# Patient Record
Sex: Female | Born: 1965 | Race: White | Hispanic: No | Marital: Married | Smoking: Never smoker
Health system: Southern US, Community
[De-identification: ages and names within clinical notes are randomized; demographics above are authoritative.]

## PROBLEM LIST (undated history)

## (undated) DIAGNOSIS — R112 Nausea with vomiting, unspecified: Secondary | ICD-10-CM

## (undated) DIAGNOSIS — M5030 Other cervical disc degeneration, unspecified cervical region: Secondary | ICD-10-CM

## (undated) DIAGNOSIS — F32A Depression, unspecified: Secondary | ICD-10-CM

## (undated) DIAGNOSIS — K219 Gastro-esophageal reflux disease without esophagitis: Secondary | ICD-10-CM

## (undated) DIAGNOSIS — K863 Pseudocyst of pancreas: Secondary | ICD-10-CM

## (undated) DIAGNOSIS — K5900 Constipation, unspecified: Secondary | ICD-10-CM

## (undated) DIAGNOSIS — N3010 Interstitial cystitis (chronic) without hematuria: Secondary | ICD-10-CM

## (undated) DIAGNOSIS — N390 Urinary tract infection, site not specified: Secondary | ICD-10-CM

## (undated) DIAGNOSIS — S99191A Other physeal fracture of right metatarsal, initial encounter for closed fracture: Secondary | ICD-10-CM

## (undated) DIAGNOSIS — J40 Bronchitis, not specified as acute or chronic: Secondary | ICD-10-CM

## (undated) DIAGNOSIS — Z87442 Personal history of urinary calculi: Secondary | ICD-10-CM

## (undated) DIAGNOSIS — S8262XA Displaced fracture of lateral malleolus of left fibula, initial encounter for closed fracture: Secondary | ICD-10-CM

## (undated) DIAGNOSIS — N201 Calculus of ureter: Secondary | ICD-10-CM

## (undated) DIAGNOSIS — R109 Unspecified abdominal pain: Secondary | ICD-10-CM

## (undated) DIAGNOSIS — M542 Cervicalgia: Secondary | ICD-10-CM

## (undated) DIAGNOSIS — K589 Irritable bowel syndrome without diarrhea: Secondary | ICD-10-CM

## (undated) DIAGNOSIS — R110 Nausea: Secondary | ICD-10-CM

## (undated) DIAGNOSIS — M25561 Pain in right knee: Secondary | ICD-10-CM

## (undated) DIAGNOSIS — F329 Major depressive disorder, single episode, unspecified: Secondary | ICD-10-CM

## (undated) DIAGNOSIS — D649 Anemia, unspecified: Secondary | ICD-10-CM

## (undated) DIAGNOSIS — E039 Hypothyroidism, unspecified: Secondary | ICD-10-CM

## (undated) DIAGNOSIS — F419 Anxiety disorder, unspecified: Secondary | ICD-10-CM

## (undated) DIAGNOSIS — K861 Other chronic pancreatitis: Secondary | ICD-10-CM

## (undated) DIAGNOSIS — R0600 Dyspnea, unspecified: Secondary | ICD-10-CM

## (undated) DIAGNOSIS — Z8719 Personal history of other diseases of the digestive system: Secondary | ICD-10-CM

## (undated) DIAGNOSIS — J189 Pneumonia, unspecified organism: Secondary | ICD-10-CM

## (undated) DIAGNOSIS — M25461 Effusion, right knee: Secondary | ICD-10-CM

## (undated) DIAGNOSIS — Z9889 Other specified postprocedural states: Secondary | ICD-10-CM

## (undated) HISTORY — PX: JOINT REPLACEMENT: SHX530

## (undated) HISTORY — DX: Depression, unspecified: F32.A

## (undated) HISTORY — DX: Major depressive disorder, single episode, unspecified: F32.9

## (undated) HISTORY — PX: OTHER SURGICAL HISTORY: SHX169

## (undated) HISTORY — PX: TONSILLECTOMY: SUR1361

## (undated) HISTORY — PX: ABDOMINAL HYSTERECTOMY: SHX81

## (undated) HISTORY — DX: Gastro-esophageal reflux disease without esophagitis: K21.9

## (undated) HISTORY — PX: HERNIA REPAIR: SHX51

---

## 1990-03-13 HISTORY — PX: DIAGNOSTIC LAPAROSCOPY: SUR761

## 1995-03-14 HISTORY — PX: LAPAROSCOPIC ASSISTED VAGINAL HYSTERECTOMY: SHX5398

## 1997-03-13 HISTORY — PX: CERVICAL FUSION: SHX112

## 1997-09-29 ENCOUNTER — Encounter: Admission: RE | Admit: 1997-09-29 | Discharge: 1997-12-28 | Payer: Self-pay | Admitting: Family Medicine

## 1997-10-21 ENCOUNTER — Ambulatory Visit (HOSPITAL_COMMUNITY): Admission: RE | Admit: 1997-10-21 | Discharge: 1997-10-21 | Payer: Self-pay | Admitting: Neurosurgery

## 1997-10-30 ENCOUNTER — Encounter: Payer: Self-pay | Admitting: Neurosurgery

## 1997-10-30 ENCOUNTER — Ambulatory Visit (HOSPITAL_COMMUNITY): Admission: RE | Admit: 1997-10-30 | Discharge: 1997-10-30 | Payer: Self-pay | Admitting: Neurosurgery

## 1997-11-18 ENCOUNTER — Ambulatory Visit (HOSPITAL_COMMUNITY): Admission: RE | Admit: 1997-11-18 | Discharge: 1997-11-18 | Payer: Self-pay | Admitting: Neurosurgery

## 1997-11-18 ENCOUNTER — Encounter: Payer: Self-pay | Admitting: Neurosurgery

## 1998-03-21 ENCOUNTER — Encounter: Payer: Self-pay | Admitting: Emergency Medicine

## 1998-03-21 ENCOUNTER — Emergency Department (HOSPITAL_COMMUNITY): Admission: EM | Admit: 1998-03-21 | Discharge: 1998-03-21 | Payer: Self-pay | Admitting: Emergency Medicine

## 1998-03-24 ENCOUNTER — Encounter: Payer: Self-pay | Admitting: Urology

## 1998-03-24 ENCOUNTER — Ambulatory Visit (HOSPITAL_COMMUNITY): Admission: RE | Admit: 1998-03-24 | Discharge: 1998-03-24 | Payer: Self-pay | Admitting: Urology

## 1998-06-23 ENCOUNTER — Ambulatory Visit (HOSPITAL_COMMUNITY): Admission: RE | Admit: 1998-06-23 | Discharge: 1998-06-23 | Payer: Self-pay | Admitting: Gastroenterology

## 1998-09-09 ENCOUNTER — Ambulatory Visit (HOSPITAL_COMMUNITY): Admission: RE | Admit: 1998-09-09 | Discharge: 1998-09-09 | Payer: Self-pay | Admitting: Obstetrics and Gynecology

## 1999-05-13 ENCOUNTER — Encounter: Payer: Self-pay | Admitting: Family Medicine

## 1999-05-13 ENCOUNTER — Ambulatory Visit (HOSPITAL_COMMUNITY): Admission: RE | Admit: 1999-05-13 | Discharge: 1999-05-13 | Payer: Self-pay | Admitting: Family Medicine

## 1999-06-16 ENCOUNTER — Encounter: Payer: Self-pay | Admitting: Family Medicine

## 1999-06-16 ENCOUNTER — Encounter: Admission: RE | Admit: 1999-06-16 | Discharge: 1999-06-16 | Payer: Self-pay | Admitting: Family Medicine

## 1999-08-19 ENCOUNTER — Encounter: Admission: RE | Admit: 1999-08-19 | Discharge: 1999-08-19 | Payer: Self-pay | Admitting: Family Medicine

## 1999-08-19 ENCOUNTER — Encounter: Payer: Self-pay | Admitting: Family Medicine

## 2000-05-02 ENCOUNTER — Emergency Department (HOSPITAL_COMMUNITY): Admission: EM | Admit: 2000-05-02 | Discharge: 2000-05-02 | Payer: Self-pay | Admitting: Emergency Medicine

## 2000-05-02 ENCOUNTER — Encounter: Payer: Self-pay | Admitting: Emergency Medicine

## 2002-10-18 ENCOUNTER — Emergency Department (HOSPITAL_COMMUNITY): Admission: AC | Admit: 2002-10-18 | Discharge: 2002-10-18 | Payer: Self-pay | Admitting: Emergency Medicine

## 2002-10-18 ENCOUNTER — Encounter: Payer: Self-pay | Admitting: Emergency Medicine

## 2003-07-08 ENCOUNTER — Encounter: Admission: RE | Admit: 2003-07-08 | Discharge: 2003-07-08 | Payer: Self-pay | Admitting: Family Medicine

## 2003-12-01 ENCOUNTER — Encounter: Admission: RE | Admit: 2003-12-01 | Discharge: 2003-12-01 | Payer: Self-pay | Admitting: Family Medicine

## 2009-03-03 ENCOUNTER — Encounter: Admission: RE | Admit: 2009-03-03 | Discharge: 2009-03-03 | Payer: Self-pay | Admitting: Advanced Practice Midwife

## 2009-06-11 HISTORY — PX: CERVICAL SPINE SURGERY: SHX589

## 2009-09-24 ENCOUNTER — Ambulatory Visit (HOSPITAL_BASED_OUTPATIENT_CLINIC_OR_DEPARTMENT_OTHER): Admission: RE | Admit: 2009-09-24 | Discharge: 2009-09-24 | Payer: Self-pay | Admitting: Urology

## 2009-09-24 HISTORY — PX: OTHER SURGICAL HISTORY: SHX169

## 2009-12-21 ENCOUNTER — Encounter (INDEPENDENT_AMBULATORY_CARE_PROVIDER_SITE_OTHER): Payer: Self-pay | Admitting: Obstetrics and Gynecology

## 2009-12-21 ENCOUNTER — Ambulatory Visit (HOSPITAL_COMMUNITY): Admission: RE | Admit: 2009-12-21 | Discharge: 2009-12-21 | Payer: Self-pay | Admitting: Obstetrics and Gynecology

## 2009-12-21 HISTORY — PX: OTHER SURGICAL HISTORY: SHX169

## 2010-02-07 ENCOUNTER — Encounter: Admission: RE | Admit: 2010-02-07 | Discharge: 2010-02-07 | Payer: Self-pay | Admitting: Obstetrics and Gynecology

## 2010-03-13 HISTORY — PX: BACK SURGERY: SHX140

## 2010-05-16 ENCOUNTER — Other Ambulatory Visit: Payer: Self-pay | Admitting: Orthopedic Surgery

## 2010-05-19 ENCOUNTER — Ambulatory Visit
Admission: RE | Admit: 2010-05-19 | Discharge: 2010-05-19 | Disposition: A | Discharge status: Home / Self Care | Payer: Managed Care, Other (non HMO) | Source: Ambulatory Visit | Attending: Orthopedic Surgery | Admitting: Orthopedic Surgery

## 2010-05-26 LAB — BASIC METABOLIC PANEL
BUN: 14 mg/dL (ref 6–23)
CO2: 27 mEq/L (ref 19–32)
Calcium: 9.3 mg/dL (ref 8.4–10.5)
Creatinine, Ser: 0.86 mg/dL (ref 0.4–1.2)
GFR calc non Af Amer: >60 mL/min (ref >60)
Glucose, Bld: 95 mg/dL (ref 70–99)

## 2010-05-26 LAB — CBC
Hemoglobin: 12 g/dL (ref 12.0–15.0)
MCH: 27.3 pg (ref 26.0–34.0)
MCHC: 32.9 g/dL (ref 30.0–36.0)

## 2010-05-26 LAB — SURGICAL PCR SCREEN: Staphylococcus aureus: NEGATIVE

## 2010-05-28 LAB — POCT HEMOGLOBIN-HEMACUE: Hemoglobin: 11.3 g/dL — ABNORMAL LOW (ref 12.0–15.0)

## 2010-07-14 ENCOUNTER — Other Ambulatory Visit: Payer: Self-pay | Admitting: Orthopedic Surgery

## 2010-07-14 DIAGNOSIS — M542 Cervicalgia: Secondary | ICD-10-CM

## 2010-07-18 ENCOUNTER — Ambulatory Visit
Admission: RE | Admit: 2010-07-18 | Discharge: 2010-07-18 | Disposition: A | Discharge status: Home / Self Care | Payer: Managed Care, Other (non HMO) | Source: Ambulatory Visit | Attending: Orthopedic Surgery | Admitting: Orthopedic Surgery

## 2010-07-18 DIAGNOSIS — M542 Cervicalgia: Secondary | ICD-10-CM

## 2010-10-28 ENCOUNTER — Encounter (HOSPITAL_COMMUNITY)
Admission: RE | Admit: 2010-10-28 | Discharge: 2010-10-28 | Disposition: A | Discharge status: Home / Self Care | Payer: 59 | Source: Ambulatory Visit | Attending: Orthopedic Surgery | Admitting: Orthopedic Surgery

## 2010-10-28 ENCOUNTER — Other Ambulatory Visit (HOSPITAL_COMMUNITY): Payer: Self-pay | Admitting: Orthopedic Surgery

## 2010-10-28 DIAGNOSIS — M545 Low back pain: Secondary | ICD-10-CM

## 2010-10-28 LAB — CBC
Hemoglobin: 11.7 g/dL — ABNORMAL LOW (ref 12.0–15.0)
MCH: 26.7 pg (ref 26.0–34.0)
MCV: 83.3 fL (ref 78.0–100.0)
RBC: 4.38 MIL/uL (ref 3.87–5.11)

## 2010-10-28 LAB — BASIC METABOLIC PANEL
BUN: 16 mg/dL (ref 6–23)
CO2: 30 mEq/L (ref 19–32)
GFR calc Af Amer: >60 mL/min (ref >60)
Potassium: 4.3 mEq/L (ref 3.5–5.1)
Sodium: 138 mEq/L (ref 135–145)

## 2010-10-28 LAB — SURGICAL PCR SCREEN: Staphylococcus aureus: NEGATIVE

## 2010-10-29 LAB — TYPE AND SCREEN
ABO/RH(D): B POS
Antibody Screen: NEGATIVE

## 2010-11-09 ENCOUNTER — Inpatient Hospital Stay (HOSPITAL_COMMUNITY): Payer: 59

## 2010-11-09 ENCOUNTER — Inpatient Hospital Stay (HOSPITAL_COMMUNITY)
Admission: RE | Admit: 2010-11-09 | Discharge: 2010-11-12 | DRG: 460 | Disposition: A | Discharge status: Home Health | Payer: 59 | Source: Ambulatory Visit | Attending: Orthopedic Surgery | Admitting: Orthopedic Surgery

## 2010-11-09 DIAGNOSIS — E039 Hypothyroidism, unspecified: Secondary | ICD-10-CM | POA: Diagnosis present

## 2010-11-09 DIAGNOSIS — E669 Obesity, unspecified: Secondary | ICD-10-CM | POA: Diagnosis present

## 2010-11-09 DIAGNOSIS — M4310 Spondylolisthesis, site unspecified: Secondary | ICD-10-CM

## 2010-11-09 DIAGNOSIS — M51379 Other intervertebral disc degeneration, lumbosacral region without mention of lumbar back pain or lower extremity pain: Principal | ICD-10-CM | POA: Diagnosis present

## 2010-11-09 DIAGNOSIS — M5137 Other intervertebral disc degeneration, lumbosacral region: Principal | ICD-10-CM | POA: Diagnosis present

## 2010-11-09 DIAGNOSIS — F341 Dysthymic disorder: Secondary | ICD-10-CM | POA: Diagnosis present

## 2010-11-09 DIAGNOSIS — Z01818 Encounter for other preprocedural examination: Secondary | ICD-10-CM

## 2010-11-09 DIAGNOSIS — Z01812 Encounter for preprocedural laboratory examination: Secondary | ICD-10-CM

## 2010-11-09 DIAGNOSIS — Q762 Congenital spondylolisthesis: Secondary | ICD-10-CM

## 2010-11-09 HISTORY — PX: ANTERIOR LUMBAR FUSION: SHX1170

## 2010-11-10 ENCOUNTER — Inpatient Hospital Stay (HOSPITAL_COMMUNITY): Payer: 59

## 2010-11-10 DIAGNOSIS — M79609 Pain in unspecified limb: Secondary | ICD-10-CM

## 2010-11-11 NOTE — Op Note (Signed)
NAMESONNI, BARSE               ACCOUNT NO.:  [id number]  MEDICAL RECORD NO.:  [id number]  LOCATION:  2550                         FACILITY:  [facility]  PHYSICIAN:  Alvy Beal, MD    DATE OF BIRTH:  Jul 31, 1965  DATE OF PROCEDURE:  11/09/2010 DATE OF DISCHARGE:                              OPERATIVE REPORT   PREOPERATIVE DIAGNOSIS:  Lumbar spondylolisthesis L5-S1 with degenerative disk disease.  POSTOPERATIVE DIAGNOSIS:  Lumbar spondylolisthesis L5-S1 with degenerative disk disease.  OPERATIVE PROCEDURE:  Anterior lumbar interbody fusion L5-S1.  INSTRUMENTATION SYSTEM USED:  RSV inner plate which is 14 large pole of the lordotic spacer packed with Cronos and PBX with appropriate-sized anterior 0 profile plate fixed with standard 20 mm lag screws and locking plate.  COMPLICATIONS:  None.  DISPOSITION:  Stable.  APPROACH SURGEON: 1. Charlena Cross, MD  FIRST ASSISTANT:  Norval Gable, PA  This is a very pleasant 45 year old woman with longstanding significant low back, buttock, and bilateral leg pain, right side worse than the left.  Despite appropriate prolonged conservative management, injection therapy, physiotherapy, analgesics, activity modification, she continued to have severe disabling low back pain, loss of quality of life.  As a result, we elected to proceed with aforementioned surgery.  All appropriate risks, benefits, and alternatives were discussed with the patient.  Consent was obtained.  OPERATIVE NOTE:  The patient was brought to the operating room, placed supine on the operating table.  After successful induction of general anesthesia and endotracheal intubation, TEDs, SCDs, and Foley were inserted.  The patient was placed supine on the [place] table.  All bony prominences were well-padded.  Abdomen was prepped and draped in standard fashion.  Dr. Durene Cal then did standard anterior approach, retroperitoneal approach to the lumbar spine.   Once the Orient blades were placed and the vasculature was retracted and the L5-S1 disk was exposed, I then scrubbed into the case.  A marker needle was placed at the L5-S1 disk. I confirmed the appropriate level.  Once this was done, I then incised the annulus with 10-blade scalpel.  Using a combination of pituitary rongeurs, curettes, and Kerrison rongeurs, I eventually performed a complete and thorough diskectomy.  There was significant posterior bone spur at L5 such that there was a retrolisthesis of L5 with respect to S1.  I was able to release the annulus from the posterior aspect of S1. I then took a 2-mm Kerrison underneath the posterior bone spur of L5 and annulus and released the annulus and removed the bone spur completely decompressing centrally and in the lateral gutters.  At this point, I had an excellent decompression.  I could visualize the remaining fragments of the posterior longitudinal ligament and annulus.  I cleared out laterally and I had to address the retrolisthesis.  I then rasped the endplates and then trialed interbody spacers.  Once I had the appropriate sized trial, I then aspirated 5 mL of blood from the S1 vertebral body and then packed the graft with Cronos mixed with PBX. This was then [facility] to the appropriate depth and then the zero profile plate was attached and secured with three screws and a locking plate according  to [employer] specifications.  At this point, I irrigated the wound copiously with normal saline and placed FloSeal, then used bipolar electrocautery to obtain hemostasis.  FloSeal was used to help maintain.  I sequentially removed the retractors and there was no significant bleeding noted.  I then closed the rectus sheath with #1 PDS running stitch and then a two-layer 2-0 Vicryl suture closure in an interrupted simple and 3-0 Monocryl for the skin.  Steri-Strips and dry dressing applied. Intraoperative AP x-ray of the abdomen  showed there was no surgical materials left in the wound other than the hardware.  Final x-rays were satisfactory.  At the end of the case, all needle and sponge counts were correct.  The patient was ultimately extubated, transferred to the PACU without incident.  It should be noted that prior to the onset of the case, we did do an appropriate time-out and all pertinent port data was confirmed.     Alvy Beal, MD     DDB/MEDQ  D:  11/09/2010  T:  11/09/2010  Job:  [id number]  cc:   Jorge Ny, MD  Electronically Signed by Venita Lick MD on 11/11/2010 03:51:08 PM

## 2010-12-10 NOTE — Op Note (Signed)
  [name], [name]               ACCOUNT NO.:  [id number]  MEDICAL RECORD NO.:  [id number]  LOCATION:  5035                         FACILITY:  [facility]  PHYSICIAN:  Juleen China IV, MDDATE OF BIRTH:  [birth year]  DATE OF PROCEDURE:  11/09/2010 DATE OF DISCHARGE:  11/12/2010                              OPERATIVE REPORT   PREOPERATIVE DIAGNOSIS:  Back pain.  POSTOPERATIVE DIAGNOSIS:  Back pain.  PROCEDURE PERFORMED:  Anterior exposure of the L5-S1 disk space.  SURGEON: 1. Charlena Cross, MD  ASSISTANTS:  Norval Gable, PA, Alvy Beal, MD  ANESTHESIA:  General.  BLOOD LOSS:  See anesthesia record.  COMPLICATIONS:  None.  INDICATIONS:  This is a 45 year old female seen preoperatively by Dr. Shon Baton and deemed to be a candidate for anterior exposure and instrumentation.  I met the patient in the holding area.  We discussed the risks and benefits of the anterior approach including the risk of injury to the artery, nerve and ureter.  She understands these and wishes to proceed.  PROCEDURE:  The patient was identified in the holding area and taken to room 5 and placed supine on the table.  General endotracheal anesthesia was administered.  The patient was prepped and draped in usual fashion. A time-out was called.  Antibiotics were given.  A C-arm was used to identify the appropriate level of the incision.  A transverse left lower quadrant incision was made.  Cautery was used to divide the subcutaneous tissue down to the anterior abdominal wall fascia.  The fascia was then opened with cautery.  Subfascial flaps were then developed.  The medial and lateral border of the rectus were mobilized.  I initially entered the retroperitoneal space from the lateral side of the rectus.  Once I had developed a good plane, I came from the medial side of the rectus. The abdominal contents were then swept medially and superiorly.  The external iliac artery and iliac vein were  identified.  The ureter was also visualized and swept laterally and protected.  The vein was fully mobilized and iliolumbar branches were divided between silk ties.  The median sacral artery and vein were identified and divided with bipolar cautery.  Once adequate mobilization of the vein was achieved, the [facility] retractor was set into place.  150 reverse lip blades were placed on either side of the spine.  Malleable retractors were used superiorly and inferiorly.  At this point, Dr. Shon Baton came in and performed his portion of the procedure.  Once he was completed, hemostasis was achieved and Dr. Shon Baton closed the incision.     Jorge Ny, MD     VWB/MEDQ  D:  11/15/2010  T:  11/16/2010  Job:  [id number]  Electronically Signed by Arelia Longest IV MD on 12/10/2010 09:56:44 AM

## 2010-12-14 NOTE — Discharge Summary (Signed)
[name], [name] NO.:  [id number]  MEDICAL RECORD NO.:  [id number]  LOCATION:  5035                         FACILITY:  [facility]  PHYSICIAN:  Alvy Beal, MD    DATE OF BIRTH:  Nov 05, 1965  DATE OF ADMISSION:  11/09/2010 DATE OF DISCHARGE:  11/12/2010                              DISCHARGE SUMMARY   ADMISSION DIAGNOSES: 1. Lumbar spondylolisthesis, L5 through S1 with degenerative disk     disease. 2. Gastroesophageal reflux disease. 3. Kidney stones. 4. Irritable bowel syndrome. 5. Depression/anxiety. 6. Hypothyroidism. 7. Endometriosis.  DISCHARGE DIAGNOSES: 1. Lumbar spondylolisthesis, L5-S1 with degenerative disk disease     status post anterior lumbar interbody fusion, L5-S1. 2. Gastroesophageal reflux disease. 3. Kidney stones. 4. Irritable bowel syndrome. 5. Depression/anxiety. 6. Hypothyroidism. 7. Endometriosis.  OPERATIVE PROCEDURE:  Anterior lumbar interbody fusion, L5-S1.  APPROACHED SURGEON: 1. Charlena Cross, MD  SURGEON:  Alvy Beal, MD  FIRST ASSISTANT:  Norval Gable, PA  COMPLICATIONS:  None.  CONSULTS:  None.  BRIEF HISTORY:  [name] is a pleasant 45 year old female with longstanding horrific low back, buttock, and bilateral leg pain, right side greater than left.  Despite appropriate conservative management which includes injection therapy, physiotherapy, analgesics, activity modification, and observation, she continued to have severe horrific disabling low back pain and loss of quality of life.  Because of the ongoing nature of her pain, she elected to proceed with surgery.  HOSPITAL COURSE:  On November 09, 2010, the patient was admitted to [facility] and underwent the above procedure without complication. She was transferred to the PACU and subsequently to the Orthopedic Floor in stable condition..  On postop day #1, the patient was doing well.  She complained only of incisional pain.  She was  afebrile and her vital signs were stable.  On exam, her neurovascular status was intact.  She has 5/5 strength in lower extremities and her sensation was intact to light touch.  The incision was clean, dry, and intact.  Her compartments were soft and nontender.  There was no shortness of breath or chest pain.  Her abdomen was soft and nontender.  X-rays were reviewed by Dr. Shon Baton and were satisfactory.  Bilateral lower extremity venous Dopplers were completed and there was no evidence of DVT.  On postop day #2, the patient was doing well, however, she still complained of incisional pain only.  She was tolerating a regular diet. She had not yet had a bowel movement, however, she was passing gas and urinating independently.  Her clinical exam was unchanged from the day before.  She continued to make progress with physical therapy and occupational therapy.  On postop day #3, the patient was doing well.  She was afebrile.  Her vital signs were stable.  She was moving her bowels on her own.  She is tolerating a regular diet.  Her pain was controlled with oral pain medications.  She had been cleared by Physical Therapy and Occupational Therapy and was therefore discharged home.  DISCHARGE PLAN:  Discharged home on November 12, 2010, with [facility].  ACTIVITY:  Walk with assistance with the use of a walker.  DIET:  Regular.  WOUND CARE:  Keep wound clean and dry until followup with Dr. Shon Baton.  DISCHARGE MEDICATIONS:  The patient would continue her same home medications including: 1. Bupropion XL 300 mg 1 tablet daily. 2. Cymbalta 60 mg 1 capsule p.o. daily. 3. Estradiol gel 0.06%, apply to the arm 1 pump topically daily. 4. Flomax 0.4 mg 1 capsule by mouth daily. 5. Gabapentin 100 mg 2 capsules by mouth 3 times a day. 6. Hydrochlorothiazide 25 mg 1 tablet by mouth daily. 7. Hyoscyamine 0.125 mg 1 tablet by mouth p.r.n. 8. Levothyroxine 100 mcg 1 tablet by mouth daily. 9.  Loratadine 10 mg 1 tablet by mouth daily. 10.Potassium citrate 10 mEq 1 tablet by mouth b.i.d. 11.Xanax 1 mg 1 tablet by mouth 4 times a day as needed for anxiety. 12.Zantac 150 mg 1 tablet by mouth 3 times a day.  New medications started at the time of discharge include: 1. Colace 100 mg 1 tablet by mouth twice a day. 2. Lovenox 40 mg subcutaneously for 10 days and then stop. 3. Milk of Magnesia 30 mL p.o. q.6 p.r.n. 4. MiraLax 17 g by mouth as needed for constipation. 5. Percocet 10/325 mg 1 by mouth every 4-6 hours p.r.n. pain. 6. Robaxin 500 mg 1 p.o. t.i.d. 7. Zofran 4 mg 1 p.o. q.6 hours p.r.n. nausea.  The patient was instructed to stop the following medications: 1. Flexeril 10 mg 1 tablet p.o. t.i.d. 2. Oxycodone/acetaminophen 5/325 one tablet by mouth every 6 hours     p.r.n. pain. 3. Indomethacin 25 mg 2 tablets by mouth twice daily. 4. Voltaren 75 mg 1 tablet by mouth daily.  The patient was provided with discharge instructions at the time of discharge.  All of her questions were encouraged, addressed, and answered.  She will follow up with Dr. Shon Baton in 2 weeks' time for suture removal, wound check, and further discussion of the expected recovery course.  She knows to contact [facility] in the interim if anything changes between now and her scheduled postop followup.  The patient's condition at the time of discharge is stable.    ______________________________ Norval Gable, PA   ______________________________ Alvy Beal, MD    DK/MEDQ  D:  11/23/2010  T:  11/23/2010  Job:  [id number]  Electronically Signed by Norval Gable PA on 12/08/2010 02:27:54 PM Electronically Signed by Venita Lick MD on 12/14/2010 08:17:53 AM

## 2010-12-16 ENCOUNTER — Other Ambulatory Visit: Payer: Self-pay | Admitting: Orthopedic Surgery

## 2010-12-16 ENCOUNTER — Other Ambulatory Visit: Payer: Managed Care, Other (non HMO)

## 2010-12-16 DIAGNOSIS — M545 Low back pain: Secondary | ICD-10-CM

## 2010-12-19 ENCOUNTER — Ambulatory Visit
Admission: RE | Admit: 2010-12-19 | Discharge: 2010-12-19 | Disposition: A | Discharge status: Home / Self Care | Payer: Managed Care, Other (non HMO) | Source: Ambulatory Visit | Attending: Orthopedic Surgery | Admitting: Orthopedic Surgery

## 2010-12-19 ENCOUNTER — Other Ambulatory Visit: Payer: Managed Care, Other (non HMO)

## 2010-12-19 DIAGNOSIS — M545 Low back pain: Secondary | ICD-10-CM

## 2010-12-19 MED ORDER — IOHEXOL 300 MG/ML  SOLN
100.0000 mL | Freq: Once | INTRAMUSCULAR | Status: AC | PRN
  Administered 2010-12-19: 100 mL via INTRAVENOUS

## 2011-01-02 ENCOUNTER — Emergency Department (HOSPITAL_COMMUNITY): Payer: Managed Care, Other (non HMO)

## 2011-01-02 ENCOUNTER — Inpatient Hospital Stay (HOSPITAL_COMMUNITY)
Admission: EM | Admit: 2011-01-02 | Discharge: 2011-01-10 | DRG: 417 | Disposition: A | Discharge status: Home / Self Care | Payer: Managed Care, Other (non HMO) | Attending: Internal Medicine | Admitting: Internal Medicine

## 2011-01-02 DIAGNOSIS — M549 Dorsalgia, unspecified: Secondary | ICD-10-CM | POA: Diagnosis present

## 2011-01-02 DIAGNOSIS — D649 Anemia, unspecified: Secondary | ICD-10-CM | POA: Diagnosis present

## 2011-01-02 DIAGNOSIS — E039 Hypothyroidism, unspecified: Secondary | ICD-10-CM | POA: Diagnosis present

## 2011-01-02 DIAGNOSIS — E785 Hyperlipidemia, unspecified: Secondary | ICD-10-CM | POA: Diagnosis present

## 2011-01-02 DIAGNOSIS — K859 Acute pancreatitis without necrosis or infection, unspecified: Secondary | ICD-10-CM | POA: Diagnosis present

## 2011-01-02 DIAGNOSIS — N200 Calculus of kidney: Secondary | ICD-10-CM | POA: Diagnosis present

## 2011-01-02 DIAGNOSIS — I10 Essential (primary) hypertension: Secondary | ICD-10-CM | POA: Diagnosis present

## 2011-01-02 DIAGNOSIS — IMO0002 Reserved for concepts with insufficient information to code with codable children: Secondary | ICD-10-CM | POA: Diagnosis not present

## 2011-01-02 DIAGNOSIS — F19921 Other psychoactive substance use, unspecified with intoxication with delirium: Secondary | ICD-10-CM | POA: Diagnosis not present

## 2011-01-02 DIAGNOSIS — R4182 Altered mental status, unspecified: Secondary | ICD-10-CM | POA: Diagnosis not present

## 2011-01-02 DIAGNOSIS — T398X5A Adverse effect of other nonopioid analgesics and antipyretics, not elsewhere classified, initial encounter: Secondary | ICD-10-CM | POA: Diagnosis not present

## 2011-01-02 DIAGNOSIS — R748 Abnormal levels of other serum enzymes: Secondary | ICD-10-CM | POA: Diagnosis present

## 2011-01-02 DIAGNOSIS — F3289 Other specified depressive episodes: Secondary | ICD-10-CM | POA: Diagnosis present

## 2011-01-02 DIAGNOSIS — G8929 Other chronic pain: Secondary | ICD-10-CM | POA: Diagnosis present

## 2011-01-02 DIAGNOSIS — E669 Obesity, unspecified: Secondary | ICD-10-CM | POA: Diagnosis present

## 2011-01-02 DIAGNOSIS — K8020 Calculus of gallbladder without cholecystitis without obstruction: Principal | ICD-10-CM | POA: Diagnosis present

## 2011-01-02 DIAGNOSIS — F329 Major depressive disorder, single episode, unspecified: Secondary | ICD-10-CM | POA: Diagnosis present

## 2011-01-02 DIAGNOSIS — K838 Other specified diseases of biliary tract: Secondary | ICD-10-CM | POA: Diagnosis present

## 2011-01-02 DIAGNOSIS — F411 Generalized anxiety disorder: Secondary | ICD-10-CM | POA: Diagnosis present

## 2011-01-02 DIAGNOSIS — R109 Unspecified abdominal pain: Secondary | ICD-10-CM | POA: Diagnosis present

## 2011-01-02 LAB — DIFFERENTIAL
Eosinophils Absolute: 0 10*3/uL (ref 0.0–0.7)
Lymphocytes Relative: 5 % — ABNORMAL LOW (ref 12–46)
Lymphs Abs: 0.8 10*3/uL (ref 0.7–4.0)
Monocytes Relative: 2 % — ABNORMAL LOW (ref 3–12)
Neutrophils Relative %: 93 % — ABNORMAL HIGH (ref 43–77)

## 2011-01-02 LAB — BASIC METABOLIC PANEL
Calcium: 9.4 mg/dL (ref 8.4–10.5)
GFR calc Af Amer: >90 mL/min (ref >90)
GFR calc non Af Amer: >90 mL/min (ref >90)
Potassium: 3.3 mEq/L — ABNORMAL LOW (ref 3.5–5.1)
Sodium: 137 mEq/L (ref 135–145)

## 2011-01-02 LAB — URINALYSIS, ROUTINE W REFLEX MICROSCOPIC
Nitrite: NEGATIVE
Protein, ur: NEGATIVE mg/dL
Specific Gravity, Urine: 1.017 (ref 1.005–1.030)
Urobilinogen, UA: 0.2 mg/dL (ref 0.0–1.0)

## 2011-01-02 LAB — CBC
HCT: 41.3 % (ref 36.0–46.0)
Hemoglobin: 13.5 g/dL (ref 12.0–15.0)
MCH: 25.7 pg — ABNORMAL LOW (ref 26.0–34.0)
MCV: 78.7 fL (ref 78.0–100.0)
RBC: 5.25 MIL/uL — ABNORMAL HIGH (ref 3.87–5.11)
WBC: 16.1 10*3/uL — ABNORMAL HIGH (ref 4.0–10.5)

## 2011-01-02 LAB — GLUCOSE, CAPILLARY: Glucose-Capillary: 101 mg/dL — ABNORMAL HIGH (ref 70–99)

## 2011-01-02 LAB — HEPATIC FUNCTION PANEL
AST: 593 U/L — ABNORMAL HIGH (ref 0–37)
Albumin: 4.1 g/dL (ref 3.5–5.2)
Bilirubin, Direct: 0.3 mg/dL (ref 0.0–0.3)

## 2011-01-02 LAB — LIPASE, BLOOD: Lipase: >3000 U/L — ABNORMAL HIGH (ref 11–59)

## 2011-01-02 NOTE — H&P (Unsigned)
NAMECEARRA, PORTNOY NO.:  [id number]  MEDICAL RECORD NO.:  [id number]  LOCATION:  WLED                         FACILITY:  [facility]  PHYSICIAN:  Valetta Close, M.D.   DATE OF BIRTH:  [birth year]  DATE OF ADMISSION:  01/02/2011 DATE OF DISCHARGE:                             HISTORY & PHYSICAL   CHIEF COMPLAINT:  Abdominal pain.  HISTORY OF PRESENT ILLNESS:  This is a 45 year old female with a past medical history of multiple back and neck surgeries and nephrolithiasis who over the last 3 days has been somewhat sleepiness, had a decreased appetite.  Of note, she recently had a back surgery where she just completed an oral prednisone taper on Saturday.  She says she has been feeling kind of unwell since may be in the last 3 days which would be again Saturday.  She thought that her sleepiness was secondary to her increased exercise after the back surgery.  She also had a decreased appetite, but this morning at around 4:00 in the morning, she developed watery diarrhea.  This persisted and was associated with abdominal pain. The pain was worse in the middle to right upper quadrant, but it did radiate around her entire abdomen.  She thought it would get better with the diarrhea, but it did not and it started becoming unbearable at around 11 o'clock when she was on her way for a doctor's appointment. When she got out of her car, the pain in her abdomen was simply too excruciating and she felt very hot and had chills with this pain, and because of this, she presents to the ER were she was found to have a pancreatitis on CT scan with a lipase of greater than 3000.  PAST MEDICAL HISTORY: 1. IBS with depression and anxiety. 2. Back pain with neck surgeries and recent back surgeries.  She has     had multiple surgeries. 3. Hypothyroidism. 4. Nephrolithiasis. 5. Endometriosis for which she has had a hysterectomy and an     oophorectomy.  ALLERGIES:  She has an allergy to  [facility].  She is married.  No alcohol.  No tobacco.  No drugs.  She has a family history of lung cancer, coronary artery disease, anxiety and depression.  She recently had an increase in her Neurontin and was taken off oxycodone and OxyContin and switched to Percocet.  She is a full code.  PHYSICAL EXAMINATION:  VITAL SIGNS:  Temperature 97.3, heart rate 83, blood pressure 132/108, respiratory rate 22, O2 sat 100% on room air. GENERAL:  She is very uncomfortable appearing. HEENT:  Her eyes are anicteric.  She has moist oral mucosa. LUNGS:  Clear to auscultation bilaterally. CARDIAC:  Regular rate and rhythm.  No murmur. ABDOMEN:  Bowel sounds are positive.  She has marked right upper quadrant pain and what I would call a Murphy sign.  She also has moderate pain throughout her abdomen, worse in the middle than on the left. EXTREMITIES:  No edema. NEUROLOGIC:  Alert and oriented x3.  Cranial nerves II through XII intact.  Affect is appropriate.  LABORATORY DATA:  Lipase is 3000.  White count 16, hemoglobin 14, hematocrit 41, platelets 349, MCV  79, ANC 15.  Sodium 137, potassium 3.3, chloride 102, bicarb 20, BUN 12, creatinine 0.7, glucose 132, calcium 9.4, total protein 7.5 and 4.1, AST is 593, ALT is 468, alk phos is 110, bilirubin 0.7.  A CT scan shows acute pancreatitis with a mild gallbladder distention, but no obvious signs of cholecystitis.  There are also no signs of any fluid to aspirate.  ASSESSMENT AND PLAN: 1. Severe pancreatitis with systemic inflammatory response syndrome.     She meets criteria with a respiratory rate greater than 20 and a     white count greater than 12.  I am going to hold off on antibiotics     and I see no signs of pancreatic necrosis, but I will hydrate     aggressively.  I will rule out hypertriglyceridemia as the cause.     I also think gallstones might be possible given that she has a     positive Murphy sign, so we will get an MRCP  tomorrow.  I do not     think she would tolerate a right upper quadrant ultrasound or a     HIDA scan because of the pain in her right upper quadrant.  I am     going to give her aggressive pain control, nausea control as well.     We will check an LDH and a lactic acid and procalcitonin tomorrow     in addition to aggressive hydration and supportive care.  It is     possible that this could be related to her steroids, though I have     never personally seen steroids associated with the pancreatitis, it     is in the literature.  She is also in the time frame for adrenal     crisis given that she has recently been on steroids, but these     symptoms simply do not correlate with that, and I am not sure that     adrenal insufficiency will cause the pancreatitis, it will cause     the lethargy and sleepiness and decreased appetite, but not the     pancreatitis.  I am going to hold off giving steroids for now     because if I give her IV steroids and steroids were, in fact, the     culprit for the pancreatitis, that would make it worse, so I will     hold off on steroids for now and give her aggressive hydration. 2. Hepatitis.  Two questions, whether this is reactive to the     pancreatitis or if the pancreatitis is the cause of     choledocholithiasis with associated hepatitis.  I will recheck     tomorrow CMP and get the MRCP and follow.  I will treat with     supportive care. 3. Abdominal pain.  This is reactive. 4. Leukocytosis.  I suspect this is reactive as well. 5. Anxiety.  I will put her on scheduled and as needed Ativan.  Medications obtained from the ER list include: 1. Neurontin 2 tablets three times a day. 2. Potassium citrate 1 tablet twice a day. 3. Zantac by mouth twice a day. 4. Claritin once a day. 5. Hydrochlorothiazide 25 mg by mouth twice a day. 6. Xanax by mouth three times a day. 7. Wellbutrin by mouth once a day. 8. Methocarbamol by mouth three times a day. 9.  Cymbalta by mouth once a day. 10.Dulcolax as needed for constipation.  Approximate length of  time spent on this admission was approximately 45 minutes.     Valetta Close, M.D.     JC/MEDQ  D:  01/02/2011  T:  01/02/2011  Job:  [id number]  cc:   Dr. Doneen Poisson A. Darrelyn Hillock, M.D. Fax: [phone]  Crist Fat. Rivard, M.D. Fax: [phone]

## 2011-01-03 ENCOUNTER — Inpatient Hospital Stay (HOSPITAL_COMMUNITY): Payer: Managed Care, Other (non HMO)

## 2011-01-03 LAB — CBC
Hemoglobin: 12 g/dL (ref 12.0–15.0)
MCH: 25.3 pg — ABNORMAL LOW (ref 26.0–34.0)
MCHC: 31.4 g/dL (ref 30.0–36.0)
Platelets: 289 10*3/uL (ref 150–400)
RBC: 4.75 MIL/uL (ref 3.87–5.11)

## 2011-01-03 LAB — TSH: TSH: 1.959 u[IU]/mL (ref 0.350–4.500)

## 2011-01-03 LAB — PROCALCITONIN: Procalcitonin: 0.18 ng/mL

## 2011-01-03 LAB — COMPREHENSIVE METABOLIC PANEL
ALT: 277 U/L — ABNORMAL HIGH (ref 0–35)
AST: 166 U/L — ABNORMAL HIGH (ref 0–37)
Albumin: 3.7 g/dL (ref 3.5–5.2)
Alkaline Phosphatase: 96 U/L (ref 39–117)
Calcium: 9.2 mg/dL (ref 8.4–10.5)
GFR calc Af Amer: >90 mL/min (ref >90)
Glucose, Bld: 78 mg/dL (ref 70–99)
Potassium: 3.7 mEq/L (ref 3.5–5.1)
Sodium: 137 mEq/L (ref 135–145)
Total Protein: 6.8 g/dL (ref 6.0–8.3)

## 2011-01-03 LAB — LIPID PANEL
Cholesterol: 215 mg/dL — ABNORMAL HIGH (ref 0–200)
HDL: 53 mg/dL (ref >39)
Total CHOL/HDL Ratio: 4.1 RATIO
Triglycerides: 128 mg/dL (ref <150)

## 2011-01-03 LAB — LIPASE, BLOOD: Lipase: 1577 U/L — ABNORMAL HIGH (ref 11–59)

## 2011-01-03 LAB — GLUCOSE, CAPILLARY: Glucose-Capillary: 96 mg/dL (ref 70–99)

## 2011-01-03 LAB — HEMOGLOBIN A1C
Hgb A1c MFr Bld: 5.4 % (ref <5.7)
Mean Plasma Glucose: 108 mg/dL (ref <117)

## 2011-01-04 DIAGNOSIS — K210 Gastro-esophageal reflux disease with esophagitis: Secondary | ICD-10-CM

## 2011-01-04 DIAGNOSIS — R1013 Epigastric pain: Secondary | ICD-10-CM

## 2011-01-04 DIAGNOSIS — Z8719 Personal history of other diseases of the digestive system: Secondary | ICD-10-CM

## 2011-01-04 HISTORY — DX: Personal history of other diseases of the digestive system: Z87.19

## 2011-01-04 LAB — DIFFERENTIAL
Basophils Absolute: 0 10*3/uL (ref 0.0–0.1)
Basophils Relative: 0 % (ref 0–1)
Lymphocytes Relative: 11 % — ABNORMAL LOW (ref 12–46)
Neutro Abs: 11.7 10*3/uL — ABNORMAL HIGH (ref 1.7–7.7)
Neutrophils Relative %: 81 % — ABNORMAL HIGH (ref 43–77)

## 2011-01-04 LAB — CBC
HCT: 32.7 % — ABNORMAL LOW (ref 36.0–46.0)
Hemoglobin: 10.7 g/dL — ABNORMAL LOW (ref 12.0–15.0)
RBC: 4.13 MIL/uL (ref 3.87–5.11)
WBC: 14.4 10*3/uL — ABNORMAL HIGH (ref 4.0–10.5)

## 2011-01-04 LAB — COMPREHENSIVE METABOLIC PANEL
ALT: 130 U/L — ABNORMAL HIGH (ref 0–35)
Alkaline Phosphatase: 81 U/L (ref 39–117)
BUN: 7 mg/dL (ref 6–23)
CO2: 24 mEq/L (ref 19–32)
Chloride: 99 mEq/L (ref 96–112)
GFR calc Af Amer: >90 mL/min (ref >90)
GFR calc non Af Amer: >90 mL/min (ref >90)
Glucose, Bld: 91 mg/dL (ref 70–99)
Potassium: 3.6 mEq/L (ref 3.5–5.1)
Sodium: 135 mEq/L (ref 135–145)
Total Bilirubin: 0.6 mg/dL (ref 0.3–1.2)
Total Protein: 6.2 g/dL (ref 6.0–8.3)

## 2011-01-04 LAB — GLUCOSE, CAPILLARY
Glucose-Capillary: 83 mg/dL (ref 70–99)
Glucose-Capillary: 82 mg/dL (ref 70–99)

## 2011-01-05 ENCOUNTER — Inpatient Hospital Stay (HOSPITAL_COMMUNITY): Payer: Managed Care, Other (non HMO)

## 2011-01-05 LAB — GLUCOSE, CAPILLARY
Glucose-Capillary: 104 mg/dL — ABNORMAL HIGH (ref 70–99)
Glucose-Capillary: 71 mg/dL (ref 70–99)
Glucose-Capillary: 77 mg/dL (ref 70–99)

## 2011-01-05 LAB — LIPASE, BLOOD: Lipase: 33 U/L (ref 11–59)

## 2011-01-05 LAB — CBC
HCT: 29.6 % — ABNORMAL LOW (ref 36.0–46.0)
MCHC: 32.1 g/dL (ref 30.0–36.0)
Platelets: 293 10*3/uL (ref 150–400)
RDW: 14.6 % (ref 11.5–15.5)

## 2011-01-05 LAB — COMPREHENSIVE METABOLIC PANEL
Albumin: 2.8 g/dL — ABNORMAL LOW (ref 3.5–5.2)
Alkaline Phosphatase: 74 U/L (ref 39–117)
BUN: 8 mg/dL (ref 6–23)
Creatinine, Ser: 0.6 mg/dL (ref 0.50–1.10)
Potassium: 3.7 mEq/L (ref 3.5–5.1)
Total Protein: 5.9 g/dL — ABNORMAL LOW (ref 6.0–8.3)

## 2011-01-05 LAB — HEPATITIS PANEL, ACUTE
HCV Ab: NEGATIVE
Hepatitis B Surface Ag: NEGATIVE

## 2011-01-06 LAB — GLUCOSE, CAPILLARY
Glucose-Capillary: 100 mg/dL — ABNORMAL HIGH (ref 70–99)
Glucose-Capillary: 75 mg/dL (ref 70–99)
Glucose-Capillary: 76 mg/dL (ref 70–99)
Glucose-Capillary: 78 mg/dL (ref 70–99)
Glucose-Capillary: 89 mg/dL (ref 70–99)

## 2011-01-06 LAB — BASIC METABOLIC PANEL
Calcium: 8.5 mg/dL (ref 8.4–10.5)
GFR calc non Af Amer: >90 mL/min (ref >90)
Glucose, Bld: 86 mg/dL (ref 70–99)
Sodium: 140 mEq/L (ref 135–145)

## 2011-01-06 LAB — CBC
MCH: 26.4 pg (ref 26.0–34.0)
MCHC: 32.5 g/dL (ref 30.0–36.0)
Platelets: 275 10*3/uL (ref 150–400)

## 2011-01-06 LAB — LIPASE, BLOOD: Lipase: 33 U/L (ref 11–59)

## 2011-01-07 LAB — GLUCOSE, CAPILLARY
Glucose-Capillary: 78 mg/dL (ref 70–99)
Glucose-Capillary: 81 mg/dL (ref 70–99)
Glucose-Capillary: 82 mg/dL (ref 70–99)
Glucose-Capillary: 89 mg/dL (ref 70–99)

## 2011-01-08 LAB — DIFFERENTIAL
Basophils Absolute: 0 10*3/uL (ref 0.0–0.1)
Basophils Relative: 0 % (ref 0–1)
Eosinophils Absolute: 0.2 10*3/uL (ref 0.0–0.7)
Monocytes Absolute: 0.9 10*3/uL (ref 0.1–1.0)
Monocytes Relative: 9 % (ref 3–12)
Neutro Abs: 7 10*3/uL (ref 1.7–7.7)

## 2011-01-08 LAB — GLUCOSE, CAPILLARY
Glucose-Capillary: 109 mg/dL — ABNORMAL HIGH (ref 70–99)
Glucose-Capillary: 111 mg/dL — ABNORMAL HIGH (ref 70–99)
Glucose-Capillary: 93 mg/dL (ref 70–99)

## 2011-01-08 LAB — CBC
Hemoglobin: 9.1 g/dL — ABNORMAL LOW (ref 12.0–15.0)
MCH: 25.5 pg — ABNORMAL LOW (ref 26.0–34.0)
MCHC: 31.3 g/dL (ref 30.0–36.0)
Platelets: 302 10*3/uL (ref 150–400)
RDW: 14.6 % (ref 11.5–15.5)

## 2011-01-08 NOTE — Consult Note (Signed)
NAMEPERRIS, TRIPATHI NO.:  [id number]  MEDICAL RECORD NO.:  [id number]  LOCATION:  1514                         FACILITY:  [facility]  PHYSICIAN:  Juanetta Gosling, MDDATE OF BIRTH:  January 20, 1966  DATE OF CONSULTATION:  01/04/2011 DATE OF DISCHARGE:                                CONSULTATION   REQUESTING PHYSICIAN:  Dr. Darnelle Catalan.  REASON FOR CONSULT:  Pancreatitis.  PRIMARY CARE:  Cam Hai, C.N.M.  Orthopedic surgeon:  Alvy Beal, MD  BRIEF HISTORY:  The patient is a 45 year old white female who recently underwent surgery, November 09, 2010, for lumbar spondylolisthesis.  At that time, she underwent an anterior exposure with anterior lumbar interbody fusion, L5-S1 on November 09, 2010.  The patient has been home and has had ongoing back pain and was recently treated with a prednisone taper.  The taper ended on Saturday, December 31, 2010, which is when she started having some abdominal pain.  She describes it being mid- epigastric and became progressively worse over the weekend and by Monday she reached at the point where she could not walk.  EMS was called and she was transported to [facility], where she was found to have an amylase greater than 3000.  She was admitted by the hospitalist and has had improvement of her amylase and her LFTs.  But she continues to have pain.  CT on admission January 02, 2011, shows peripancreatic inflammatory changes compatible with acute pancreatitis as a small amount of peripancreatic fluid associated with the body.  There is no drainable fluid collection or abscess.  The gallbladder was mildly distended, but otherwise remarkable.  She does have kidney stones bilaterally, the largest being 8 mm in the right upper pole.  No evidence of hydronephrosis or bowel obstruction.  The appendix was normal.  The colon was decompressed.  An MRCP was done, January 04, 2011.  This shows acute pancreatitis with a small  peripancreatic acute fluid collection but no overt pseudocyst.  There is borderline dilated common duct at 6 mm, but without discrete internal filling defects observed.  There was no dorsal pancreatic ductal dilatation or pancreas divisum.  The amount of upper abdominal ascites is slightly increased. The patient refused IV contrast.  There is a trace to bilateral effusions.  There was mild gallbladder wall thickening and a small amount of pericholecystic fluid.  This will be secondary to hypoalbuminemia, cholecystitis, or secondary involvement from the pancreatitis.  She had abdominal ultrasound on May 19, 2010, for abdominal pain.  At that time, the gallbladder showed no stones, no gallbladder wall thickening, and no pericholecystic fluid.  Murphy sign was negative.  Common bile duct was a maximum of 4 mm.  There was a small subcentimeter lesion identified in the lower pole of the spleen, but this is essentially a normal ultrasound.  PAST MEDICAL HISTORY: 1. Hospitalized November 09, 2010, to November 12, 2010, with lumbar     spondylolisthesis and surgery. 2. GERD. 3. Kidney stones. 4. Irritable bowel syndrome. 5. Depression and anxiety. 6. Hypothyroid. 7. Endometriosis. 8. Chronic back pain, on and off since her surgery.  PAST SURGICAL HISTORY: 1. Anterior lumbar fusion, L5-S1, December 10, 2010. 2. Robotically assisted right salpingo oophorectomy and lysis of     adhesions, October 2011. 3. Right cystoscopy, ureteroscopy, and ureteral stent, July 2011. 4. She has had surgery cervical surgery at [facility], April 2011.  FAMILY HISTORY:  Father died with cancer of the lung with mets.  Mother is living in good health.  Two sisters, both in good health.  SOCIAL HISTORY:  Tobacco:  None.  Alcohol:  None for the last 2-3 years, she was social user prior.  Drugs:  None.  She works at home.  She is married, they have one 7 year old son.  REVIEW OF SYSTEMS:  FEVER:  She has had some  fever on and off, low-grade here.  SKIN:  Some itching, otherwise negative.  CV:  No history of stroke or seizure.  WEIGHT:  She has been gaining some weight since her neck surgery.  CARDIAC:  No history of chest pain or palpitations. PULMONARY:  No orthopnea, PND, or dyspnea on exertion.  GI:  Positive for GERD.  No diarrhea or constipation.  There is some irritable bowel syndrome with some constipation chronically.  No blood in her stool. Her last bowel movement was Saturday.  GU:  She complains of having difficulty starting her stream sometimes, complaints of too much pressure in lower extremities.  She has had some edema in the past, currently none.  She has some pain with walking which she relates to her back.  PSYCHIATRIC:  She has had increased problems with depression since the death of her father and is going to an outpatient therapy for this.  HOME MEDICATIONS: 1. Neurontin 2 tablets b.i.d. 2. Potassium chloride 1 b.i.d. 3. Zantac b.i.d. 4. Claritin 1 daily. 5. Hydrochlorothiazide 25 mg b.i.d. 6. Xanax 1 tablet t.i.d. 7. Wellbutrin daily. 8. Methocarbamol 1 t.i.d. 9. Cymbalta 1 daily. 10.Dulcolax p.r.n.  HOSPITAL MEDICATIONS:  Include: 1. Lovenox. 2. Primaxin. 3. Sliding scale insulin. 4. Protonix. 5. Albuterol. 6. Dilaudid. 7. Lorazepam. 8. Zofran. 9. Phenergan.  ALLERGIES:  AMBIEN causes hallucinations.  PHYSICAL EXAM:  GENERAL:  This is a miserable-appearing white female, but in no acute distress. VITAL SIGNS:  Temperature is 98.3, heart rate is 69-98, last blood pressure is 116/74, respiratory rate is 16, and sats are 96% on room air.  She does have oxygen which she keeps taking on and off. EARS NOSE, THROAT, AND MOUTH:  All within normal limits. NECK:  Trachea is midline.  Thyroid is not palpable.  There is no JVD. There is no bruits. CHEST:  Clear to auscultation and percussion. CARDIAC:  Normal S1 and S2.  No murmurs or rubs.  Pulses are +2 and equal  both upper and lower extremities. ABDOMEN:  Bowel sounds are present.  She is not distended.  She is tender all over.  The most tender place appears to be midepigastric, which is also quite tender right upper and left upper quadrants.  She complains of pain when she is not palpated as being below the umbilicus both sides.  No masses.  No hernia.  She has a well-healed surgical scar on the abdomen. GU/RECTAL:  Deferred.  Lymphadenopathy none palpated. MUSCULOSKELETAL:  Within normal limits. SKIN:  No changes. NEUROLOGIC:  There is no focal deficits.  Cranial nerves are grossly intact.  PSYCHIATRIC: Very anxious and uncomfortable, but normal affect.  LABORATORY DATA:  White count on admission, January 02, 2011, was 16.1, January 03, 2011, 13.5, and today it is 14.4.  Hemoglobin and hematocrit are stable at 13.5 and 41  today.  Platelets are 379.  Lipase on admission, January 02, 2011, was greater, 3010; January 03, 2011, it is 1577; today it was 95.  Total bilirubin has ranged from 0.7 to 0.6 since admission.  Alkaline phosphatase has been normal at 110 on admission, 81 today.  SGOT on admission was 593, SGPT was 468; today, they were down to 42 and 130 respectively.  Sodium is 136, potassium is 3.6, chloride is 99, CO2 is 24, BUN is 7, creatinine 0.71, glucose is 91, and procalcitonin is 0.18.  Hemoglobin A1c is 5.4.  Triglycerides are 128.  DIAGNOSTICS:  As above.  IMPRESSION: 1. Pancreatitis. 2. Acute on chronic back pain, off steroids just prior to onset of     symptoms. 3. Back surgery November 09, 2010. 4. Nephrolithiasis. 5. Hypothyroid. 6. Gastroesophageal reflux disease. 7. Anxiety and depression. 8. Body mass index of 37.  PLAN:  The patient has been evaluated and treated and seen by Dr. Dwain Sarna.  He thinks we should treat her with medical management at this point.  We will follow with you. Will repeat her Abdominal Ultrasound in AM.     Eber Hong,  P.A.   ______________________________ Juanetta Gosling, MD    WDJ/MEDQ  D:  01/04/2011  T:  01/04/2011  Job:  [id number]  cc:   Cam Hai, C.N.M.  Hillery Aldo, M.D.  Electronically Signed by Sherrie George P.A. on 01/07/2011 03:44:17 PM Electronically Signed by Emelia Loron MD on 01/08/2011 08:11:30 PM

## 2011-01-09 ENCOUNTER — Other Ambulatory Visit (INDEPENDENT_AMBULATORY_CARE_PROVIDER_SITE_OTHER): Payer: Self-pay | Admitting: General Surgery

## 2011-01-09 ENCOUNTER — Inpatient Hospital Stay (HOSPITAL_COMMUNITY): Payer: Managed Care, Other (non HMO)

## 2011-01-09 DIAGNOSIS — K8010 Calculus of gallbladder with chronic cholecystitis without obstruction: Secondary | ICD-10-CM

## 2011-01-09 HISTORY — PX: LAPAROSCOPIC CHOLECYSTECTOMY: SUR755

## 2011-01-09 LAB — DIFFERENTIAL
Eosinophils Absolute: 0.2 10*3/uL (ref 0.0–0.7)
Eosinophils Relative: 3 % (ref 0–5)
Lymphs Abs: 2 10*3/uL (ref 0.7–4.0)
Monocytes Absolute: 0.5 10*3/uL (ref 0.1–1.0)
Neutrophils Relative %: 59 % (ref 43–77)

## 2011-01-09 LAB — CBC
HCT: 27 % — ABNORMAL LOW (ref 36.0–46.0)
MCHC: 31.1 g/dL (ref 30.0–36.0)
Platelets: 303 10*3/uL (ref 150–400)
RDW: 14.5 % (ref 11.5–15.5)
WBC: 6.8 10*3/uL (ref 4.0–10.5)

## 2011-01-09 LAB — GLUCOSE, CAPILLARY
Glucose-Capillary: 107 mg/dL — ABNORMAL HIGH (ref 70–99)
Glucose-Capillary: 97 mg/dL (ref 70–99)
Glucose-Capillary: 86 mg/dL (ref 70–99)
Glucose-Capillary: 103 mg/dL — ABNORMAL HIGH (ref 70–99)

## 2011-01-09 LAB — TYPE AND SCREEN: Antibody Screen: NEGATIVE

## 2011-01-09 LAB — COMPREHENSIVE METABOLIC PANEL
AST: 11 U/L (ref 0–37)
Albumin: 2.7 g/dL — ABNORMAL LOW (ref 3.5–5.2)
Alkaline Phosphatase: 103 U/L (ref 39–117)
BUN: <3 mg/dL — ABNORMAL LOW (ref 6–23)
Creatinine, Ser: 0.61 mg/dL (ref 0.50–1.10)
Potassium: 3.5 mEq/L (ref 3.5–5.1)
Total Protein: 5.8 g/dL — ABNORMAL LOW (ref 6.0–8.3)

## 2011-01-09 LAB — ABO/RH: ABO/RH(D): B POS

## 2011-01-09 LAB — LIPASE, BLOOD: Lipase: 19 U/L (ref 11–59)

## 2011-01-10 LAB — GLUCOSE, CAPILLARY: Glucose-Capillary: 122 mg/dL — ABNORMAL HIGH (ref 70–99)

## 2011-01-12 NOTE — Op Note (Signed)
NAMEGIUSEPPINA, [name] NO.:  [id number]  MEDICAL RECORD NO.:  [id number]  LOCATION:  1531                         FACILITY:  [facility]  PHYSICIAN:  Lodema Pilot, MD       DATE OF BIRTH:  15-Apr-1965  DATE OF PROCEDURE:  01/09/2011 DATE OF DISCHARGE:                              OPERATIVE REPORT   PROCEDURE:  Laparoscopic cholecystectomy with intraoperative cholangiogram.  PREOPERATIVE DIAGNOSIS:  Gallstone pancreatitis.  POSTOPERATIVE DIAGNOSIS:  Pancreatitis.  SURGEON:  Lodema Pilot, MD  ASSISTANT:  None.  ANESTHESIA:  General endotracheal anesthesia with 30 cc of 1% lidocaine with epinephrine, 0.25% of Marcaine in a 50:50 mixture.  FLUIDS:  1000 cc of crystalloid.  ESTIMATED BLOOD LOSS:  Minimal.  DRAINS:  None.  SPECIMENS:  Gallbladder and contents sent to pathology for permanent section.  COMPLICATIONS:  None apparent.  FINDINGS:  Normal intraoperative cholangiogram except for evidence of distal common bile duct stricturing, but no evidence of retained stones. Dilated cystic duct and a loop was placed on the cystic duct.  No other significant intra-abdominal pathology is identified other than some retroperitoneal edema consistent with her recent pancreatitis.  INDICATION:  [name] is a 45 year old female who is admitted for abdominal pain and gallstone pancreatitis.  Her pancreatitis had chemically resolved and her pain was much improved and it was decided to proceed with cholecystectomy.  I did discuss with the patient and her husband preoperatively the risks of the procedure, including infection, bleeding, pain, scarring, persistent pain and symptoms, injury to bowel or bile ducts, retained stones, and I did specifically discuss the fact that given her few contradictory imaging studies, which she had, some had demonstrated gallstones and some had not that there was potential for doing this without even signing the presence of gallstones.   She expressed understanding and desired to proceed with cholecystectomy.  OPERATIVE DETAILS:  [name] was seen and evaluated in the surgical ward and risks and benefits of the procedure were discussed in lay terms.  Informed consent was obtained.  Prophylactic antibiotics were given.  She was taken to the operating room, placed on the table in supine position, and the port was in place.  General endotracheal anesthesia was obtained, and her abdomen was prepped and draped in a standard surgical fashion.  Then, a supraumbilical midline incision was made in the skin and dissection carried down to the abdominal wall fascia using blunt dissection.  The fascia was sharply incised and the peritoneum was entered.  Under direct visualization, a balloon port was placed at the umbilicus and pneumoperitoneum was obtained.  Laparoscope was introduced and there was no evidence of bowel injury upon entry. Then, her 11 mm epigastric trocar and two 5 mm right upper quadrant trocars were placed under direct visualization and the gallbladder was retracted cephalad.  She was noted to have some retroperitoneal edema consistent with her pancreatitis, but the gallbladder appeared to be without any significant pathology.  The peritoneum was taken down from the gallbladder and the cystic duct and artery were easily visualized. The cystic duct was skeletonized and a clip was placed on the cystic artery.  The triangle of Calot was further skeletonized  and a critical view of safety was obtained visualizing a single cystic duct entering the gallbladder and the liver parenchyma through the triangle of Calot with a single cystic artery.  After the cystic duct was skeletonized and it was confident that this was indeed the cystic duct, a clip was placed on the gallbladder side and a small cystic ductotomy was made and a cholangiogram catheter was placed through the abdominal wall through a 14-gauge angiocatheter.   Cholangiogram was performed, which demonstrated no evidence of any common bile duct filling defects, although there did appear to be some narrowing of the distal common bile duct.  The right and left hepatic ducts were visualized.  There was free flow of bile into the peritoneal contrast into the duodenum.  The cholangiogram catheter was removed and few clips were placed on the cystic duct, but the cystic duct was fairly wide and although the clips did appear to completely occlude the duct, I placed a #0 PDS endo-loop on the cystic duct as well just under MAC clips for additional security.  The previously clipped cystic artery was divided between hemoclips and the gallbladder was removed from the gallbladder fossa using Bovie electrocautery.  The gallbladder was not entered during the dissection, although there was some spillage of bile from where the grasper had abraded the wall of the gallbladder and there was some leakage from this.  The gallbladder fossa appeared to be hemostatic after removal and the gallbladder was removed from the umbilicus in an EndoCatch bag.  I palpated the gallbladder and there did not appear to have any obvious stones.  The gallbladder was sent to pathology for permanent sectioning. The laparoscope was reintroduced and again the gallbladder fossa was noted be hemostatic.  The right upper quadrant was irrigated with sterile saline solution until the irrigation returned clear suctioning any bowel that had been spilled from the gallbladder.  The clips appeared to be in good position, and again, the gallbladder fossa appeared to be hemostatic.  The remainder of the abdomen was examined and there was no evidence of any other obvious pathology and the right upper quadrant trocar sites were removed under direct visualization. Then, the umbilical fascia was approximated with interrupted 0 Vicryl sutures in open fashion, and after these were secured, I reintroduced the  laparoscope through the epigastric trocar site, and then the abdominal wall closure was noted to be adequate.  There was no evidence of bowel injury.  The epigastric trocar was removed and the incisions were injected with a total of 30 cc of 1% lidocaine with epinephrine and 0.25% of Marcaine in a 50:50 mixture.  The skin edges were then approximated with a 4-0 Monocryl subcuticular suture.  The skin was washed and dried and Dermabond was applied.  All sponge, needle, instrument counts were correct at the end of the case.  The patient tolerated the procedure well without apparent complications.          ______________________________ Lodema Pilot, MD     BL/MEDQ  D:  01/09/2011  T:  01/10/2011  Job:  [id number]  Electronically Signed by Lodema Pilot DO on 01/12/2011 10:57:27 AM

## 2011-01-12 NOTE — Discharge Summary (Signed)
[name], [name] NO.:  [id number]  MEDICAL RECORD NO.:  [id number]  LOCATION:  1531                         FACILITY:  [facility]  PHYSICIAN:  Hillery Aldo, M.D.   DATE OF BIRTH:  26-Sep-1965  DATE OF ADMISSION:  01/02/2011 DATE OF DISCHARGE:  01/10/2011                              DISCHARGE SUMMARY   PRIMARY CARE PHYSICIAN:  Lupita Raider, M.D.  GENERAL SURGEON:  Lodema Pilot, MD.  ORTHOPEDIC SURGEON:  Alvy Beal, MD.  NEURO SURGEON: 1. Durene Cal IV, MD  DISCHARGE DIAGNOSES: 1. Gallstone pancreatitis, status post laparoscopic cholecystectomy on     January 09, 2011. 2. Hypothyroidism. 3. Normocytic anemia. 4. Altered mental status/delirium thought to be secondary to narcotic     pain medicines. 5. History of depression/anxiety. 6. Chronic back pain, status post anterior exposure of L5-S1 disks     space. 7. Hypertension. 8. Bilateral nonobstructing renal calculi. 9. Dyslipidemia.  DISCHARGE MEDICATIONS: 1. Bisacodyl 5 mg 2 tabs p.o. daily p.r.n. constipation. 2. Bupropion XL 300 mg p.o. daily. 3. Cymbalta 30 mg p.o. b.i.d. 4. Gabapentin 600 mg p.o. q.a.m., 300 mg p.o. q. afternoon and q.p.m. 5. Hydrochlorothiazide 25 mg p.o. daily. 6. Levothyroxine 100 mcg p.o. daily. 7. Loratadine OTC 1 tab p.o. daily p.r.n. allergies. 8. Milk of Magnesia 30 cc p.o. q.6 hours p.r.n. constipation. 9. Percocet 10/325 mg 1 tab p.o. q.4 hours p.r.n. (number 30 written     for). 10.Robaxin 500 mg p.o. t.i.d. p.r.n. muscle spasms. 11.Urocit-K ER 10 mEq p.o. b.i.d. 12.Xanax 1 mg p.o. q.i.d. p.r.n. 13.Zantac 150 mg p.o. t.i.d.  CONSULTATIONS:  Juanetta Gosling, MD and Lodema Pilot, MD of General Surgery.  BRIEF ADMISSION HPI:  The patient is a 45 year old female who presented to the hospital with a chief complaint of abdominal pain.  Upon initial evaluation in the emergency department, a lipase was checked and found to be markedly elevated  greater than 3000.  She was subsequently was referred to the hospitalist service for further evaluation and treatment of her acute pancreatitis.  For the full details, please see the dictated report done by Dr. Noel Gerold.  PROCEDURES AND DIAGNOSTIC STUDIES: 1. CT scan of the abdomen and pelvis on January 02, 2011, showed     findings compatible with acute pancreatitis.  No drainable fluid     collection or abscess.  Bilateral nonobstructing renal calculi,     measuring up to 8 mm in the right upper pole.  No hydronephrosis.     No ureteral or bladder calculi. 2. MRCP done on January 04, 2011, showed acute pancreatitis with small     peripancreatic acute fluid collections, but no overt pseudocyst.     Borderline dilated common bile duct at 6 mm, but without a discrete     internal filling defect observed.  No dorsal pancreatic duct     dilatation or pancreas divisum.  The amount of upper abdominal     ascites slightly increased compared to January 02, 2011.  The     patient refused IV contrast.  Trace bilateral pleural effusions     with associated passive atelectasis.  Mild gallbladder wall     thickening  with a small amount of pericholecystic fluid. 3. Abdominal ultrasound on January 05, 2011, showed no evidence of     ascites.  Tiny mobile gallstones and gallbladder sludge without     evidence of acute cholecystitis.  Mildly prominent pancreas which     likely reflects known pancreatitis. 4. Laparoscopic cholecystectomy and intraoperative cholangiogram done     on January 09, 2011, was negative for ductal stone or obstruction.  DISCHARGE LABORATORY VALUES:  White blood cell count was 6.8, hemoglobin 8.4, hematocrit 27, platelets 303.  Sodium was 142, potassium 3.5, chloride 108, bicarb 27, BUN less than 3, creatinine 0.61, glucose 97, total bilirubin 0.4, alkaline phosphatase 103, AST 11, ALT 26, total protein 5.8, albumin 2.7, calcium 8.8.  Lipase was 19.  Acute hepatitis serologies  were negative.  Hemoglobin A1c was 5.4.  TSH was 1.959. Lipids showed a cholesterol of 215, triglycerides 128, HDL 53, LDL 136.  HOSPITAL COURSE BY PROBLEM: 1. Acute gallstone pancreatitis:  The patient was admitted and the     workup for the underlying cause of her pancreatitis was undertaken.     It was ascertained that the patient's acute pancreatitis was most     likely due to gallstones/sludge.  Because of persistent symptoms,     despite improving LFTs and lower lipase levels, a surgical     consultation was requested and initially provided by Dr. Dwain Sarna.     The patient ultimately underwent a laparoscopic cholecystectomy on     January 09, 2011, with rapid resolution of her symptoms.  At this     point, she is stable for discharge home.  If she develops further     episodes of pancreatitis, would discontinue hydrochlorothiazide,     which is occasionally associated with pancreatitis. 2. Hypothyroidism:  The patient was maintained on IV Synthroid when     she was n.p.o.  TSH was checked and found to be within normal     limits. 3. Depression/anxiety: The patient can resume her Cymbalta and Xanax     at discharge.  These were held while she was in the hospital since     for the majority of her hospital stay, she was NPO. 4. Chronic back pain with recent back surgery:  The patient needed     high doses of narcotics to control her abdominal and back pain     while in the hospital.  These were gradually tapered over the     course of her hospital stay.  She will be discharged on Percocet,     which she was taking prior to admission. 5. Delirium:  The patient did develop some delirium that seemed to be     related to narcotic use.  As her opiates were weaned, the patient's     delirium cleared. 6. Normocytic anemia:  The patient's hemoglobin and hematocrit are     stable with a discharge hemoglobin of 9.5.  She can follow up with     her primary care physician if further diagnostic  evaluation is     deemed necessary. 7. Nonobstructing nephrolithiasis:  No active issues during this     hospital stay. 8. Dyslipidemia:  The patient does have elevated lipids and we will     defer to the patient's primary care physician regarding whether or     not to initiate treatment for this.  PHYSICAL EXAMINATION:  At discharge, VITAL SIGNS:  Temperature 98.1, blood pressure 130/84, pulse 66, respirations 18, O2  saturation 98% on room air. GENERAL:  No acute distress.  CARDIOVASCULAR:  Regular rate, rhythm.  No murmurs, rubs, or gallops. ABDOMEN:  Soft.  Mildly tender around the incision site.  Nondistended. EXTREMITIES:  No clubbing, edema, or cyanosis.  DISCHARGE INSTRUCTIONS: 1. Activity:  No restrictions. 2. Diet:  Low-fat/cholesterol. 3. Wound care:  May shower in 24 hours.  FOLLOWUP:  Followup with Dr. Biagio Quint of [facility] in 3 weeks.  Call for an appointment.  Follow up with Dr. Lupita Raider as needed.  Recommendations for followup:  Consideration of an anemia workup and initiation of treatment for dyslipidemia.  Time spent coordinating care for discharge, discharge instructions including face-to-face time was approximately 35 minutes.     Hillery Aldo, M.D.     CR/MEDQ  D:  01/10/2011  T:  01/10/2011  Job:  [id number]  cc:   Lupita Raider, M.D. Fax: [phone]  Lodema Pilot, MD [address], [address] [place], [place] [address]  Alvy Beal, MD Fax: [phone]  V. Charlena Cross, MD [address] [place] [address]  Electronically Signed by Hillery Aldo M.D. on 01/12/2011 57:84:69 PM

## 2011-01-18 NOTE — H&P (Unsigned)
NAMETORIANNE, LAFLAM NO.:  [id number]  MEDICAL RECORD NO.:  [id number]  LOCATION:  1531                         FACILITY:  [facility]  PHYSICIAN:  Valetta Close, M.D.   DATE OF BIRTH:  05-22-1965  DATE OF ADMISSION:  01/02/2011 DATE OF DISCHARGE:  01/10/2011                             HISTORY & PHYSICAL   CHIEF COMPLAINT:  Abdominal pain.  HISTORY OF PRESENT ILLNESS:  This is a 45 year old female with a past medical history of multiple back and neck surgeries and nephrolithiasis who over the last 3 days has been somewhat sleepiness, had a decreased appetite.  Of note, she recently had a back surgery where she just completed an oral prednisone taper on Saturday.  She says she has been feeling kind of unwell since may be in the last 3 days which would be again Saturday.  She thought that her sleepiness was secondary to her increased exercise after the back surgery.  She also had a decreased appetite, but this morning at around 4:00 in the morning, she developed watery diarrhea.  This persisted and was associated with abdominal pain. The pain was worse in the middle to right upper quadrant, but it did radiate around her entire abdomen.  She thought it would get better with the diarrhea, but it did not and it started becoming unbearable at around 11 o'clock when she was on her way for a doctor's appointment. When she got out of her car, the pain in her abdomen was simply too excruciating and she felt very hot and had chills with this pain, and because of this, she presents to the ER were she was found to have a pancreatitis on CT scan with a lipase of greater than 3000.  PAST MEDICAL HISTORY: 1. IBS with depression and anxiety. 2. Back pain with neck surgeries and recent back surgeries.  She has     had multiple surgeries. 3. Hypothyroidism. 4. Nephrolithiasis. 5. Endometriosis for which she has had a hysterectomy and an     oophorectomy.  ALLERGIES:  She has  an allergy to [facility].  She is married.  No alcohol.  No tobacco.  No drugs.  She has a family history of lung cancer, coronary artery disease, anxiety and depression.  She recently had an increase in her Neurontin and was taken off oxycodone and OxyContin and switched to Percocet.  She is a full code.  PHYSICAL EXAMINATION:  VITAL SIGNS:  Temperature 97.3, heart rate 83, blood pressure 132/108, respiratory rate 22, O2 sat 100% on room air. GENERAL:  She is very uncomfortable appearing. HEENT:  Her eyes are anicteric.  She has moist oral mucosa. LUNGS:  Clear to auscultation bilaterally. CARDIAC:  Regular rate and rhythm.  No murmur. ABDOMEN:  Bowel sounds are positive.  She has marked right upper quadrant pain and what I would call a Murphy sign.  She also has moderate pain throughout her abdomen, worse in the middle than on the left. EXTREMITIES:  No edema. NEUROLOGIC:  Alert and oriented x3.  Cranial nerves II through XII intact.  Affect is appropriate.  LABORATORY DATA:  Lipase is 3000.  White count 16, hemoglobin 14, hematocrit 41, platelets  349, MCV 79, ANC 15.  Sodium 137, potassium 3.3, chloride 102, bicarb 20, BUN 12, creatinine 0.7, glucose 132, calcium 9.4, total protein 7.5 and 4.1, AST is 593, ALT is 468, alk phos is 110, bilirubin 0.7.  A CT scan shows acute pancreatitis with a mild gallbladder distention, but no obvious signs of cholecystitis.  There are also no signs of any fluid to aspirate.  ASSESSMENT AND PLAN: 1. Severe pancreatitis with systemic inflammatory response syndrome.     She meets criteria with a respiratory rate greater than 20 and a     white count greater than 12.  I am going to hold off on antibiotics     and I see no signs of pancreatic necrosis, but I will hydrate     aggressively.  I will rule out hypertriglyceridemia as the cause.     I also think gallstones might be possible given that she has a     positive Murphy sign, so we will get  an MRCP tomorrow.  I do not     think she would tolerate a right upper quadrant ultrasound or a     HIDA scan because of the pain in her right upper quadrant.  I am     going to give her aggressive pain control, nausea control as well.     We will check an LDH and a lactic acid and procalcitonin tomorrow     in addition to aggressive hydration and supportive care.  It is     possible that this could be related to her steroids, though I have     never personally seen steroids associated with the pancreatitis, it     is in the literature.  She is also in the time frame for adrenal     crisis given that she has recently been on steroids, but these     symptoms simply do not correlate with that, and I am not sure that     adrenal insufficiency will cause the pancreatitis, it will cause     the lethargy and sleepiness and decreased appetite, but not the     pancreatitis.  I am going to hold off giving steroids for now     because if I give her IV steroids and steroids were, in fact, the     culprit for the pancreatitis, that would make it worse, so I will     hold off on steroids for now and give her aggressive hydration. 2. Hepatitis.  Two questions, whether this is reactive to the     pancreatitis or if the pancreatitis is the cause of     choledocholithiasis with associated hepatitis.  I will recheck     tomorrow CMP and get the MRCP and follow.  I will treat with     supportive care. 3. Abdominal pain.  This is reactive. 4. Leukocytosis.  I suspect this is reactive as well. 5. Anxiety.  I will put her on scheduled and as needed Ativan.  Medications obtained from the ER list include: 1. Neurontin 2 tablets three times a day. 2. Potassium citrate 1 tablet twice a day. 3. Zantac by mouth twice a day. 4. Claritin once a day. 5. Hydrochlorothiazide 25 mg by mouth twice a day. 6. Xanax by mouth three times a day. 7. Wellbutrin by mouth once a day. 8. Methocarbamol by mouth three times a  day. 9. Cymbalta by mouth once a day. 10.Dulcolax as needed for constipation.  Approximate  length of time spent on this admission was approximately 45 minutes.     Valetta Close, M.D.     JC/MEDQ  D:  01/02/2011  T:  01/18/2011  Job:  [id number]

## 2011-01-26 ENCOUNTER — Encounter (INDEPENDENT_AMBULATORY_CARE_PROVIDER_SITE_OTHER): Payer: Self-pay | Admitting: General Surgery

## 2011-01-26 ENCOUNTER — Ambulatory Visit (INDEPENDENT_AMBULATORY_CARE_PROVIDER_SITE_OTHER): Payer: Managed Care, Other (non HMO) | Admitting: General Surgery

## 2011-01-26 VITALS — BP 128/86 | HR 92 | Temp 97.6°F | Resp 16 | Ht 64.5 in | Wt 214.2 lb

## 2011-01-26 DIAGNOSIS — Z4889 Encounter for other specified surgical aftercare: Secondary | ICD-10-CM

## 2011-01-26 DIAGNOSIS — Z5189 Encounter for other specified aftercare: Secondary | ICD-10-CM

## 2011-01-26 NOTE — Progress Notes (Signed)
Subjective:     Patient ID: [name], female   DOB: August 28, 1965, 45 y.o.   MRN: [id number]  HPI This patient follows up 2 weeks status post laparoscopic cholecystectomy for gallstone pancreatitis. Her incisional pain has improved although she still has some upper abdominal bloating and discomfort. She states that she had some bloating and reflux after eating and symptoms similar to before surgery. She is moving her bowels and her bowels infection improved since her surgery. She has had multiple back and neck surgeries in history of kidney stones as well and has been on pain medication since even before surgery.She has not been throwing up but has not been wanting to eat due to bloating and discomfort.  Review of Systems     Objective:   Physical Exam No acute distress and nontoxic-appearing  Her abdomen is soft and she has some mild right lower quadrant tenderness and mild epigastric tenderness on exam her incisions are well-healed without sign of infection and no evidence of hernia. No mass appreciated    Assessment:     Status post cholecystectomy for treatment of gallstone pancreatitis  She has persistent symptoms similar to before surgery. She also has bloating and what sounds like could be peptic ulcer disease or gastritis or gastroparesis. She also has a history of irritable bowel syndrome and kidney stones in her symptoms could also be related to this as well. I have a low suspicion for postop complication. This could also be a persistent pancreatitis or pseudocyst.      Plan:     I requested some laboratory studies today and recommended that she followup with Dr. Adriana Mccallum her gastroenterologist for further evaluation as well. She's going to call me back in 2 weeks if she is still having persistent symptoms. If at that time she still having symptoms I would consider CT scan of the abdomen to further evaluate for possible continued pancreatitis or pseudocyst. Meanwhile, I have  recommended gastroparesis diet and continue her Zantac that she has been taking over-the-counter.

## 2011-01-27 ENCOUNTER — Other Ambulatory Visit: Payer: Self-pay | Admitting: Gastroenterology

## 2011-01-27 DIAGNOSIS — R109 Unspecified abdominal pain: Secondary | ICD-10-CM

## 2011-02-01 ENCOUNTER — Ambulatory Visit
Admission: RE | Admit: 2011-02-01 | Discharge: 2011-02-01 | Disposition: A | Discharge status: Home / Self Care | Payer: Managed Care, Other (non HMO) | Source: Ambulatory Visit | Attending: Gastroenterology | Admitting: Gastroenterology

## 2011-02-01 DIAGNOSIS — R109 Unspecified abdominal pain: Secondary | ICD-10-CM

## 2011-02-01 MED ORDER — IOHEXOL 300 MG/ML  SOLN
125.0000 mL | Freq: Once | INTRAMUSCULAR | Status: AC | PRN
  Administered 2011-02-01: 125 mL via INTRAVENOUS

## 2011-02-10 ENCOUNTER — Encounter (INDEPENDENT_AMBULATORY_CARE_PROVIDER_SITE_OTHER): Payer: Managed Care, Other (non HMO) | Admitting: General Surgery

## 2011-02-22 ENCOUNTER — Other Ambulatory Visit: Payer: Self-pay | Admitting: Gastroenterology

## 2011-02-22 ENCOUNTER — Ambulatory Visit
Admission: RE | Admit: 2011-02-22 | Discharge: 2011-02-22 | Disposition: A | Discharge status: Home / Self Care | Payer: Managed Care, Other (non HMO) | Source: Ambulatory Visit | Attending: Gastroenterology | Admitting: Gastroenterology

## 2011-02-22 MED ORDER — IOHEXOL 300 MG/ML  SOLN
40.0000 mL | Freq: Once | INTRAMUSCULAR | Status: AC | PRN
  Administered 2011-02-22: 40 mL via ORAL

## 2011-02-22 MED ORDER — IOHEXOL 300 MG/ML  SOLN
125.0000 mL | Freq: Once | INTRAMUSCULAR | Status: AC | PRN
  Administered 2011-02-22: 125 mL via INTRAVENOUS

## 2011-02-27 ENCOUNTER — Encounter (HOSPITAL_COMMUNITY): Payer: Self-pay | Admitting: *Deleted

## 2011-03-01 ENCOUNTER — Encounter (HOSPITAL_COMMUNITY): Payer: Self-pay | Admitting: Anesthesiology

## 2011-03-01 ENCOUNTER — Ambulatory Visit (HOSPITAL_COMMUNITY): Payer: Managed Care, Other (non HMO) | Admitting: Anesthesiology

## 2011-03-01 ENCOUNTER — Encounter (HOSPITAL_COMMUNITY)
Admission: RE | Disposition: A | Discharge status: Home / Self Care | Payer: Self-pay | Source: Ambulatory Visit | Attending: Gastroenterology

## 2011-03-01 ENCOUNTER — Encounter (HOSPITAL_COMMUNITY): Payer: Self-pay | Admitting: *Deleted

## 2011-03-01 ENCOUNTER — Ambulatory Visit (HOSPITAL_COMMUNITY)
Admission: RE | Admit: 2011-03-01 | Discharge: 2011-03-01 | Disposition: A | Discharge status: Home / Self Care | Payer: Managed Care, Other (non HMO) | Source: Ambulatory Visit | Attending: Gastroenterology | Admitting: Gastroenterology

## 2011-03-01 DIAGNOSIS — K859 Acute pancreatitis without necrosis or infection, unspecified: Secondary | ICD-10-CM | POA: Insufficient documentation

## 2011-03-01 DIAGNOSIS — R1909 Other intra-abdominal and pelvic swelling, mass and lump: Secondary | ICD-10-CM | POA: Insufficient documentation

## 2011-03-01 DIAGNOSIS — K219 Gastro-esophageal reflux disease without esophagitis: Secondary | ICD-10-CM | POA: Insufficient documentation

## 2011-03-01 DIAGNOSIS — E039 Hypothyroidism, unspecified: Secondary | ICD-10-CM | POA: Insufficient documentation

## 2011-03-01 DIAGNOSIS — R1013 Epigastric pain: Secondary | ICD-10-CM | POA: Insufficient documentation

## 2011-03-01 DIAGNOSIS — Z9089 Acquired absence of other organs: Secondary | ICD-10-CM | POA: Insufficient documentation

## 2011-03-01 DIAGNOSIS — R109 Unspecified abdominal pain: Secondary | ICD-10-CM | POA: Insufficient documentation

## 2011-03-01 DIAGNOSIS — Z79899 Other long term (current) drug therapy: Secondary | ICD-10-CM | POA: Insufficient documentation

## 2011-03-01 HISTORY — PX: EUS: SHX5427

## 2011-03-01 HISTORY — DX: Anemia, unspecified: D64.9

## 2011-03-01 HISTORY — PX: FINE NEEDLE ASPIRATION: SHX5430

## 2011-03-01 HISTORY — DX: Hypothyroidism, unspecified: E03.9

## 2011-03-01 SURGERY — UPPER ENDOSCOPIC ULTRASOUND (EUS) LINEAR
Anesthesia: Monitor Anesthesia Care

## 2011-03-01 MED ORDER — KETAMINE HCL 10 MG/ML IJ SOLN
INTRAMUSCULAR | Status: DC | PRN
  Administered 2011-03-01 (×2): 10 mg via INTRAVENOUS

## 2011-03-01 MED ORDER — LIDOCAINE HCL (CARDIAC) 20 MG/ML IV SOLN
INTRAVENOUS | Status: DC | PRN
  Administered 2011-03-01: 80 mg via INTRAVENOUS

## 2011-03-01 MED ORDER — PROPOFOL 10 MG/ML IV EMUL
INTRAVENOUS | Status: DC | PRN
  Administered 2011-03-01: 120 ug/kg/min via INTRAVENOUS

## 2011-03-01 MED ORDER — PROPOFOL 10 MG/ML IV EMUL: INTRAVENOUS | Status: DC | PRN

## 2011-03-01 MED ORDER — CIPROFLOXACIN IN D5W 400 MG/200ML IV SOLN
INTRAVENOUS | Status: AC
  Filled 2011-03-01: qty 200

## 2011-03-01 MED ORDER — FENTANYL CITRATE 0.05 MG/ML IJ SOLN
INTRAMUSCULAR | Status: DC | PRN
  Administered 2011-03-01: 50 ug via INTRAVENOUS

## 2011-03-01 MED ORDER — LACTATED RINGERS IV SOLN
INTRAVENOUS | Status: DC
  Administered 2011-03-01: 1000 mL via INTRAVENOUS

## 2011-03-01 MED ORDER — CIPROFLOXACIN IN D5W 400 MG/200ML IV SOLN
INTRAVENOUS | Status: DC | PRN
  Administered 2011-03-01: 400 mg via INTRAVENOUS

## 2011-03-01 MED ORDER — MIDAZOLAM HCL 5 MG/5ML IJ SOLN
INTRAMUSCULAR | Status: DC | PRN
  Administered 2011-03-01: 2 mg via INTRAVENOUS

## 2011-03-01 NOTE — Op Note (Signed)
[facility] [address] [place], [place]  [address]  ENDOSCOPIC ULTRASOUND PROCEDURE REPORT  PATIENT:  [name], [name]  MR#:  [id number] BIRTHDATE:  May 12, 1965  GENDER:  female  ENDOSCOPIST:  Willis Modena, MD REFERRED BY:  Lupita Raider, M.D. PROCEDURE DATE:  03/01/2011 PROCEDURE:  Upper EUS ASA CLASS:  Class II INDICATIONS:  pancreatitis, pancreatic cyst, epigastric abdominal pain  MEDICATIONS:   MAC sedation, administered by CRNA, Cetacaine spray x 2, cipro 400 mcg IV DESCRIPTION OF PROCEDURE:   After the risks benefits and alternatives of the procedure were  explained, informed consent was obtained. The patient was then placed in the left, lateral, decubitus postion and IV sedation was administered. Throughout the procedure, the patient's blood pressure, pulse and oxygen saturations were monitored continuously.  Under direct visualization, the Pentax EUS Linear [id number] endoscope was introduced through the mouth and advanced to the second portion of the duodenum.  Water was used as necessary to provide an acoustic interface.  Upon completion of the imaging, water was removed and the patient was sent to the recovery room in satisfactory condition.  <<PROCEDUREIMAGES>>  FINDINGS:  Ill-defined fluid collection along anterior portion of pancreatic body and tail.  This collection had abundant indwelling debris and did not have a well-defined border.  Size of this area was maximally 5 x 2 cm, with interval decrease in size compared to CT done one week ago.  Given the ill-defined nature of the fluid collection with the internal debris, as well as interval decrease in size, cyst aspiration was not performed.  I did not think it would be helpful, it would be highly unlikely to improve her symptoms, and could cause pancreatic fluid/ductal leak. Pancreatic parenchyma of the body and tail, due to her recent pancreatitis and fluid collection, was not well visualized,  and what was visualized had abundant inflammatory changes.  Pancreatic parenchyma of the head and uncinate pancreas was normal.  Few benign-appearing peripancreatic lymph nodes.  CBD  4 mm without choledocholithiasis.  Ampulla normal via EUS. Post-cholecystectomy.  ENDOSCOPIC IMPRESSION:      1.  Residual simmering changes of acute pancreatitis. 2.  Ill defined peripancreatic fluid collection, does not appear truly cystic, continues to               decrease in size, not aspirated for reasons as described above. 3.  Post-cholecystecomy.  Non dilated CBD without choledocholithiasis. 4.  I suspect patient's symptoms are due to pancreatitis (and not her peripancreatic fluid collection).  RECOMMENDATIONS:        1.  Watch for potential complications of procedure. 2.  Parke Simmers, low fat diet. 3.  Continue pancreatic enzymes. 4.  Analgesics as needed. 5.  Follow-up with [place] in a couple weeks.  ______________________________ Willis Modena  CC:  n. eSIGNEDWillis Modena at 03/01/2011 11:02 AM  Jacki Cones, [phone]

## 2011-03-01 NOTE — Anesthesia Postprocedure Evaluation (Signed)
  Anesthesia Post-op Note  Patient: [name] [facility]  Procedure(s) Performed:  UPPER ENDOSCOPIC ULTRASOUND (EUS) LINEAR; FINE NEEDLE ASPIRATION (FNA) LINEAR  Patient Location: PACU  Anesthesia Type: MAC  Level of Consciousness: awake and alert   Airway and Oxygen Therapy: Patient Spontanous Breathing  Post-op Pain: mild  Post-op Assessment: Post-op Vital signs reviewed, Patient's Cardiovascular Status Stable, Respiratory Function Stable, Patent Airway and No signs of Nausea or vomiting  Post-op Vital Signs: stable  Complications: No apparent anesthesia complications

## 2011-03-01 NOTE — Anesthesia Preprocedure Evaluation (Addendum)
Anesthesia Evaluation  Patient identified by MRN, date of birth, ID band Patient awake    Reviewed: Allergy & Precautions, H&P , NPO status , Patient's Chart, lab work & pertinent test results  History of Anesthesia Complications (+) AWARENESS UNDER ANESTHESIA  Airway Mallampati: II TM Distance: >3 FB Neck ROM: Full    Dental No notable dental hx.    Pulmonary neg pulmonary ROS,  clear to auscultation  Pulmonary exam normal       Cardiovascular neg cardio ROS Regular Normal    Neuro/Psych PSYCHIATRIC DISORDERS Depression Negative Neurological ROS  Negative Psych ROS   GI/Hepatic negative GI ROS, Neg liver ROS, GERD- (occ)  ,  Endo/Other  Negative Endocrine ROSHypothyroidism   Renal/GU negative Renal ROS  Genitourinary negative   Musculoskeletal negative musculoskeletal ROS (+)   Abdominal   Peds negative pediatric ROS (+)  Hematology negative hematology ROS (+)   Anesthesia Other Findings   Reproductive/Obstetrics negative OB ROS                          Anesthesia Physical Anesthesia Plan  ASA: II  Anesthesia Plan: MAC   Post-op Pain Management:    Induction: Intravenous  Airway Management Planned:   Additional Equipment:   Intra-op Plan:   Post-operative Plan: Extubation in OR  Informed Consent: I have reviewed the patients History and Physical, chart, labs and discussed the procedure including the risks, benefits and alternatives for the proposed anesthesia with the patient or authorized representative who has indicated his/her understanding and acceptance.   Dental advisory given  Plan Discussed with: CRNA  Anesthesia Plan Comments:         Anesthesia Quick Evaluation

## 2011-03-01 NOTE — H&P (Signed)
Patient interval history reviewed.  Patient examined again.  There has been no change from documented H/P dated 02/08/2011 (scanned into chart from our office) except as documented above.

## 2011-03-01 NOTE — Brief Op Note (Signed)
Please see EndoPro note dated 03/01/2011.

## 2011-03-01 NOTE — Preoperative (Signed)
Beta Blockers   Reason not to administer Beta Blockers:Not Applicable 

## 2011-03-01 NOTE — Transfer of Care (Signed)
Immediate Anesthesia Transfer of Care Note  Patient: [name] [facility]  Procedure(s) Performed:  UPPER ENDOSCOPIC ULTRASOUND (EUS) LINEAR; FINE NEEDLE ASPIRATION (FNA) LINEAR  Patient Location: PACU  Anesthesia Type: MAC  Level of Consciousness: awake, alert  and oriented  Airway & Oxygen Therapy: Patient Spontanous Breathing and Patient connected to nasal cannula oxygen  Post-op Assessment: Report given to PACU RN and Post -op Vital signs reviewed and stable  Post vital signs: Reviewed and stable  Complications: No apparent anesthesia complications

## 2011-03-08 ENCOUNTER — Encounter (HOSPITAL_COMMUNITY): Payer: Self-pay | Admitting: Gastroenterology

## 2011-03-17 ENCOUNTER — Encounter (HOSPITAL_COMMUNITY): Payer: Self-pay | Admitting: *Deleted

## 2011-03-17 ENCOUNTER — Emergency Department (HOSPITAL_COMMUNITY)
Admission: EM | Admit: 2011-03-17 | Discharge: 2011-03-17 | Disposition: A | Discharge status: Home / Self Care | Payer: Managed Care, Other (non HMO) | Attending: Emergency Medicine | Admitting: Emergency Medicine

## 2011-03-17 DIAGNOSIS — F329 Major depressive disorder, single episode, unspecified: Secondary | ICD-10-CM | POA: Insufficient documentation

## 2011-03-17 DIAGNOSIS — R109 Unspecified abdominal pain: Secondary | ICD-10-CM | POA: Insufficient documentation

## 2011-03-17 DIAGNOSIS — E039 Hypothyroidism, unspecified: Secondary | ICD-10-CM | POA: Insufficient documentation

## 2011-03-17 DIAGNOSIS — R110 Nausea: Secondary | ICD-10-CM | POA: Insufficient documentation

## 2011-03-17 DIAGNOSIS — Z79899 Other long term (current) drug therapy: Secondary | ICD-10-CM | POA: Insufficient documentation

## 2011-03-17 DIAGNOSIS — K219 Gastro-esophageal reflux disease without esophagitis: Secondary | ICD-10-CM | POA: Insufficient documentation

## 2011-03-17 DIAGNOSIS — F3289 Other specified depressive episodes: Secondary | ICD-10-CM | POA: Insufficient documentation

## 2011-03-17 DIAGNOSIS — Z9889 Other specified postprocedural states: Secondary | ICD-10-CM | POA: Insufficient documentation

## 2011-03-17 LAB — DIFFERENTIAL
Eosinophils Absolute: 0.6 10*3/uL (ref 0.0–0.7)
Eosinophils Relative: 9 % — ABNORMAL HIGH (ref 0–5)
Lymphs Abs: 2.8 10*3/uL (ref 0.7–4.0)
Monocytes Absolute: 0.6 10*3/uL (ref 0.1–1.0)
Monocytes Relative: 8 % (ref 3–12)

## 2011-03-17 LAB — URINALYSIS, ROUTINE W REFLEX MICROSCOPIC
Bilirubin Urine: NEGATIVE
Nitrite: NEGATIVE
Specific Gravity, Urine: 1.01 (ref 1.005–1.030)
Urobilinogen, UA: 0.2 mg/dL (ref 0.0–1.0)

## 2011-03-17 LAB — CBC
HCT: 38.3 % (ref 36.0–46.0)
Hemoglobin: 12.2 g/dL (ref 12.0–15.0)
MCH: 26.2 pg (ref 26.0–34.0)
MCV: 82.2 fL (ref 78.0–100.0)
RBC: 4.66 MIL/uL (ref 3.87–5.11)

## 2011-03-17 LAB — COMPREHENSIVE METABOLIC PANEL
Alkaline Phosphatase: 82 U/L (ref 39–117)
BUN: 7 mg/dL (ref 6–23)
Calcium: 9.5 mg/dL (ref 8.4–10.5)
Creatinine, Ser: 0.83 mg/dL (ref 0.50–1.10)
GFR calc Af Amer: >90 mL/min (ref >90)
Glucose, Bld: 82 mg/dL (ref 70–99)
Potassium: 3.6 mEq/L (ref 3.5–5.1)
Total Protein: 6.8 g/dL (ref 6.0–8.3)

## 2011-03-17 LAB — LIPASE, BLOOD: Lipase: 16 U/L (ref 11–59)

## 2011-03-17 MED ORDER — MORPHINE SULFATE 4 MG/ML IJ SOLN
4.0000 mg | Freq: Once | INTRAMUSCULAR | Status: AC
  Administered 2011-03-17: 4 mg via INTRAVENOUS
  Filled 2011-03-17: qty 1

## 2011-03-17 MED ORDER — SODIUM CHLORIDE 0.9 % IV SOLN
INTRAVENOUS | Status: DC
  Administered 2011-03-17: 13:00:00 via INTRAVENOUS

## 2011-03-17 MED ORDER — ONDANSETRON HCL 4 MG/2ML IJ SOLN
4.0000 mg | Freq: Once | INTRAMUSCULAR | Status: AC
  Administered 2011-03-17: 4 mg via INTRAVENOUS
  Filled 2011-03-17: qty 2

## 2011-03-17 MED ORDER — GLYCOPYRROLATE 2 MG PO TABS: 2.0000 mg | ORAL_TABLET | Freq: Three times a day (TID) | ORAL | Status: DC

## 2011-03-17 MED ORDER — OMEPRAZOLE 40 MG PO CPDR: DELAYED_RELEASE_CAPSULE | ORAL | Status: DC

## 2011-03-17 NOTE — ED Notes (Signed)
Pt. reports having surgery for acute pancreatitis.  Pt. Developed a cyst after the surgery and had to have surgery again.  Pt. reports not getting "quite well" after surgery.  Pt. Started with n/v last night.  Pt. Has c/o severe abdominal pain and cramping.

## 2011-03-17 NOTE — ED Provider Notes (Addendum)
History     CSN: [id number]  Arrival date & time 03/17/11  1014   First MD Initiated Contact with Patient 03/17/11 1203      Chief Complaint  Patient presents with  . Abdominal Pain  . Emesis    (Consider location/radiation/quality/duration/timing/severity/associated sxs/prior treatment) The history is provided by the patient.  she c/o abd pain with n/v since yesterday.  Past Medical History  Diagnosis Date  . Pancreatitis   . Depression   . GERD (gastroesophageal reflux disease)   . Thyroid disease   . Allergy   . Iron deficiency   . Hypothyroidism   . Anemia     Past Surgical History  Procedure Date  . Cholecystectomy 10/12  . Neck surgery 07/2009  . Oophorectomy 11/2009  . Back surgery 10/2010  . Abdominal hysterectomy 10/1999  . Eus 03/01/2011    Procedure: UPPER ENDOSCOPIC ULTRASOUND (EUS) LINEAR;  Surgeon: Freddy Jaksch, MD;  Location: [facility] ENDOSCOPY;  Service: Endoscopy;  Laterality: N/A;  . Fine needle aspiration 03/01/2011    Procedure: FINE NEEDLE ASPIRATION (FNA) LINEAR;  Surgeon: Freddy Jaksch, MD;  Location: [facility] ENDOSCOPY;  Service: Endoscopy;  Laterality: N/A;    Family History  Problem Relation Age of Onset  . Cancer Father     History  Substance Use Topics  . Smoking status: Never Smoker   . Smokeless tobacco: Not on file  . Alcohol Use: No    OB History    Grav Para Term Preterm Abortions TAB SAB Ect Mult Living                  Review of Systems  Constitutional: Negative for fever.  Gastrointestinal: Positive for nausea, vomiting and abdominal pain.    Allergies  Ambien  Home Medications   Current Outpatient Rx  Name Route Sig Dispense Refill  . ALPRAZOLAM 1 MG PO TABS Oral Take 1 mg by mouth 4 (four) times daily as needed.     . BUPROPION HCL ER (XL) 300 MG PO TB24 Oral Take 300 mg by mouth daily.     . DULOXETINE HCL 30 MG PO CPEP Oral Take 60 mg by mouth daily.      Marland Kitchen GABAPENTIN 300 MG PO CAPS Oral Take 300 mg by mouth 2  (two) times daily.     Marland Kitchen HYDROCHLOROTHIAZIDE 25 MG PO TABS Oral Take 25 mg by mouth daily.     . IRON PO Oral Take 1 tablet by mouth 2 (two) times daily.      Marland Kitchen LORATADINE 10 MG PO TABS Oral Take 10 mg by mouth daily.      Marland Kitchen MAGNESIUM HYDROXIDE 400 MG/5ML PO SUSP Oral Take 30 mLs by mouth daily as needed. For constipation     . METHOCARBAMOL 500 MG PO TABS Oral Take 500 mg by mouth 3 (three) times daily.     . OXYCODONE-ACETAMINOPHEN 10-325 MG PO TABS Oral Take 1 tablet by mouth every 6 (six) hours as needed. For pain    . POLYETHYLENE GLYCOL 3350 PO PACK Oral Take 17 g by mouth.     Marland Kitchen PROCTOFOAM HC 1-1 % RE FOAM Rectal Place 1 applicator rectally 2 (two) times daily as needed.     Marland Kitchen PROMETHAZINE HCL 25 MG PO TABS Oral Take 25 mg by mouth every 6 (six) hours as needed. For nausea    . SYNTHROID 100 MCG PO TABS Oral Take 100 mcg by mouth daily.       BP  129/78  Pulse 94  Temp(Src) 97.8 F (36.6 C) (Oral)  Resp 18  SpO2 92%  Physical Exam  Constitutional: She is oriented to person, place, and time. No distress.  HENT:  Head: Normocephalic and atraumatic.  Neck: Normal range of motion. Neck supple.  Pulmonary/Chest: Effort normal.  Abdominal: There is tenderness.  Neurological: She is alert and oriented to person, place, and time.  Skin: Skin is warm and dry.  Psychiatric: She has a normal mood and affect.    ED Course  Procedures (including critical care time)   Labs Reviewed  CBC  DIFFERENTIAL  COMPREHENSIVE METABOLIC PANEL  LIPASE, BLOOD  URINALYSIS, ROUTINE W REFLEX MICROSCOPIC   No results found.   No diagnosis found.  I spoke with Dr. Dulce Sellar.  He said the to perform a CAT scan only if her lipase level was elevated, or she had a significant leukocytosis.  Pain.  Controlled in the emergency department  MDM  Abdominal pain        Nicholes Stairs, MD 03/17/11 1537  Nicholes Stairs, MD 04/20/11 [phone]

## 2011-03-17 NOTE — ED Notes (Signed)
Reports onset of abd pain and n/v yesterday, hx of pancreatitis.

## 2011-03-23 ENCOUNTER — Other Ambulatory Visit: Payer: Self-pay | Admitting: Gastroenterology

## 2011-03-24 ENCOUNTER — Ambulatory Visit
Admission: RE | Admit: 2011-03-24 | Discharge: 2011-03-24 | Disposition: A | Discharge status: Home / Self Care | Payer: Managed Care, Other (non HMO) | Source: Ambulatory Visit | Attending: Gastroenterology | Admitting: Gastroenterology

## 2011-03-24 MED ORDER — IOHEXOL 300 MG/ML  SOLN
125.0000 mL | Freq: Once | INTRAMUSCULAR | Status: AC | PRN
  Administered 2011-03-24: 125 mL via INTRAVENOUS

## 2011-06-16 ENCOUNTER — Ambulatory Visit: Payer: Managed Care, Other (non HMO) | Attending: Anesthesiology

## 2011-06-16 DIAGNOSIS — IMO0001 Reserved for inherently not codable concepts without codable children: Secondary | ICD-10-CM | POA: Insufficient documentation

## 2011-06-16 DIAGNOSIS — M545 Low back pain, unspecified: Secondary | ICD-10-CM | POA: Insufficient documentation

## 2011-06-16 DIAGNOSIS — M2569 Stiffness of other specified joint, not elsewhere classified: Secondary | ICD-10-CM | POA: Insufficient documentation

## 2011-06-27 ENCOUNTER — Other Ambulatory Visit (HOSPITAL_COMMUNITY): Payer: Self-pay | Admitting: Gastroenterology

## 2011-06-29 ENCOUNTER — Ambulatory Visit (HOSPITAL_COMMUNITY)
Admission: RE | Admit: 2011-06-29 | Discharge: 2011-06-29 | Disposition: A | Discharge status: Home / Self Care | Payer: Managed Care, Other (non HMO) | Source: Ambulatory Visit | Attending: Gastroenterology | Admitting: Gastroenterology

## 2011-06-29 ENCOUNTER — Ambulatory Visit: Payer: Managed Care, Other (non HMO)

## 2011-06-29 ENCOUNTER — Encounter: Payer: Managed Care, Other (non HMO) | Attending: Gastroenterology | Admitting: *Deleted

## 2011-06-29 ENCOUNTER — Other Ambulatory Visit (HOSPITAL_COMMUNITY): Payer: Self-pay | Admitting: Gastroenterology

## 2011-06-29 DIAGNOSIS — Z713 Dietary counseling and surveillance: Secondary | ICD-10-CM | POA: Insufficient documentation

## 2011-06-29 DIAGNOSIS — Z438 Encounter for attention to other artificial openings: Secondary | ICD-10-CM | POA: Insufficient documentation

## 2011-06-29 DIAGNOSIS — R633 Feeding difficulties, unspecified: Secondary | ICD-10-CM | POA: Insufficient documentation

## 2011-06-29 DIAGNOSIS — R109 Unspecified abdominal pain: Secondary | ICD-10-CM | POA: Insufficient documentation

## 2011-06-29 MED ORDER — IOHEXOL 300 MG/ML  SOLN
50.0000 mL | Freq: Once | INTRAMUSCULAR | Status: AC | PRN
  Administered 2011-06-29: 20 mL

## 2011-06-30 ENCOUNTER — Encounter: Payer: Self-pay | Admitting: *Deleted

## 2011-06-30 ENCOUNTER — Other Ambulatory Visit (HOSPITAL_BASED_OUTPATIENT_CLINIC_OR_DEPARTMENT_OTHER): Payer: Self-pay | Admitting: Gastroenterology

## 2011-06-30 NOTE — Progress Notes (Signed)
  Medical Nutrition Therapy:  Appt start time: 1530 end time:  1615.  Assessment:  Primary concerns today: patient here with her husband for nutrition recommendation for tube feeding which they state was placed this AM. They have many questions regarding how to administer the feeding once determined, cleaning of the tubing, how to change out the tubing in 3 weeks and other maintenance questions. They described her ongoing symptoms with the pancreatitis including abdominal bloating after eating any foods and their attempt in limiting high fat foods in the meantime.   MEDICATIONS: see list   DIETARY INTAKE:  Usual eating pattern includes 3 meals and occasional snacks per day.  Everyday foods include variety of low fat foods.  Avoided foods include fats and fatty foods.    24-hr recall:  B ( AM): cereal with skim milk OR grits with oleo spray Snk ( AM): none stated  L ( PM): [place] sandwich on whole wheat bread Snk ( PM): none stated D ( PM): lean meat, vegetables as tolerated Snk ( PM): none stated Beverages: water  Usual physical activity: limited due to illnesses  Estimated energy needs: 2098 calories 265 g carbohydrates 70-120 g protein 56 - 70 g fat  Progress Towards Goal(s):  In progress.   Nutritional Diagnosis:  NB-1.1 Food and nutrition-related knowledge deficit As related to tube feeding administration.  As evidenced by new placement of Panda tube.    Intervention:  Nutrition assessment provided. Recommend use of Osmolite 1.2 enteral tube feeding @ 1776 ml/24 hours which provides 2100 calories, 275 grams carbohydrate, 98 grams protein and 68 grams fat.  Monitoring/Evaluation:  Recommend referral to [facility] for initiation and follow up of tube feeding. My phone number provided for any nutrition questions going forward.

## 2011-07-20 ENCOUNTER — Other Ambulatory Visit (HOSPITAL_COMMUNITY): Payer: Self-pay | Admitting: Gastroenterology

## 2011-07-20 DIAGNOSIS — T85598A Other mechanical complication of other gastrointestinal prosthetic devices, implants and grafts, initial encounter: Secondary | ICD-10-CM

## 2011-07-21 ENCOUNTER — Ambulatory Visit (HOSPITAL_COMMUNITY)
Admission: RE | Admit: 2011-07-21 | Discharge: 2011-07-21 | Disposition: A | Discharge status: Home / Self Care | Payer: Managed Care, Other (non HMO) | Source: Ambulatory Visit | Attending: Gastroenterology | Admitting: Gastroenterology

## 2011-07-21 ENCOUNTER — Other Ambulatory Visit (HOSPITAL_COMMUNITY): Payer: Self-pay | Admitting: Gastroenterology

## 2011-07-21 DIAGNOSIS — T85598A Other mechanical complication of other gastrointestinal prosthetic devices, implants and grafts, initial encounter: Secondary | ICD-10-CM

## 2011-07-21 DIAGNOSIS — T85898A Other specified complication of other internal prosthetic devices, implants and grafts, initial encounter: Secondary | ICD-10-CM | POA: Insufficient documentation

## 2011-07-21 DIAGNOSIS — Z431 Encounter for attention to gastrostomy: Secondary | ICD-10-CM | POA: Insufficient documentation

## 2011-07-21 DIAGNOSIS — X58XXXA Exposure to other specified factors, initial encounter: Secondary | ICD-10-CM | POA: Insufficient documentation

## 2011-07-21 MED ORDER — IOHEXOL 300 MG/ML  SOLN
50.0000 mL | Freq: Once | INTRAMUSCULAR | Status: AC | PRN
  Administered 2011-07-21: 20 mL

## 2011-07-24 ENCOUNTER — Ambulatory Visit (HOSPITAL_COMMUNITY)
Admission: RE | Admit: 2011-07-24 | Discharge: 2011-07-24 | Disposition: A | Discharge status: Home / Self Care | Payer: Managed Care, Other (non HMO) | Source: Ambulatory Visit | Attending: Gastroenterology | Admitting: Gastroenterology

## 2011-07-24 ENCOUNTER — Other Ambulatory Visit (HOSPITAL_COMMUNITY): Payer: Self-pay | Admitting: Gastroenterology

## 2011-07-24 DIAGNOSIS — R109 Unspecified abdominal pain: Secondary | ICD-10-CM | POA: Insufficient documentation

## 2011-07-24 DIAGNOSIS — R52 Pain, unspecified: Secondary | ICD-10-CM

## 2011-07-24 DIAGNOSIS — Z9089 Acquired absence of other organs: Secondary | ICD-10-CM | POA: Insufficient documentation

## 2011-07-24 DIAGNOSIS — Z981 Arthrodesis status: Secondary | ICD-10-CM | POA: Insufficient documentation

## 2011-07-26 ENCOUNTER — Encounter (HOSPITAL_COMMUNITY): Payer: Self-pay | Admitting: Emergency Medicine

## 2011-07-26 ENCOUNTER — Emergency Department (HOSPITAL_COMMUNITY): Payer: Managed Care, Other (non HMO)

## 2011-07-26 ENCOUNTER — Inpatient Hospital Stay (HOSPITAL_COMMUNITY)
Admission: EM | Admit: 2011-07-26 | Discharge: 2011-07-27 | DRG: 689 | Disposition: A | Discharge status: Home / Self Care | Payer: Managed Care, Other (non HMO) | Attending: Internal Medicine | Admitting: Internal Medicine

## 2011-07-26 DIAGNOSIS — Z8719 Personal history of other diseases of the digestive system: Secondary | ICD-10-CM

## 2011-07-26 DIAGNOSIS — K219 Gastro-esophageal reflux disease without esophagitis: Secondary | ICD-10-CM | POA: Diagnosis present

## 2011-07-26 DIAGNOSIS — D72829 Elevated white blood cell count, unspecified: Secondary | ICD-10-CM

## 2011-07-26 DIAGNOSIS — K861 Other chronic pancreatitis: Secondary | ICD-10-CM | POA: Diagnosis present

## 2011-07-26 DIAGNOSIS — R509 Fever, unspecified: Secondary | ICD-10-CM | POA: Diagnosis present

## 2011-07-26 DIAGNOSIS — N200 Calculus of kidney: Secondary | ICD-10-CM | POA: Diagnosis present

## 2011-07-26 DIAGNOSIS — J40 Bronchitis, not specified as acute or chronic: Secondary | ICD-10-CM | POA: Diagnosis present

## 2011-07-26 DIAGNOSIS — N179 Acute kidney failure, unspecified: Secondary | ICD-10-CM | POA: Diagnosis present

## 2011-07-26 DIAGNOSIS — I951 Orthostatic hypotension: Secondary | ICD-10-CM

## 2011-07-26 DIAGNOSIS — D649 Anemia, unspecified: Secondary | ICD-10-CM

## 2011-07-26 DIAGNOSIS — E876 Hypokalemia: Secondary | ICD-10-CM

## 2011-07-26 DIAGNOSIS — B012 Varicella pneumonia: Secondary | ICD-10-CM

## 2011-07-26 DIAGNOSIS — E039 Hypothyroidism, unspecified: Secondary | ICD-10-CM

## 2011-07-26 DIAGNOSIS — E782 Mixed hyperlipidemia: Secondary | ICD-10-CM

## 2011-07-26 DIAGNOSIS — R109 Unspecified abdominal pain: Secondary | ICD-10-CM | POA: Diagnosis present

## 2011-07-26 DIAGNOSIS — K863 Pseudocyst of pancreas: Secondary | ICD-10-CM

## 2011-07-26 DIAGNOSIS — F4541 Pain disorder exclusively related to psychological factors: Secondary | ICD-10-CM

## 2011-07-26 DIAGNOSIS — E079 Disorder of thyroid, unspecified: Secondary | ICD-10-CM | POA: Diagnosis present

## 2011-07-26 DIAGNOSIS — G9341 Metabolic encephalopathy: Secondary | ICD-10-CM | POA: Diagnosis present

## 2011-07-26 DIAGNOSIS — N390 Urinary tract infection, site not specified: Principal | ICD-10-CM | POA: Diagnosis present

## 2011-07-26 DIAGNOSIS — F329 Major depressive disorder, single episode, unspecified: Secondary | ICD-10-CM

## 2011-07-26 DIAGNOSIS — K862 Cyst of pancreas: Secondary | ICD-10-CM | POA: Diagnosis present

## 2011-07-26 HISTORY — DX: Pseudocyst of pancreas: K86.3

## 2011-07-26 LAB — CBC
HCT: 32.1 % — ABNORMAL LOW (ref 36.0–46.0)
MCH: 26.8 pg (ref 26.0–34.0)
MCHC: 31.8 g/dL (ref 30.0–36.0)
MCV: 84.5 fL (ref 78.0–100.0)
RDW: 13.7 % (ref 11.5–15.5)

## 2011-07-26 LAB — POCT I-STAT TROPONIN I: Troponin i, poc: 0 ng/mL (ref 0.00–0.08)

## 2011-07-26 LAB — URINE MICROSCOPIC-ADD ON

## 2011-07-26 LAB — TYPE AND SCREEN
ABO/RH(D): B POS
Antibody Screen: NEGATIVE

## 2011-07-26 LAB — COMPREHENSIVE METABOLIC PANEL
AST: 15 U/L (ref 0–37)
Albumin: 3.4 g/dL — ABNORMAL LOW (ref 3.5–5.2)
BUN: 16 mg/dL (ref 6–23)
Calcium: 8.2 mg/dL — ABNORMAL LOW (ref 8.4–10.5)
Creatinine, Ser: 1.15 mg/dL — ABNORMAL HIGH (ref 0.50–1.10)
GFR calc non Af Amer: 57 mL/min — ABNORMAL LOW (ref >90)

## 2011-07-26 LAB — APTT: aPTT: 33 seconds (ref 24–37)

## 2011-07-26 LAB — DIFFERENTIAL
Basophils Absolute: 0 10*3/uL (ref 0.0–0.1)
Basophils Relative: 0 % (ref 0–1)
Eosinophils Relative: 2 % (ref 0–5)
Monocytes Absolute: 1.1 10*3/uL — ABNORMAL HIGH (ref 0.1–1.0)

## 2011-07-26 LAB — LACTIC ACID, PLASMA: Lactic Acid, Venous: 0.7 mmol/L (ref 0.5–2.2)

## 2011-07-26 LAB — URINALYSIS, ROUTINE W REFLEX MICROSCOPIC
Hgb urine dipstick: NEGATIVE
Specific Gravity, Urine: 1.007 (ref 1.005–1.030)
Urobilinogen, UA: 0.2 mg/dL (ref 0.0–1.0)

## 2011-07-26 LAB — LIPASE, BLOOD: Lipase: 13 U/L (ref 11–59)

## 2011-07-26 LAB — PROTIME-INR: Prothrombin Time: 14.2 seconds (ref 11.6–15.2)

## 2011-07-26 MED ORDER — POTASSIUM CHLORIDE CRYS ER 20 MEQ PO TBCR
40.0000 meq | EXTENDED_RELEASE_TABLET | Freq: Once | ORAL | Status: AC
  Administered 2011-07-26: 40 meq via ORAL
  Filled 2011-07-26: qty 2

## 2011-07-26 MED ORDER — DEXTROSE 5 % IV SOLN
1.0000 g | INTRAVENOUS | Status: DC
  Administered 2011-07-26: 1 g via INTRAVENOUS
  Filled 2011-07-26 (×2): qty 10

## 2011-07-26 MED ORDER — SODIUM CHLORIDE 0.9 % IV SOLN
1000.0000 mL | INTRAVENOUS | Status: DC
  Administered 2011-07-26: 1000 mL via INTRAVENOUS

## 2011-07-26 MED ORDER — SODIUM CHLORIDE 0.9 % IV SOLN
1000.0000 mL | Freq: Once | INTRAVENOUS | Status: AC
  Administered 2011-07-26: 1000 mL via INTRAVENOUS

## 2011-07-26 MED ORDER — NALOXONE HCL 1 MG/ML IJ SOLN
INTRAMUSCULAR | Status: AC
  Filled 2011-07-26: qty 2

## 2011-07-26 MED ORDER — NALOXONE HCL 0.4 MG/ML IJ SOLN
INTRAMUSCULAR | Status: AC
  Filled 2011-07-26: qty 2

## 2011-07-26 NOTE — ED Notes (Signed)
Pt had a ng tube approx 4 wk ago due to pancreatitis and fri it was changed on fri. When pt was d/c home pt they called and said that it was displaced and to return back to the hospital. Pt arrived then they felt that it was in place and was d/c home. Then on yesterday it was pulled out some and nurse came over today and listened and it was in place. It was used and pt has been running fever and has been getting worse per family decreased loc, currently pt is sleepy and husband said that pt was given dilaudid and is currently altered.

## 2011-07-26 NOTE — H&P (Signed)
PCP:   Lupita Raider, MD, MD  GI=[facility] PA with Urology  Chief Complaint:  46 y/o Cf with multiple medical issues.  Has a h/o pancreatitis and last thurssday the [facility] tube got stoppe dup.  This was replaced by the Radiologist, and had to be replaced   Since then patient hasn;t felt as good as she usually dioes.  She had a rpt AXR done 5/13 which was normal.  Since last Friday she has conjtinuall gotten weaker and weaker  Husband has been able to use the tube to feed her and it has been "different" to her and she has been aching all over.  Went to see Dr. Clelia Croft this am-took her meds and stated to her husband "i feel like i am going to pass out"  She was seeing black spots, so went to see Dr. Clelia Croft and an Herby Abraham was done and state her lungs didn't sounfd very good and because of the paina nd other isseus was told to go to [employer].  Was given her 4 pm meds which include dilaudid 2 mg, robaxin and cymbalta 30, got a lot of phenergan at Dr. Alver Fisher --she usually doesn;t fall asleep like she has today.She has been asleep since she got here at about 5-6 pm.  Temp at Dr. Alver Fisher was 101.9.  Per husband she has been coughing since yesterday-no sputum really-threw u0p0 while at dr. Alver Fisher.  Usually is able to take clear liquids /popsicles and water only.  Doesn't eat since surgery and tube placement  Scheduled to see Dr. Dulce Sellar on this Friday  Patient's husband describes significant feeding issues and notes that for the past month there've been having some issues with regards to the tube feeds and it has been difficult to coordinate but finally he understands how to proceed with this  Review of Systems:  The patient is unable at present time to give a complete review of systems given her somnolent state  Past Medical History: Past Medical History  Diagnosis Date  . Pancreatitis   . Depression   . GERD (gastroesophageal reflux disease)   . Thyroid disease   . Allergy   . Iron deficiency   .  Hypothyroidism   . Anemia   chronic pelvic pain Endometriosis Irritable bowel syndrome Recurrent UTI's Chronic bilateral renal stones Lumbar spondylosis L5-S1 c ant interbody fusion L5-S1 10/2010 4 level fusion of upper cervical spine vertebrae in 2011 at [facility] GS pancreatitis s/p Lap chole 01/09/11 AMS/Delirium dyslipidemia  Past surgical history: Past Surgical History  Procedure Date  . Cholecystectomy 10/12  . Neck surgery 07/2009  . Oophorectomy 11/2009  . Back surgery 10/2010  . Abdominal hysterectomy 10/1999  . Eus 03/01/2011    Procedure: UPPER ENDOSCOPIC ULTRASOUND (EUS) LINEAR;  Surgeon: Freddy Jaksch, MD;  Location: [facility];  Service: Endoscopy;  Laterality: N/A;  . Fine needle aspiration 03/01/2011    Procedure: FINE NEEDLE ASPIRATION (FNA) LINEAR;  Surgeon: Freddy Jaksch, MD;  Location: [facility];  Service: Endoscopy;  Laterality: N/A;    Medications: Prior to Admission medications   Medication Sig Start Date End Date Taking? Authorizing Provider  buPROPion (WELLBUTRIN XL) 300 MG 24 hr tablet Take 300 mg by mouth daily.  01/05/11  Yes Historical Provider, MD  DULoxetine (CYMBALTA) 30 MG capsule Take 30 mg by mouth daily.    Yes Historical Provider, MD  DULoxetine (CYMBALTA) 60 MG capsule Take 60 mg by mouth daily. Takes with 30mg  capsule   Yes Historical  Provider, MD  gabapentin (NEURONTIN) 300 MG capsule Take 600 mg by mouth 3 (three) times daily.  12/19/10  Yes Historical Provider, MD  hydrochlorothiazide (HYDRODIURIL) 25 MG tablet Take 25 mg by mouth daily.  10/30/10  Yes Historical Provider, MD  HYDROmorphone (DILAUDID) 2 MG tablet Take 2 mg by mouth 4 (four) times daily.   Yes Historical Provider, MD  loratadine (CLARITIN) 10 MG tablet Take 10 mg by mouth daily.     Yes Historical Provider, MD  magnesium hydroxide (MILK OF MAGNESIA) 400 MG/5ML suspension Take 30 mLs by mouth daily as needed. For constipation   Yes Historical Provider, MD    methocarbamol (ROBAXIN) 500 MG tablet Take 500 mg by mouth 3 (three) times daily.  01/02/11  Yes Historical Provider, MD  omeprazole (PRILOSEC) 40 MG capsule Take 1 by mouth daily 03/17/11  Yes Cheri Guppy, MD  PROCTOFOAM [facility] rectal foam Place 1 applicator rectally 2 (two) times daily as needed.  12/20/10  Yes Historical Provider, MD  promethazine (PHENERGAN) 25 MG tablet Take 25 mg by mouth every 6 (six) hours as needed. For nausea   Yes Historical Provider, MD  SYNTHROID 100 MCG tablet Take 100 mcg by mouth daily.  11/12/10  Yes Historical Provider, MD    Allergies:   Allergies  Allergen Reactions  . Zolpidem Tartrate Other (See Comments)    hallucinations    Social History:  reports that she has never smoked. She does not have any smokeless tobacco history on file. She reports that she does not drink alcohol or use illicit drugs.  Family History: Family History  Problem Relation Age of Onset  . Cancer Father     Physical Exam: Filed Vitals:   07/26/11 1812 07/26/11 1916 07/26/11 2028  BP: 98/53 90/48 101/67  Pulse: 72 66 62  Temp: 98.3 F (36.8 C)    TempSrc: Oral    Resp: 16    SpO2: 90% 100% 100%    HEENT-sleepy but arousable Caucasian female. Review of systems could not be listed from her specifically CHEST-clinically clear with no added sounds. Patient not able to cooperate to determine tactile vocal fremitus or resonance CARDS-S1-S2 no murmur rub or gallop ABD-soft but slightly tender in the right upper quadrant and epigastric region. Murphy's sign by exam is equivocal none nontender SKIN-lower extremities no specific swelling NEURO-Unable to get full neurological exam. Patient awakens briefly to my voice and knee tapping on the shoulder however the rest of exam is limited   Labs on Admission:   [facility] 07/26/11 1900  NA 138  K 2.9*  CL 100  CO2 29  GLUCOSE 105*  BUN 16  CREATININE 1.15*  CALCIUM 8.2*  MG --  PHOS --    Basename 07/26/11 1900   AST 15  ALT 14  ALKPHOS 70  BILITOT 0.2*  PROT 5.9*  ALBUMIN 3.4*    Basename 07/26/11 1900  LIPASE 13  AMYLASE --    Basename 07/26/11 1900  WBC 15.3*  NEUTROABS 11.4*  HGB 10.2*  HCT 32.1*  MCV 84.5  PLT 244   No results found for this basename: CKTOTAL:3,CKMB:3,CKMBINDEX:3,TROPONINI:3 in the last 72 hours No results found for this basename: TSH,T4TOTAL,FREET3,T3FREE,THYROIDAB in the last 72 hours No results found for this basename: VITAMINB12:2,FOLATE:2,FERRITIN:2,TIBC:2,IRON:2,RETICCTPCT:2 in the last 72 hours  Radiological Exams on Admission: Dg Abd 1 View  07/24/2011  *RADIOLOGY REPORT*  Clinical Data: Abdominal pain and bloating.  History of pancreatitis and recent feeding tube placement.  ABDOMEN - 1 VIEW  Comparison: 07/21/2011 radiographs.  Findings: There is contrast material within the colon which is not distended.  Feeding tube follows a course consistent with the tip being in the proximal jejunum.  Cholecystectomy clips and prior fusion at the lumbosacral junction are noted.  IMPRESSION: Feeding tube tip in the proximal jejunum.  No acute abdominal findings.  Original Report Authenticated By: Gerrianne Scale, M.D.   Dg Abd 1 View  07/21/2011  **ADDENDUM** CREATED: 07/21/2011 12:43:50  The patient returned for repositioning of the feeding tube.  Upon repeat fluoroscopy, the catheter has migrated distally within the small bowel.  The tip is now in the proximal jejunum.  **END ADDENDUM** SIGNED BY: Aubery Lapping. Dover, M.D.    07/21/2011  *RADIOLOGY REPORT*  Clinical Data: Feeding tube placement  ABDOMEN - 1 VIEW  Comparison: 06/29/2011  Findings: Feeding tube appears coiled at the proximal third portion of the duodenum with the tip reflected back to the region of the duodenal bulb. Contrast opacifies the second and proximal third portions of the duodenum. Surgical clips right upper quadrant question cholecystectomy.  IMPRESSION: Feeding tube is coiled at the third portion of  the duodenum with the tip reflected back to the duodenal bulb.  Original Report Authenticated By: Cyndie Chime, M.D.   Dg Abd 1 View  06/29/2011  *RADIOLOGY REPORT*  Clinical Data: Panda tube placement  ABDOMEN - 1 VIEW  Comparison: None.  Findings: The panda tube tip is at the fourth portion of the duodenum, in good position.  IMPRESSION: Panda tip in good position at the fourth portion of the duodenum.  Original Report Authenticated By: Brandon Melnick, M.D.   Dg Chest Portable 1 View  07/26/2011  *RADIOLOGY REPORT*  Clinical Data: Fever.  PORTABLE CHEST - 1 VIEW  Comparison: 07/26/2011  Findings: Feeding tube extends below the inferior margin of the today's chest radiograph.  Low lung volumes are present, causing crowding of the pulmonary vasculature.  Airway thickening may reflect bronchitis or reactive airways disease.  No airspace opacity is identified to suggest bacterial pneumonia pattern.  Cardiac and mediastinal contours appear unremarkable.  No pleural effusion observed.  IMPRESSION:  1. Airway thickening may reflect bronchitis or reactive airways disease.  No airspace opacity is identified to suggest bacterial pneumonia pattern.  Original Report Authenticated By: Dellia Cloud, M.D.   Dg Abd Portable 2v  07/26/2011  *RADIOLOGY REPORT*  Clinical Data: Fever.  Abdominal pain.  Panda feeding tube placement.  PORTABLE ABDOMEN - 2 VIEW  Comparison: 07/24/2011  Findings: On the upright view, the Panda feeding tube projects over the proximal jejunum.  Contrast medium noted in the colon.  Lumbosacral fixator noted.  Left side down lateral decubitus views of the abdomen demonstrate no free burden of gas or significant abnormal air-fluid levels.  No dilated small bowel observed.  IMPRESSION:  1.  Panda feeding tube tip projects in the proximal jejunum. 2.  No significant abnormal air-fluid levels or dilated small bowel.  Contrast medium noted in the colon.  Original Report Authenticated By: Dellia Cloud, M.D.   Dg Naso G Tube Plc W/fl-no Rad  06/29/2011  CLINICAL DATA: Abdominal pain   NASO G TUBE PLACEMENT WITH FLUORO  Fluoroscopy was utilized by the requesting physician.  No radiographic  interpretation.      Assessment/Plan  This is a pleasant 46 year old female multiple medical issues who presents mainly with  #1-leukocytosis-likely secondary to urinary tract infection vs likely chemical pneumonitis. Given the fact that she's also  had some coughing recently and her chest x-ray done in the emergency room shows some bronchitis and also inclined to think that this may be likely aspiration pneumonitis and will cover for usual pathogens-she's already received Rocephin in the emergency room and we'll add levofloxacin IV 750 every 24 hourly to this for community-acquired/chemical pneumonitis. Her lactate and Procalcitonin did not meet criteria for a severe sepsis although her progress at 0.23 leading [place] to believe this is possible sepsis. Her lipase was 13 and this is unlikely related to her history of pancreatitis. We will hold off on further abdominal imaging at this time  #2-metabolic encephalopathy secondary to fever + multiple additive medications inclusive of Phenergan, Dilaudid etc.-hold her regular by mouth medications including Robaxin, Dilaudid, Phenergan until patient awakens enough.  #3 history of pancreatitis with pseudocyst-patient had laparoscopic cholecystectomy 12/2010 and nutrition follows her for Panda tube placement. Dr. Dulce Sellar is to see her this Friday. Would consider having his input in her case and care if she is still here then  #4-Acute kidney injury, secondary to prerenal causes-could be a component of her toxic metabolic encephalopathy. We will hydrate.  #5-hypokalemia-this place but emergency room physician. We will also continue to monitor with a.m. labs.   >35 minutes  full code Discussed with husband at bedside plan of care and likely  course

## 2011-07-26 NOTE — ED Provider Notes (Signed)
History     CSN: [id number]  Arrival date & time 07/26/11  1757   First MD Initiated Contact with Patient 07/26/11 1822      Chief Complaint  Patient presents with  . Fever  . NG Tube Misplaced?      HPI The patient presents to the emergency room with complaints of abdominal pain, chest pain and fever. Patient has been having a complicated history associated with chronic pancreatitis associated with a pancreatic pseudocyst. These symptoms were associated with complications associated with gallbladder disease. Patient had been in the hospital for treatment of her pancreatitis and had a feeding tube placed. Patient has had some issues recently with the placement of the tube but recently had it checked 2 days ago and the placement was appropriate. Patient's husband states that today she started developing a fever and began having pain in her chest and abdomen. Patient presented to the emergency room very somnolent but the husband is gave her her Dilaudid prior to coming into the emergency room.  Patient complains of pain in her chest and upper abdomen. She has not had fever or dysuria.  Patient denies any headache or neck pain. Past Medical History  Diagnosis Date  . Pancreatitis   . Depression   . GERD (gastroesophageal reflux disease)   . Thyroid disease   . Allergy   . Iron deficiency   . Hypothyroidism   . Anemia     Past Surgical History  Procedure Date  . Cholecystectomy 10/12  . Neck surgery 07/2009  . Oophorectomy 11/2009  . Back surgery 10/2010  . Abdominal hysterectomy 10/1999  . Eus 03/01/2011    Procedure: UPPER ENDOSCOPIC ULTRASOUND (EUS) LINEAR;  Surgeon: Freddy Jaksch, MD;  Location: [facility];  Service: Endoscopy;  Laterality: N/A;  . Fine needle aspiration 03/01/2011    Procedure: FINE NEEDLE ASPIRATION (FNA) LINEAR;  Surgeon: Freddy Jaksch, MD;  Location: [facility];  Service: Endoscopy;  Laterality: N/A;    Family History  Problem Relation Age of  Onset  . Cancer Father     History  Substance Use Topics  . Smoking status: Never Smoker   . Smokeless tobacco: Not on file  . Alcohol Use: No    OB History    Grav Para Term Preterm Abortions TAB SAB Ect Mult Living                  Review of Systems  All other systems reviewed and are negative.    Allergies  Zolpidem tartrate  Home Medications   Current Outpatient Rx  Name Route Sig Dispense Refill  . BUPROPION HCL ER (XL) 300 MG PO TB24 Oral Take 300 mg by mouth daily.     . DULOXETINE HCL 30 MG PO CPEP Oral Take 30 mg by mouth daily.     . DULOXETINE HCL 60 MG PO CPEP Oral Take 60 mg by mouth daily. Takes with 30mg  capsule    . GABAPENTIN 300 MG PO CAPS Oral Take 600 mg by mouth 3 (three) times daily.     Marland Kitchen HYDROCHLOROTHIAZIDE 25 MG PO TABS Oral Take 25 mg by mouth daily.     Marland Kitchen HYDROMORPHONE HCL 2 MG PO TABS Oral Take 2 mg by mouth 4 (four) times daily.    Marland Kitchen LORATADINE 10 MG PO TABS Oral Take 10 mg by mouth daily.      Marland Kitchen MAGNESIUM HYDROXIDE 400 MG/5ML PO SUSP Oral Take 30 mLs by mouth daily as needed. For  constipation    . METHOCARBAMOL 500 MG PO TABS Oral Take 500 mg by mouth 3 (three) times daily.     Marland Kitchen OMEPRAZOLE 40 MG PO CPDR  Take 1 by mouth daily 30 capsule 0  . PROCTOFOAM HC 1-1 % RE FOAM Rectal Place 1 applicator rectally 2 (two) times daily as needed.     Marland Kitchen PROMETHAZINE HCL 25 MG PO TABS Oral Take 25 mg by mouth every 6 (six) hours as needed. For nausea    . SYNTHROID 100 MCG PO TABS Oral Take 100 mcg by mouth daily.       BP 98/53  Pulse 72  Temp(Src) 98.3 F (36.8 C) (Oral)  Resp 16  SpO2 90%  Physical Exam  Nursing note and vitals reviewed. Constitutional: No distress.       Obese  HENT:  Head: Normocephalic and atraumatic.  Right Ear: External ear normal.  Left Ear: External ear normal.       Small caliber feeding tube in right nares  Eyes: Conjunctivae are normal. Right eye exhibits no discharge. Left eye exhibits no discharge. No scleral  icterus.  Neck: Neck supple. No tracheal deviation present.  Cardiovascular: Normal rate, regular rhythm and intact distal pulses.   Pulmonary/Chest: Effort normal and breath sounds normal. No stridor. No respiratory distress. She has no wheezes. She has no rales. She exhibits tenderness.  Abdominal: Soft. Bowel sounds are normal. She exhibits no distension. There is tenderness in the epigastric area. There is no rigidity, no rebound and no guarding. No hernia.  Musculoskeletal: She exhibits no edema and no tenderness.  Neurological: She is alert. She has normal strength. No sensory deficit. Cranial nerve deficit:  no gross defecits noted. She exhibits normal muscle tone. She displays no seizure activity. Coordination normal.  Skin: Skin is warm and dry. No rash noted.  Psychiatric: She has a normal mood and affect.    ED Course  Procedures (including critical care time)  Medications  DULoxetine (CYMBALTA) 60 MG capsule (not administered)  HYDROmorphone (DILAUDID) 2 MG tablet (not administered)  0.9 %  sodium chloride infusion (0 mL Intravenous Stopped 07/26/11 2024)    Followed by  0.9 %  sodium chloride infusion (0 mL Intravenous Stopped 07/26/11 2023)    Followed by  0.9 %  sodium chloride infusion (1000 mL Intravenous New Bag/Given 07/26/11 2024)  cefTRIAXone (ROCEPHIN) 1 g in dextrose 5 % 50 mL IVPB (not administered)  potassium chloride SA (K-DUR,KLOR-CON) CR tablet 40 mEq (40 mEq Oral Given 07/26/11 2116)    Labs Reviewed  CBC - Abnormal; Notable for the following:    WBC 15.3 (*)    RBC 3.80 (*)    Hemoglobin 10.2 (*)    HCT 32.1 (*)    All other components within normal limits  DIFFERENTIAL - Abnormal; Notable for the following:    Neutro Abs 11.4 (*)    Monocytes Absolute 1.1 (*)    All other components within normal limits  COMPREHENSIVE METABOLIC PANEL - Abnormal; Notable for the following:    Potassium 2.9 (*)    Glucose, Bld 105 (*)    Creatinine, Ser 1.15 (*)     Calcium 8.2 (*)    Total Protein 5.9 (*)    Albumin 3.4 (*)    Total Bilirubin 0.2 (*)    GFR calc non Af Amer 57 (*)    GFR calc Af Amer 66 (*)    All other components within normal limits  URINALYSIS, ROUTINE W REFLEX  MICROSCOPIC - Abnormal; Notable for the following:    APPearance CLOUDY (*)    Leukocytes, UA MODERATE (*)    All other components within normal limits  URINE MICROSCOPIC-ADD ON - Abnormal; Notable for the following:    Bacteria, UA MANY (*)    All other components within normal limits  LIPASE, BLOOD  LACTIC ACID, PLASMA  PROTIME-INR  APTT  TYPE AND SCREEN  PROCALCITONIN  POCT I-STAT TROPONIN I  POCT PREGNANCY, URINE   Dg Chest Portable 1 View  07/26/2011  *RADIOLOGY REPORT*  Clinical Data: Fever.  PORTABLE CHEST - 1 VIEW  Comparison: 07/26/2011  Findings: Feeding tube extends below the inferior margin of the today's chest radiograph.  Low lung volumes are present, causing crowding of the pulmonary vasculature.  Airway thickening may reflect bronchitis or reactive airways disease.  No airspace opacity is identified to suggest bacterial pneumonia pattern.  Cardiac and mediastinal contours appear unremarkable.  No pleural effusion observed.  IMPRESSION:  1. Airway thickening may reflect bronchitis or reactive airways disease.  No airspace opacity is identified to suggest bacterial pneumonia pattern.  Original Report Authenticated By: Dellia Cloud, M.D.   Dg Abd Portable 2v  07/26/2011  *RADIOLOGY REPORT*  Clinical Data: Fever.  Abdominal pain.  Panda feeding tube placement.  PORTABLE ABDOMEN - 2 VIEW  Comparison: 07/24/2011  Findings: On the upright view, the Panda feeding tube projects over the proximal jejunum.  Contrast medium noted in the colon.  Lumbosacral fixator noted.  Left side down lateral decubitus views of the abdomen demonstrate no free burden of gas or significant abnormal air-fluid levels.  No dilated small bowel observed.  IMPRESSION:  1.  Panda  feeding tube tip projects in the proximal jejunum. 2.  No significant abnormal air-fluid levels or dilated small bowel.  Contrast medium noted in the colon.  Original Report Authenticated By: Dellia Cloud, M.D.    MDM  Pt with chronic abdominal pain.  She has been somnolent here in the ED but easily arouses.  I suspect that is related to her opiate use.  Pt without fever however she does conitnue to have low BP.  Will start patient on IV abx.  With her hypokalemia, low BP and somnolence, pt will be admitted to the hospital for IV abx and further monitoring.         Celene Kras, MD 07/27/11 [phone]

## 2011-07-27 ENCOUNTER — Encounter (HOSPITAL_COMMUNITY): Payer: Self-pay | Admitting: Internal Medicine

## 2011-07-27 ENCOUNTER — Inpatient Hospital Stay (HOSPITAL_COMMUNITY): Payer: Managed Care, Other (non HMO)

## 2011-07-27 DIAGNOSIS — N390 Urinary tract infection, site not specified: Secondary | ICD-10-CM

## 2011-07-27 DIAGNOSIS — E782 Mixed hyperlipidemia: Secondary | ICD-10-CM

## 2011-07-27 DIAGNOSIS — Z8719 Personal history of other diseases of the digestive system: Secondary | ICD-10-CM | POA: Insufficient documentation

## 2011-07-27 DIAGNOSIS — E039 Hypothyroidism, unspecified: Secondary | ICD-10-CM | POA: Insufficient documentation

## 2011-07-27 DIAGNOSIS — F4541 Pain disorder exclusively related to psychological factors: Secondary | ICD-10-CM

## 2011-07-27 DIAGNOSIS — B012 Varicella pneumonia: Secondary | ICD-10-CM

## 2011-07-27 DIAGNOSIS — K219 Gastro-esophageal reflux disease without esophagitis: Secondary | ICD-10-CM | POA: Insufficient documentation

## 2011-07-27 DIAGNOSIS — G9341 Metabolic encephalopathy: Secondary | ICD-10-CM

## 2011-07-27 DIAGNOSIS — D72829 Elevated white blood cell count, unspecified: Secondary | ICD-10-CM

## 2011-07-27 DIAGNOSIS — I951 Orthostatic hypotension: Secondary | ICD-10-CM

## 2011-07-27 DIAGNOSIS — F329 Major depressive disorder, single episode, unspecified: Secondary | ICD-10-CM | POA: Insufficient documentation

## 2011-07-27 DIAGNOSIS — K863 Pseudocyst of pancreas: Secondary | ICD-10-CM | POA: Insufficient documentation

## 2011-07-27 DIAGNOSIS — F32A Depression, unspecified: Secondary | ICD-10-CM | POA: Insufficient documentation

## 2011-07-27 DIAGNOSIS — D649 Anemia, unspecified: Secondary | ICD-10-CM | POA: Insufficient documentation

## 2011-07-27 LAB — LEGIONELLA ANTIGEN, URINE

## 2011-07-27 LAB — CBC
HCT: 25.9 % — ABNORMAL LOW (ref 36.0–46.0)
Hemoglobin: 8.2 g/dL — ABNORMAL LOW (ref 12.0–15.0)
MCH: 27.3 pg (ref 26.0–34.0)
MCH: 26.8 pg (ref 26.0–34.0)
MCHC: 31.7 g/dL (ref 30.0–36.0)
Platelets: 254 10*3/uL (ref 150–400)
RBC: 4 MIL/uL (ref 3.87–5.11)
WBC: 11.8 10*3/uL — ABNORMAL HIGH (ref 4.0–10.5)

## 2011-07-27 LAB — COMPREHENSIVE METABOLIC PANEL
AST: 14 U/L (ref 0–37)
BUN: 13 mg/dL (ref 6–23)
CO2: 28 mEq/L (ref 19–32)
Chloride: 104 mEq/L (ref 96–112)
Creatinine, Ser: 0.96 mg/dL (ref 0.50–1.10)
GFR calc Af Amer: 82 mL/min — ABNORMAL LOW (ref >90)
GFR calc non Af Amer: 70 mL/min — ABNORMAL LOW (ref >90)
Glucose, Bld: 93 mg/dL (ref 70–99)
Total Bilirubin: 0.3 mg/dL (ref 0.3–1.2)

## 2011-07-27 LAB — HIV ANTIBODY (ROUTINE TESTING W REFLEX): HIV: NONREACTIVE

## 2011-07-27 LAB — EXPECTORATED SPUTUM ASSESSMENT W GRAM STAIN, RFLX TO RESP C

## 2011-07-27 MED ORDER — ENOXAPARIN SODIUM 30 MG/0.3ML ~~LOC~~ SOLN: 30.0000 mg | SUBCUTANEOUS | Status: DC

## 2011-07-27 MED ORDER — LEVOTHYROXINE SODIUM 100 MCG PO TABS
100.0000 ug | ORAL_TABLET | Freq: Every day | ORAL | Status: DC
  Administered 2011-07-27: 100 ug via ORAL
  Filled 2011-07-27: qty 1

## 2011-07-27 MED ORDER — HYDROCOD POLST-CHLORPHEN POLST 10-8 MG/5ML PO LQCR: 5.0000 mL | Freq: Two times a day (BID) | ORAL | Status: DC | PRN

## 2011-07-27 MED ORDER — LEVOFLOXACIN IN D5W 750 MG/150ML IV SOLN
750.0000 mg | Freq: Every day | INTRAVENOUS | Status: DC
  Administered 2011-07-27: 750 mg via INTRAVENOUS
  Filled 2011-07-27 (×2): qty 150

## 2011-07-27 MED ORDER — HYDROCOD POLST-CHLORPHEN POLST 10-8 MG/5ML PO LQCR
5.0000 mL | Freq: Two times a day (BID) | ORAL | Status: DC | PRN
  Administered 2011-07-27 (×2): 5 mL via ORAL
  Filled 2011-07-27 (×2): qty 5

## 2011-07-27 MED ORDER — BUPROPION HCL ER (XL) 300 MG PO TB24
300.0000 mg | ORAL_TABLET | Freq: Every day | ORAL | Status: DC
  Administered 2011-07-27: 300 mg via ORAL
  Filled 2011-07-27: qty 1

## 2011-07-27 MED ORDER — DIAZEPAM 5 MG PO TABS: 10.0000 mg | ORAL_TABLET | Freq: Two times a day (BID) | ORAL | Status: DC

## 2011-07-27 MED ORDER — HYDROCORTISONE ACE-PRAMOXINE 1-1 % RE FOAM
1.0000 | Freq: Four times a day (QID) | RECTAL | Status: DC
  Filled 2011-07-27: qty 10

## 2011-07-27 MED ORDER — MENTHOL 3 MG MT LOZG
1.0000 | LOZENGE | OROMUCOSAL | Status: DC | PRN
  Filled 2011-07-27: qty 9

## 2011-07-27 MED ORDER — DEXTROSE 5 % IV SOLN: 1.0000 g | INTRAVENOUS | Status: DC

## 2011-07-27 MED ORDER — OSMOLITE 1.2 CAL PO LIQD
237.0000 mL | Freq: Three times a day (TID) | ORAL | Status: DC
  Filled 2011-07-27: qty 237

## 2011-07-27 MED ORDER — HYDROMORPHONE HCL PF 1 MG/ML IJ SOLN
0.5000 mg | INTRAMUSCULAR | Status: DC | PRN
  Administered 2011-07-27 (×2): 0.5 mg via INTRAVENOUS
  Filled 2011-07-27 (×2): qty 1

## 2011-07-27 MED ORDER — SODIUM CHLORIDE 0.9 % IV SOLN
INTRAVENOUS | Status: DC
  Administered 2011-07-27 (×2): via INTRAVENOUS

## 2011-07-27 MED ORDER — DULOXETINE HCL 30 MG PO CPEP: 30.0000 mg | ORAL_CAPSULE | Freq: Every day | ORAL | Status: DC

## 2011-07-27 MED ORDER — SODIUM CHLORIDE 0.9 % IJ SOLN
3.0000 mL | Freq: Two times a day (BID) | INTRAMUSCULAR | Status: DC
  Administered 2011-07-27: 3 mL via INTRAVENOUS

## 2011-07-27 MED ORDER — DULOXETINE HCL 60 MG PO CPEP
60.0000 mg | ORAL_CAPSULE | Freq: Every day | ORAL | Status: DC
  Administered 2011-07-27: 60 mg via ORAL
  Filled 2011-07-27: qty 1

## 2011-07-27 MED ORDER — ONDANSETRON HCL 4 MG/2ML IJ SOLN
4.0000 mg | Freq: Four times a day (QID) | INTRAMUSCULAR | Status: DC | PRN
  Administered 2011-07-27: 4 mg via INTRAVENOUS
  Filled 2011-07-27: qty 2

## 2011-07-27 MED ORDER — POTASSIUM CHLORIDE 10 MEQ/100ML IV SOLN
10.0000 meq | INTRAVENOUS | Status: AC
  Administered 2011-07-27 (×2): 10 meq via INTRAVENOUS
  Filled 2011-07-27 (×3): qty 100

## 2011-07-27 MED ORDER — LEVOFLOXACIN 500 MG PO TABS: 500.0000 mg | ORAL_TABLET | Freq: Every day | ORAL | Status: AC

## 2011-07-27 MED ORDER — ENOXAPARIN SODIUM 40 MG/0.4ML ~~LOC~~ SOLN
40.0000 mg | SUBCUTANEOUS | Status: DC
  Administered 2011-07-27: 40 mg via SUBCUTANEOUS
  Filled 2011-07-27: qty 0.4

## 2011-07-27 MED ORDER — HYDROMORPHONE HCL PF 1 MG/ML IJ SOLN
0.5000 mg | INTRAMUSCULAR | Status: DC | PRN
  Administered 2011-07-27: 0.5 mg via INTRAVENOUS
  Filled 2011-07-27: qty 1

## 2011-07-27 NOTE — Progress Notes (Signed)
Please see d/c summary for today  [name]  [facility], pager #: [phone] Main office number: [phone]  

## 2011-07-27 NOTE — Discharge Summary (Signed)
Patient ID: MCKINLEY ADELSTEIN MRN: [id number] DOB/AGE: Oct 31, 1965 45 y.o.  Admit date: 07/26/2011 Discharge date: 07/27/2011  Primary Care Physician:  Lupita Raider, MD, MD  Discharge Diagnoses:  Abdominal pain secondary to UTI  Present on Admission:   Active Problems:  Leukocytosis  Metabolic encephalopathy  UTI (lower urinary tract infection)   Medication List  As of 07/27/2011  3:52 PM   TAKE these medications         buPROPion 300 MG 24 hr tablet   Commonly known as: WELLBUTRIN XL   Take 300 mg by mouth daily.      chlorpheniramine-HYDROcodone 10-8 MG/5ML Lqcr   Commonly known as: TUSSIONEX   Take 5 mLs by mouth every 12 (twelve) hours as needed.      DULoxetine 30 MG capsule   Commonly known as: CYMBALTA   Take 30 mg by mouth daily.      DULoxetine 60 MG capsule   Commonly known as: CYMBALTA   Take 60 mg by mouth daily. Takes with 30mg  capsule      gabapentin 300 MG capsule   Commonly known as: NEURONTIN   Take 600 mg by mouth 3 (three) times daily.      hydrochlorothiazide 25 MG tablet   Commonly known as: HYDRODIURIL   Take 25 mg by mouth daily.      HYDROmorphone 2 MG tablet   Commonly known as: DILAUDID   Take 2 mg by mouth 4 (four) times daily.      levofloxacin 500 MG tablet   Commonly known as: LEVAQUIN   Take 1 tablet (500 mg total) by mouth daily.      loratadine 10 MG tablet   Commonly known as: CLARITIN   Take 10 mg by mouth daily.      magnesium hydroxide 400 MG/5ML suspension   Commonly known as: MILK OF MAGNESIA   Take 30 mLs by mouth daily as needed. For constipation      methocarbamol 500 MG tablet   Commonly known as: ROBAXIN   Take 500 mg by mouth 3 (three) times daily.      omeprazole 40 MG capsule   Commonly known as: PRILOSEC   Take 1 by mouth daily      PROCTOFOAM HC rectal foam   Generic drug: hydrocortisone-pramoxine   Place 1 applicator rectally 2 (two) times daily as needed.      promethazine 25 MG tablet   Commonly known as: PHENERGAN   Take 25 mg by mouth every 6 (six) hours as needed. For nausea      SYNTHROID 100 MCG tablet   Generic drug: levothyroxine   Take 100 mcg by mouth daily.            Disposition and Follow-up: With PCP in 1-2 weeks.  Consults:  none  Studies/Results:  Ct Abdomen Pelvis Wo Contrast 07/27/2011     IMPRESSION:  Bilateral nephrolithiasis.  No ureteral stones or hydronephrosis.  Slight decreased soft tissue prominence in the midportion of the pancreas, presumably improving pseudocyst, but this cannot be fully characterized without intravenous contrast.   Prior cholecystectomy.   Dg Chest Portable 1 View 07/26/2011    IMPRESSION:   1. Airway thickening may reflect bronchitis or reactive airways disease.  No airspace opacity is identified to suggest bacterial pneumonia pattern.     Dg Abd Portable 2v 07/26/2011    IMPRESSION:   1.  Panda feeding tube tip projects in the proximal jejunum.  2.  No significant abnormal air-fluid levels  or dilated small bowel.  Contrast medium noted in the colon.     Brief H and P: 46 y/o female with multiple medical issues, has a h/o pancreatitis and last thurssday the [facility] tube got stopped. This was replaced by the Radiologist. Pt presented with lower quadrants pai with dysuria, urinary frequency and urgency. She also reports subjective fevers, chills, nausea and poor oral intake. She reports similar episodes in the past that were due to UTI. She denies any specific aggravating or alleviating factors.   Physical Exam on discharge:  Vital signs in last 24 hours:  Filed Vitals:   07/27/11 0152 07/27/11 0543 07/27/11 1400  BP: 122/80 117/82 107/69  Pulse: 67 76 62  Temp: 98.1 F (36.7 C) 97.5 F (36.4 C) 98 F (36.7 C)  SpO2: 99% 97% 99%   Intake/Output from previous day:   Intake/Output Summary (Last 24 hours) at 07/27/11 1632 Last data filed at 07/27/11 1300  Gross per 24 hour  Intake 2453.33 ml  Output     900 ml  Net 1553.33 ml   Physical Exam: General: Alert, awake, oriented x3, in no acute distress. HEENT: No bruits, no goiter. Moist mucous membranes, no scleral icterus, no conjunctival pallor. NGT in place Heart: Regular rate and rhythm, S1/S2 +, no murmurs, rubs, gallops. Lungs: Clear to auscultation bilaterally. No wheezing, no rhonchi, no rales.  Abdomen: Soft, nontender, nondistended, positive bowel sounds. Extremities: No clubbing or cyanosis, no pitting edema,  positive pedal pulses. Neuro: Grossly nonfocal.   Lab 07/27/11 0545 07/27/11 0058 07/26/11 1900  WBC 11.8* 11.0* 15.3*  HGB 10.7* 8.2* 10.2*  HCT 34.6* 25.9* 32.1*  PLT 254 183 244    Lab 07/27/11 0545 07/27/11 0058 07/26/11 1900  NA 140 -- 138  K 3.5 -- 2.9*  CL 104 -- 100  CO2 28 -- 29  GLUCOSE 93 -- 105*  BUN 13 -- 16  CREATININE 0.96 0.68 1.15*  CALCIUM 8.8 -- 8.2*    Lab 07/26/11 1900  INR 1.08  PROTIME --    Hospital Course:  Active Problems:  Leukocytosis - most likely secondary to UTI - will need to continue ABX for 7 additional days and follow up with PCP   UTI (lower urinary tract infection) - per UA and symptoms - now clinically improving - will continue antibiotic as noted above - pt will have to follow up with PCP in 1-2 weeks post discharge - please note that CT abdomen did not show obstruction or hydronephrosis  Bronchitis - noted on imaging studies - current antibiotic should cover as well - please note that pt has been maintaining oxygen saturation > 95% on RA  Time spent on Discharge: Over 30 minutes  Signed: Debbora Presto 07/27/2011, 3:52 PM  [facility], pager #: [phone] Main office number: [phone]

## 2011-07-27 NOTE — Progress Notes (Signed)
INITIAL ADULT NUTRITION ASSESSMENT Date: 07/27/2011   Time: 1:00 PM Reason for Assessment: Nutrition Risk for home tube feeding  ASSESSMENT: Female 46 y.o.  Dx: Pancreatitis and Panda tube out of place  Hx:  Past Medical History  Diagnosis Date  . Pancreatitis   . Depression   . GERD (gastroesophageal reflux disease)   . Thyroid disease   . Allergy   . Iron deficiency   . Hypothyroidism   . Anemia   . Pancreatic pseudocyst     Related Meds:  Scheduled Meds:   . sodium chloride  1,000 mL Intravenous Once   Followed by  . sodium chloride  1,000 mL Intravenous Once  . buPROPion  300 mg Oral Daily  . cefTRIAXone (ROCEPHIN)  IV  1 g Intravenous Q24H  . DULoxetine  60 mg Oral Daily  . enoxaparin  40 mg Subcutaneous Q24H  . hydrocortisone-pramoxine  1 applicator Rectal QID  . levofloxacin (LEVAQUIN) IV  750 mg Intravenous QHS  . levothyroxine  100 mcg Oral Daily  . potassium chloride  10 mEq Intravenous Q1 Hr x 3  . potassium chloride  40 mEq Oral Once  . sodium chloride  3 mL Intravenous Q12H  . DISCONTD: cefTRIAXone (ROCEPHIN)  IV  1 g Intravenous Q24H  . DISCONTD: DULoxetine  30 mg Oral Daily  . DISCONTD: enoxaparin  30 mg Subcutaneous Q24H   Continuous Infusions:   . sodium chloride Stopped (07/27/11 0043)  . sodium chloride 50 mL/hr at 07/27/11 0241   PRN Meds:.chlorpheniramine-HYDROcodone, HYDROmorphone (DILAUDID) injection, menthol-cetylpyridinium, ondansetron, DISCONTD:  HYDROmorphone (DILAUDID) injection   Ht: 5' 4.5" (163.8 cm)  Wt: 216 lb 9.6 oz (98.249 kg)  Ideal Wt: 54.5 kg % Ideal Wt: 180%  Wt Readings from Last 10 Encounters:  07/27/11 216 lb 9.6 oz (98.249 kg)  06/30/11 229 lb 14.4 oz (104.282 kg)  03/01/11 200 lb (90.719 kg)  03/01/11 200 lb (90.719 kg)  01/26/11 214 lb 3.2 oz (97.16 kg)    BMI: 37.07 kg/m^2 (Obesity class II)  Food/Nutrition Related Hx: Patient reported that the goal of her home TF regimen is [phone] kcal. She reported  she administers 3 cans of Osmolite 1.2 via Panda tube daily. Spoke with NP, she stated patient was able to resume home TF regimen.   Labs:  CMP     Component Value Date/Time   NA 140 07/27/2011 0545   K 3.5 07/27/2011 0545   CL 104 07/27/2011 0545   CO2 28 07/27/2011 0545   GLUCOSE 93 07/27/2011 0545   BUN 13 07/27/2011 0545   CREATININE 0.96 07/27/2011 0545   CALCIUM 8.8 07/27/2011 0545   PROT 6.3 07/27/2011 0545   ALBUMIN 3.4* 07/27/2011 0545   AST 14 07/27/2011 0545   ALT 14 07/27/2011 0545   ALKPHOS 77 07/27/2011 0545   BILITOT 0.3 07/27/2011 0545   GFRNONAA 70* 07/27/2011 0545   GFRAA 82* 07/27/2011 0545    Intake/Output Summary (Last 24 hours) at 07/27/11 1341 Last data filed at 07/27/11 1300  Gross per 24 hour  Intake 2453.33 ml  Output    400 ml  Net 2053.33 ml     Diet Order: Clear Liquid  Supplements/Tube Feeding: none at this time  IVF:    sodium chloride Last Rate: Stopped (07/27/11 0043)  sodium chloride Last Rate: 50 mL/hr at 07/27/11 0241    Estimated Nutritional Needs:   Kcal: 4098-1191 Protein: 117.8-147 grams Fluid: 1 ml per kcal intake  NUTRITION DIAGNOSIS: -Inadequate oral intake (NI-2.1).  Status: Ongoing  RELATED TO: limited ability to eat  AS EVIDENCE BY: clear liquid diet restriction and home TF regimen not initiated.   MONITORING/EVALUATION(Goals): TF initiation and tolerance, PO intake, weight trends, labs 1. Initiate TF with positive tolerance. 2. Meet > 90% of estimated energy needs.  EDUCATION NEEDS: -No education needs identified at this time  INTERVENTION: 1. Initiate home TF regimen of 3 cans of Osmolite 1.2 daily via Panda tube. Provides 864 kcal, 40 grams of protein, and 590 ml free water.  2. RD to follow for nutrition needs.  Dietitian [phone]  DOCUMENTATION CODES Per approved criteria  -Obesity Unspecified    Iven Finn [facility] 07/27/2011, 1:00 PM

## 2011-07-27 NOTE — Discharge Instructions (Signed)

## 2011-07-27 NOTE — Care Management Note (Signed)
    Page 1 of 1   07/27/2011     4:55:56 PM   CARE MANAGEMENT NOTE 07/27/2011  Patient:  [name], [name]   Account Number:  [id number]  Date Initiated:  07/27/2011  Documentation initiated by:  Lanier Clam  Subjective/Objective Assessment:   ADMITTED W/LEUKOCYTOSIS     Action/Plan:   FROM HOME W/SPOUSE   Anticipated DC Date:  07/27/2011   Anticipated DC Plan:  HOME/SELF CARE      DC Planning Services  CM consult      Choice offered to / List presented to:             Status of service:  Completed, signed off Medicare Important Message given?   (If response is "NO", the following Medicare IM given date fields will be blank) Date Medicare IM given:   Date Additional Medicare IM given:    Discharge Disposition:  HOME/SELF CARE  Per UR Regulation:  Reviewed for med. necessity/level of care/duration of stay  If discussed at Long Length of Stay Meetings, dates discussed:    Comments:  07/27/11 [facility] RN,BSN NCM [phone] PATIENT WAS ACTIVE W/[facility] -HHRN.PATIENT DOES NOT THINK SHE WILL NEED HHRN.MD UPDATED.

## 2011-07-27 NOTE — Progress Notes (Signed)
Subjective: Lying in bed eyes closed arouses to verbal stimuli but very drowsy. Reports abd pain and requests clear liquid diet.   Objective: Vital signs Filed Vitals:   07/26/11 2028 07/26/11 2354 07/27/11 0152 07/27/11 0543  BP: 101/67 93/56 122/80 117/82  Pulse: 62 58 67 76  Temp:   98.1 F (36.7 C) 97.5 F (36.4 C)  TempSrc:   Oral Oral  Resp:  16 17 17   Height:   5' 4.5" (1.638 m)   Weight:   98.249 kg (216 lb 9.6 oz)   SpO2: 100% 100% 99% 97%   Weight change:  Last BM Date: 07/27/11  Intake/Output from previous day: 05/15 0701 - 05/16 0700 In: 2213.3 [I.V.:2213.3] Out: 400 [Urine:400]     Physical Exam: General: Somewhat somnolent but  oriented x3, in no acute distress. HEENT: No bruits, no goiter. Mucus membranes mouth slightly dry/pink. PERRL EOMI. Feeding tube right nare.  Heart: Regular rate and rhythm, without murmurs, rubs, gallops. Lungs: Normal effort but somewhat shallow. Breath sounds clear to auscultation bilaterally. No wheeze Abdomen:Obese,  Soft, moderate tenderness to palpation right quadrants and epigastric area. Nondistended, positive bowel sounds. Extremities: No clubbing cyanosis or edema with positive pedal pulses. Neuro: Grossly intact, nonfocal. MOE. Awakens easily and attempts to follow commands. Speech slow/deliberate.     Lab Results: Basic Metabolic Panel:  Basename 07/27/11 0545 07/27/11 0058 07/26/11 1900  NA 140 -- 138  K 3.5 -- 2.9*  CL 104 -- 100  CO2 28 -- 29  GLUCOSE 93 -- 105*  BUN 13 -- 16  CREATININE 0.96 0.68 --  CALCIUM 8.8 -- 8.2*  MG -- -- --  PHOS -- -- --   Liver Function Tests:  [facility] 07/27/11 0545 07/26/11 1900  AST 14 15  ALT 14 14  ALKPHOS 77 70  BILITOT 0.3 0.2*  PROT 6.3 5.9*  ALBUMIN 3.4* 3.4*    Basename 07/26/11 1900  LIPASE 13  AMYLASE --   No results found for this basename: AMMONIA:2 in the last 72 hours CBC:  Basename 07/27/11 0545 07/27/11 0058 07/26/11 1900  WBC 11.8* 11.0* --    NEUTROABS -- -- 11.4*  HGB 10.7* 8.2* --  HCT 34.6* 25.9* --  MCV 86.5 86.3 --  PLT 254 183 --   Cardiac Enzymes: No results found for this basename: CKTOTAL:3,CKMB:3,CKMBINDEX:3,TROPONINI:3 in the last 72 hours BNP: No results found for this basename: PROBNP:3 in the last 72 hours D-Dimer: No results found for this basename: DDIMER:2 in the last 72 hours CBG: No results found for this basename: GLUCAP:6 in the last 72 hours Hemoglobin A1C: No results found for this basename: HGBA1C in the last 72 hours Fasting Lipid Panel: No results found for this basename: CHOL,HDL,LDLCALC,TRIG,CHOLHDL,LDLDIRECT in the last 72 hours Thyroid Function Tests: No results found for this basename: TSH,T4TOTAL,FREET4,T3FREE,THYROIDAB in the last 72 hours Anemia Panel: No results found for this basename: VITAMINB12,FOLATE,FERRITIN,TIBC,IRON,RETICCTPCT in the last 72 hours Coagulation:  Basename 07/26/11 1900  LABPROT 14.2  INR 1.08   Urine Drug Screen: Drugs of Abuse  No results found for this basename: labopia, cocainscrnur, labbenz, amphetmu, thcu, labbarb    Alcohol Level: No results found for this basename: ETH:2 in the last 72 hours Urinalysis:  Basename 07/26/11 2120  COLORURINE YELLOW  LABSPEC 1.007  PHURINE 6.5  GLUCOSEU NEGATIVE  HGBUR NEGATIVE  BILIRUBINUR NEGATIVE  KETONESUR NEGATIVE  PROTEINUR NEGATIVE  UROBILINOGEN 0.2  NITRITE NEGATIVE  LEUKOCYTESUR MODERATE*   Misc. Labs:  No results found for  this or any previous visit (from the past 240 hour(s)).  Studies/Results: Dg Chest Portable 1 View  07/26/2011  *RADIOLOGY REPORT*  Clinical Data: Fever.  PORTABLE CHEST - 1 VIEW  Comparison: 07/26/2011  Findings: Feeding tube extends below the inferior margin of the today's chest radiograph.  Low lung volumes are present, causing crowding of the pulmonary vasculature.  Airway thickening may reflect bronchitis or reactive airways disease.  No airspace opacity is identified to  suggest bacterial pneumonia pattern.  Cardiac and mediastinal contours appear unremarkable.  No pleural effusion observed.  IMPRESSION:  1. Airway thickening may reflect bronchitis or reactive airways disease.  No airspace opacity is identified to suggest bacterial pneumonia pattern.  Original Report Authenticated By: Dellia Cloud, M.D.   Dg Abd Portable 2v  07/26/2011  *RADIOLOGY REPORT*  Clinical Data: Fever.  Abdominal pain.  Panda feeding tube placement.  PORTABLE ABDOMEN - 2 VIEW  Comparison: 07/24/2011  Findings: On the upright view, the Panda feeding tube projects over the proximal jejunum.  Contrast medium noted in the colon.  Lumbosacral fixator noted.  Left side down lateral decubitus views of the abdomen demonstrate no free burden of gas or significant abnormal air-fluid levels.  No dilated small bowel observed.  IMPRESSION:  1.  Panda feeding tube tip projects in the proximal jejunum. 2.  No significant abnormal air-fluid levels or dilated small bowel.  Contrast medium noted in the colon.  Original Report Authenticated By: Dellia Cloud, M.D.    Medications: Scheduled Meds:   . sodium chloride  1,000 mL Intravenous Once   Followed by  . sodium chloride  1,000 mL Intravenous Once  . buPROPion  300 mg Oral Daily  . cefTRIAXone (ROCEPHIN)  IV  1 g Intravenous Q24H  . DULoxetine  60 mg Oral Daily  . enoxaparin  40 mg Subcutaneous Q24H  . hydrocortisone-pramoxine  1 applicator Rectal QID  . levofloxacin (LEVAQUIN) IV  750 mg Intravenous QHS  . levothyroxine  100 mcg Oral Daily  . potassium chloride  40 mEq Oral Once  . sodium chloride  3 mL Intravenous Q12H  . DISCONTD: cefTRIAXone (ROCEPHIN)  IV  1 g Intravenous Q24H  . DISCONTD: DULoxetine  30 mg Oral Daily  . DISCONTD: enoxaparin  30 mg Subcutaneous Q24H   Continuous Infusions:   . sodium chloride Stopped (07/27/11 0043)  . sodium chloride 50 mL/hr at 07/27/11 0241   PRN Meds:.chlorpheniramine-HYDROcodone,  HYDROmorphone (DILAUDID) injection, menthol-cetylpyridinium, ondansetron  Assessment/Plan:  Active Problems:  Leukocytosis  Metabolic encephalopathy  UTI (lower urinary tract infection)  #1-leukocytosis- llikely secondary to urinary tract infection vs likely chemical pneumonitis vs aspiration pneumonitis vs bronchitis. WC trending up slightly. Afebrile. VSS.  Will continue Rocephin and Levaquin day #2 for community-acquired/chemical pneumonitis. Her lactate and Procalcitonin did not meet criteria for a severe sepsis although her progress at 0.23 leading [place] to believe this is possible sepsis. Her lipase was 13 and this is unlikely related to her history of pancreatitis. Will hold off on  clear liquids until more alert.  #2-metabolic encephalopathy multiple  medications inclusive of Phenergan, Dilaudid etc. Remains somnolent but received dilaudid this am. Will discontinue dilaudid. Will also get UDS as feel this is medication related. Continue to hold her regular by mouth medications including Robaxin, Dilaudid, Phenergan until patient awakens enough.  #3 history of pancreatitis with pseudocyst-patient had laparoscopic cholecystectomy 12/2010 and nutrition follows her for Panda tube placement. Dr. Dulce Sellar is to see her this Friday. Will notify Dr Malena Edman  office.   #4-Acute kidney injury, secondary to prerenal causes- improved with IV fluids. Creatinine 0.96 this am. Continue IV fluids.  #5-hypokalemia. At lower end of normal. Will replete and recheck.       LOS: 1 day   [facility] 07/27/2011, 8:01 AM

## 2011-07-28 LAB — URINE DRUGS OF ABUSE SCREEN W ALC, ROUTINE (REF LAB)
Barbiturate Quant, Ur: NEGATIVE
Benzodiazepines.: POSITIVE — AB
Marijuana Metabolite: NEGATIVE
Methadone: NEGATIVE
Propoxyphene: NEGATIVE

## 2011-07-28 NOTE — Progress Notes (Signed)
Discharge summary sent to payer through [facility]  

## 2011-07-29 LAB — CULTURE, RESPIRATORY W GRAM STAIN: Culture: NORMAL

## 2011-08-01 LAB — BENZODIAZEPINE, QUANTITATIVE, URINE
Alprazolam (GC/LC/MS), ur confirm: NEGATIVE NG/ML
Nordiazepam GC/MS Conf: 1012 NG/ML — ABNORMAL HIGH
Oxazepam GC/MS Conf: 4068 NG/ML — ABNORMAL HIGH

## 2011-08-02 LAB — CULTURE, BLOOD (ROUTINE X 2)
Culture  Setup Time: 201305160304
Culture  Setup Time: 201305160304

## 2011-08-11 ENCOUNTER — Ambulatory Visit (HOSPITAL_COMMUNITY)
Admission: RE | Admit: 2011-08-11 | Discharge: 2011-08-11 | Disposition: A | Discharge status: Home / Self Care | Payer: Managed Care, Other (non HMO) | Source: Ambulatory Visit | Attending: Gastroenterology | Admitting: Gastroenterology

## 2011-08-11 ENCOUNTER — Other Ambulatory Visit (HOSPITAL_COMMUNITY): Payer: Self-pay | Admitting: Gastroenterology

## 2011-08-11 DIAGNOSIS — K859 Acute pancreatitis without necrosis or infection, unspecified: Secondary | ICD-10-CM

## 2011-08-11 DIAGNOSIS — Z431 Encounter for attention to gastrostomy: Secondary | ICD-10-CM | POA: Insufficient documentation

## 2011-08-11 MED ORDER — IOHEXOL 300 MG/ML  SOLN
10.0000 mL | Freq: Once | INTRAMUSCULAR | Status: AC | PRN
  Administered 2011-08-11: 10 mL via INTRAVENOUS

## 2011-08-28 ENCOUNTER — Other Ambulatory Visit: Payer: Self-pay | Admitting: Gastroenterology

## 2011-08-28 DIAGNOSIS — K859 Acute pancreatitis without necrosis or infection, unspecified: Secondary | ICD-10-CM

## 2011-08-29 ENCOUNTER — Ambulatory Visit
Admission: RE | Admit: 2011-08-29 | Discharge: 2011-08-29 | Disposition: A | Discharge status: Home / Self Care | Payer: Managed Care, Other (non HMO) | Source: Ambulatory Visit | Attending: Gastroenterology | Admitting: Gastroenterology

## 2011-08-29 DIAGNOSIS — K859 Acute pancreatitis without necrosis or infection, unspecified: Secondary | ICD-10-CM

## 2011-08-29 MED ORDER — IOHEXOL 300 MG/ML  SOLN
125.0000 mL | Freq: Once | INTRAMUSCULAR | Status: AC | PRN
  Administered 2011-08-29: 125 mL via INTRAVENOUS

## 2011-09-19 ENCOUNTER — Other Ambulatory Visit: Payer: Self-pay | Admitting: Gastroenterology

## 2011-09-19 ENCOUNTER — Ambulatory Visit
Admission: RE | Admit: 2011-09-19 | Discharge: 2011-09-19 | Disposition: A | Discharge status: Home / Self Care | Payer: Managed Care, Other (non HMO) | Source: Ambulatory Visit | Attending: Gastroenterology | Admitting: Gastroenterology

## 2011-09-19 DIAGNOSIS — R109 Unspecified abdominal pain: Secondary | ICD-10-CM

## 2011-10-02 ENCOUNTER — Other Ambulatory Visit: Payer: Self-pay | Admitting: Gastroenterology

## 2011-10-02 DIAGNOSIS — R112 Nausea with vomiting, unspecified: Secondary | ICD-10-CM

## 2011-10-11 ENCOUNTER — Ambulatory Visit
Admission: RE | Admit: 2011-10-11 | Discharge: 2011-10-11 | Disposition: A | Discharge status: Home / Self Care | Payer: Managed Care, Other (non HMO) | Source: Ambulatory Visit | Attending: Gastroenterology | Admitting: Gastroenterology

## 2011-10-11 DIAGNOSIS — R112 Nausea with vomiting, unspecified: Secondary | ICD-10-CM

## 2011-10-12 HISTORY — PX: KNEE ARTHROSCOPY: SUR90

## 2011-10-28 ENCOUNTER — Emergency Department (HOSPITAL_COMMUNITY): Payer: Managed Care, Other (non HMO)

## 2011-10-28 ENCOUNTER — Emergency Department (HOSPITAL_COMMUNITY)
Admission: EM | Admit: 2011-10-28 | Discharge: 2011-10-28 | Disposition: A | Discharge status: Home / Self Care | Payer: Managed Care, Other (non HMO) | Attending: Emergency Medicine | Admitting: Emergency Medicine

## 2011-10-28 DIAGNOSIS — E039 Hypothyroidism, unspecified: Secondary | ICD-10-CM | POA: Insufficient documentation

## 2011-10-28 DIAGNOSIS — F329 Major depressive disorder, single episode, unspecified: Secondary | ICD-10-CM | POA: Insufficient documentation

## 2011-10-28 DIAGNOSIS — K219 Gastro-esophageal reflux disease without esophagitis: Secondary | ICD-10-CM | POA: Insufficient documentation

## 2011-10-28 DIAGNOSIS — F3289 Other specified depressive episodes: Secondary | ICD-10-CM | POA: Insufficient documentation

## 2011-10-28 DIAGNOSIS — N390 Urinary tract infection, site not specified: Secondary | ICD-10-CM

## 2011-10-28 DIAGNOSIS — K861 Other chronic pancreatitis: Secondary | ICD-10-CM | POA: Insufficient documentation

## 2011-10-28 DIAGNOSIS — R109 Unspecified abdominal pain: Secondary | ICD-10-CM

## 2011-10-28 DIAGNOSIS — Z79899 Other long term (current) drug therapy: Secondary | ICD-10-CM | POA: Insufficient documentation

## 2011-10-28 DIAGNOSIS — R509 Fever, unspecified: Secondary | ICD-10-CM | POA: Insufficient documentation

## 2011-10-28 DIAGNOSIS — R112 Nausea with vomiting, unspecified: Secondary | ICD-10-CM | POA: Insufficient documentation

## 2011-10-28 DIAGNOSIS — R1013 Epigastric pain: Secondary | ICD-10-CM | POA: Insufficient documentation

## 2011-10-28 LAB — CBC WITH DIFFERENTIAL/PLATELET
Basophils Absolute: 0 10*3/uL (ref 0.0–0.1)
Eosinophils Relative: 3 % (ref 0–5)
HCT: 32.9 % — ABNORMAL LOW (ref 36.0–46.0)
Hemoglobin: 10.5 g/dL — ABNORMAL LOW (ref 12.0–15.0)
Lymphocytes Relative: 11 % — ABNORMAL LOW (ref 12–46)
MCV: 84.4 fL (ref 78.0–100.0)
Monocytes Absolute: 0.7 10*3/uL (ref 0.1–1.0)
Monocytes Relative: 5 % (ref 3–12)
RDW: 14.9 % (ref 11.5–15.5)
WBC: 14 10*3/uL — ABNORMAL HIGH (ref 4.0–10.5)

## 2011-10-28 LAB — URINALYSIS, ROUTINE W REFLEX MICROSCOPIC
Ketones, ur: NEGATIVE mg/dL
Nitrite: NEGATIVE
Protein, ur: NEGATIVE mg/dL
Urobilinogen, UA: 0.2 mg/dL (ref 0.0–1.0)

## 2011-10-28 LAB — LIPASE, BLOOD: Lipase: 12 U/L (ref 11–59)

## 2011-10-28 LAB — COMPREHENSIVE METABOLIC PANEL
AST: 16 U/L (ref 0–37)
BUN: 15 mg/dL (ref 6–23)
CO2: 26 mEq/L (ref 19–32)
Calcium: 8.7 mg/dL (ref 8.4–10.5)
Creatinine, Ser: 0.81 mg/dL (ref 0.50–1.10)
GFR calc Af Amer: >90 mL/min (ref >90)
GFR calc non Af Amer: 86 mL/min — ABNORMAL LOW (ref >90)
Glucose, Bld: 105 mg/dL — ABNORMAL HIGH (ref 70–99)
Total Bilirubin: 0.2 mg/dL — ABNORMAL LOW (ref 0.3–1.2)

## 2011-10-28 LAB — PREGNANCY, URINE: Preg Test, Ur: NEGATIVE

## 2011-10-28 MED ORDER — CEPHALEXIN 500 MG PO CAPS: 500.0000 mg | ORAL_CAPSULE | Freq: Four times a day (QID) | ORAL | Status: AC

## 2011-10-28 MED ORDER — ACETAMINOPHEN 325 MG PO TABS
650.0000 mg | ORAL_TABLET | Freq: Once | ORAL | Status: AC
  Administered 2011-10-28: 650 mg via ORAL
  Filled 2011-10-28: qty 2

## 2011-10-28 MED ORDER — HYDROMORPHONE HCL PF 1 MG/ML IJ SOLN
1.0000 mg | Freq: Once | INTRAMUSCULAR | Status: AC
  Administered 2011-10-28: 1 mg via INTRAVENOUS
  Filled 2011-10-28: qty 1

## 2011-10-28 MED ORDER — DEXTROSE 5 % IV SOLN
1.0000 g | Freq: Once | INTRAVENOUS | Status: AC
  Administered 2011-10-28: 1 g via INTRAVENOUS
  Filled 2011-10-28: qty 10

## 2011-10-28 MED ORDER — OXYCODONE-ACETAMINOPHEN 5-325 MG PO TABS: 1.0000 | ORAL_TABLET | Freq: Four times a day (QID) | ORAL | Status: AC | PRN

## 2011-10-28 MED ORDER — SODIUM CHLORIDE 0.9 % IV BOLUS (SEPSIS)
1000.0000 mL | Freq: Once | INTRAVENOUS | Status: AC
  Administered 2011-10-28: 1000 mL via INTRAVENOUS

## 2011-10-28 MED ORDER — ONDANSETRON HCL 4 MG PO TABS: 4.0000 mg | ORAL_TABLET | Freq: Four times a day (QID) | ORAL | Status: AC

## 2011-10-28 MED ORDER — ONDANSETRON HCL 4 MG/2ML IJ SOLN
4.0000 mg | Freq: Once | INTRAMUSCULAR | Status: AC
  Administered 2011-10-28: 4 mg via INTRAVENOUS
  Filled 2011-10-28: qty 2

## 2011-10-28 NOTE — ED Notes (Signed)
Pt states hx of pancreatitis.  Woke up this morning at 3 am c/o abdominal pain 10/10 in the center of her abdomen.  Pt states that she is "bed ridden" because of her pancreatitis.  When asked why she stated that she "takes too much "dilala"".  Pt also had knee surgery last Wednesday.

## 2011-10-28 NOTE — ED Provider Notes (Signed)
History     CSN: [id number]  Arrival date & time 10/28/11  4098   First MD Initiated Contact with Patient 10/28/11 [phone]      No chief complaint on file.   (Consider location/radiation/quality/duration/timing/severity/associated sxs/prior treatment) HPI Patient with history of pancreatitis renal stones reflux presenting with midepigastric abdominal pain as well as fever. She states that her symptoms began last night. She has had nausea and vomiting as well. Her emesis is nonbloody and nonbilious. No diarrhea. She states the pain in her midabdomen feels like involving sensation similar to her prior episodes of pancreatitis. She denies any urinary symptoms. Of note approximately 5 days ago she had arthroscopic surgery on her right knee. However this has been healing well without much pain there is no swelling or redness of the knee.  She has not taken anything for her symptoms prior to arrival.  There are no other associated systemic symptoms, there are no other alleviating or modifying factors.   Past Medical History  Diagnosis Date  . Pancreatitis   . Depression   . GERD (gastroesophageal reflux disease)   . Thyroid disease   . Allergy   . Iron deficiency   . Hypothyroidism   . Anemia   . Pancreatic pseudocyst     Past Surgical History  Procedure Date  . Cholecystectomy 10/12  . Neck surgery 07/2009  . Oophorectomy 11/2009  . Back surgery 10/2010  . Abdominal hysterectomy 10/1999  . Eus 03/01/2011    Procedure: UPPER ENDOSCOPIC ULTRASOUND (EUS) LINEAR;  Surgeon: Freddy Jaksch, MD;  Location: [facility];  Service: Endoscopy;  Laterality: N/A;  . Fine needle aspiration 03/01/2011    Procedure: FINE NEEDLE ASPIRATION (FNA) LINEAR;  Surgeon: Freddy Jaksch, MD;  Location: [facility];  Service: Endoscopy;  Laterality: N/A;    Family History  Problem Relation Age of Onset  . Cancer Father     History  Substance Use Topics  . Smoking status: Never Smoker   . Smokeless  tobacco: Not on file  . Alcohol Use: No    OB History    Grav Para Term Preterm Abortions TAB SAB Ect Mult Living                  Review of Systems ROS reviewed and all otherwise negative except for mentioned in HPI  Allergies  Zolpidem tartrate  Home Medications   Current Outpatient Rx  Name Route Sig Dispense Refill  . BUPROPION HCL ER (XL) 300 MG PO TB24 Oral Take 300 mg by mouth daily.     Marland Kitchen HYDROCOD POLST-CPM POLST ER 10-8 MG/5ML PO LQCR Oral Take 5 mLs by mouth every 12 (twelve) hours as needed. 140 mL 1  . DIAZEPAM 10 MG PO TABS Oral Take 10 mg by mouth 4 (four) times daily.    . DULOXETINE HCL 30 MG PO CPEP Oral Take 90 mg by mouth daily.    Marland Kitchen GABAPENTIN 300 MG PO CAPS Oral Take 600 mg by mouth 3 (three) times daily.     Marland Kitchen HYDROCHLOROTHIAZIDE 25 MG PO TABS Oral Take 25 mg by mouth daily with breakfast.     . HYDROMORPHONE HCL 4 MG PO TABS Oral Take 4 mg by mouth 3 (three) times daily as needed. pain    . LEVOTHYROXINE SODIUM 112 MCG PO TABS Oral Take 112 mcg by mouth daily with breakfast.    . LORATADINE 10 MG PO TABS Oral Take 10 mg by mouth daily.      Marland Kitchen  MAGNESIUM HYDROXIDE 400 MG/5ML PO SUSP Oral Take 30 mLs by mouth daily as needed. For constipation    . URIBEL 118 MG PO CAPS Oral Take 118 mg by mouth 4 (four) times daily as needed. uti    . METHOCARBAMOL 500 MG PO TABS Oral Take 500 mg by mouth 3 (three) times daily.     . ADULT MULTIVITAMIN W/MINERALS CH Oral Take 1 tablet by mouth daily.    Marland Kitchen CALCIUM POLYCARBOPHIL 625 MG PO TABS Oral Take 1,250 mg by mouth 3 (three) times daily.    Marland Kitchen PROCTOFOAM HC 1-1 % RE FOAM Rectal Place 1 applicator rectally 2 (two) times daily as needed.     Marland Kitchen PROMETHAZINE HCL 25 MG PO TABS Oral Take 25 mg by mouth every 6 (six) hours as needed. For nausea    . PSYLLIUM 0.52 G PO CAPS Oral Take 0.52 g by mouth 2 (two) times daily.    Marland Kitchen RANITIDINE HCL 150 MG PO TABS Oral Take 150 mg by mouth 2 (two) times daily.    Marland Kitchen TAMSULOSIN HCL 0.4 MG PO  CAPS Oral Take 0.4 mg by mouth daily.    . CEPHALEXIN 500 MG PO CAPS Oral Take 1 capsule (500 mg total) by mouth 4 (four) times daily. 40 capsule 0  . ONDANSETRON HCL 4 MG PO TABS Oral Take 1 tablet (4 mg total) by mouth every 6 (six) hours. 12 tablet 0  . OXYCODONE-ACETAMINOPHEN 5-325 MG PO TABS Oral Take 1-2 tablets by mouth every 6 (six) hours as needed for pain. 15 tablet 0    BP 94/59  Pulse 66  Temp 98.5 F (36.9 C) (Oral)  Resp 18  SpO2 96% Vitals reviewed Physical Exam Physical Examination: General appearance - alert, well appearing, and in no distress Mental status - alert, oriented to person, place, and time Eyes - no scleral icterus, no conjunctival injection Mouth - mucous membranes moist, pharynx normal without lesions Chest - clear to auscultation, no wheezes, rales or rhonchi, symmetric air entry Heart - normal rate, regular rhythm, normal S1, S2, no murmurs, rubs, clicks or gallops Abdomen - soft, epigastric tenderness, no gaurding or rebound, nondistended, no masses or organomegaly, nabs Extremities - peripheral pulses normal, no pedal edema, no clubbing or cyanosis Skin - normal coloration and turgor, no rashes, brisk cap refill Psych- anxious, tearful  ED Course  Procedures (including critical care time)  Labs Reviewed  CBC WITH DIFFERENTIAL - Abnormal; Notable for the following:    WBC 14.0 (*)     Hemoglobin 10.5 (*)     HCT 32.9 (*)     Neutrophils Relative 81 (*)     Neutro Abs 11.3 (*)     Lymphocytes Relative 11 (*)     All other components within normal limits  COMPREHENSIVE METABOLIC PANEL - Abnormal; Notable for the following:    Glucose, Bld 105 (*)     Albumin 3.3 (*)     Total Bilirubin 0.2 (*)     GFR calc non Af Amer 86 (*)     All other components within normal limits  URINALYSIS, ROUTINE W REFLEX MICROSCOPIC - Abnormal; Notable for the following:    APPearance CLOUDY (*)     Leukocytes, UA MODERATE (*)     All other components within  normal limits  URINE MICROSCOPIC-ADD ON - Abnormal; Notable for the following:    Bacteria, UA MANY (*)     All other components within normal limits  LIPASE, BLOOD  PREGNANCY, URINE  LAB REPORT - SCANNED   US Abdomen Complete  10/28/2011  *RADIOLOGY REPORT*  Clinical Data:  Epigastric abdominal pain. History pancreatitis.  COMPLETE ABDOMINAL ULTRASOUND  Comparison:  Upper GI of 10/11/2011.  Abdominal CT of 08/29/2011.  Findings:  Gallbladder:  Surgically absent  Common bile duct: Normal, 5 mm.  Liver: Normal in echogenicity, without focal lesion.  IVC: Negative  Pancreas:  Body and tail poorly visualized due to overlying bowel gas.  Spleen:  Normal in size and echogenicity.  Right Kidney:  10.0 cm.  Mild renal cortical thinning.  Renal calculi, as described on prior CT. No hydronephrosis.  Left Kidney:  10.9 cm.  Suspect mild renal cortical thinning. Renal calculi, better visualized on recent CT. No hydronephrosis.  Abdominal aorta:  Nonaneurysmal without ascites.  IMPRESSION:  1.  Cholecystectomy without biliary ductal dilatation. 2.  Bilateral renal calculi. 3.  Suboptimal visualization of the pancreatic tail and distal body.  Original Report Authenticated By: Consuello Bossier, M.D.     1. Urinary tract infection   2. Abdominal pain       MDM  Pt presenting with c/o epigastric pain, fever.  Found on workup in ED to have UTI.  No significant elevation in other labs/lipase specifically.  Pt treated with IV hydration and meds.  Also started on IV abx in ED, discharged to continue coarse of po abx. Discharged with strict return precautions.  Pt agreeable with plan.        Ethelda Chick, MD 10/30/11 [phone]

## 2011-11-20 ENCOUNTER — Other Ambulatory Visit: Payer: Self-pay | Admitting: Urology

## 2011-12-14 ENCOUNTER — Encounter (HOSPITAL_BASED_OUTPATIENT_CLINIC_OR_DEPARTMENT_OTHER): Payer: Self-pay | Admitting: *Deleted

## 2011-12-14 NOTE — Progress Notes (Signed)
NPO AFTER MN. ARRIVES AT 0700. NEEDS ISTAT. CURRENT EKG IN EPIC AND CHART. WILL TAKE VALIUM, NEURONTIN, ROBAXIN, SYNTHROID AND ZANTAC AM OF SURG W/ SIPS OF WATER. AND IF NEEDED DILAUDID.

## 2011-12-20 ENCOUNTER — Ambulatory Visit (HOSPITAL_BASED_OUTPATIENT_CLINIC_OR_DEPARTMENT_OTHER): Payer: Managed Care, Other (non HMO) | Admitting: Anesthesiology

## 2011-12-20 ENCOUNTER — Encounter (HOSPITAL_BASED_OUTPATIENT_CLINIC_OR_DEPARTMENT_OTHER): Payer: Self-pay

## 2011-12-20 ENCOUNTER — Encounter (HOSPITAL_BASED_OUTPATIENT_CLINIC_OR_DEPARTMENT_OTHER): Payer: Self-pay | Admitting: Anesthesiology

## 2011-12-20 ENCOUNTER — Encounter (HOSPITAL_BASED_OUTPATIENT_CLINIC_OR_DEPARTMENT_OTHER)
Admission: RE | Disposition: A | Discharge status: Home / Self Care | Payer: Self-pay | Source: Ambulatory Visit | Attending: Urology

## 2011-12-20 ENCOUNTER — Ambulatory Visit (HOSPITAL_BASED_OUTPATIENT_CLINIC_OR_DEPARTMENT_OTHER)
Admission: RE | Admit: 2011-12-20 | Discharge: 2011-12-20 | Disposition: A | Discharge status: Home / Self Care | Payer: Managed Care, Other (non HMO) | Source: Ambulatory Visit | Attending: Urology | Admitting: Urology

## 2011-12-20 DIAGNOSIS — N200 Calculus of kidney: Secondary | ICD-10-CM

## 2011-12-20 DIAGNOSIS — N390 Urinary tract infection, site not specified: Secondary | ICD-10-CM

## 2011-12-20 DIAGNOSIS — E039 Hypothyroidism, unspecified: Secondary | ICD-10-CM | POA: Insufficient documentation

## 2011-12-20 DIAGNOSIS — K219 Gastro-esophageal reflux disease without esophagitis: Secondary | ICD-10-CM | POA: Insufficient documentation

## 2011-12-20 DIAGNOSIS — Z79899 Other long term (current) drug therapy: Secondary | ICD-10-CM | POA: Insufficient documentation

## 2011-12-20 HISTORY — DX: Calculus of ureter: N20.1

## 2011-12-20 HISTORY — DX: Unspecified abdominal pain: R10.9

## 2011-12-20 HISTORY — PX: CYSTOSCOPY WITH URETEROSCOPY: SHX5123

## 2011-12-20 HISTORY — DX: Other cervical disc degeneration, unspecified cervical region: M50.30

## 2011-12-20 HISTORY — DX: Cervicalgia: M54.2

## 2011-12-20 HISTORY — DX: Personal history of urinary calculi: Z87.442

## 2011-12-20 HISTORY — DX: Pain in right knee: M25.561

## 2011-12-20 HISTORY — DX: Effusion, right knee: M25.461

## 2011-12-20 HISTORY — DX: Nausea with vomiting, unspecified: R11.2

## 2011-12-20 HISTORY — DX: Other specified postprocedural states: Z98.890

## 2011-12-20 HISTORY — DX: Nausea: R11.0

## 2011-12-20 HISTORY — DX: Irritable bowel syndrome, unspecified: K58.9

## 2011-12-20 HISTORY — DX: Personal history of other diseases of the digestive system: Z87.19

## 2011-12-20 LAB — POCT I-STAT 4, (NA,K, GLUC, HGB,HCT)
Glucose, Bld: 96 mg/dL (ref 70–99)
HCT: 39 % (ref 36.0–46.0)
Potassium: 3.3 mEq/L — ABNORMAL LOW (ref 3.5–5.1)

## 2011-12-20 SURGERY — CYSTOSCOPY WITH URETEROSCOPY
Anesthesia: General | Site: Ureter | Laterality: Right | Wound class: Clean Contaminated

## 2011-12-20 MED ORDER — OXYBUTYNIN CHLORIDE 5 MG PO TABS
5.0000 mg | ORAL_TABLET | Freq: Three times a day (TID) | ORAL | Status: DC
  Administered 2011-12-20: 5 mg via ORAL

## 2011-12-20 MED ORDER — GENTAMICIN SULFATE 40 MG/ML IJ SOLN
120.0000 mg | INTRAVENOUS | Status: AC
  Administered 2011-12-20: 120 mg via INTRAVENOUS

## 2011-12-20 MED ORDER — FENTANYL CITRATE 0.05 MG/ML IJ SOLN
25.0000 ug | INTRAMUSCULAR | Status: DC | PRN
  Administered 2011-12-20: 25 ug via INTRAVENOUS

## 2011-12-20 MED ORDER — DEXAMETHASONE SODIUM PHOSPHATE 4 MG/ML IJ SOLN
INTRAMUSCULAR | Status: DC | PRN
  Administered 2011-12-20: 10 mg via INTRAVENOUS

## 2011-12-20 MED ORDER — IOHEXOL 350 MG/ML SOLN
INTRAVENOUS | Status: DC | PRN
  Administered 2011-12-20: 6 mL

## 2011-12-20 MED ORDER — KETOROLAC TROMETHAMINE 30 MG/ML IJ SOLN
15.0000 mg | Freq: Once | INTRAMUSCULAR | Status: AC | PRN
  Administered 2011-12-20: 15 mg via INTRAVENOUS

## 2011-12-20 MED ORDER — LEVOFLOXACIN 500 MG PO TABS: 500.0000 mg | ORAL_TABLET | Freq: Every day | ORAL | Status: DC

## 2011-12-20 MED ORDER — LACTATED RINGERS IV SOLN
INTRAVENOUS | Status: DC
  Administered 2011-12-20 (×2): via INTRAVENOUS
  Administered 2011-12-20: 100 mL/h via INTRAVENOUS

## 2011-12-20 MED ORDER — BELLADONNA ALKALOIDS-OPIUM 16.2-60 MG RE SUPP
RECTAL | Status: DC | PRN
  Administered 2011-12-20: 1 via RECTAL

## 2011-12-20 MED ORDER — SODIUM CHLORIDE 0.9 % IR SOLN
Status: DC | PRN
  Administered 2011-12-20: 6000 mL

## 2011-12-20 MED ORDER — HYDROMORPHONE HCL 2 MG PO TABS
2.0000 mg | ORAL_TABLET | Freq: Four times a day (QID) | ORAL | Status: DC | PRN
  Administered 2011-12-20: 2 mg via ORAL

## 2011-12-20 MED ORDER — FENTANYL CITRATE 0.05 MG/ML IJ SOLN
INTRAMUSCULAR | Status: DC | PRN
  Administered 2011-12-20 (×2): 25 ug via INTRAVENOUS
  Administered 2011-12-20: 100 ug via INTRAVENOUS

## 2011-12-20 MED ORDER — LIDOCAINE HCL 2 % EX GEL
CUTANEOUS | Status: DC | PRN
  Administered 2011-12-20: 1 via URETHRAL

## 2011-12-20 MED ORDER — LIDOCAINE HCL (CARDIAC) 20 MG/ML IV SOLN
INTRAVENOUS | Status: DC | PRN
  Administered 2011-12-20: 75 mg via INTRAVENOUS

## 2011-12-20 MED ORDER — PROPOFOL 10 MG/ML IV BOLUS
INTRAVENOUS | Status: DC | PRN
  Administered 2011-12-20: 300 mg via INTRAVENOUS

## 2011-12-20 MED ORDER — PROMETHAZINE HCL 25 MG/ML IJ SOLN: 6.2500 mg | INTRAMUSCULAR | Status: DC | PRN

## 2011-12-20 MED ORDER — PHENAZOPYRIDINE HCL 100 MG PO TABS
100.0000 mg | ORAL_TABLET | Freq: Three times a day (TID) | ORAL | Status: DC
  Administered 2011-12-20: 100 mg via ORAL

## 2011-12-20 MED ORDER — PHENAZOPYRIDINE HCL 100 MG PO TABS: 200.0000 mg | ORAL_TABLET | Freq: Three times a day (TID) | ORAL | Status: DC | PRN

## 2011-12-20 MED ORDER — HYDROMORPHONE HCL 2 MG PO TABS: 2.0000 mg | ORAL_TABLET | ORAL | Status: DC | PRN

## 2011-12-20 MED ORDER — ONDANSETRON HCL 4 MG/2ML IJ SOLN
INTRAMUSCULAR | Status: DC | PRN
  Administered 2011-12-20: 4 mg via INTRAVENOUS

## 2011-12-20 MED ORDER — SENNOSIDES-DOCUSATE SODIUM 8.6-50 MG PO TABS: 1.0000 | ORAL_TABLET | Freq: Two times a day (BID) | ORAL | Status: DC

## 2011-12-20 MED ORDER — SODIUM CHLORIDE 0.9 % IV SOLN
2.0000 g | INTRAVENOUS | Status: AC
  Administered 2011-12-20: 2 g via INTRAVENOUS

## 2011-12-20 MED ORDER — OXYBUTYNIN CHLORIDE 5 MG PO TABS
5.0000 mg | ORAL_TABLET | Freq: Four times a day (QID) | ORAL | Status: DC | PRN
Start: 2011-12-20 — End: 2012-03-07

## 2011-12-20 SURGICAL SUPPLY — 45 items
ADAPTER CATH URET PLST 4-6FR (CATHETERS) ×1 IMPLANT
ADPR CATH URET STRL DISP 4-6FR (CATHETERS) ×2
BAG DRAIN URO-CYSTO SKYTR STRL (DRAIN) ×3 IMPLANT
BAG DRN UROCATH (DRAIN) ×2
BASKET LASER NITINOL 1.9FR (BASKET) IMPLANT
BASKET STNLS GEMINI 4WIRE 3FR (BASKET) IMPLANT
BASKET ZERO TIP NITINOL 2.4FR (BASKET) ×2 IMPLANT
BRUSH URET BIOPSY 3F (UROLOGICAL SUPPLIES) IMPLANT
BSKT STON RTRVL 120 1.9FR (BASKET)
BSKT STON RTRVL GEM 120X11 3FR (BASKET)
BSKT STON RTRVL ZERO TP 2.4FR (BASKET) ×2
CANISTER SUCT LVC 12 LTR MEDI- (MISCELLANEOUS) ×1 IMPLANT
CATH INTERMIT  6FR 70CM (CATHETERS) ×2 IMPLANT
CATH URET 5FR 28IN CONE TIP (BALLOONS)
CATH URET 5FR 28IN OPEN ENDED (CATHETERS) IMPLANT
CATH URET 5FR 70CM CONE TIP (BALLOONS) IMPLANT
CLOTH BEACON ORANGE TIMEOUT ST (SAFETY) ×3 IMPLANT
DRAPE CAMERA CLOSED 9X96 (DRAPES) ×3 IMPLANT
ELECT REM PT RETURN 9FT ADLT (ELECTROSURGICAL)
ELECTRODE REM PT RTRN 9FT ADLT (ELECTROSURGICAL) IMPLANT
GLOVE BIOGEL PI IND STRL 6.5 (GLOVE) ×1 IMPLANT
GLOVE BIOGEL PI IND STRL 7.0 (GLOVE) IMPLANT
GLOVE BIOGEL PI INDICATOR 6.5 (GLOVE) ×1
GLOVE BIOGEL PI INDICATOR 7.0 (GLOVE) ×1
GLOVE ECLIPSE 7.0 STRL STRAW (GLOVE) ×3 IMPLANT
GLOVE INDICATOR 7.5 STRL GRN (GLOVE) ×3 IMPLANT
GOWN PREVENTION PLUS LG XLONG (DISPOSABLE) ×3 IMPLANT
GOWN STRL REIN XL XLG (GOWN DISPOSABLE) ×3 IMPLANT
GUIDEWIRE 0.038 PTFE COATED (WIRE) IMPLANT
GUIDEWIRE ANG ZIPWIRE 038X150 (WIRE) IMPLANT
GUIDEWIRE STR DUAL SENSOR (WIRE) ×4 IMPLANT
IV NS IRRIG 3000ML ARTHROMATIC (IV SOLUTION) ×5 IMPLANT
KIT BALLIN UROMAX 15FX10 (LABEL) IMPLANT
KIT BALLN UROMAX 15FX4 (MISCELLANEOUS) IMPLANT
KIT BALLN UROMAX 26 75X4 (MISCELLANEOUS)
LASER FIBER DISP (UROLOGICAL SUPPLIES) ×2 IMPLANT
NS IRRIG 500ML POUR BTL (IV SOLUTION) ×1 IMPLANT
PACK CYSTOSCOPY (CUSTOM PROCEDURE TRAY) ×3 IMPLANT
SET HIGH PRES BAL DIL (LABEL)
SHEATH ACCESS URETERAL 38CM (SHEATH) ×2 IMPLANT
SHEATH ACCESS URETERAL 54CM (SHEATH) IMPLANT
SHEATH URET ACCESS 12FR/35CM (UROLOGICAL SUPPLIES) IMPLANT
SHEATH URET ACCESS 12FR/55CM (UROLOGICAL SUPPLIES) IMPLANT
STENT URET 6FRX24 CONTOUR (STENTS) ×1 IMPLANT
SYRINGE IRR TOOMEY STRL 70CC (SYRINGE) IMPLANT

## 2011-12-20 NOTE — Anesthesia Preprocedure Evaluation (Signed)
Anesthesia Evaluation  Patient identified by MRN, date of birth, ID band Patient awake    Reviewed: Allergy & Precautions, H&P , NPO status , Patient's Chart, lab work & pertinent test results  Airway Mallampati: III TM Distance: <3 FB Neck ROM: Full    Dental No notable dental hx.    Pulmonary neg pulmonary ROS,  breath sounds clear to auscultation  Pulmonary exam normal       Cardiovascular negative cardio ROS  Rhythm:Regular Rate:Normal     Neuro/Psych negative neurological ROS  negative psych ROS   GI/Hepatic Neg liver ROS, GERD-  Medicated,  Endo/Other  negative endocrine ROSHypothyroidism Morbid obesity  Renal/GU negative Renal ROS  negative genitourinary   Musculoskeletal negative musculoskeletal ROS (+)   Abdominal   Peds negative pediatric ROS (+)  Hematology negative hematology ROS (+)   Anesthesia Other Findings   Reproductive/Obstetrics negative OB ROS                           Anesthesia Physical Anesthesia Plan  ASA: II  Anesthesia Plan: General   Post-op Pain Management:    Induction: Intravenous  Airway Management Planned: LMA  Additional Equipment:   Intra-op Plan:   Post-operative Plan:   Informed Consent: I have reviewed the patients History and Physical, chart, labs and discussed the procedure including the risks, benefits and alternatives for the proposed anesthesia with the patient or authorized representative who has indicated his/her understanding and acceptance.   Dental advisory given  Plan Discussed with: CRNA and Surgeon  Anesthesia Plan Comments:         Anesthesia Quick Evaluation

## 2011-12-20 NOTE — Op Note (Signed)
[name], VIEN NO.:  [id number]  MEDICAL RECORD NO.:  [id number]  LOCATION:  PERIOP                        FACILITY:  [facility]  PHYSICIAN:  Natalia Leatherwood, MD    DATE OF BIRTH:  1965/06/19  DATE OF PROCEDURE:  12/20/2011 DATE OF DISCHARGE:                              OPERATIVE REPORT   SURGEON:  Natalia Leatherwood, MD  ASSISTANT:  None.  PREOPERATIVE DIAGNOSIS:  Recurrent urinary tract infection with nephrolithiasis on the right side.  POSTOPERATIVE DIAGNOSIS:  Recurrent urinary tract infection with nephrolithiasis on the right side.  PROCEDURE PERFORMED: 1. Cystoscopy. 2. Right Ureteroscopy. 3. Laser lithotripsy. 4. Basket stone retrieval. 5. Fluoroscopy with interpretation. 6. Right ureter stent placement.  FINDINGS:  Three stones in the right kidney, all of which remained attached to the epithelium and were immobile at the time of ureteroscopy.  No obstruction.  No injury or damage to the ureter.  COMPLICATIONS:  None.  DRAINS:  None.  SPECIMEN:  Stones removed from the right kidney and sent to [facility] for chemical analysis.  COMPLICATIONS:  None.  ESTIMATED BLOOD LOSS:  None.  HISTORY OF PRESENT ILLNESS:  This is a 46 year old female who has bilateral nephrolithiasis and recurrent urinary tract infections.  None of these stones were obstructing and they were small in nature.  Given the recurrent urinary tract infection, she had no other source for possible nidus of infection and therefore we discussed the possibility of stone removal to see if this would rid her of her infections.  Of note, the patient has chronic abdominal and back pain of an unclear etiology.  Her pain is not consistent with stone pain, however, she does have stones present.  She presented today for right ureteroscopy.  PROCEDURE:  After informed consent was obtained, the patient was taken to operating room.  She was placed in a supine position.  SCDs  were turned on and placed prior to the induction of anesthesia.  IV antibiotics were infused and general anesthesia was induced.  She was then placed in a dorsal lithotomy position making sure to pad all pertinent neurovascular pressure points appropriately.  Her genitals were prepped and draped in usual sterile fashion.  A time-out was then performed in which the correct patient, surgical site, and procedure were identified and agreed upon by the team.  Rigid cystoscope was advanced through the urethra into the bladder.  The bladder was fully distended and evaluated in a systematic fashion to visualize the entirety of the internal portion of the bladder.  There were no lesions noted in the bladder.  Attention was turned to the right ureteral orifice.  It was cannulated with 2 sensor tip wires.  These were placed up into the right renal pelvis on fluoroscopy with ease. The scope was removed and the bladder was drained.  One wire was secured to the drape as a safety wire, the other was used as a working wire. First I attempted to place the digital ureteral flexible scope up the working wire, but this was unsuccessful.  Therefore, I used a 12-14 ureteral access sheath and placed this just into the distal ureter with the obturator first and then the  obturator and sheath.  Once the sheath was in place, I removed the obturator and the wire and was able to navigate the ureteroscope up the entirety of the ureter.  There were no obstructing stones noted in the ureter.  Once in the kidney, I was able to evaluate the kidney in a systematic fashion to view all calices. There were 3 stones noted up in the kidney as noted on the CT scan.  All of these stones remained attached to the epithelium and were not mobile. It is therefore my conclusion that these stones are not the source of the patient's pain.  I did note multiple Randall's plaques that were in the process of forming stones.  A 0 tip Nitinol  basket was used to remove 2 smaller stones and then the larger stone in the upper pole.  I had to perform lithotripsy in order to remove it.  I used a 200 micron holmium laser filament and perform lithotripsy at 0.5 joules and 20 Hz. This was broken into smaller pieces and all the pieces were grasped with 0 tip Nitinol basket and removed.  After this, I again inspected the ureter and there was no injury to the ureter.  I removed the sheath and the ureteroscope and reviewed the distal ureter in which there was no injury where the sheath was located.  After this, the safety wire was threaded through the rigid cystoscope and a 6 x 24 double-J ureteral stent was placed over the wire with a good curl noted in the right renal pelvis on fluoroscopy and a good curl on direct visualization.  The safety strings were left in place as the patient will be able to remove this in several days.  A belladonna and opium suppository was placed into her rectum prior to the procedure and lidocaine jelly was placed into her urethra after the procedure.  The strings were secured to her body.  Anesthesia was reversed.  She was placed back in supine position and she was taken to PACU in stable condition.  The patient will remove her stent this coming Friday early in the morning.  She will be covered with antibiotics and pain medication.  She and her husband were advised that her stones were not the source of her pain.  We will re-evaluate her as scheduled and follow up to see how she would like to manage her left side.          ______________________________ Natalia Leatherwood, MD     DW/MEDQ  D:  12/20/2011  T:  12/20/2011  Job:  [id number]

## 2011-12-20 NOTE — Transfer of Care (Signed)
Immediate Anesthesia Transfer of Care Note  Patient: [name] [facility]  Procedure(s) Performed: Procedure(s) (LRB): CYSTOSCOPY WITH URETEROSCOPY (Right) HOLMIUM LASER APPLICATION (Right) CYSTOSCOPY WITH STENT PLACEMENT (Right)  Patient Location: Patient transported to PACU with oxygen via face mask at 4 Liters / Min  Anesthesia Type: General  Level of Consciousness: awake and alert   Airway & Oxygen Therapy: Patient Spontanous Breathing and Patient connected to face mask oxygen  Post-op Assessment: Report given to PACU RN and Post -op Vital signs reviewed and stable  Post vital signs: Reviewed and stable  Dentition: Teeth and oropharynx remain in pre-op condition  Complications: No apparent anesthesia complications

## 2011-12-20 NOTE — Anesthesia Procedure Notes (Signed)
Procedure Name: LMA Insertion Date/Time: 12/20/2011 8:31 AM Performed by: Fran Lowes Pre-anesthesia Checklist: Patient identified, Emergency Drugs available, Suction available and Patient being monitored Patient Re-evaluated:Patient Re-evaluated prior to inductionOxygen Delivery Method: Circle System Utilized Preoxygenation: Pre-oxygenation with 100% oxygen Intubation Type: IV induction Ventilation: Mask ventilation without difficulty LMA: LMA inserted LMA Size: 4.0 Number of attempts: 1 Airway Equipment and Method: bite block Placement Confirmation: positive ETCO2 Tube secured with: Tape Dental Injury: Teeth and Oropharynx as per pre-operative assessment

## 2011-12-20 NOTE — H&P (Signed)
Urology History and Physical Exam  CC: Nephrolithiasis  HPI: 46 year old female with nephrolithiasis presents for cystoscopy, laser lithotripsy, right ureteroscopy, possible right ureter stent placement. This has been present for many years. Stone burden on CT scan in May 2013 revealed right upper pole 7.5 mm, right middle pole 4.1 mm, 5 mm, 3.4 mm. It also showed left-sided stones with a left lower pole 5.2 mm, and 2.5 mm stones. She's been treated with hydrochlorothiazide and potassium citrate. She has urinary tract infections. These tend to be recurrent. She has no other risk factors for having recurrent urinary tract infections. Today we discussed that these could be associated with her stones and I would recommend attempt at removal. I explained that removal of stones would not mitigate her chronic back and abdominal pain. We have discussed risks, benefits, alternatives, and likelihood of achieving goals. Urine culture from 12/06/11 was positive for enterococcus, and she has been treated for this with ampicillin. It is also sensitive to Levaquin and vancomycin.     PMH: Past Medical History  Diagnosis Date  . Depression   . GERD (gastroesophageal reflux disease)   . Hypothyroidism   . Pancreatic pseudocyst   . History of kidney stones   . IBS (irritable bowel syndrome)   . PONV (postoperative nausea and vomiting)   . History of pancreatitis 01-04-2011  . DDD (degenerative disc disease), cervical   . Right ureteral stone   . Muscle spasms of neck   . Cervical pain (neck)   . Right flank pain   . Nausea DUE TO PAIN FROM KIDNEY STONE  . Right knee pain   . Swelling of right knee joint     PSH: Past Surgical History  Procedure Date  . Eus 03/01/2011    Procedure: UPPER ENDOSCOPIC ULTRASOUND (EUS) LINEAR;  Surgeon: Freddy Jaksch, MD;  Location: [facility];  Service: Endoscopy;  Laterality: N/A;  . Fine needle aspiration 03/01/2011    Procedure: FINE NEEDLE ASPIRATION (FNA)  LINEAR;  Surgeon: Freddy Jaksch, MD;  Location: [facility];  Service: Endoscopy;  Laterality: N/A;  . Laparoscopic cholecystectomy 01-09-2011  . Anterior lumbar fusion 11-09-2010     L5 - S1  . Robotic-assisted right salpingo-oophorectomy w/ lysis adhesions 12-21-2009    HX ENDOMETRIOSIS AND PELVIC PAIN  . Cysto/ right retrograde pyelogram/ right ureteroscopy and stent placement 09-24-2009    HX RIGHT URETERAL CALCULUS  . Diagnostic laparoscopy 1992    ENDOMETRIOSIS  . Laparoscopic assisted vaginal hysterectomy 1997  . Ureteroscopic stone extractions     X3  . Cervical fusion 1999    C4 - 7  . Cervical spine surgery APRIL 2011    REMOVAL SCAR TISSUE  . Tonsillectomy AS CHILD  . Hernia repair AS CHILD  . Knee arthroscopy AUG 2013    RIGHT KNEE    Allergies: Allergies  Allergen Reactions  . Zolpidem Tartrate Other (See Comments)    hallucinations    Medications: Prescriptions prior to admission  Medication Sig Dispense Refill  . buPROPion (WELLBUTRIN XL) 300 MG 24 hr tablet Take 300 mg by mouth daily.       . diazepam (VALIUM) 10 MG tablet Take 10 mg by mouth 4 (four) times daily.      . DULoxetine (CYMBALTA) 30 MG capsule Take 90 mg by mouth daily.      Marland Kitchen gabapentin (NEURONTIN) 300 MG capsule Take 600 mg by mouth 3 (three) times daily.       . hydrochlorothiazide (HYDRODIURIL)  25 MG tablet Take 25 mg by mouth daily with breakfast.       . HYDROmorphone (DILAUDID) 4 MG tablet Take 4 mg by mouth 3 (three) times daily as needed. pain      . levothyroxine (SYNTHROID, LEVOTHROID) 112 MCG tablet Take 112 mcg by mouth daily with breakfast.      . loratadine (CLARITIN) 10 MG tablet Take 10 mg by mouth daily.       . magnesium hydroxide (MILK OF MAGNESIA) 400 MG/5ML suspension Take 30 mLs by mouth daily as needed. For constipation      . Meth-Hyo-M Bl-Na Phos-Ph Sal (URIBEL) 118 MG CAPS Take 118 mg by mouth 4 (four) times daily as needed. uti      . methocarbamol (ROBAXIN) 500  MG tablet Take 500 mg by mouth 3 (three) times daily.       . Multiple Vitamin (MULTIVITAMIN WITH MINERALS) TABS Take 1 tablet by mouth daily.      . polycarbophil (FIBERCON) 625 MG tablet Take 1,250 mg by mouth 3 (three) times daily.      Marland Kitchen PROCTOFOAM HC rectal foam Place 1 applicator rectally 2 (two) times daily as needed.       . psyllium (REGULOID) 0.52 G capsule Take 0.52 g by mouth 2 (two) times daily.      . ranitidine (ZANTAC) 150 MG tablet Take 150 mg by mouth 2 (two) times daily.      . Tamsulosin HCl (FLOMAX) 0.4 MG CAPS Take 0.4 mg by mouth daily.         Social History: History   Social History  . Marital Status: Married    Spouse Name: N/A    Number of Children: N/A  . Years of Education: N/A   Occupational History  . Not on file.   Social History Main Topics  . Smoking status: Never Smoker   . Smokeless tobacco: Never Used  . Alcohol Use: No  . Drug Use: No  . Sexually Active: Not on file   Other Topics Concern  . Not on file   Social History Narrative  . No narrative on file    Family History: Family History  Problem Relation Age of Onset  . Cancer Father     Review of Systems: Positive: Nausea, abdominal pain, back pain. All at baseline. Negative: Fever, SOB, chest pain.  A further 10 point review of systems was negative except what is listed in the HPI.  Physical Exam: Filed Vitals:   12/20/11 0722  BP: 119/80  Pulse: 77  Temp: 97.9 F (36.6 C)  Resp: 18    General: No acute distress.  Awake. Head:  Normocephalic.  Atraumatic. ENT:  EOMI.  Mucous membranes moist Neck:  Supple.  No lymphadenopathy. Pulmonary: Equal effort bilaterally.  Clear to auscultation bilaterally. Abdomen: Soft.  Tender to palpation diffusely (baseline). Skin:  Normal turgor.  No visible rash. Extremity: No gross deformity of bilateral upper extremities.  No gross deformity of    bilateral lower extremities. Neurologic: Alert. Appropriate  mood.    Studies:  No results found for this basename: HGB:2,WBC:2,PLT:2 in the last 72 hours  No results found for this basename: NA:2,K:2,CL:2,CO2:2,BUN:2,CREATININE:2,CALCIUM:2,MAGNESIUM:2,GFRNONAA:2,GFRAA:2 in the last 72 hours   No results found for this basename: PT:2,INR:2,APTT:2 in the last 72 hours   No components found with this basename: ABG:2    Assessment:  Right nephrolithiasis  Plan: To OR for cystoscopy, laser lithotripsy, right ureteroscopy, possible right ureter stent placement.  Ampicillin and  gentamicin on call to the OR today for antibiotics.

## 2011-12-20 NOTE — Anesthesia Postprocedure Evaluation (Signed)
  Anesthesia Post-op Note  Patient: [name] [facility]  Procedure(s) Performed: Procedure(s) (LRB): CYSTOSCOPY WITH URETEROSCOPY (Right) HOLMIUM LASER APPLICATION (Right) CYSTOSCOPY WITH STENT PLACEMENT (Right)  Patient Location: PACU  Anesthesia Type: General  Level of Consciousness: awake and alert   Airway and Oxygen Therapy: Patient Spontanous Breathing  Post-op Pain: mild  Post-op Assessment: Post-op Vital signs reviewed, Patient's Cardiovascular Status Stable, Respiratory Function Stable, Patent Airway and No signs of Nausea or vomiting  Post-op Vital Signs: stable  Complications: No apparent anesthesia complications

## 2011-12-20 NOTE — Brief Op Note (Signed)
12/20/2011  9:33 AM  PATIENT:  [name]  46 y.o. female  PRE-OPERATIVE DIAGNOSIS:  RIGHT NEPHROLITHIASIS  POST-OPERATIVE DIAGNOSIS:  RIGHT NEPHROLITHIASIS  PROCEDURE:  Procedure(s) (LRB) with comments: CYSTOSCOPY WITH URETEROSCOPY (Right) HOLMIUM LASER APPLICATION (Right) CYSTOSCOPY WITH STENT PLACEMENT (Right)  SURGEON:  Surgeon(s) and Role:    * Milford Cage, MD - Primary  PHYSICIAN ASSISTANT:   ASSISTANTS: none   ANESTHESIA:   general  EBL:  Total I/O In: 800 [I.V.:800] Out: -   BLOOD ADMINISTERED:none  DRAINS: none   LOCAL MEDICATIONS USED:  LIDOCAINE , Amount: 10 ml and OTHER B&O suppository.  SPECIMEN:  Source of Specimen:  right kidney stones  DISPOSITION OF SPECIMEN:  AUS lab  COUNTS:  YES  TOURNIQUET:  * No tourniquets in log *  DICTATION: .Other Dictation: Dictation Number [id number]  PLAN OF CARE: Discharge to home after PACU  PATIENT DISPOSITION:  PACU - hemodynamically stable.   Delay start of Pharmacological VTE agent (>24hrs) due to surgical blood loss or risk of bleeding: no

## 2011-12-21 ENCOUNTER — Encounter (HOSPITAL_BASED_OUTPATIENT_CLINIC_OR_DEPARTMENT_OTHER): Payer: Self-pay | Admitting: Urology

## 2011-12-25 ENCOUNTER — Encounter (HOSPITAL_BASED_OUTPATIENT_CLINIC_OR_DEPARTMENT_OTHER): Payer: Self-pay

## 2012-03-07 ENCOUNTER — Emergency Department (HOSPITAL_COMMUNITY)
Admission: EM | Admit: 2012-03-07 | Discharge: 2012-03-07 | Disposition: A | Discharge status: Home / Self Care | Payer: Managed Care, Other (non HMO) | Attending: Emergency Medicine | Admitting: Emergency Medicine

## 2012-03-07 ENCOUNTER — Encounter (HOSPITAL_COMMUNITY): Payer: Self-pay | Admitting: Emergency Medicine

## 2012-03-07 DIAGNOSIS — R197 Diarrhea, unspecified: Secondary | ICD-10-CM | POA: Insufficient documentation

## 2012-03-07 DIAGNOSIS — IMO0002 Reserved for concepts with insufficient information to code with codable children: Secondary | ICD-10-CM | POA: Insufficient documentation

## 2012-03-07 DIAGNOSIS — Z9089 Acquired absence of other organs: Secondary | ICD-10-CM | POA: Insufficient documentation

## 2012-03-07 DIAGNOSIS — R109 Unspecified abdominal pain: Secondary | ICD-10-CM

## 2012-03-07 DIAGNOSIS — N390 Urinary tract infection, site not specified: Secondary | ICD-10-CM

## 2012-03-07 DIAGNOSIS — G8929 Other chronic pain: Secondary | ICD-10-CM | POA: Insufficient documentation

## 2012-03-07 DIAGNOSIS — Z8739 Personal history of other diseases of the musculoskeletal system and connective tissue: Secondary | ICD-10-CM | POA: Insufficient documentation

## 2012-03-07 DIAGNOSIS — F329 Major depressive disorder, single episode, unspecified: Secondary | ICD-10-CM | POA: Insufficient documentation

## 2012-03-07 DIAGNOSIS — R350 Frequency of micturition: Secondary | ICD-10-CM | POA: Insufficient documentation

## 2012-03-07 DIAGNOSIS — K859 Acute pancreatitis without necrosis or infection, unspecified: Secondary | ICD-10-CM | POA: Insufficient documentation

## 2012-03-07 DIAGNOSIS — Z79899 Other long term (current) drug therapy: Secondary | ICD-10-CM | POA: Insufficient documentation

## 2012-03-07 DIAGNOSIS — E039 Hypothyroidism, unspecified: Secondary | ICD-10-CM | POA: Insufficient documentation

## 2012-03-07 DIAGNOSIS — Z87442 Personal history of urinary calculi: Secondary | ICD-10-CM | POA: Insufficient documentation

## 2012-03-07 DIAGNOSIS — Z8719 Personal history of other diseases of the digestive system: Secondary | ICD-10-CM | POA: Insufficient documentation

## 2012-03-07 DIAGNOSIS — R1013 Epigastric pain: Secondary | ICD-10-CM | POA: Insufficient documentation

## 2012-03-07 DIAGNOSIS — R112 Nausea with vomiting, unspecified: Secondary | ICD-10-CM | POA: Insufficient documentation

## 2012-03-07 DIAGNOSIS — K219 Gastro-esophageal reflux disease without esophagitis: Secondary | ICD-10-CM | POA: Insufficient documentation

## 2012-03-07 DIAGNOSIS — F3289 Other specified depressive episodes: Secondary | ICD-10-CM | POA: Insufficient documentation

## 2012-03-07 LAB — URINALYSIS, ROUTINE W REFLEX MICROSCOPIC
Glucose, UA: NEGATIVE mg/dL
Hgb urine dipstick: NEGATIVE
pH: 7.5 (ref 5.0–8.0)

## 2012-03-07 LAB — CBC
MCV: 80.5 fL (ref 78.0–100.0)
Platelets: 382 10*3/uL (ref 150–400)
RBC: 5.07 MIL/uL (ref 3.87–5.11)
RDW: 14.4 % (ref 11.5–15.5)
WBC: 7.9 10*3/uL (ref 4.0–10.5)

## 2012-03-07 LAB — COMPREHENSIVE METABOLIC PANEL
ALT: 15 U/L (ref 0–35)
AST: 15 U/L (ref 0–37)
Albumin: 4.3 g/dL (ref 3.5–5.2)
CO2: 24 mEq/L (ref 19–32)
Calcium: 10 mg/dL (ref 8.4–10.5)
Chloride: 99 mEq/L (ref 96–112)
Creatinine, Ser: 0.95 mg/dL (ref 0.50–1.10)
GFR calc non Af Amer: 71 mL/min — ABNORMAL LOW (ref >90)
Sodium: 139 mEq/L (ref 135–145)
Total Bilirubin: 0.4 mg/dL (ref 0.3–1.2)

## 2012-03-07 MED ORDER — MORPHINE SULFATE 4 MG/ML IJ SOLN: 6.0000 mg | Freq: Once | INTRAMUSCULAR | Status: DC

## 2012-03-07 MED ORDER — SODIUM CHLORIDE 0.9 % IV SOLN
1000.0000 mL | INTRAVENOUS | Status: DC
  Administered 2012-03-07: 1000 mL via INTRAVENOUS

## 2012-03-07 MED ORDER — ONDANSETRON 8 MG PO TBDP: 8.0000 mg | ORAL_TABLET | Freq: Three times a day (TID) | ORAL | Status: DC | PRN

## 2012-03-07 MED ORDER — ONDANSETRON HCL 4 MG/2ML IJ SOLN
4.0000 mg | Freq: Once | INTRAMUSCULAR | Status: AC
  Administered 2012-03-07: 4 mg via INTRAVENOUS
  Filled 2012-03-07: qty 2

## 2012-03-07 MED ORDER — DEXTROSE 5 % IV SOLN
1.0000 g | Freq: Once | INTRAVENOUS | Status: AC
  Administered 2012-03-07: 1 g via INTRAVENOUS
  Filled 2012-03-07: qty 10

## 2012-03-07 MED ORDER — SODIUM CHLORIDE 0.9 % IV SOLN
1000.0000 mL | Freq: Once | INTRAVENOUS | Status: AC
  Administered 2012-03-07: 1000 mL via INTRAVENOUS

## 2012-03-07 MED ORDER — HYDROMORPHONE HCL PF 1 MG/ML IJ SOLN
1.0000 mg | INTRAMUSCULAR | Status: DC | PRN
  Administered 2012-03-07 (×2): 1 mg via INTRAVENOUS
  Filled 2012-03-07 (×2): qty 1

## 2012-03-07 MED ORDER — CEPHALEXIN 500 MG PO CAPS: 500.0000 mg | ORAL_CAPSULE | Freq: Four times a day (QID) | ORAL | Status: DC

## 2012-03-07 NOTE — ED Provider Notes (Signed)
History     CSN: [id number]  Arrival date & time 03/07/12  1013   First MD Initiated Contact with Patient 03/07/12 1044      Chief Complaint  Patient presents with  . Abdominal Pain     The history is provided by the patient and medical records.   patient reports long-standing history of chronic pancreatitis.  She reports nausea vomiting and diarrhea over the past 3-4 days with increasing epigastric discomfort.  She reports her pancreatitis initially began with gallstone pancreatitis.  She is status post cholecystectomy.  She has chronic pain and is on 2 mg of Dilaudid at home for pain but she reports this is not helping with her symptoms.  No melena or hematochezia.  No hematemesis.  No episodes of passing out.  No chest pain shortness of breath or recent cough. She does have urinary frequency without dysuria.  No flank pain.  Her symptoms are moderate to severe in severity.  Nothing worsens or improves her symptoms.  Her pain is not improved by her chronic pain medicine.  She does have a history of pancreatic pseudocyst as well.  She is followed closely by her gastroenterologist Dr. Dulce Sellar Past Medical History  Diagnosis Date  . Depression   . GERD (gastroesophageal reflux disease)   . Hypothyroidism   . Pancreatic pseudocyst   . History of kidney stones   . IBS (irritable bowel syndrome)   . PONV (postoperative nausea and vomiting)   . History of pancreatitis 01-04-2011  . DDD (degenerative disc disease), cervical   . Right ureteral stone   . Muscle spasms of neck   . Cervical pain (neck)   . Right flank pain   . Nausea DUE TO PAIN FROM KIDNEY STONE  . Right knee pain   . Swelling of right knee joint     Past Surgical History  Procedure Date  . Eus 03/01/2011    Procedure: UPPER ENDOSCOPIC ULTRASOUND (EUS) LINEAR;  Surgeon: Freddy Jaksch, MD;  Location: [facility];  Service: Endoscopy;  Laterality: N/A;  . Fine needle aspiration 03/01/2011    Procedure: FINE NEEDLE  ASPIRATION (FNA) LINEAR;  Surgeon: Freddy Jaksch, MD;  Location: [facility];  Service: Endoscopy;  Laterality: N/A;  . Laparoscopic cholecystectomy 01-09-2011  . Anterior lumbar fusion 11-09-2010     L5 - S1  . Robotic-assisted right salpingo-oophorectomy w/ lysis adhesions 12-21-2009    HX ENDOMETRIOSIS AND PELVIC PAIN  . Cysto/ right retrograde pyelogram/ right ureteroscopy and stent placement 09-24-2009    HX RIGHT URETERAL CALCULUS  . Diagnostic laparoscopy 1992    ENDOMETRIOSIS  . Laparoscopic assisted vaginal hysterectomy 1997  . Ureteroscopic stone extractions     X3  . Cervical fusion 1999    C4 - 7  . Cervical spine surgery APRIL 2011    REMOVAL SCAR TISSUE  . Tonsillectomy AS CHILD  . Hernia repair AS CHILD  . Knee arthroscopy AUG 2013    RIGHT KNEE  . Cystoscopy with ureteroscopy 12/20/2011    Procedure: CYSTOSCOPY WITH URETEROSCOPY;  Surgeon: Milford Cage, MD;  Location: [facility];  Service: Urology;  Laterality: Right;    Family History  Problem Relation Age of Onset  . Cancer Father     History  Substance Use Topics  . Smoking status: Never Smoker   . Smokeless tobacco: Never Used  . Alcohol Use: No    OB History    Grav Para Term Preterm Abortions TAB SAB Ect Mult  Living                  Review of Systems  Gastrointestinal: Positive for abdominal pain.  All other systems reviewed and are negative.    Allergies  Zolpidem tartrate  Home Medications   Current Outpatient Rx  Name  Route  Sig  Dispense  Refill  . ALPRAZOLAM 1 MG PO TABS   Oral   Take 1 mg by mouth 4 (four) times daily.         . BUPROPION HCL ER (XL) 300 MG PO TB24   Oral   Take 300 mg by mouth daily.          . DULOXETINE HCL 30 MG PO CPEP   Oral   Take 90 mg by mouth daily.         Marland Kitchen GABAPENTIN 300 MG PO CAPS   Oral   Take 600 mg by mouth 3 (three) times daily.          Marland Kitchen HYDROCHLOROTHIAZIDE 25 MG PO TABS   Oral   Take 25 mg by  mouth daily with breakfast.          . HYDROMORPHONE HCL 2 MG PO TABS   Oral   Take 4 mg by mouth 3 (three) times daily as needed. Pain         . LEVOTHYROXINE SODIUM 112 MCG PO TABS   Oral   Take 112 mcg by mouth daily with breakfast.         . LORATADINE 10 MG PO TABS   Oral   Take 10 mg by mouth daily.          Marland Kitchen MAGNESIUM HYDROXIDE 400 MG/5ML PO SUSP   Oral   Take 30 mLs by mouth daily as needed. For constipation         . URIBEL 118 MG PO CAPS   Oral   Take 118 mg by mouth 4 (four) times daily as needed. uti         . METHOCARBAMOL 500 MG PO TABS   Oral   Take 500 mg by mouth 3 (three) times daily.          . ADULT MULTIVITAMIN W/MINERALS CH   Oral   Take 1 tablet by mouth daily.         Marland Kitchen PHENAZOPYRIDINE HCL 100 MG PO TABS   Oral   Take 2 tablets (200 mg total) by mouth every 8 (eight) hours as needed for pain (Burning urination.  Will turn urine and body fluids orange.).   30 tablet   1   . POTASSIUM CITRATE ER 15 MEQ (1620 MG) PO TBCR   Oral   Take 1 tablet by mouth 2 (two) times daily.         Marland Kitchen PROCTOFOAM HC 1-1 % RE FOAM   Rectal   Place 1 applicator rectally 2 (two) times daily as needed.          Marland Kitchen RANITIDINE HCL 150 MG PO TABS   Oral   Take 150 mg by mouth 2 (two) times daily.         Marland Kitchen TAMSULOSIN HCL 0.4 MG PO CAPS   Oral   Take 0.4 mg by mouth daily.         . CEPHALEXIN 500 MG PO CAPS   Oral   Take 1 capsule (500 mg total) by mouth 4 (four) times daily.   20 capsule   0   .  ONDANSETRON 8 MG PO TBDP   Oral   Take 1 tablet (8 mg total) by mouth every 8 (eight) hours as needed for nausea.   12 tablet   0     BP 113/93  Pulse 73  Temp 98.7 F (37.1 C) (Oral)  Resp 19  SpO2 100%  Physical Exam  Nursing note and vitals reviewed. Constitutional: She is oriented to person, place, and time. She appears well-developed and well-nourished. No distress.  HENT:  Head: Normocephalic and atraumatic.  Eyes: EOM are  normal.  Neck: Normal range of motion.  Cardiovascular: Normal rate, regular rhythm and normal heart sounds.   Pulmonary/Chest: Effort normal and breath sounds normal.  Abdominal: Soft. She exhibits no distension. There is no tenderness.  Musculoskeletal: Normal range of motion.  Neurological: She is alert and oriented to person, place, and time.  Skin: Skin is warm and dry.  Psychiatric: She has a normal mood and affect. Judgment normal.    ED Course  Procedures (including critical care time)  Labs Reviewed  COMPREHENSIVE METABOLIC PANEL - Abnormal; Notable for the following:    Potassium 3.0 (*)     Glucose, Bld 101 (*)     GFR calc non Af Amer 71 (*)     GFR calc Af Amer 82 (*)     All other components within normal limits  URINALYSIS, ROUTINE W REFLEX MICROSCOPIC - Abnormal; Notable for the following:    APPearance CLOUDY (*)     Nitrite POSITIVE (*)     Leukocytes, UA MODERATE (*)     All other components within normal limits  URINE MICROSCOPIC-ADD ON - Abnormal; Notable for the following:    Squamous Epithelial / LPF MANY (*)     Bacteria, UA MANY (*)     All other components within normal limits  CBC  LIPASE, BLOOD  URINE CULTURE   No results found.   1. Abdominal pain   2. Urinary tract infection       MDM  2:02 PM The patient is feeling better at this time.  She can keep ice chips down.  Discharge home in good condition.  I will treat her urinary tract infection which is likely the cause of her urinary frequency.  She has chronic pain medicine at home including Dilaudid.  Home with antinausea medicine and close PCP and GI followup.  She understands return the emergency apartment for new or worsening symptoms.  No indication for imaging her abdomen today.        Lyanne Co, MD 03/07/12 [phone]

## 2012-03-07 NOTE — ED Notes (Signed)
Patient reports that she has pain to her generalized pain to abdominal area with N/V/D since tues.

## 2012-03-09 LAB — URINE CULTURE: Colony Count: >=100000

## 2012-03-10 NOTE — ED Notes (Signed)
+  Urine. Patient treated with Keflex. Sensitive to same. Per protocol MD. °

## 2012-06-07 ENCOUNTER — Emergency Department (HOSPITAL_COMMUNITY): Payer: BC Managed Care – PPO

## 2012-06-07 ENCOUNTER — Observation Stay (HOSPITAL_COMMUNITY)
Admission: EM | Admit: 2012-06-07 | Discharge: 2012-06-08 | Disposition: A | Discharge status: Home / Self Care | Payer: BC Managed Care – PPO | Attending: Internal Medicine | Admitting: Internal Medicine

## 2012-06-07 ENCOUNTER — Encounter (HOSPITAL_COMMUNITY): Payer: Self-pay | Admitting: Emergency Medicine

## 2012-06-07 DIAGNOSIS — F29 Unspecified psychosis not due to a substance or known physiological condition: Secondary | ICD-10-CM | POA: Insufficient documentation

## 2012-06-07 DIAGNOSIS — Z8719 Personal history of other diseases of the digestive system: Secondary | ICD-10-CM

## 2012-06-07 DIAGNOSIS — R059 Cough, unspecified: Secondary | ICD-10-CM | POA: Insufficient documentation

## 2012-06-07 DIAGNOSIS — Z79899 Other long term (current) drug therapy: Secondary | ICD-10-CM | POA: Insufficient documentation

## 2012-06-07 DIAGNOSIS — R509 Fever, unspecified: Secondary | ICD-10-CM | POA: Insufficient documentation

## 2012-06-07 DIAGNOSIS — R0902 Hypoxemia: Secondary | ICD-10-CM | POA: Insufficient documentation

## 2012-06-07 DIAGNOSIS — N390 Urinary tract infection, site not specified: Secondary | ICD-10-CM | POA: Insufficient documentation

## 2012-06-07 DIAGNOSIS — J40 Bronchitis, not specified as acute or chronic: Principal | ICD-10-CM | POA: Insufficient documentation

## 2012-06-07 DIAGNOSIS — K219 Gastro-esophageal reflux disease without esophagitis: Secondary | ICD-10-CM | POA: Insufficient documentation

## 2012-06-07 DIAGNOSIS — R5383 Other fatigue: Secondary | ICD-10-CM | POA: Insufficient documentation

## 2012-06-07 DIAGNOSIS — R05 Cough: Secondary | ICD-10-CM | POA: Insufficient documentation

## 2012-06-07 DIAGNOSIS — E039 Hypothyroidism, unspecified: Secondary | ICD-10-CM | POA: Insufficient documentation

## 2012-06-07 DIAGNOSIS — K861 Other chronic pancreatitis: Secondary | ICD-10-CM | POA: Insufficient documentation

## 2012-06-07 DIAGNOSIS — F329 Major depressive disorder, single episode, unspecified: Secondary | ICD-10-CM | POA: Insufficient documentation

## 2012-06-07 DIAGNOSIS — J029 Acute pharyngitis, unspecified: Secondary | ICD-10-CM | POA: Insufficient documentation

## 2012-06-07 DIAGNOSIS — R5381 Other malaise: Secondary | ICD-10-CM | POA: Insufficient documentation

## 2012-06-07 DIAGNOSIS — R071 Chest pain on breathing: Secondary | ICD-10-CM | POA: Insufficient documentation

## 2012-06-07 DIAGNOSIS — G929 Unspecified toxic encephalopathy: Secondary | ICD-10-CM | POA: Insufficient documentation

## 2012-06-07 DIAGNOSIS — R404 Transient alteration of awareness: Secondary | ICD-10-CM | POA: Insufficient documentation

## 2012-06-07 DIAGNOSIS — G92 Toxic encephalopathy: Secondary | ICD-10-CM | POA: Insufficient documentation

## 2012-06-07 DIAGNOSIS — R0602 Shortness of breath: Secondary | ICD-10-CM | POA: Insufficient documentation

## 2012-06-07 DIAGNOSIS — R062 Wheezing: Secondary | ICD-10-CM | POA: Insufficient documentation

## 2012-06-07 DIAGNOSIS — F3289 Other specified depressive episodes: Secondary | ICD-10-CM | POA: Insufficient documentation

## 2012-06-07 LAB — COMPREHENSIVE METABOLIC PANEL
Albumin: 3.3 g/dL — ABNORMAL LOW (ref 3.5–5.2)
Alkaline Phosphatase: 116 U/L (ref 39–117)
BUN: 9 mg/dL (ref 6–23)
CO2: 31 mEq/L (ref 19–32)
Chloride: 96 mEq/L (ref 96–112)
GFR calc non Af Amer: 82 mL/min — ABNORMAL LOW (ref >90)
Glucose, Bld: 99 mg/dL (ref 70–99)
Potassium: 3.3 mEq/L — ABNORMAL LOW (ref 3.5–5.1)
Total Bilirubin: 0.3 mg/dL (ref 0.3–1.2)

## 2012-06-07 LAB — CREATININE, SERUM: Creatinine, Ser: 0.78 mg/dL (ref 0.50–1.10)

## 2012-06-07 LAB — CBC WITH DIFFERENTIAL/PLATELET
Basophils Relative: 0 % (ref 0–1)
Hemoglobin: 11.2 g/dL — ABNORMAL LOW (ref 12.0–15.0)
Lymphocytes Relative: 13 % (ref 12–46)
Lymphs Abs: 1.5 10*3/uL (ref 0.7–4.0)
Monocytes Relative: 6 % (ref 3–12)
Neutro Abs: 9 10*3/uL — ABNORMAL HIGH (ref 1.7–7.7)
Neutrophils Relative %: 79 % — ABNORMAL HIGH (ref 43–77)
RBC: 4.21 MIL/uL (ref 3.87–5.11)
WBC: 11.5 10*3/uL — ABNORMAL HIGH (ref 4.0–10.5)

## 2012-06-07 LAB — BLOOD GAS, ARTERIAL
Bicarbonate: 30.5 mEq/L — ABNORMAL HIGH (ref 20.0–24.0)
RATE: 14 resp/min
pH, Arterial: 7.4 (ref 7.350–7.450)
pO2, Arterial: 69.4 mmHg — ABNORMAL LOW (ref 80.0–100.0)

## 2012-06-07 LAB — CBC
MCH: 26.3 pg (ref 26.0–34.0)
MCHC: 31.3 g/dL (ref 30.0–36.0)
Platelets: 256 10*3/uL (ref 150–400)

## 2012-06-07 LAB — URINALYSIS, ROUTINE W REFLEX MICROSCOPIC
Glucose, UA: NEGATIVE mg/dL
Hgb urine dipstick: NEGATIVE
Ketones, ur: NEGATIVE mg/dL
Protein, ur: NEGATIVE mg/dL

## 2012-06-07 LAB — AMMONIA: Ammonia: 29 umol/L (ref 11–60)

## 2012-06-07 LAB — URINE MICROSCOPIC-ADD ON

## 2012-06-07 MED ORDER — ONDANSETRON HCL 4 MG PO TABS: 4.0000 mg | ORAL_TABLET | Freq: Four times a day (QID) | ORAL | Status: DC | PRN

## 2012-06-07 MED ORDER — NALOXONE HCL 0.4 MG/ML IJ SOLN: 0.4000 mg | INTRAMUSCULAR | Status: DC | PRN

## 2012-06-07 MED ORDER — PREDNISONE 20 MG PO TABS
60.0000 mg | ORAL_TABLET | Freq: Once | ORAL | Status: AC
  Administered 2012-06-07: 60 mg via ORAL
  Filled 2012-06-07: qty 3

## 2012-06-07 MED ORDER — ENOXAPARIN SODIUM 40 MG/0.4ML ~~LOC~~ SOLN: 40.0000 mg | SUBCUTANEOUS | Status: DC

## 2012-06-07 MED ORDER — POTASSIUM CHLORIDE IN NACL 20-0.9 MEQ/L-% IV SOLN
INTRAVENOUS | Status: DC
  Administered 2012-06-07 – 2012-06-08 (×2): via INTRAVENOUS
  Filled 2012-06-07 (×4): qty 1000

## 2012-06-07 MED ORDER — SODIUM CHLORIDE 0.9 % IV BOLUS (SEPSIS)
1000.0000 mL | Freq: Once | INTRAVENOUS | Status: AC
  Administered 2012-06-07: 1000 mL via INTRAVENOUS

## 2012-06-07 MED ORDER — ONDANSETRON HCL 4 MG/2ML IJ SOLN
4.0000 mg | Freq: Once | INTRAMUSCULAR | Status: AC
  Administered 2012-06-07: 4 mg via INTRAVENOUS
  Filled 2012-06-07: qty 2

## 2012-06-07 MED ORDER — HYDROMORPHONE HCL PF 1 MG/ML IJ SOLN: 1.0000 mg | Freq: Once | INTRAMUSCULAR | Status: DC

## 2012-06-07 MED ORDER — HYDROCORTISONE ACE-PRAMOXINE 1-1 % RE FOAM
1.0000 | Freq: Two times a day (BID) | RECTAL | Status: DC | PRN
  Filled 2012-06-07: qty 10

## 2012-06-07 MED ORDER — METHOCARBAMOL 500 MG PO TABS: 500.0000 mg | ORAL_TABLET | Freq: Three times a day (TID) | ORAL | Status: DC | PRN

## 2012-06-07 MED ORDER — SODIUM CHLORIDE 0.9 % IJ SOLN
3.0000 mL | Freq: Two times a day (BID) | INTRAMUSCULAR | Status: DC
  Administered 2012-06-07 – 2012-06-08 (×2): 3 mL via INTRAVENOUS

## 2012-06-07 MED ORDER — IPRATROPIUM BROMIDE 0.02 % IN SOLN
0.5000 mg | Freq: Once | RESPIRATORY_TRACT | Status: AC
  Administered 2012-06-07: 0.5 mg via RESPIRATORY_TRACT
  Filled 2012-06-07: qty 2.5

## 2012-06-07 MED ORDER — ENOXAPARIN SODIUM 60 MG/0.6ML ~~LOC~~ SOLN
50.0000 mg | SUBCUTANEOUS | Status: DC
  Administered 2012-06-07: 50 mg via SUBCUTANEOUS
  Filled 2012-06-07 (×2): qty 0.6

## 2012-06-07 MED ORDER — NALOXONE HCL 0.4 MG/ML IJ SOLN
0.4000 mg | INTRAMUSCULAR | Status: DC | PRN
  Administered 2012-06-07 (×2): 0.4 mg via INTRAVENOUS
  Filled 2012-06-07 (×2): qty 1

## 2012-06-07 MED ORDER — METHYLPREDNISOLONE SODIUM SUCC 125 MG IJ SOLR
125.0000 mg | Freq: Once | INTRAMUSCULAR | Status: AC
  Administered 2012-06-07: 125 mg via INTRAVENOUS
  Filled 2012-06-07: qty 2

## 2012-06-07 MED ORDER — ALBUTEROL (5 MG/ML) CONTINUOUS INHALATION SOLN
15.0000 mg | INHALATION_SOLUTION | Freq: Once | RESPIRATORY_TRACT | Status: AC
  Administered 2012-06-07: 15 mg via RESPIRATORY_TRACT
  Filled 2012-06-07: qty 20

## 2012-06-07 MED ORDER — FLUTICASONE PROPIONATE 50 MCG/ACT NA SUSP
2.0000 | Freq: Every day | NASAL | Status: DC | PRN
  Filled 2012-06-07: qty 16

## 2012-06-07 MED ORDER — ALBUTEROL SULFATE (5 MG/ML) 0.5% IN NEBU
5.0000 mg | INHALATION_SOLUTION | Freq: Once | RESPIRATORY_TRACT | Status: AC
  Administered 2012-06-07: 5 mg via RESPIRATORY_TRACT
  Filled 2012-06-07: qty 1

## 2012-06-07 MED ORDER — ALBUTEROL SULFATE (5 MG/ML) 0.5% IN NEBU: 2.5000 mg | INHALATION_SOLUTION | RESPIRATORY_TRACT | Status: DC | PRN

## 2012-06-07 MED ORDER — ALPRAZOLAM 0.5 MG PO TABS
1.0000 mg | ORAL_TABLET | Freq: Once | ORAL | Status: AC
  Administered 2012-06-07: 1 mg via ORAL
  Filled 2012-06-07: qty 2

## 2012-06-07 MED ORDER — HYDROMORPHONE HCL PF 1 MG/ML IJ SOLN
1.0000 mg | Freq: Once | INTRAMUSCULAR | Status: AC
  Administered 2012-06-07: 1 mg via INTRAVENOUS
  Filled 2012-06-07: qty 1

## 2012-06-07 MED ORDER — ALPRAZOLAM 0.5 MG PO TABS
0.5000 mg | ORAL_TABLET | Freq: Three times a day (TID) | ORAL | Status: DC | PRN
  Administered 2012-06-07: 0.5 mg via ORAL
  Filled 2012-06-07: qty 1

## 2012-06-07 MED ORDER — METHOCARBAMOL 500 MG PO TABS
500.0000 mg | ORAL_TABLET | Freq: Once | ORAL | Status: AC
  Administered 2012-06-07: 500 mg via ORAL
  Filled 2012-06-07: qty 1

## 2012-06-07 MED ORDER — LEVOTHYROXINE SODIUM 100 MCG IV SOLR
56.0000 ug | Freq: Every day | INTRAVENOUS | Status: DC
  Administered 2012-06-08: 56 ug via INTRAVENOUS
  Filled 2012-06-07: qty 5

## 2012-06-07 MED ORDER — HYDROMORPHONE HCL 2 MG PO TABS
4.0000 mg | ORAL_TABLET | Freq: Three times a day (TID) | ORAL | Status: DC | PRN
  Administered 2012-06-07: 4 mg via ORAL
  Filled 2012-06-07: qty 2

## 2012-06-07 MED ORDER — METHOCARBAMOL 100 MG/ML IJ SOLN: 500.0000 mg | Freq: Once | INTRAMUSCULAR | Status: DC

## 2012-06-07 MED ORDER — ONDANSETRON HCL 4 MG/2ML IJ SOLN
4.0000 mg | Freq: Four times a day (QID) | INTRAMUSCULAR | Status: DC | PRN
  Filled 2012-06-07: qty 2

## 2012-06-07 NOTE — ED Notes (Signed)
Patient presents with non-productive cough x 3 weeks and fever this morning.  Patient has been seen for the same and has tried several different ABX without improvement.

## 2012-06-07 NOTE — ED Provider Notes (Signed)
[name] is a 47 y.o. female who is here for 3 weeks of cough, nonproductive, with shortness of breath. She has been treated with several antibiotics during this time period. She has used a bronchodilator, but not steroid orally. She complains of right anterior chest pain with deep breathing and palpation. She does not smoke cigarettes. She has had a decreased appetite.  Initial vital signs normal except borderline fever at 100.2, and hypoxia. Lungs decreased air movement, bilaterally, right greater than left, with generalized wheezing. She has mild increased work of breathing.  Patient is obese.  Additional treatment ordered: Continuous albuterol nebulizer, oral prednisone, IV Dilaudid for chest wall pain.  Patient was initially hypoxic, it improved on nasal cannula oxygen. Trial off oxygen at 1300 hours caused her to drop her saturation to 85%. She was placed back on oxygen by nasal cannula and improved to 94%  Assessment: Likely bronchitis, etiology not clear, with secondary hypoxia. She will need to be admitted for stabilization and further diagnostic evaluation.  The d-dimer is 0.52, this is less than the cutoff of 1.0 FEU, making PE, very unlikely. I have a low clinical suspicion for PE and she has  a strong alternate diagnoses.  Plan admit to hospitalist service    Medications  predniSONE (DELTASONE) tablet 60 mg (not administered)  sodium chloride 0.9 % bolus 1,000 mL (0 mLs Intravenous Stopped 06/07/12 1132)  albuterol (PROVENTIL) (5 MG/ML) 0.5% nebulizer solution 5 mg (5 mg Nebulization Given 06/07/12 1021)  ipratropium (ATROVENT) nebulizer solution 0.5 mg (0.5 mg Nebulization Given 06/07/12 1021)  ALPRAZolam (XANAX) tablet 1 mg (1 mg Oral Given 06/07/12 1324)  HYDROmorphone (DILAUDID) injection 1 mg (1 mg Intravenous Given 06/07/12 1324)  methocarbamol (ROBAXIN) tablet 500 mg (500 mg Oral Given 06/07/12 1324)     Medical screening examination/treatment/procedure(s) were  conducted as a shared visit with non-physician practitioner(s) and myself.  I personally evaluated the patient during the encounter  Flint Melter, MD 06/08/12 1616

## 2012-06-07 NOTE — H&P (Addendum)
[facility] History and Physical  [name] WUJ:[id number] DOB: May 13, 1965 DOA: 06/07/2012  Referring physician: Dr. Mancel Bale. PCP: Lupita Raider, MD   Chief Complaint: Cough, shortness of breath   History of Present Illness: [name] is an 47 y.o. female with depression, chronic pancreatitis on up to 12 mg of dilaudid daily for pain management, but no prior history of lung disease or asthma who presents with a 3 week history of cough (non-productive).  Originally diagnosed with bronchitis and UTI and treated with a course of Cipro.  When this failed to alleviate her symptoms she was treated with Levaquin.  She also was given a 1 g dose of Rocephin intramuscularly. She initially felt a bit better, but then her symptoms worsened.  She has had some intermittent fever.  Has had pleuritic chest pain and sore throat. Takes chronic pain medications for chronic pain in her back, chest and lower knees.  The ER MD was concerned that she is hypoxic off oxygen.   She does have a history of GERD.  An ABG has not been checked nor has Narcan been given and on interview, the patient is too sedated to answer any of my questions. According to the ER nurse, she was given 1 mg of Xanax, 1 mg of Dilaudid-HP as well as 500 mg of Robaxin. Her current altered level of consciousness coincided with being given steroids.  The history was thus obtained from the patient's husband, who is sitting at her bedside. He became quite defensive when I questioned the amount of Dilaudid-HP that she takes at home, and insisted that she takes her medicines as prescribed and that she would not take extra doses or intentionally overdose on her pain medications.  Review of Systems: Constitutional: + fever, no chills;  Appetite normal; No weight loss, no weight gain.  HEENT: No blurry vision, no diplopia, + pharyngitis, no dysphagia CV: No chest pain, no palpitations.  Resp: No SOB, + cough. GI: + chronic nausea, no  vomiting, + occasional diarrhea, no melena, no hematochezia.  GU:+ dysuria, no hematuria.  MSK: + myalgias, + arthralgias.  Neuro:  + headache, no focal neurological deficits, no history of seizures.  Psych: No depression, no anxiety.  Endo: + thyroid disease, no DM, no heat intolerance, no cold intolerance, no polyuria, no polydipsia  Skin: No rashes, no skin lesions.  Heme: No easy bruising, no history of blood diseases.  Past Medical History Past Medical History  Diagnosis Date  . Depression   . GERD (gastroesophageal reflux disease)   . Hypothyroidism   . Pancreatic pseudocyst   . History of kidney stones   . IBS (irritable bowel syndrome)   . PONV (postoperative nausea and vomiting)   . History of pancreatitis 01-04-2011  . DDD (degenerative disc disease), cervical   . Right ureteral stone   . Muscle spasms of neck   . Cervical pain (neck)   . Right flank pain   . Nausea DUE TO PAIN FROM KIDNEY STONE  . Right knee pain   . Swelling of right knee joint      Past Surgical History Past Surgical History  Procedure Laterality Date  . Eus  03/01/2011    Procedure: UPPER ENDOSCOPIC ULTRASOUND (EUS) LINEAR;  Surgeon: Freddy Jaksch, MD;  Location: [facility];  Service: Endoscopy;  Laterality: N/A;  . Fine needle aspiration  03/01/2011    Procedure: FINE NEEDLE ASPIRATION (FNA) LINEAR;  Surgeon: Freddy Jaksch, MD;  Location: [facility];  Service: Endoscopy;  Laterality: N/A;  . Laparoscopic cholecystectomy  01-09-2011  . Anterior lumbar fusion  11-09-2010     L5 - S1  . Robotic-assisted right salpingo-oophorectomy w/ lysis adhesions  12-21-2009    HX ENDOMETRIOSIS AND PELVIC PAIN  . Cysto/ right retrograde pyelogram/ right ureteroscopy and stent placement  09-24-2009    HX RIGHT URETERAL CALCULUS  . Diagnostic laparoscopy  1992    ENDOMETRIOSIS  . Laparoscopic assisted vaginal hysterectomy  1997  . Ureteroscopic stone extractions      X3  . Cervical fusion  1999    C4  - 7  . Cervical spine surgery  APRIL 2011    REMOVAL SCAR TISSUE  . Tonsillectomy  AS CHILD  . Hernia repair  AS CHILD  . Knee arthroscopy  AUG 2013    RIGHT KNEE  . Cystoscopy with ureteroscopy  12/20/2011    Procedure: CYSTOSCOPY WITH URETEROSCOPY;  Surgeon: Milford Cage, MD;  Location: [facility];  Service: Urology;  Laterality: Right;     Social History: History   Social History  . Marital Status: Married    Spouse Name: Casimiro Needle    Number of Children: N/A  . Years of Education: N/A   Occupational History  . Unemployed    Social History Main Topics  . Smoking status: Never Smoker   . Smokeless tobacco: Never Used  . Alcohol Use: No  . Drug Use: No  . Sexually Active: No   Other Topics Concern  . Not on file   Social History Narrative   Married.  Lives in [place].  Uses a wheelchair since knee surgery 5 months ago.      Family History:  Family History  Problem Relation Age of Onset  . Cancer Father   . Fibromyalgia Sister     Allergies: Zolpidem tartrate  Meds: Prior to Admission medications   Medication Sig Start Date End Date Taking? Authorizing Provider  albuterol (PROVENTIL HFA;VENTOLIN HFA) 108 (90 BASE) MCG/ACT inhaler Inhale 2 puffs into the lungs every 6 (six) hours as needed for wheezing.   Yes Historical Provider, MD  ALPRAZolam Prudy Feeler) 1 MG tablet Take 1 mg by mouth 4 (four) times daily.   Yes Historical Provider, MD  buPROPion (WELLBUTRIN XL) 300 MG 24 hr tablet Take 300 mg by mouth daily.  01/05/11  Yes Historical Provider, MD  DULoxetine (CYMBALTA) 30 MG capsule Take 90 mg by mouth daily.   Yes Historical Provider, MD  fluticasone (FLONASE) 50 MCG/ACT nasal spray Place 2 sprays into the nose daily as needed for allergies.   Yes Historical Provider, MD  gabapentin (NEURONTIN) 300 MG capsule Take 600 mg by mouth 3 (three) times daily.  12/19/10  Yes Historical Provider, MD  hydrochlorothiazide (HYDRODIURIL) 25 MG  tablet Take 25 mg by mouth daily with breakfast.  10/30/10  Yes Historical Provider, MD  HYDROcodone-homatropine (HYCODAN) 5-1.5 MG/5ML syrup Take 5 mLs by mouth every 6 (six) hours as needed for cough.   Yes Historical Provider, MD  hydrocortisone-pramoxine [facility]) rectal foam Place 1 applicator rectally 2 (two) times daily as needed for hemorrhoids.   Yes Historical Provider, MD  HYDROmorphone (DILAUDID) 4 MG tablet Take 4 mg by mouth 3 (three) times daily as needed for pain.   Yes Historical Provider, MD  levothyroxine (SYNTHROID, LEVOTHROID) 112 MCG tablet Take 112 mcg by mouth daily with breakfast.   Yes Historical Provider, MD  loratadine (CLARITIN) 10 MG tablet Take 10 mg by mouth daily.  Yes Historical Provider, MD  Meth-Hyo-M Bl-Na Phos-Ph Sal (URIBEL) 118 MG CAPS Take 118 mg by mouth 4 (four) times daily as needed. uti   Yes Historical Provider, MD  methocarbamol (ROBAXIN) 500 MG tablet Take 500 mg by mouth 3 (three) times daily.  01/02/11  Yes Historical Provider, MD  Multiple Vitamin (MULTIVITAMIN WITH MINERALS) TABS Take 1 tablet by mouth daily.   Yes Historical Provider, MD  polycarbophil (FIBERCON) 625 MG tablet Take 625 mg by mouth 3 (three) times daily.   Yes Historical Provider, MD  polyethylene glycol (MIRALAX / GLYCOLAX) packet Take 17 g by mouth 2 (two) times daily.   Yes Historical Provider, MD  Potassium Citrate (UROCIT-K 15) 15 MEQ (1620 MG) TBCR Take 1 tablet by mouth daily.    Yes Historical Provider, MD  pregabalin (LYRICA) 100 MG capsule Take 100 mg by mouth 2 (two) times daily.   Yes Historical Provider, MD  promethazine (PHENERGAN) 25 MG tablet Take 25 mg by mouth every 6 (six) hours as needed for nausea.   Yes Historical Provider, MD  ranitidine (ZANTAC) 75 MG tablet Take 75 mg by mouth 2 (two) times daily as needed for heartburn.   Yes Historical Provider, MD  Tamsulosin HCl (FLOMAX) 0.4 MG CAPS Take 0.4 mg by mouth daily as needed (for kidney pain.).    Yes  Historical Provider, MD  cefTRIAXone (ROCEPHIN) 1 G injection Inject 1 g into the muscle once.    Historical Provider, MD  Pancrelipase, Lip-Prot-Amyl, (CREON) 24000 UNITS CPEP Take 1-2 capsules by mouth 3 (three) times daily with meals. 2 caps with meals, 1 cap with snacks.    Historical Provider, MD    Physical Exam: Filed Vitals:   06/07/12 1400 06/07/12 1500 06/07/12 1511 06/07/12 1547  BP:    112/64  Pulse: 80 76  82  Temp:      TempSrc:      Resp:    20  SpO2: 85% 95% 93% 93%     Physical Exam: Blood pressure 112/64, pulse 82, temperature 100.2 F (37.9 C), temperature source Oral, resp. rate 20, SpO2 93.00%. Gen: No acute distress. Sedated and unable to answer my questions.   Head: Normocephalic, atraumatic. Eyes: PERRL, EOMI, sclerae nonicteric. Mouth: Oropharynx clear. No tonsillar exudates. Neck: Supple, no thyromegaly, no lymphadenopathy, no jugular venous distention.  No nuchal rigidity. Chest: Lungs clear to auscultation bilaterally. I hear no wheezes, rales, or rhonchi. CV: Heart sounds are regular. Abdomen: Soft, nontender, nondistended with normal active bowel sounds. Extremities: Extremities are without clubbing, edema, or cyanosis. Skin: Warm and dry. Neuro: Somnolent; cranial nerves II through XII grossly intact. Psych:  Sedated.  Labs on Admission:  Basic Metabolic Panel:  Recent Labs Lab 06/07/12 0940  NA 137  K 3.3*  CL 96  CO2 31  GLUCOSE 99  BUN 9  CREATININE 0.84  CALCIUM 9.2   Liver Function Tests:  Recent Labs Lab 06/07/12 0940  AST 17  ALT 12  ALKPHOS 116  BILITOT 0.3  PROT 6.6  ALBUMIN 3.3*   CBC:  Recent Labs Lab 06/07/12 0940  WBC 11.5*  NEUTROABS 9.0*  HGB 11.2*  HCT 34.9*  MCV 82.9  PLT 233   Radiological Exams on Admission: Dg Chest 2 View  06/07/2012  *RADIOLOGY REPORT*  Clinical Data: Bronchitis, shortness of breath, cough.  CHEST - 2 VIEW  Comparison: 10/28/2011  Findings: Cervical fixation hardware noted.   Vascular clips in the right upper abdomen. Lungs clear.  Heart size  and pulmonary vascularity normal.  No effusion.  Visualized bones unremarkable.  IMPRESSION: No acute disease   Original Report Authenticated By: D. Andria Rhein, MD    Ct Head Wo Contrast  06/07/2012  *RADIOLOGY REPORT*  Clinical Data:  Sudden onset of altered mental status.  CT HEAD WITHOUT CONTRAST  Technique: Contiguous axial images were obtained from the base of the skull through the vertex without contrast.  Comparison: 03/03/2009 [facility].  Findings: Normal appearing cerebral hemispheres and posterior fossa structures.  Normal size and position of the ventricles.  No intracranial hemorrhage, mass lesion or evidence of acute infarction.  Unremarkable bones and included portions of the paranasal sinuses.  IMPRESSION: Normal examination.   Original Report Authenticated By: Beckie Salts, M.D.     Assessment/Plan Principal Problem:   Hypoxia in the setting of recent bronchitis and cough with toxic encephalopathy -The patient has no history of lung disease, a normal chest x-ray, no wheezing on my exam, and an unimpressive ABG which suggests that the hypoxia is from hypoventilation in the setting of either an idiosyncratic reaction to prednisone or polypharmacy with benzodiazepines and narcotics contributory. -Given the resolution of her wheeze with sedation, I suspect she has vocal cord dysfunction or an emotional component to her wheezing. -Would hold all sedating medications for now. -No nuchal rigidity to suggest meningitis but if she does not improve rapidly, could consider a lumbar puncture and an EEG. -Nursing staff reports that there was an abrupt change in her mental status after being given steroids. -Check urine drug screen and ammonia levels as well as TSH.  A CT of the head was unremarkable. -Supportive care for now. Admit to step down unit until her mentation improves. Active Problems:   Hx of pancreatitis -Hold narcotics.  Hold pancreatic enzyme replacement therapy as she is currently n.p.o. until her mental status improves.   Hypothyroid -Give Synthroid intravenously for now.   Depression -Resume Cymbalta and Wellbutrin when she is able to take by mouth medications.   GERD (gastroesophageal reflux disease) -Give PPI therapy IV.   Code Status: Full. Family Communication: Casimiro Needle (husband) [phone] Disposition Plan: Admit, home when stable.  Time spent: 90 minutes.  Donzel Romack [facility] Pager [phone]  If 7PM-7AM, please contact night-coverage [email or web address] Password [facility] 06/07/2012, 5:43 PM

## 2012-06-07 NOTE — ED Notes (Signed)
Report called to Christine RN

## 2012-06-07 NOTE — ED Provider Notes (Signed)
History     CSN: [id number]  Arrival date & time 06/07/12  0910   First MD Initiated Contact with Patient 06/07/12 [phone]      Chief Complaint  Patient presents with  . Cough    (Consider location/radiation/quality/duration/timing/severity/associated sxs/prior treatment) HPI Comments: This is a 47 year old woman who presents today for cough and shortness of breath after failing several antibiotics including Levaquin and rocephin. This has been an ongoing problem for 3 weeks. She has a nonproductive cough that is severe and constant. She has chest pain from the cough that is worse with deep breaths. She has been using ProAir. This has provided little relief as she states she is worsening. No history of lung disease, never smoked. She also complains of dysuria and burning. She states this is fairly chronic and is going to see a urologist next week. She has a history of chronic pancreatitis for which she states she takes 4mg  of Dilaudid TID.   Patient is a 47 y.o. female presenting with cough. The history is provided by the patient and the spouse. No language interpreter was used.  Cough Cough characteristics:  Non-productive Severity:  Severe Onset quality:  Gradual Duration:  3 weeks Timing:  Constant Progression:  Worsening Chronicity:  New Smoker: no   Context: upper respiratory infection   Associated symptoms: chills, fever and headaches     Past Medical History  Diagnosis Date  . Depression   . GERD (gastroesophageal reflux disease)   . Hypothyroidism   . Pancreatic pseudocyst   . History of kidney stones   . IBS (irritable bowel syndrome)   . PONV (postoperative nausea and vomiting)   . History of pancreatitis 01-04-2011  . DDD (degenerative disc disease), cervical   . Right ureteral stone   . Muscle spasms of neck   . Cervical pain (neck)   . Right flank pain   . Nausea DUE TO PAIN FROM KIDNEY STONE  . Right knee pain   . Swelling of right knee joint     Past  Surgical History  Procedure Laterality Date  . Eus  03/01/2011    Procedure: UPPER ENDOSCOPIC ULTRASOUND (EUS) LINEAR;  Surgeon: Freddy Jaksch, MD;  Location: [facility];  Service: Endoscopy;  Laterality: N/A;  . Fine needle aspiration  03/01/2011    Procedure: FINE NEEDLE ASPIRATION (FNA) LINEAR;  Surgeon: Freddy Jaksch, MD;  Location: [facility];  Service: Endoscopy;  Laterality: N/A;  . Laparoscopic cholecystectomy  01-09-2011  . Anterior lumbar fusion  11-09-2010     L5 - S1  . Robotic-assisted right salpingo-oophorectomy w/ lysis adhesions  12-21-2009    HX ENDOMETRIOSIS AND PELVIC PAIN  . Cysto/ right retrograde pyelogram/ right ureteroscopy and stent placement  09-24-2009    HX RIGHT URETERAL CALCULUS  . Diagnostic laparoscopy  1992    ENDOMETRIOSIS  . Laparoscopic assisted vaginal hysterectomy  1997  . Ureteroscopic stone extractions      X3  . Cervical fusion  1999    C4 - 7  . Cervical spine surgery  APRIL 2011    REMOVAL SCAR TISSUE  . Tonsillectomy  AS CHILD  . Hernia repair  AS CHILD  . Knee arthroscopy  AUG 2013    RIGHT KNEE  . Cystoscopy with ureteroscopy  12/20/2011    Procedure: CYSTOSCOPY WITH URETEROSCOPY;  Surgeon: Milford Cage, MD;  Location: [facility];  Service: Urology;  Laterality: Right;    Family History  Problem Relation Age  of Onset  . Cancer Father     History  Substance Use Topics  . Smoking status: Never Smoker   . Smokeless tobacco: Never Used  . Alcohol Use: No    OB History   Grav Para Term Preterm Abortions TAB SAB Ect Mult Living                  Review of Systems  Constitutional: Positive for fever and chills.  Respiratory: Positive for cough.   Gastrointestinal: Negative for vomiting.  Genitourinary: Positive for dysuria. Negative for hematuria.  Neurological: Positive for headaches.  All other systems reviewed and are negative.    Allergies  Zolpidem tartrate  Home Medications    Current Outpatient Rx  Name  Route  Sig  Dispense  Refill  . albuterol (PROVENTIL HFA;VENTOLIN HFA) 108 (90 BASE) MCG/ACT inhaler   Inhalation   Inhale 2 puffs into the lungs every 6 (six) hours as needed for wheezing.         Marland Kitchen ALPRAZolam (XANAX) 1 MG tablet   Oral   Take 1 mg by mouth 4 (four) times daily.         Marland Kitchen buPROPion (WELLBUTRIN XL) 300 MG 24 hr tablet   Oral   Take 300 mg by mouth daily.          . DULoxetine (CYMBALTA) 30 MG capsule   Oral   Take 90 mg by mouth daily.         . fluticasone (FLONASE) 50 MCG/ACT nasal spray   Nasal   Place 2 sprays into the nose daily as needed for allergies.         Marland Kitchen gabapentin (NEURONTIN) 300 MG capsule   Oral   Take 600 mg by mouth 3 (three) times daily.          . hydrocortisone-pramoxine (PROCTOFOAM-HC) rectal foam   Rectal   Place 1 applicator rectally 2 (two) times daily as needed for hemorrhoids.         Marland Kitchen HYDROmorphone (DILAUDID) 4 MG tablet   Oral   Take 4 mg by mouth 3 (three) times daily as needed for pain.         Marland Kitchen levothyroxine (SYNTHROID, LEVOTHROID) 112 MCG tablet   Oral   Take 112 mcg by mouth daily with breakfast.         . loratadine (CLARITIN) 10 MG tablet   Oral   Take 10 mg by mouth daily.          . methocarbamol (ROBAXIN) 500 MG tablet   Oral   Take 500 mg by mouth 3 (three) times daily.          . Pancrelipase, Lip-Prot-Amyl, (CREON) 24000 UNITS CPEP   Oral   Take 1-2 capsules by mouth 3 (three) times daily with meals. 2 caps with meals, 1 cap with snacks.         . polycarbophil (FIBERCON) 625 MG tablet   Oral   Take 625 mg by mouth 3 (three) times daily.         . polyethylene glycol (MIRALAX / GLYCOLAX) packet   Oral   Take 17 g by mouth 2 (two) times daily.         . pregabalin (LYRICA) 100 MG capsule   Oral   Take 100 mg by mouth 2 (two) times daily.         . promethazine (PHENERGAN) 25 MG tablet   Oral   Take 25 mg by mouth  every 6 (six)  hours as needed for nausea.         . ranitidine (ZANTAC) 75 MG tablet   Oral   Take 75 mg by mouth 2 (two) times daily as needed for heartburn.         . hydrochlorothiazide (HYDRODIURIL) 25 MG tablet   Oral   Take 25 mg by mouth daily with breakfast.          . magnesium hydroxide (MILK OF MAGNESIA) 400 MG/5ML suspension   Oral   Take 30 mLs by mouth daily as needed. For constipation         . Meth-Hyo-M Bl-Na Phos-Ph Sal (URIBEL) 118 MG CAPS   Oral   Take 118 mg by mouth 4 (four) times daily as needed. uti         . Multiple Vitamin (MULTIVITAMIN WITH MINERALS) TABS   Oral   Take 1 tablet by mouth daily.         . Potassium Citrate (UROCIT-K 15) 15 MEQ (1620 MG) TBCR   Oral   Take 1 tablet by mouth 2 (two) times daily.         . Tamsulosin HCl (FLOMAX) 0.4 MG CAPS   Oral   Take 0.4 mg by mouth daily.           BP 106/56  Pulse 94  Temp(Src) 100.2 F (37.9 C) (Oral)  Resp 24  SpO2 96%  Physical Exam  Nursing note and vitals reviewed. Constitutional: She is oriented to person, place, and time. She appears well-developed and well-nourished. She appears distressed.  obese  HENT:  Head: Normocephalic and atraumatic.  Right Ear: External ear normal.  Left Ear: External ear normal.  Nose: Nose normal.  Mouth/Throat: Oropharynx is clear and moist.  Eyes: Conjunctivae and lids are normal.  Neck: Trachea normal, normal range of motion and phonation normal. No tracheal deviation present.  Cardiovascular: Normal rate, regular rhythm, normal heart sounds, intact distal pulses and normal pulses.   Pulmonary/Chest: No stridor. Tachypnea noted. She has wheezes (diffuse expiratory).  Decreased air movement  Hypoxic   Abdominal: Soft. Normal appearance. She exhibits no distension.  Musculoskeletal: Normal range of motion.  Neurological: She is alert and oriented to person, place, and time. She has normal strength.  Skin: Skin is warm and dry. She is not  diaphoretic. No erythema.  Psychiatric: She has a normal mood and affect. Her behavior is normal.    ED Course  Procedures (including critical care time)  Labs Reviewed  CBC WITH DIFFERENTIAL - Abnormal; Notable for the following:    WBC 11.5 (*)    Hemoglobin 11.2 (*)    HCT 34.9 (*)    Neutrophils Relative 79 (*)    Neutro Abs 9.0 (*)    All other components within normal limits  COMPREHENSIVE METABOLIC PANEL - Abnormal; Notable for the following:    Potassium 3.3 (*)    Albumin 3.3 (*)    GFR calc non Af Amer 82 (*)    All other components within normal limits  URINALYSIS, ROUTINE W REFLEX MICROSCOPIC - Abnormal; Notable for the following:    APPearance CLOUDY (*)    Leukocytes, UA MODERATE (*)    All other components within normal limits  URINE MICROSCOPIC-ADD ON - Abnormal; Notable for the following:    Squamous Epithelial / LPF FEW (*)    All other components within normal limits  D-DIMER, QUANTITATIVE - Abnormal; Notable for the following:    D-Dimer,  Quant 0.52 (*)    All other components within normal limits  BLOOD GAS, ARTERIAL - Abnormal; Notable for the following:    pCO2 arterial 50.2 (*)    pO2, Arterial 69.4 (*)    Bicarbonate 30.5 (*)    Acid-Base Excess 5.2 (*)    All other components within normal limits  URINE CULTURE  URINE RAPID DRUG SCREEN (HOSP PERFORMED)  CG4 I-STAT (LACTIC ACID)   Dg Chest 2 View  06/07/2012  *RADIOLOGY REPORT*  Clinical Data: Bronchitis, shortness of breath, cough.  CHEST - 2 VIEW  Comparison: 10/28/2011  Findings: Cervical fixation hardware noted.  Vascular clips in the right upper abdomen. Lungs clear.  Heart size and pulmonary vascularity normal.  No effusion.  Visualized bones unremarkable.  IMPRESSION: No acute disease   Original Report Authenticated By: D. Andria Rhein, MD    Ct Head Wo Contrast  06/07/2012  *RADIOLOGY REPORT*  Clinical Data:  Sudden onset of altered mental status.  CT HEAD WITHOUT CONTRAST  Technique:  Contiguous axial images were obtained from the base of the skull through the vertex without contrast.  Comparison: 03/03/2009 [facility].  Findings: Normal appearing cerebral hemispheres and posterior fossa structures.  Normal size and position of the ventricles.  No intracranial hemorrhage, mass lesion or evidence of acute infarction.  Unremarkable bones and included portions of the paranasal sinuses.  IMPRESSION: Normal examination.   Original Report Authenticated By: Beckie Salts, M.D.      1. Bronchitis   2. Hypoxia   3. UTI (lower urinary tract infection)       MDM  Patient presents today with a 3 week history of a nonproductive cough for which she has been seen and treated with several abx including Levaquin and Rocephin. Last dose of abx was Sunday and her condition is worsening. Patients symptoms and exam not improved after neb treatment. Room air trial was done at 1300. She dipped down to 85% quickly. Hanna at 2L resumed. D Dimer low. Low suspicion for PE.  Patient placed on continuous neb treatment and prednisone given. The attending saw this patient and agrees with plan. Plan to admit this patient to hospitalist service.         Mora Bellman, PA-C 06/08/12 [phone]

## 2012-06-07 NOTE — ED Notes (Signed)
[name] [phone] cell number

## 2012-06-07 NOTE — ED Notes (Addendum)
RT Misty Stanley notified of orders

## 2012-06-08 DIAGNOSIS — R0902 Hypoxemia: Secondary | ICD-10-CM

## 2012-06-08 DIAGNOSIS — G92 Toxic encephalopathy: Secondary | ICD-10-CM

## 2012-06-08 LAB — BASIC METABOLIC PANEL
BUN: 8 mg/dL (ref 6–23)
Calcium: 8.8 mg/dL (ref 8.4–10.5)
Creatinine, Ser: 0.63 mg/dL (ref 0.50–1.10)
GFR calc non Af Amer: >90 mL/min (ref >90)
Glucose, Bld: 158 mg/dL — ABNORMAL HIGH (ref 70–99)

## 2012-06-08 LAB — GLUCOSE, CAPILLARY: Glucose-Capillary: 140 mg/dL — ABNORMAL HIGH (ref 70–99)

## 2012-06-08 LAB — CBC
MCH: 26.1 pg (ref 26.0–34.0)
MCHC: 31.1 g/dL (ref 30.0–36.0)
Platelets: 242 10*3/uL (ref 150–400)

## 2012-06-08 LAB — MRSA PCR SCREENING: MRSA by PCR: POSITIVE — AB

## 2012-06-08 MED ORDER — GUAIFENESIN 100 MG/5ML PO SOLN: 200.0000 mg | ORAL | Status: DC | PRN

## 2012-06-08 MED ORDER — CHLORHEXIDINE GLUCONATE CLOTH 2 % EX PADS
6.0000 | MEDICATED_PAD | Freq: Every day | CUTANEOUS | Status: DC
  Administered 2012-06-08: 6 via TOPICAL

## 2012-06-08 MED ORDER — AZITHROMYCIN 500 MG PO TABS
500.0000 mg | ORAL_TABLET | Freq: Once | ORAL | Status: AC
  Administered 2012-06-08: 500 mg via ORAL
  Filled 2012-06-08: qty 1

## 2012-06-08 MED ORDER — MUPIROCIN 2 % EX OINT
1.0000 "application " | TOPICAL_OINTMENT | Freq: Two times a day (BID) | CUTANEOUS | Status: DC
  Administered 2012-06-08: 1 via NASAL
  Filled 2012-06-08: qty 22

## 2012-06-08 MED ORDER — AZITHROMYCIN 250 MG PO TABS: ORAL_TABLET | ORAL | Status: DC

## 2012-06-08 NOTE — Progress Notes (Signed)
Patient alert and orientedx4.  Patient verbalized understanding of discharge materials using teach back method (MRSA information, new medications, follow up appointment, etc).  Patient discharged to home with husband. [name]

## 2012-06-08 NOTE — Progress Notes (Signed)
Late entry . Pt tx from the ED Husband at bedside, belongings with Pt. Becoming less lethargic  more aware of her surroundings, v/s stable. Is alert enough to drink some liquids and answer simple yes and no questions. Will continue to monitor.

## 2012-06-08 NOTE — Progress Notes (Signed)
UR completed 

## 2012-06-08 NOTE — Discharge Summary (Signed)
Physician Discharge Summary  [name] OZH:[id number] DOB: 1965/09/11 DOA: 06/07/2012  PCP: Lupita Raider, MD  Admit date: 06/07/2012 Discharge date: 06/08/2012  Time spent: 25 minutes  Recommendations for Outpatient Follow-up:  1. Home with outpt follow up  Discharge Diagnoses:   Principal Problem:   Hypoxia in the setting of recent bronchitis and cough  Active Problems:   Toxic encephalopathy   Hx of pancreatitis   Hypothyroid   Depression   GERD (gastroesophageal reflux disease)   Discharge Condition: fair  Diet recommendation: regular  Filed Weights   06/07/12 2100  Weight: 105.7 kg (233 lb 0.4 oz)    History of present illness:  Please refer to admission H&P for detail but in brief, 47 y.o. female with depression, chronic pancreatitis on up to 12 mg of dilaudid daily for pain management,  who presents with a 3 week history of cough (non-productive). Originally diagnosed with bronchitis and UTI and treated with a course of Cipro as outpatient. When this failed to alleviate her symptoms she was treated with Levaquin. She also was given a 1 g dose of Rocephin intramuscularly. She initially felt a bit better, but then her symptoms worsened. She has had some intermittent fever. Has had pleuritic chest pain and sore throat. Given her symptoms she was given a dose of IV Solu Medrol. she was also given 1 mg of Xanax, 1 mg of Dilaudid-HP as well as 500 mg of Robaxin. Shortly following this she was noted to be confused and lethargic. Her current altered level of consciousness coincided with being given steroids. Lab work done was unremarkable. Chest x-ray negative for any infiltrates. An ABG done was unremarkable as well. Patient admitted to medical floor for monitoring of acute change in mental status. Patient denies taking any new or extra doses of her medications.   Hospital Course:  Shortness of breath with cough and toxic encephalopathy Patient's lung exam on admission  was unremarkable. Chest x-ray and ABG were unremarkable. I agree that the hypoxia was likely from hypoventilation which could be a reaction to Solu Medrol or polypharmacy with benzos and narcotics. Patient admitted to step down for closer monitoring. All percentages were held. Ammonia and TSH levels were unremarkable. Head CT was unremarkable. Mental status improved later in the evening and has been back to baseline. -She is clinically stable and although still has some cough maintaining O2 sat on room air.-She is clinically stable and in be discharged home with outpatient followup. -Regarding her ongoing bronchitis-like symptoms I have prescribed her a course of Z-Pak -Patient informs that she had a bout of pancreatitis after receiving steroid in the past. We have documented prednisone has a severe allergy for her.  History of pancreatitis She is on quite a high dose of Dilaudid and follows in the pain clinic. I will resume all her medications  Hypothyroidism TSH stable. Resume Synthroid  Depression Resume home medications   Procedures:  None  Consultations:  None  Discharge Exam: Filed Vitals:   06/08/12 0500 06/08/12 0600 06/08/12 0700 06/08/12 0800  BP: 120/68 137/72 127/69 132/79  Pulse: 86 80 81 81  Temp:    97.6 F (36.4 C)  TempSrc:    Oral  Resp: 12  13 13   Height:      Weight:      SpO2: 95%  98% 97%    General: Middle aged female lying in bed in no acute distress HEENT: No pallor, moist oral mucosa CVS: Normal S1 and S2, no murmurs rub  or gallop Chest: Clear to auscultation bilaterally no added sounds Abdomen: Soft, nondistended, nontender, bowel sounds present Extremities: Warm, no edema CNS: AAO x3   Discharge Instructions     Medication List    STOP taking these medications       cefTRIAXone 1 G injection  Commonly known as:  ROCEPHIN      TAKE these medications       albuterol 108 (90 BASE) MCG/ACT inhaler  Commonly known as:  PROVENTIL  HFA;VENTOLIN HFA  Inhale 2 puffs into the lungs every 6 (six) hours as needed for wheezing.     ALPRAZolam 1 MG tablet  Commonly known as:  XANAX  Take 1 mg by mouth 4 (four) times daily.     azithromycin 250 MG tablet  Commonly known as:  ZITHROMAX  Daily for 4 days  Start taking on:  06/09/2012     buPROPion 300 MG 24 hr tablet  Commonly known as:  WELLBUTRIN XL  Take 300 mg by mouth daily.     CREON 24000 UNITS Cpep  Generic drug:  Pancrelipase (Lip-Prot-Amyl)  Take 1-2 capsules by mouth 3 (three) times daily with meals. 2 caps with meals, 1 cap with snacks.     DULoxetine 30 MG capsule  Commonly known as:  CYMBALTA  Take 90 mg by mouth daily.     fluticasone 50 MCG/ACT nasal spray  Commonly known as:  FLONASE  Place 2 sprays into the nose daily as needed for allergies.     gabapentin 300 MG capsule  Commonly known as:  NEURONTIN  Take 600 mg by mouth 3 (three) times daily.     guaiFENesin 100 MG/5ML Soln  Commonly known as:  ROBITUSSIN  Take 10 mLs (200 mg total) by mouth every 4 (four) hours as needed.     hydrochlorothiazide 25 MG tablet  Commonly known as:  HYDRODIURIL  Take 25 mg by mouth daily with breakfast.     HYDROcodone-homatropine 5-1.5 MG/5ML syrup  Commonly known as:  HYCODAN  Take 5 mLs by mouth every 6 (six) hours as needed for cough.     hydrocortisone-pramoxine rectal foam  Commonly known as:  PROCTOFOAM-HC  Place 1 applicator rectally 2 (two) times daily as needed for hemorrhoids.     HYDROmorphone 4 MG tablet  Commonly known as:  DILAUDID  Take 4 mg by mouth 3 (three) times daily as needed for pain.     levothyroxine 112 MCG tablet  Commonly known as:  SYNTHROID, LEVOTHROID  Take 112 mcg by mouth daily with breakfast.     loratadine 10 MG tablet  Commonly known as:  CLARITIN  Take 10 mg by mouth daily.     methocarbamol 500 MG tablet  Commonly known as:  ROBAXIN  Take 500 mg by mouth 3 (three) times daily.     multivitamin with  minerals Tabs  Take 1 tablet by mouth daily.     polycarbophil 625 MG tablet  Commonly known as:  FIBERCON  Take 625 mg by mouth 3 (three) times daily.     polyethylene glycol packet  Commonly known as:  MIRALAX / GLYCOLAX  Take 17 g by mouth 2 (two) times daily.     pregabalin 100 MG capsule  Commonly known as:  LYRICA  Take 100 mg by mouth 2 (two) times daily.     promethazine 25 MG tablet  Commonly known as:  PHENERGAN  Take 25 mg by mouth every 6 (six) hours as needed for nausea.  ranitidine 75 MG tablet  Commonly known as:  ZANTAC  Take 75 mg by mouth 2 (two) times daily as needed for heartburn.     tamsulosin 0.4 MG Caps  Commonly known as:  FLOMAX  Take 0.4 mg by mouth daily as needed (for kidney pain.).     URIBEL 118 MG Caps  Take 118 mg by mouth 4 (four) times daily as needed. uti     UROCIT-K 15 15 MEQ (1620 MG) Tbcr  Generic drug:  Potassium Citrate  Take 1 tablet by mouth daily.           Follow-up Information   Follow up with SHAW,KIMBERLEE, MD In 1 week.   Contact information:   [address] [place] [address] [phone]        The results of significant diagnostics from this hospitalization (including imaging, microbiology, ancillary and laboratory) are listed below for reference.    Significant Diagnostic Studies: Dg Chest 2 View  06/07/2012  *RADIOLOGY REPORT*  Clinical Data: Bronchitis, shortness of breath, cough.  CHEST - 2 VIEW  Comparison: 10/28/2011  Findings: Cervical fixation hardware noted.  Vascular clips in the right upper abdomen. Lungs clear.  Heart size and pulmonary vascularity normal.  No effusion.  Visualized bones unremarkable.  IMPRESSION: No acute disease   Original Report Authenticated By: D. Andria Rhein, MD    Ct Head Wo Contrast  06/07/2012  *RADIOLOGY REPORT*  Clinical Data:  Sudden onset of altered mental status.  CT HEAD WITHOUT CONTRAST  Technique: Contiguous axial images were obtained from the  base of the skull through the vertex without contrast.  Comparison: 03/03/2009 [facility].  Findings: Normal appearing cerebral hemispheres and posterior fossa structures.  Normal size and position of the ventricles.  No intracranial hemorrhage, mass lesion or evidence of acute infarction.  Unremarkable bones and included portions of the paranasal sinuses.  IMPRESSION: Normal examination.   Original Report Authenticated By: Beckie Salts, M.D.     Microbiology: Recent Results (from the past 240 hour(s))  MRSA PCR SCREENING     Status: Abnormal   Collection Time    06/07/12  8:38 PM      Result Value Range Status   MRSA by PCR POSITIVE (*) NEGATIVE Final   Comment:            The GeneXpert MRSA Assay (FDA     approved for NASAL specimens     only), is one component of a     comprehensive MRSA colonization     surveillance program. It is not     intended to diagnose MRSA     infection nor to guide or     monitor treatment for     MRSA infections.     RESULT CALLED TO, READ BACK BY AND VERIFIED WITH:     W.TOWNSEND AT 0805 ON [id number] BY C.BONGEL     Labs: Basic Metabolic Panel:  Recent Labs Lab 06/07/12 0940 06/07/12 2018 06/08/12 0416  NA 137  --  137  K 3.3*  --  3.8  CL 96  --  101  CO2 31  --  28  GLUCOSE 99  --  158*  BUN 9  --  8  CREATININE 0.84 0.78 0.63  CALCIUM 9.2  --  8.8   Liver Function Tests:  Recent Labs Lab 06/07/12 0940  AST 17  ALT 12  ALKPHOS 116  BILITOT 0.3  PROT 6.6  ALBUMIN 3.3*   No results  found for this basename: LIPASE, AMYLASE,  in the last 168 hours  Recent Labs Lab 06/07/12 2018  AMMONIA 29   CBC:  Recent Labs Lab 06/07/12 0940 06/07/12 2018 06/08/12 0416  WBC 11.5* 12.7* 11.8*  NEUTROABS 9.0*  --   --   HGB 11.2* 11.5* 10.6*  HCT 34.9* 36.8 34.1*  MCV 82.9 84.2 84.0  PLT 233 256 242   Cardiac Enzymes: No results found for this basename: CKTOTAL, CKMB, CKMBINDEX, TROPONINI,  in the last 168 hours BNP: BNP (last 3  results) No results found for this basename: PROBNP,  in the last 8760 hours CBG:  Recent Labs Lab 06/08/12 0803  GLUCAP 140*       Signed:  Roslynn Holte  [facility] 06/08/2012, 9:04 AM

## 2012-06-09 LAB — URINE CULTURE: Colony Count: >=100000

## 2012-10-22 ENCOUNTER — Encounter (HOSPITAL_COMMUNITY): Payer: Self-pay | Admitting: *Deleted

## 2012-10-22 ENCOUNTER — Telehealth (HOSPITAL_COMMUNITY): Payer: Self-pay | Admitting: *Deleted

## 2012-10-22 ENCOUNTER — Ambulatory Visit (HOSPITAL_COMMUNITY)
Admission: RE | Admit: 2012-10-22 | Discharge: 2012-10-22 | Disposition: A | Discharge status: Home / Self Care | Payer: BC Managed Care – PPO | Attending: Psychiatry | Admitting: Psychiatry

## 2012-10-22 DIAGNOSIS — F39 Unspecified mood [affective] disorder: Secondary | ICD-10-CM | POA: Insufficient documentation

## 2012-10-22 DIAGNOSIS — F1120 Opioid dependence, uncomplicated: Secondary | ICD-10-CM | POA: Insufficient documentation

## 2012-10-22 NOTE — BH Assessment (Addendum)
Assessment Note  [name] is a 47 y.o. married white female.  She presents accompanied by her spouse, Livie Vanderhoof, who remained for assessment with pt's verbal consent.  She presents seeking help with opioid withdrawal.  Stressors: Pt reports problems with chronic pancreatitis accompanied by chronic pain.  She also has pain problems related to surgeries on her back, neck and left knee.  Two-and-a-half years ago she was prescribed Percocet to treat these problems, changing over to Dilaudid two years ago.  Pt reports that she never liked the Dilaudid, and in 07/2012 her dosage was reduced from 4 mg TID to 2 mg QID.  On 09/10/2012 pt had a UDS that was negative for opiates, with similar results on 10/10/2012.  Because of this her doctor treating her pain problems discontinued her prescription for Dilaudid.  Her last use was 2 mg on Thursday, 10/17/2012.  Today she is seeking guidance with how to handle withdrawal symptoms that she is currently experiencing.  She also has ongoing problems with depression, and these have been exacerbated by the death of her father 2.5 years ago, and her mother-in-law in 05/2012.  She also reports that she is the legal guardian of her uncle who is handicapped.  Because of her pain and withdrawal problems, pt has been unable to visit him recently, and this greatly upsets her.  Lethality: Suicidality: Pt denies SI currently or at any time in the past.  She has never made a suicide attempt or engaged in self mutilation.  There is no family history of mental health problems or suicide attempt. Homicidality: Pt denies HI or physical aggression toward others.  She denies having access to firearms.  She is not facing any criminal charges.  She is currently physically agitated and very anxious, but cooperative. Psychosis: Pt denies hallucinations.  Pt does not appear to be responding to internal stimuli and exhibits no delusional thought.  Pt's reality testing appears to be  intact. Substance abuse: Pt's last use of Dilaudid was 2 mg on Thursday, 10/18/2012, ending 2.5 years of daily use of opiates, as prescribed for her by her doctor.  It should be noted that pt reports that her UDS findings on 09/10/2012 and 10/10/2012 were both negative for opiates.  Pt is prescribed Ultram, Xanax, and Robaxin, but reports using all as prescribed.  She denies abusing alcohol, prescription medications, or street drugs.  Please note that pt refused to sign Consent to Release Information to Milagros Evener, MD, her outpatient psychiatrist, for coordination of care.  Pt appears to be in acute withdrawal from opiates at this time, and her COWS score is 20.  Social supports: Pt presents with her spouse, who is very supportive.  She identifies him as her primary support, but also identifies Dr. Evelene Croon as a support.  She identifies her 66 y/o son, who lives in the household, as an additional support, but does not want to burden him with her problems, and does not want him to see her as she is.  Treatment history: Pt denies any history of hospitalization for mental health or substance abuse problems.  She has never attended a 12-Step meeting.  She has been seeing Dr Evelene Croon for problems with depression, which worsened following the death of her father, for about the past three years.  Today the pt is unclear about what services she is seeking.  She does not want to be admitted for detox, she does not want to enter a Suboxone or methadone program, and she  does not think that a 12-Step based outpatient program would be appropriate or beneficial for her since she does not feel she is at risk for relapsing.  She appears to be seeking advise on what to expect regarding her withdrawal, as well as anything that she can do to mitigate symptoms.   Axis I: Opioid Dependence 304.00; Mood Disorder NOS 296.90 Axis II: Deferred 799.9 Axis III:  Past Medical History  Diagnosis Date  . Depression   . GERD (gastroesophageal  reflux disease)   . Hypothyroidism   . Pancreatic pseudocyst   . History of kidney stones   . IBS (irritable bowel syndrome)   . PONV (postoperative nausea and vomiting)   . History of pancreatitis 01-04-2011  . DDD (degenerative disc disease), cervical   . Right ureteral stone   . Muscle spasms of neck   . Cervical pain (neck)   . Right flank pain   . Nausea DUE TO PAIN FROM KIDNEY STONE  . Right knee pain   . Swelling of right knee joint    Axis IV: General medical problems and problems related to grieving Axis V: GAF = 45  Past Medical History:  Past Medical History  Diagnosis Date  . Depression   . GERD (gastroesophageal reflux disease)   . Hypothyroidism   . Pancreatic pseudocyst   . History of kidney stones   . IBS (irritable bowel syndrome)   . PONV (postoperative nausea and vomiting)   . History of pancreatitis 01-04-2011  . DDD (degenerative disc disease), cervical   . Right ureteral stone   . Muscle spasms of neck   . Cervical pain (neck)   . Right flank pain   . Nausea DUE TO PAIN FROM KIDNEY STONE  . Right knee pain   . Swelling of right knee joint     Past Surgical History  Procedure Laterality Date  . Eus  03/01/2011    Procedure: UPPER ENDOSCOPIC ULTRASOUND (EUS) LINEAR;  Surgeon: Freddy Jaksch, MD;  Location: [facility];  Service: Endoscopy;  Laterality: N/A;  . Fine needle aspiration  03/01/2011    Procedure: FINE NEEDLE ASPIRATION (FNA) LINEAR;  Surgeon: Freddy Jaksch, MD;  Location: [facility];  Service: Endoscopy;  Laterality: N/A;  . Laparoscopic cholecystectomy  01-09-2011  . Anterior lumbar fusion  11-09-2010     L5 - S1  . Robotic-assisted right salpingo-oophorectomy w/ lysis adhesions  12-21-2009    HX ENDOMETRIOSIS AND PELVIC PAIN  . Cysto/ right retrograde pyelogram/ right ureteroscopy and stent placement  09-24-2009    HX RIGHT URETERAL CALCULUS  . Diagnostic laparoscopy  1992    ENDOMETRIOSIS  . Laparoscopic assisted  vaginal hysterectomy  1997  . Ureteroscopic stone extractions      X3  . Cervical fusion  1999    C4 - 7  . Cervical spine surgery  APRIL 2011    REMOVAL SCAR TISSUE  . Tonsillectomy  AS CHILD  . Hernia repair  AS CHILD  . Knee arthroscopy  AUG 2013    RIGHT KNEE  . Cystoscopy with ureteroscopy  12/20/2011    Procedure: CYSTOSCOPY WITH URETEROSCOPY;  Surgeon: Milford Cage, MD;  Location: [facility];  Service: Urology;  Laterality: Right;  . Abdominal hysterectomy      Family History:  Family History  Problem Relation Age of Onset  . Cancer Father   . Fibromyalgia Sister     Social History:  reports that she has never smoked. She has  never used smokeless tobacco. She reports that she does not drink alcohol or use illicit drugs.  Additional Social History:  Alcohol / Drug Use Pain Medications: Dilaudid, Ultram reportedly taken as prescribed Prescriptions: Xanax, Robaxin reportedly taken as prescribed Over the Counter: Denies Longest period of sobriety (when/how long): Past 5 days Substance #1 Name of Substance 1: Percocet for 6 months, follow by Dilaudid for 2 years 1 - Age of First Use: 47 y/o 1 - Amount (size/oz): Dilauded: 4 mg TID, tapered down to 2 mg QID in 07/2012 1 - Frequency: Dilauded: 4 mg TID, tapered down to 2 mg QID in 07/2012 1 - Duration: 2 years 1 - Last Use / Amount: Thursday 10/17/2012: 2 mg  CIWA: CIWA-Ar BP: 139/91 mmHg Pulse Rate: 81 COWS: Clinical Opiate Withdrawal Scale (COWS) Resting Pulse Rate: Pulse Rate 81-100 Sweating: Beads of sweat on brow or face Restlessness: Unable to sit still for more than a few seconds Pupil Size: Pupils pinned or normal size for room light Bone or Joint Aches: Not present Runny Nose or Tearing: Nasal stuffiness or unusually moist eyes GI Upset: Multiple episodes of diarrhea or vomiting Tremor: Tremor can be felt, but not observed Yawning: No yawning Anxiety or Irritability: Patient so irritable  or anxious that participation in the assessment is difficult Gooseflesh Skin: Skin is smooth COWS Total Score: 20  Allergies:  Allergies  Allergen Reactions  . Deltasone (Prednisone) Shortness Of Breath  . Solu-Medrol (Methylprednisolone) Shortness Of Breath and Other (See Comments)    Sob, Ams  . Zolpidem Tartrate Other (See Comments)    hallucinations    Home Medications:  (Not in a hospital admission)  OB/GYN Status:  No LMP recorded. Patient has had a hysterectomy.  General Assessment Data Location of Assessment: [facility] Is this a Tele or Face-to-Face Assessment?: Face-to-Face Is this an Initial Assessment or a Re-assessment for this encounter?: Initial Assessment Living Arrangements: Spouse/significant other;Children (Spouse, 66 y/o son) Can pt return to current living arrangement?: Yes Admission Status: Voluntary Is patient capable of signing voluntary admission?: Yes Transfer from: Home Referral Source: Self/Family/Friend  Medical Screening Exam [facility] Walk-in ONLY) Medical Exam completed: Yes  [facility] Crisis Care Plan Living Arrangements: Spouse/significant other;Children (Spouse, 69 y/o son) Name of Psychiatrist: Milagros Evener, MD (Pt refused to sign Consent to Release Information) Name of Therapist: None  Education Status Is patient currently in school?: No Highest grade of school patient has completed: High school graduate, trade school  Risk to self Suicidal Ideation: No Suicidal Intent: No Is patient at risk for suicide?: No Suicidal Plan?: No Access to Means: No What has been your use of drugs/alcohol within the last 12 months?: Using Dilaudid as prescribed Previous Attempts/Gestures: No How many times?: 0 Other Self Harm Risks: Denies any history of SI or of attempts Triggers for Past Attempts: Other (Comment) (Not applicable) Intentional Self Injurious Behavior: None Family Suicide History: No Recent stressful life event(s): Other  (Comment) (Medical problems, withdrawal) Persecutory voices/beliefs?: No Depression: Yes Depression Symptoms: Tearfulness;Loss of interest in usual pleasures;Feeling angry/irritable Substance abuse history and/or treatment for substance abuse?: Yes (Using Dilaudid as prescribed) Suicide prevention information given to non-admitted patients: Yes  Risk to Others Homicidal Ideation: No Thoughts of Harm to Others: No Current Homicidal Intent: No Current Homicidal Plan: No Access to Homicidal Means: No Identified Victim: None History of harm to others?: No Assessment of Violence: None Noted Violent Behavior Description: Very anxious and agitated, but cooperative Does patient have access to weapons?:  No (Denies having firearms.) Criminal Charges Pending?: No Does patient have a court date: No  Psychosis Hallucinations: None noted Delusions: None noted  Mental Status Report Appear/Hygiene: Other (Comment) (Appropriately dressed & groomed, damp with perspiration) Eye Contact: Fair Motor Activity: Agitation (Severe psychomotor agitation) Speech: Other (Comment) (Unremarkable) Level of Consciousness: Alert Mood: Anxious;Other (Comment);Depressed (Tearful, needy) Affect: Appropriate to circumstance Anxiety Level: Severe Thought Processes: Coherent;Relevant Judgement: Unimpaired Orientation: Person;Place;Time;Situation (Time: except for day of week (off by one), date) Obsessive Compulsive Thoughts/Behaviors: None  Cognitive Functioning Concentration: Decreased Memory: Recent Intact;Remote Intact IQ: Average Insight: Fair Impulse Control: Good Appetite: Poor Weight Loss:  (Unknown) Weight Gain:  (Unknown) Sleep: No Change Total Hours of Sleep: 9 (8 - 10 hrs/night) Vegetative Symptoms: None  ADLScreening [facility] Assessment Services) Patient's cognitive ability adequate to safely complete daily activities?: Yes Patient able to express need for assistance with ADLs?:  Yes Independently performs ADLs?: Yes (appropriate for developmental age)  Prior Inpatient Therapy Prior Inpatient Therapy: No  Prior Outpatient Therapy Prior Outpatient Therapy: Yes Prior Therapy Dates: Past 3 years: Dr Evelene Croon for depression Prior Therapy Facilty/Provider(s): Pt has never participated in 12-Step  ADL Screening (condition at time of admission) Patient's cognitive ability adequate to safely complete daily activities?: Yes Is the patient deaf or have difficulty hearing?: No Does the patient have difficulty seeing, even when wearing glasses/contacts?: No Does the patient have difficulty concentrating, remembering, or making decisions?: Yes Patient able to express need for assistance with ADLs?: Yes Does the patient have difficulty dressing or bathing?: No Independently performs ADLs?: Yes (appropriate for developmental age) Does the patient have difficulty walking or climbing stairs?: No Weakness of Legs: Left Weakness of Arms/Hands: None  Home Assistive Devices/Equipment Home Assistive Devices/Equipment: Brace (specify type) (Left knee-wearing during assessment.)    Abuse/Neglect Assessment (Assessment to be complete while patient is alone) Physical Abuse: Denies Verbal Abuse: Denies Sexual Abuse: Denies Exploitation of patient/patient's resources: Denies Self-Neglect: Denies     Merchant navy officer (For Healthcare) Advance Directive: Patient does not have advance directive;Patient would not like information (Filled out but not notarized) Pre-existing out of facility DNR order (yellow form or pink MOST form): No Nutrition Screen- [facility] Patient's home diet: Regular        Disposition:  Disposition Initial Assessment Completed for this Encounter: Yes Disposition of Patient: Other dispositions Other disposition(s): To current provider Pt reviewed with Fransisca Kaufmann, NP, who also performed MSE.  It was determined that pt does not currently present a  danger to herself or others, and therefore does not require psychiatric hospitalization.  Pt was advised to follow up with Dr Evelene Croon and her other medical providers for outpatient services.  Pt and her spouse departed from [facility] at 17:27.  On Site Evaluation by:   Reviewed with Physician:  Fransisca Kaufmann, NP @ 17:00  Doylene Canning, MA Triage Specialist Raphael Gibney 10/22/2012 6:55 PM

## 2012-10-22 NOTE — H&P (Signed)
Behavioral Health Medical Screening Exam  [name] is an 47 y.o. female.  Review of Systems  Constitutional: Positive for chills, malaise/fatigue and diaphoresis.  Eyes: Negative.   Respiratory: Negative.   Cardiovascular: Negative.   Gastrointestinal: Positive for nausea and constipation.  Genitourinary: Negative.   Musculoskeletal: Positive for myalgias.  Skin: Negative.   Neurological: Positive for headaches.  Endo/Heme/Allergies: Negative.   Psychiatric/Behavioral: Negative for depression, suicidal ideas, hallucinations, memory loss and substance abuse. The patient is nervous/anxious. The patient does not have insomnia.     Physical Exam  Constitutional: She is oriented to person, place, and time. She appears well-developed and well-nourished.  HENT:  Head: Normocephalic and atraumatic.  Right Ear: External ear normal.  Left Ear: External ear normal.  Mouth/Throat: Oropharynx is clear and moist.  Eyes: Conjunctivae and EOM are normal. Pupils are equal, round, and reactive to light.  Neck: Normal range of motion. Neck supple.  Cardiovascular: Normal rate, regular rhythm, normal heart sounds and intact distal pulses.   Respiratory: Effort normal and breath sounds normal.  GI: Soft. Bowel sounds are normal.  Musculoskeletal: Normal range of motion.  Neurological: She is alert and oriented to person, place, and time. She has normal reflexes.  Skin: Skin is warm.  Patient noted to be diaphoretic.   Psychiatric: She has a normal mood and affect. Her behavior is normal. Judgment and thought content normal.    There were no vitals taken for this visit.  Recommendations: The patient reports that she is regularly taking her xanax 1 mg QID prescribed by Dr. Evelene Croon. She reports being dismissed from her pain clinic where she was prescribed Dilaudid due to having a negative drug screen and that they refused to write her any more of the medication. The patient and her husband report  that patient began to have withdrawal symptoms from opiates last Saturday. They are asking what the next step is and was encouraged to make appointment with her Primary Care Provider. Her vitals were taken and stable. Patient appeared diaphoretic but was found to be medically stable from exam. She denies any ETOH intake or that she has missed any doses of xanax. Patient wishes to not go back on opiates and find another way to manage her pain. She reports that she has been taking Ultram for recent pain control. Patient reports that her husband is very supportive and she denies feeling depressed or suicidal.  Based on my evaluation the patient does not appear to have an emergency medical condition.  Mary Starliper NP-C 10/22/2012, 5:27 PM

## 2012-10-29 ENCOUNTER — Other Ambulatory Visit: Payer: Self-pay

## 2012-10-29 ENCOUNTER — Emergency Department (HOSPITAL_COMMUNITY)
Admission: EM | Admit: 2012-10-29 | Discharge: 2012-10-29 | Disposition: A | Discharge status: Home / Self Care | Payer: BC Managed Care – PPO | Attending: Emergency Medicine | Admitting: Emergency Medicine

## 2012-10-29 ENCOUNTER — Encounter (HOSPITAL_COMMUNITY): Payer: Self-pay | Admitting: *Deleted

## 2012-10-29 DIAGNOSIS — R109 Unspecified abdominal pain: Secondary | ICD-10-CM | POA: Insufficient documentation

## 2012-10-29 DIAGNOSIS — E039 Hypothyroidism, unspecified: Secondary | ICD-10-CM | POA: Insufficient documentation

## 2012-10-29 DIAGNOSIS — Z79899 Other long term (current) drug therapy: Secondary | ICD-10-CM | POA: Insufficient documentation

## 2012-10-29 DIAGNOSIS — K219 Gastro-esophageal reflux disease without esophagitis: Secondary | ICD-10-CM | POA: Insufficient documentation

## 2012-10-29 DIAGNOSIS — Z8719 Personal history of other diseases of the digestive system: Secondary | ICD-10-CM | POA: Insufficient documentation

## 2012-10-29 DIAGNOSIS — F3289 Other specified depressive episodes: Secondary | ICD-10-CM | POA: Insufficient documentation

## 2012-10-29 DIAGNOSIS — R112 Nausea with vomiting, unspecified: Secondary | ICD-10-CM | POA: Insufficient documentation

## 2012-10-29 DIAGNOSIS — G8929 Other chronic pain: Secondary | ICD-10-CM | POA: Insufficient documentation

## 2012-10-29 DIAGNOSIS — Z8739 Personal history of other diseases of the musculoskeletal system and connective tissue: Secondary | ICD-10-CM | POA: Insufficient documentation

## 2012-10-29 DIAGNOSIS — Z87448 Personal history of other diseases of urinary system: Secondary | ICD-10-CM | POA: Insufficient documentation

## 2012-10-29 DIAGNOSIS — Z87442 Personal history of urinary calculi: Secondary | ICD-10-CM | POA: Insufficient documentation

## 2012-10-29 DIAGNOSIS — F329 Major depressive disorder, single episode, unspecified: Secondary | ICD-10-CM | POA: Insufficient documentation

## 2012-10-29 LAB — COMPREHENSIVE METABOLIC PANEL
AST: 22 U/L (ref 0–37)
Albumin: 4.1 g/dL (ref 3.5–5.2)
Chloride: 101 mEq/L (ref 96–112)
Creatinine, Ser: 1.24 mg/dL — ABNORMAL HIGH (ref 0.50–1.10)
Potassium: 4 mEq/L (ref 3.5–5.1)
Total Bilirubin: 0.3 mg/dL (ref 0.3–1.2)
Total Protein: 7 g/dL (ref 6.0–8.3)

## 2012-10-29 LAB — CBC WITH DIFFERENTIAL/PLATELET
Basophils Absolute: 0 10*3/uL (ref 0.0–0.1)
Basophils Relative: 0 % (ref 0–1)
MCHC: 33.1 g/dL (ref 30.0–36.0)
Monocytes Absolute: 0.5 10*3/uL (ref 0.1–1.0)
Neutro Abs: 4.5 10*3/uL (ref 1.7–7.7)
Neutrophils Relative %: 56 % (ref 43–77)
RDW: 15.7 % — ABNORMAL HIGH (ref 11.5–15.5)

## 2012-10-29 LAB — POCT I-STAT TROPONIN I: Troponin i, poc: 0.02 ng/mL (ref 0.00–0.08)

## 2012-10-29 MED ORDER — ONDANSETRON 4 MG PO TBDP
4.0000 mg | ORAL_TABLET | Freq: Once | ORAL | Status: AC
  Administered 2012-10-29: 4 mg via ORAL

## 2012-10-29 MED ORDER — ONDANSETRON HCL 4 MG/2ML IJ SOLN
4.0000 mg | Freq: Once | INTRAMUSCULAR | Status: AC
  Administered 2012-10-29: 4 mg via INTRAVENOUS
  Filled 2012-10-29: qty 2

## 2012-10-29 MED ORDER — FENTANYL CITRATE 0.05 MG/ML IJ SOLN
100.0000 ug | Freq: Once | INTRAMUSCULAR | Status: AC
  Administered 2012-10-29: 100 ug via INTRAVENOUS
  Filled 2012-10-29: qty 2

## 2012-10-29 MED ORDER — ONDANSETRON 4 MG PO TBDP
ORAL_TABLET | ORAL | Status: AC
  Filled 2012-10-29: qty 1

## 2012-10-29 MED ORDER — ONDANSETRON 4 MG PO TBDP: 4.0000 mg | ORAL_TABLET | Freq: Three times a day (TID) | ORAL | Status: DC | PRN

## 2012-10-29 MED ORDER — SODIUM CHLORIDE 0.9 % IV SOLN
Freq: Once | INTRAVENOUS | Status: AC
  Administered 2012-10-29: 19:00:00 via INTRAVENOUS

## 2012-10-29 MED ORDER — SODIUM CHLORIDE 0.9 % IV BOLUS (SEPSIS)
1000.0000 mL | Freq: Once | INTRAVENOUS | Status: AC
  Administered 2012-10-29: 1000 mL via INTRAVENOUS

## 2012-10-29 NOTE — Consult Note (Signed)
[facility] History and Physical  [name] XBJ:[id number] DOB: 1965/06/02    PCP:   Lupita Raider, MD   Chief Complaint: abdominal pain, nausea and vomiting.  HPI: [name] is an 47 y.o. female with hx of chronic pancreatitis, chronic pain syndrome, IBS, hypothyroidism, GERD, depression, hypothyroidism, sent to the ER as she was having more abdominal pain, having dehydration from nausea and vomiting.  She has been on Dilaudid 4mg  TID given by her pain physician, but recently was discharged because she said her urinary drug screen didn't show any presence of the narcotics.  She and her husband said she has been taken her meds as directed, however.  She was sent in by PCP for possible admission for pain control and for IV hydration.  I was asked to admit her for the above reasons.  Rewiew of Systems:  Constitutional: Negative for malaise, fever and chills. No significant weight loss or weight gain Eyes: Negative for eye pain, redness and discharge, diplopia, visual changes, or flashes of light. ENMT: Negative for ear pain, hoarseness, nasal congestion, sinus pressure and sore throat. No headaches; tinnitus, drooling, or problem swallowing. Cardiovascular: Negative for chest pain, palpitations, diaphoresis, dyspnea and peripheral edema. ; No orthopnea, PND Respiratory: Negative for cough, hemoptysis, wheezing and stridor. No pleuritic chestpain. Gastrointestinal: Negative for  diarrhea, constipation, melena, blood in stool, hematemesis, jaundice and rectal bleeding.    Genitourinary: Negative for frequency, dysuria, incontinence,flank pain and hematuria; Musculoskeletal: Negative for back pain and neck pain. Negative for swelling and trauma.;  Skin: . Negative for pruritus, rash, abrasions, bruising and skin lesion.; ulcerations Neuro: Negative for headache, lightheadedness and neck stiffness. Negative for weakness, altered level of consciousness , altered mental status,  extremity weakness, burning feet, involuntary movement, seizure and syncope.  Psych: negative for anxiety, depression, insomnia, tearfulness, panic attacks, hallucinations, paranoia, suicidal or homicidal ideation.   Past Medical History  Diagnosis Date  . Depression   . GERD (gastroesophageal reflux disease)   . Hypothyroidism   . Pancreatic pseudocyst   . History of kidney stones   . IBS (irritable bowel syndrome)   . PONV (postoperative nausea and vomiting)   . History of pancreatitis 01-04-2011  . DDD (degenerative disc disease), cervical   . Right ureteral stone   . Muscle spasms of neck   . Cervical pain (neck)   . Right flank pain   . Nausea DUE TO PAIN FROM KIDNEY STONE  . Right knee pain   . Swelling of right knee joint     Past Surgical History  Procedure Laterality Date  . Eus  03/01/2011    Procedure: UPPER ENDOSCOPIC ULTRASOUND (EUS) LINEAR;  Surgeon: Freddy Jaksch, MD;  Location: [facility];  Service: Endoscopy;  Laterality: N/A;  . Fine needle aspiration  03/01/2011    Procedure: FINE NEEDLE ASPIRATION (FNA) LINEAR;  Surgeon: Freddy Jaksch, MD;  Location: [facility];  Service: Endoscopy;  Laterality: N/A;  . Laparoscopic cholecystectomy  01-09-2011  . Anterior lumbar fusion  11-09-2010     L5 - S1  . Robotic-assisted right salpingo-oophorectomy w/ lysis adhesions  12-21-2009    HX ENDOMETRIOSIS AND PELVIC PAIN  . Cysto/ right retrograde pyelogram/ right ureteroscopy and stent placement  09-24-2009    HX RIGHT URETERAL CALCULUS  . Diagnostic laparoscopy  1992    ENDOMETRIOSIS  . Laparoscopic assisted vaginal hysterectomy  1997  . Ureteroscopic stone extractions      X3  . Cervical fusion  1999  C4 - 7  . Cervical spine surgery  APRIL 2011    REMOVAL SCAR TISSUE  . Tonsillectomy  AS CHILD  . Hernia repair  AS CHILD  . Knee arthroscopy  AUG 2013    RIGHT KNEE  . Cystoscopy with ureteroscopy  12/20/2011    Procedure: CYSTOSCOPY WITH  URETEROSCOPY;  Surgeon: Milford Cage, MD;  Location: [facility];  Service: Urology;  Laterality: Right;  . Abdominal hysterectomy    . Joint replacement      Medications:  HOME MEDS: Prior to Admission medications   Medication Sig Start Date End Date Taking? Authorizing Provider  albuterol (PROVENTIL HFA;VENTOLIN HFA) 108 (90 BASE) MCG/ACT inhaler Inhale 2 puffs into the lungs every 6 (six) hours as needed for wheezing.   Yes Historical Provider, MD  ALPRAZolam Prudy Feeler) 1 MG tablet Take 1 mg by mouth 4 (four) times daily.   Yes Historical Provider, MD  buPROPion (WELLBUTRIN XL) 300 MG 24 hr tablet Take 300 mg by mouth daily.  01/05/11  Yes Historical Provider, MD  DULoxetine (CYMBALTA) 30 MG capsule Take 30 mg by mouth 3 (three) times daily.    Yes Historical Provider, MD  fluticasone (FLONASE) 50 MCG/ACT nasal spray Place 2 sprays into the nose daily as needed for allergies.   Yes Historical Provider, MD  guaiFENesin (ROBITUSSIN) 100 MG/5ML SOLN Take 10 mLs (200 mg total) by mouth every 4 (four) hours as needed. 06/08/12  Yes Nishant Dhungel, MD  hydrochlorothiazide (HYDRODIURIL) 25 MG tablet Take 25 mg by mouth daily with breakfast.  10/30/10  Yes Historical Provider, MD  hydrocortisone-pramoxine (PROCTOFOAM-HC) rectal foam Place 1 applicator rectally 2 (two) times daily as needed for hemorrhoids.   Yes Historical Provider, MD  HYDROmorphone (DILAUDID) 4 MG tablet Take 4 mg by mouth 3 (three) times daily as needed for pain.   Yes Historical Provider, MD  levothyroxine (SYNTHROID, LEVOTHROID) 112 MCG tablet Take 112 mcg by mouth daily with breakfast.   Yes Historical Provider, MD  loratadine (CLARITIN) 10 MG tablet Take 10 mg by mouth daily.    Yes Historical Provider, MD  Meth-Hyo-M Bl-Na Phos-Ph Sal (URIBEL) 118 MG CAPS Take 118 mg by mouth 4 (four) times daily as needed. uti   Yes Historical Provider, MD  methocarbamol (ROBAXIN) 500 MG tablet Take 750 mg by mouth 3  (three) times daily.  01/02/11  Yes Historical Provider, MD  Multiple Vitamin (MULTIVITAMIN WITH MINERALS) TABS Take 1 tablet by mouth daily.   Yes Historical Provider, MD  polycarbophil (FIBERCON) 625 MG tablet Take 625 mg by mouth 3 (three) times daily.   Yes Historical Provider, MD  polyethylene glycol (MIRALAX / GLYCOLAX) packet Take 17 g by mouth 2 (two) times daily.   Yes Historical Provider, MD  Potassium Citrate (UROCIT-K 15) 15 MEQ (1620 MG) TBCR Take 1 tablet by mouth daily.    Yes Historical Provider, MD  pregabalin (LYRICA) 100 MG capsule Take 200 mg by mouth 2 (two) times daily.    Yes Historical Provider, MD  promethazine (PHENERGAN) 25 MG tablet Take 25 mg by mouth every 6 (six) hours as needed for nausea.   Yes Historical Provider, MD  ranitidine (ZANTAC) 75 MG tablet Take 75 mg by mouth 2 (two) times daily as needed for heartburn.   Yes Historical Provider, MD  Tamsulosin HCl (FLOMAX) 0.4 MG CAPS Take 0.4 mg by mouth daily as needed (for kidney pain.).    Yes Historical Provider, MD  traMADol (ULTRAM) 50 MG  tablet Take 50 mg by mouth every 6 (six) hours as needed for pain.   Yes Historical Provider, MD  azithromycin (ZITHROMAX) 250 MG tablet Daily for 4 days 06/09/12   Eddie North, MD     Allergies:  Allergies  Allergen Reactions  . Deltasone [Prednisone] Shortness Of Breath  . Solu-Medrol [Methylprednisolone] Shortness Of Breath and Other (See Comments)    Sob, Ams  . Dilaudid [Hydromorphone Hcl]   . Zolpidem Tartrate Other (See Comments)    hallucinations    Social History:   reports that she has never smoked. She has never used smokeless tobacco. She reports that she does not drink alcohol or use illicit drugs.  Family History: Family History  Problem Relation Age of Onset  . Cancer Father   . Fibromyalgia Sister      Physical Exam: Filed Vitals:   10/29/12 1915 10/29/12 1945 10/29/12 2030 10/29/12 2100  BP: 110/65 111/64 105/60 100/52  Pulse: 66 62 62  66  Temp:      TempSrc:      Resp:      SpO2: 100% 99% 99% 93%   Blood pressure 100/52, pulse 66, temperature 98.7 F (37.1 C), temperature source Oral, resp. rate 22, SpO2 93.00%.  GEN:  Pleasant patient lying in the stretcher in no acute distress; cooperative with exam. PSYCH:  alert and oriented x4; does not appear anxious or depressed; affect is appropriate. HEENT: Mucous membranes pink and anicteric; PERRLA; EOM intact; no cervical lymphadenopathy nor thyromegaly or carotid bruit; no JVD; There were no stridor. Neck is very supple. Breasts:: Not examined CHEST WALL: No tenderness CHEST: Normal respiration, clear to auscultation bilaterally.  HEART: Regular rate and rhythm.  There are no murmur, rub, or gallops.   BACK: No kyphosis or scoliosis; no CVA tenderness ABDOMEN: soft and only slightly tender.  no masses, no organomegaly, normal abdominal bowel sounds; no pannus; no intertriginous candida. There is no rebound and no distention. Rectal Exam: Not done EXTREMITIES: No bone or joint deformity; age-appropriate arthropathy of the hands and knees; no edema; no ulcerations.  There is no calf tenderness. Genitalia: not examined PULSES: 2+ and symmetric SKIN: Normal hydration no rash or ulceration CNS: Cranial nerves 2-12 grossly intact no focal lateralizing neurologic deficit.  Speech is fluent; uvula elevated with phonation, facial symmetry and tongue midline. DTR are normal bilaterally, cerebella exam is intact, barbinski is negative and strengths are equaled bilaterally.  No sensory loss.   Labs on Admission:  Basic Metabolic Panel:  Recent Labs Lab 10/29/12 1431  NA 137  K 4.0  CL 101  CO2 24  GLUCOSE 91  BUN 12  CREATININE 1.24*  CALCIUM 9.8   Liver Function Tests:  Recent Labs Lab 10/29/12 1431  AST 22  ALT 16  ALKPHOS 94  BILITOT 0.3  PROT 7.0  ALBUMIN 4.1    Recent Labs Lab 10/29/12 1431  LIPASE 26   No results found for this basename: AMMONIA,   in the last 168 hours CBC:  Recent Labs Lab 10/29/12 1431  WBC 8.0  NEUTROABS 4.5  HGB 13.5  HCT 40.8  MCV 81.4  PLT 301   Cardiac Enzymes: No results found for this basename: CKTOTAL, CKMB, CKMBINDEX, TROPONINI,  in the last 168 hours  CBG: No results found for this basename: GLUCAP,  in the last 168 hours   Radiological Exams on Admission: No results found.  Assessment/Plan Chronic pancreatitis Dehydration Chronic narcotic use Depression.  PLAN:  I was going  to admit her for IVF and pain medication as planned, but she felt markedly better and would like to go home.  I do think it is reasonable, and have asked ED provider to give her another liter of IVF.  She also had received some IV pain medication.  She will follow up with her PCP and will try to get established with another pain specialist.  I think methadone, or even duragesic patch would be better medicines for her chronic pain.  Methadone would have less respiratory suppression and less build up of tolerance than morphine or dilaudid.  Transdermal fentanyl would give her pain relief even with nausea and vomiting.   I will, of course, defer these decisions to her PCP and/or her pain clinician.  Thank you for asking me to participate in her care.  Other plans as per orders.  Code Status: FULL Unk Lightning, MD. [facility] Pager [phone] 7pm to 7am.  10/29/2012, 10:03 PM

## 2012-10-29 NOTE — ED Provider Notes (Signed)
CSN: [id number]     Arrival date & time 10/29/12  1355 History     First MD Initiated Contact with Patient 10/29/12 1847     Chief Complaint  Patient presents with  . Abdominal Pain  . Emesis   (Consider location/radiation/quality/duration/timing/severity/associated sxs/prior Treatment) Patient is a 47 y.o. female presenting with abdominal pain and vomiting. The history is provided by the patient and medical records. No language interpreter was used.  Abdominal Pain Pain location:  Epigastric, LUQ and RUQ Pain quality: gnawing and stabbing   Pain severity:  Severe Onset quality:  Gradual Duration:  2 weeks Timing:  Intermittent Progression:  Worsening Chronicity:  Chronic Context comment:  Cessation of narcotic medication 10 days ago Associated symptoms: nausea and vomiting   Associated symptoms: no fever   Emesis Associated symptoms: abdominal pain     Past Medical History  Diagnosis Date  . Depression   . GERD (gastroesophageal reflux disease)   . Hypothyroidism   . Pancreatic pseudocyst   . History of kidney stones   . IBS (irritable bowel syndrome)   . PONV (postoperative nausea and vomiting)   . History of pancreatitis 01-04-2011  . DDD (degenerative disc disease), cervical   . Right ureteral stone   . Muscle spasms of neck   . Cervical pain (neck)   . Right flank pain   . Nausea DUE TO PAIN FROM KIDNEY STONE  . Right knee pain   . Swelling of right knee joint    Past Surgical History  Procedure Laterality Date  . Eus  03/01/2011    Procedure: UPPER ENDOSCOPIC ULTRASOUND (EUS) LINEAR;  Surgeon: Freddy Jaksch, MD;  Location: [facility];  Service: Endoscopy;  Laterality: N/A;  . Fine needle aspiration  03/01/2011    Procedure: FINE NEEDLE ASPIRATION (FNA) LINEAR;  Surgeon: Freddy Jaksch, MD;  Location: [facility];  Service: Endoscopy;  Laterality: N/A;  . Laparoscopic cholecystectomy  01-09-2011  . Anterior lumbar fusion  11-09-2010     L5 - S1  .  Robotic-assisted right salpingo-oophorectomy w/ lysis adhesions  12-21-2009    HX ENDOMETRIOSIS AND PELVIC PAIN  . Cysto/ right retrograde pyelogram/ right ureteroscopy and stent placement  09-24-2009    HX RIGHT URETERAL CALCULUS  . Diagnostic laparoscopy  1992    ENDOMETRIOSIS  . Laparoscopic assisted vaginal hysterectomy  1997  . Ureteroscopic stone extractions      X3  . Cervical fusion  1999    C4 - 7  . Cervical spine surgery  APRIL 2011    REMOVAL SCAR TISSUE  . Tonsillectomy  AS CHILD  . Hernia repair  AS CHILD  . Knee arthroscopy  AUG 2013    RIGHT KNEE  . Cystoscopy with ureteroscopy  12/20/2011    Procedure: CYSTOSCOPY WITH URETEROSCOPY;  Surgeon: Milford Cage, MD;  Location: [facility];  Service: Urology;  Laterality: Right;  . Abdominal hysterectomy    . Joint replacement     Family History  Problem Relation Age of Onset  . Cancer Father   . Fibromyalgia Sister    History  Substance Use Topics  . Smoking status: Never Smoker   . Smokeless tobacco: Never Used  . Alcohol Use: No   OB History   Grav Para Term Preterm Abortions TAB SAB Ect Mult Living                 Review of Systems  Constitutional: Negative for fever.  Gastrointestinal: Positive for nausea,  vomiting and abdominal pain.  All other systems reviewed and are negative.    Allergies  Deltasone; Solu-medrol; Dilaudid; and Zolpidem tartrate  Home Medications   Current Outpatient Rx  Name  Route  Sig  Dispense  Refill  . albuterol (PROVENTIL HFA;VENTOLIN HFA) 108 (90 BASE) MCG/ACT inhaler   Inhalation   Inhale 2 puffs into the lungs every 6 (six) hours as needed for wheezing.         Marland Kitchen ALPRAZolam (XANAX) 1 MG tablet   Oral   Take 1 mg by mouth 4 (four) times daily.         Marland Kitchen buPROPion (WELLBUTRIN XL) 300 MG 24 hr tablet   Oral   Take 300 mg by mouth daily.          . DULoxetine (CYMBALTA) 30 MG capsule   Oral   Take 30 mg by mouth 3 (three) times  daily.          . fluticasone (FLONASE) 50 MCG/ACT nasal spray   Nasal   Place 2 sprays into the nose daily as needed for allergies.         Marland Kitchen guaiFENesin (ROBITUSSIN) 100 MG/5ML SOLN   Oral   Take 10 mLs (200 mg total) by mouth every 4 (four) hours as needed.   500 mL   0   . hydrochlorothiazide (HYDRODIURIL) 25 MG tablet   Oral   Take 25 mg by mouth daily with breakfast.          . hydrocortisone-pramoxine (PROCTOFOAM-HC) rectal foam   Rectal   Place 1 applicator rectally 2 (two) times daily as needed for hemorrhoids.         Marland Kitchen HYDROmorphone (DILAUDID) 4 MG tablet   Oral   Take 4 mg by mouth 3 (three) times daily as needed for pain.         Marland Kitchen levothyroxine (SYNTHROID, LEVOTHROID) 112 MCG tablet   Oral   Take 112 mcg by mouth daily with breakfast.         . loratadine (CLARITIN) 10 MG tablet   Oral   Take 10 mg by mouth daily.          . Meth-Hyo-M Bl-Na Phos-Ph Sal (URIBEL) 118 MG CAPS   Oral   Take 118 mg by mouth 4 (four) times daily as needed. uti         . methocarbamol (ROBAXIN) 500 MG tablet   Oral   Take 750 mg by mouth 3 (three) times daily.          . Multiple Vitamin (MULTIVITAMIN WITH MINERALS) TABS   Oral   Take 1 tablet by mouth daily.         . polycarbophil (FIBERCON) 625 MG tablet   Oral   Take 625 mg by mouth 3 (three) times daily.         . polyethylene glycol (MIRALAX / GLYCOLAX) packet   Oral   Take 17 g by mouth 2 (two) times daily.         . Potassium Citrate (UROCIT-K 15) 15 MEQ (1620 MG) TBCR   Oral   Take 1 tablet by mouth daily.          . pregabalin (LYRICA) 100 MG capsule   Oral   Take 200 mg by mouth 2 (two) times daily.          . promethazine (PHENERGAN) 25 MG tablet   Oral   Take 25 mg by mouth every 6 (six) hours  as needed for nausea.         . ranitidine (ZANTAC) 75 MG tablet   Oral   Take 75 mg by mouth 2 (two) times daily as needed for heartburn.         . Tamsulosin HCl (FLOMAX)  0.4 MG CAPS   Oral   Take 0.4 mg by mouth daily as needed (for kidney pain.).          Marland Kitchen traMADol (ULTRAM) 50 MG tablet   Oral   Take 50 mg by mouth every 6 (six) hours as needed for pain.         Marland Kitchen azithromycin (ZITHROMAX) 250 MG tablet      Daily for 4 days   4 each   0    BP 100/52  Pulse 66  Temp(Src) 98.7 F (37.1 C) (Oral)  Resp 22  SpO2 93% Physical Exam  Constitutional: She is oriented to person, place, and time. She appears well-developed and well-nourished.  HENT:  Head: Normocephalic.  Eyes: Pupils are equal, round, and reactive to light.  Neck: Normal range of motion.  Cardiovascular: Normal rate and regular rhythm.   Pulmonary/Chest: Effort normal and breath sounds normal.  Abdominal: Soft. There is tenderness. There is no rebound.  Musculoskeletal: Normal range of motion.  Neurological: She is alert and oriented to person, place, and time.  Skin: Skin is warm and dry.  Psychiatric: She has a normal mood and affect. Her behavior is normal. Judgment and thought content normal.    ED Course   Procedures (including critical care time)  Labs Reviewed  CBC WITH DIFFERENTIAL - Abnormal; Notable for the following:    RDW 15.7 (*)    All other components within normal limits  COMPREHENSIVE METABOLIC PANEL - Abnormal; Notable for the following:    Creatinine, Ser 1.24 (*)    GFR calc non Af Amer 51 (*)    GFR calc Af Amer 59 (*)    All other components within normal limits  LIPASE, BLOOD  URINALYSIS, ROUTINE W REFLEX MICROSCOPIC  POCT I-STAT TROPONIN I   No results found. 1. Abdominal pain, chronic, generalized    Patient seen in office by PCP today with abdominal pain, persistent nausea and vomiting.  Note from Dr. Clelia Croft suggests patient may need admission.  History of chronic pancreatitis.  Recent cessation of narcotic pain medication.  Unsure if symptoms are d/t pancreatitis flare or possible opiod withdrawal.  Labs unremarkable except for mildly  elevated creatinine. Patient seen by hospitalist--patient will receive additional liter of LV fluids and then discharged home with PCP follow-up. MDM    Jimmye Norman, NP 10/30/12 0001

## 2012-10-29 NOTE — ED Notes (Signed)
Pt has not had any pain medication for her chronic pancreatitis for 12 days.  Pt now with severe abdominal pain, vomiting, and epigastric pain, and diaphoretic

## 2012-10-29 NOTE — ED Notes (Signed)
PT continues to dry heave and vomit.

## 2012-10-30 NOTE — ED Provider Notes (Signed)
Medical screening examination/treatment/procedure(s) were conducted as a shared visit with non-physician practitioner(s) or resident  and myself.  I personally evaluated the patient during the encounter and agree with the findings and plan unless otherwise indicated.    Chronic abd pain.  Pt states central.  Similar to previous for 2.5 yrs.  No fevers.  Pt trying to wean off pain meds 10 days ago.  Pain gradually worsening.  Abd diffuse mild pain, no guarding or rigidity, pt anxious, soft.  Mild dry mm.  Plan for fluids, pain meds and fup outpt.  Hospitalist agreed outpt fup and pt prefers outpatient at this time.    Enid Skeens, MD 10/30/12 1212

## 2013-01-16 ENCOUNTER — Emergency Department (HOSPITAL_COMMUNITY)
Admission: EM | Admit: 2013-01-16 | Discharge: 2013-01-16 | Disposition: A | Discharge status: Home / Self Care | Payer: BC Managed Care – PPO | Attending: Emergency Medicine | Admitting: Emergency Medicine

## 2013-01-16 ENCOUNTER — Emergency Department (HOSPITAL_COMMUNITY): Payer: BC Managed Care – PPO

## 2013-01-16 ENCOUNTER — Encounter (HOSPITAL_COMMUNITY): Payer: Self-pay | Admitting: Emergency Medicine

## 2013-01-16 DIAGNOSIS — F329 Major depressive disorder, single episode, unspecified: Secondary | ICD-10-CM | POA: Insufficient documentation

## 2013-01-16 DIAGNOSIS — Y9389 Activity, other specified: Secondary | ICD-10-CM | POA: Insufficient documentation

## 2013-01-16 DIAGNOSIS — Z792 Long term (current) use of antibiotics: Secondary | ICD-10-CM | POA: Insufficient documentation

## 2013-01-16 DIAGNOSIS — Z8739 Personal history of other diseases of the musculoskeletal system and connective tissue: Secondary | ICD-10-CM | POA: Insufficient documentation

## 2013-01-16 DIAGNOSIS — K219 Gastro-esophageal reflux disease without esophagitis: Secondary | ICD-10-CM | POA: Insufficient documentation

## 2013-01-16 DIAGNOSIS — Z79899 Other long term (current) drug therapy: Secondary | ICD-10-CM | POA: Insufficient documentation

## 2013-01-16 DIAGNOSIS — E039 Hypothyroidism, unspecified: Secondary | ICD-10-CM | POA: Insufficient documentation

## 2013-01-16 DIAGNOSIS — Y9289 Other specified places as the place of occurrence of the external cause: Secondary | ICD-10-CM | POA: Insufficient documentation

## 2013-01-16 DIAGNOSIS — Z87442 Personal history of urinary calculi: Secondary | ICD-10-CM | POA: Insufficient documentation

## 2013-01-16 DIAGNOSIS — IMO0002 Reserved for concepts with insufficient information to code with codable children: Secondary | ICD-10-CM | POA: Insufficient documentation

## 2013-01-16 DIAGNOSIS — Z8742 Personal history of other diseases of the female genital tract: Secondary | ICD-10-CM | POA: Insufficient documentation

## 2013-01-16 DIAGNOSIS — S43401A Unspecified sprain of right shoulder joint, initial encounter: Secondary | ICD-10-CM

## 2013-01-16 DIAGNOSIS — F3289 Other specified depressive episodes: Secondary | ICD-10-CM | POA: Insufficient documentation

## 2013-01-16 DIAGNOSIS — W230XXA Caught, crushed, jammed, or pinched between moving objects, initial encounter: Secondary | ICD-10-CM | POA: Insufficient documentation

## 2013-01-16 MED ORDER — HYDROCODONE-ACETAMINOPHEN 5-325 MG PO TABS
2.0000 | ORAL_TABLET | Freq: Once | ORAL | Status: AC
  Administered 2013-01-16: 2 via ORAL
  Filled 2013-01-16: qty 2

## 2013-01-16 MED ORDER — ALBUTEROL SULFATE (5 MG/ML) 0.5% IN NEBU
5.0000 mg | INHALATION_SOLUTION | Freq: Once | RESPIRATORY_TRACT | Status: AC
  Administered 2013-01-16: 5 mg via RESPIRATORY_TRACT
  Filled 2013-01-16: qty 1

## 2013-01-16 MED ORDER — IPRATROPIUM BROMIDE 0.02 % IN SOLN
0.5000 mg | Freq: Once | RESPIRATORY_TRACT | Status: AC
  Administered 2013-01-16: 0.5 mg via RESPIRATORY_TRACT
  Filled 2013-01-16: qty 2.5

## 2013-01-16 NOTE — ED Provider Notes (Addendum)
CSN: [id number]     Arrival date & time 01/16/13  0446 History   First MD Initiated Contact with Patient 01/16/13 0516     Chief Complaint  Patient presents with  . Fall   (Consider location/radiation/quality/duration/timing/severity/associated sxs/prior Treatment) Patient is a 47 y.o. female presenting with fall. The history is provided by the patient.  Fall Pertinent negatives include no headaches and no shortness of breath.  pt indicates had gotten up from bed to bathroom. Was trying to stand from toilet, to use walker to go back to bed, when right shoulder/upper arm got stuck/caught between walker and wall in awkward position. C/o pain right clavicle and shoulder area. Constant. Dull. Moderate-sev. Worse w movement and palpation. No radicular pain. Did not fall to ground. No head injury or headache. No neck pain. No numbness/weakness. No cp or sob. Denies other injury.      Past Medical History  Diagnosis Date  . Depression   . GERD (gastroesophageal reflux disease)   . Hypothyroidism   . Pancreatic pseudocyst   . History of kidney stones   . IBS (irritable bowel syndrome)   . PONV (postoperative nausea and vomiting)   . History of pancreatitis 01-04-2011  . DDD (degenerative disc disease), cervical   . Right ureteral stone   . Muscle spasms of neck   . Cervical pain (neck)   . Right flank pain   . Nausea DUE TO PAIN FROM KIDNEY STONE  . Right knee pain   . Swelling of right knee joint    Past Surgical History  Procedure Laterality Date  . Eus  03/01/2011    Procedure: UPPER ENDOSCOPIC ULTRASOUND (EUS) LINEAR;  Surgeon: Freddy Jaksch, MD;  Location: [facility];  Service: Endoscopy;  Laterality: N/A;  . Fine needle aspiration  03/01/2011    Procedure: FINE NEEDLE ASPIRATION (FNA) LINEAR;  Surgeon: Freddy Jaksch, MD;  Location: [facility];  Service: Endoscopy;  Laterality: N/A;  . Laparoscopic cholecystectomy  01-09-2011  . Anterior lumbar fusion  11-09-2010    L5 - S1  . Robotic-assisted right salpingo-oophorectomy w/ lysis adhesions  12-21-2009    HX ENDOMETRIOSIS AND PELVIC PAIN  . Cysto/ right retrograde pyelogram/ right ureteroscopy and stent placement  09-24-2009    HX RIGHT URETERAL CALCULUS  . Diagnostic laparoscopy  1992    ENDOMETRIOSIS  . Laparoscopic assisted vaginal hysterectomy  1997  . Ureteroscopic stone extractions      X3  . Cervical fusion  1999    C4 - 7  . Cervical spine surgery  APRIL 2011    REMOVAL SCAR TISSUE  . Tonsillectomy  AS CHILD  . Hernia repair  AS CHILD  . Knee arthroscopy  AUG 2013    RIGHT KNEE  . Cystoscopy with ureteroscopy  12/20/2011    Procedure: CYSTOSCOPY WITH URETEROSCOPY;  Surgeon: Milford Cage, MD;  Location: [facility];  Service: Urology;  Laterality: Right;  . Abdominal hysterectomy    . Joint replacement     Family History  Problem Relation Age of Onset  . Cancer Father   . Fibromyalgia Sister    History  Substance Use Topics  . Smoking status: Never Smoker   . Smokeless tobacco: Never Used  . Alcohol Use: No   OB History   Grav Para Term Preterm Abortions TAB SAB Ect Mult Living                 Review of Systems  Constitutional: Negative for fever.  Respiratory: Negative for shortness of breath.   Musculoskeletal: Negative for back pain and neck pain.  Skin: Negative for wound.  Neurological: Negative for weakness, numbness and headaches.    Allergies  Deltasone; Solu-medrol; Dilaudid; and Zolpidem tartrate  Home Medications   Current Outpatient Rx  Name  Route  Sig  Dispense  Refill  . albuterol (PROVENTIL HFA;VENTOLIN HFA) 108 (90 BASE) MCG/ACT inhaler   Inhalation   Inhale 2 puffs into the lungs every 6 (six) hours as needed for wheezing.         Marland Kitchen ALPRAZolam (XANAX) 1 MG tablet   Oral   Take 1 mg by mouth 4 (four) times daily.         Marland Kitchen amoxicillin-clavulanate (AUGMENTIN) 875-125 MG per tablet   Oral   Take 1 tablet by mouth 2  (two) times daily.         Marland Kitchen buPROPion (WELLBUTRIN XL) 300 MG 24 hr tablet   Oral   Take 300 mg by mouth daily.          . DULoxetine (CYMBALTA) 30 MG capsule   Oral   Take 30 mg by mouth 3 (three) times daily.          . fentaNYL (DURAGESIC - DOSED MCG/HR) 25 MCG/HR patch   Transdermal   Place 25 mcg onto the skin every 3 (three) days.         . fluticasone (FLONASE) 50 MCG/ACT nasal spray   Nasal   Place 2 sprays into the nose daily as needed for allergies.         . hydrochlorothiazide (HYDRODIURIL) 25 MG tablet   Oral   Take 25 mg by mouth daily with breakfast.          . HYDROcodone-homatropine (HYCODAN) 5-1.5 MG/5ML syrup   Oral   Take 5 mLs by mouth every 4 (four) hours as needed for cough.          . hydrocortisone-pramoxine (PROCTOFOAM-HC) rectal foam   Rectal   Place 1 applicator rectally 2 (two) times daily as needed for hemorrhoids.         Marland Kitchen ipratropium-albuterol (DUONEB) 0.5-2.5 (3) MG/3ML SOLN   Inhalation   Inhale 3 mLs into the lungs every 2 (two) hours as needed.          Marland Kitchen levothyroxine (SYNTHROID, LEVOTHROID) 112 MCG tablet   Oral   Take 112 mcg by mouth daily with breakfast.         . Meth-Hyo-M Bl-Na Phos-Ph Sal (URIBEL) 118 MG CAPS   Oral   Take 118 mg by mouth 4 (four) times daily as needed. uti         . methocarbamol (ROBAXIN) 500 MG tablet   Oral   Take 750 mg by mouth 3 (three) times daily.          . Multiple Vitamin (MULTIVITAMIN WITH MINERALS) TABS   Oral   Take 1 tablet by mouth daily.         Marland Kitchen oxyCODONE-acetaminophen (PERCOCET/ROXICET) 5-325 MG per tablet   Oral   Take 1 tablet by mouth every 8 (eight) hours as needed for severe pain.         . polycarbophil (FIBERCON) 625 MG tablet   Oral   Take 625 mg by mouth 3 (three) times daily.         . polyethylene glycol (MIRALAX / GLYCOLAX) packet   Oral   Take 17 g by mouth 2 (two) times daily.         Marland Kitchen  Potassium Citrate (UROCIT-K 15) 15 MEQ  (1620 MG) TBCR   Oral   Take 1 tablet by mouth daily.          . pregabalin (LYRICA) 100 MG capsule   Oral   Take 200 mg by mouth 2 (two) times daily.          . promethazine (PHENERGAN) 25 MG tablet   Oral   Take 25 mg by mouth every 6 (six) hours as needed for nausea.         . ranitidine (ZANTAC) 75 MG tablet   Oral   Take 75 mg by mouth 2 (two) times daily as needed for heartburn.         . Tamsulosin HCl (FLOMAX) 0.4 MG CAPS   Oral   Take 0.4 mg by mouth daily as needed (for kidney pain.).           BP 128/59  Pulse 94  Temp(Src) 98.2 F (36.8 C) (Oral)  Resp 18  Ht 5\' 4"  (1.626 m)  Wt 210 lb (95.255 kg)  BMI 36.03 kg/m2  SpO2 98% Physical Exam  Nursing note and vitals reviewed. Constitutional: She is oriented to person, place, and time. She appears well-developed and well-nourished. No distress.  HENT:  Head: Atraumatic.  Eyes: Conjunctivae are normal. No scleral icterus.  Neck: Normal range of motion. Neck supple. No tracheal deviation present.  Cardiovascular: Normal rate.   Pulmonary/Chest: Effort normal and breath sounds normal. No respiratory distress.  Right clavicle tenderness. No crepitus to chest wall. Normal movement.   Abdominal: Soft. Normal appearance. She exhibits no distension. There is no tenderness.  Musculoskeletal: She exhibits no edema.  Tenderness right shoulder. No deformity. Distal pulses palp. c spine non tender. Good rom bil ext, no other focal bony tenderness. Healing arthroscopy incisions knee, no sign of infection.   Neurological: She is alert and oriented to person, place, and time.  Motor intact bil.   Skin: Skin is warm and dry. No rash noted. She is not diaphoretic.  Psychiatric: She has a normal mood and affect.    ED Course  Procedures (including critical care time) Dg Clavicle Right  01/16/2013   CLINICAL DATA:  History of a fall this morning complaining of right shoulder and clavicle pain. Limited range of motion.   EXAM: RIGHT CLAVICLE - 2+ VIEWS  COMPARISON:  No priors.  FINDINGS: Two views of the right clavicle demonstrate no acute displaced fracture. Degenerative changes of moderate osteoarthritis are noted at the acromioclavicular joint. Visualized portions the right hemithorax are grossly intact. Orthopedic fixation hardware in the lower cervical spine incidentally noted.  IMPRESSION: 1. No acute radiographic abnormality of the right clavicle. 2. 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   Electronically Signed   By: Trudie Reed M.D.   On: 01/16/2013 05:54   Dg Shoulder Right  01/16/2013   CLINICAL DATA:  History of fall complaining of right-sided shoulder pain.  EXAM: RIGHT SHOULDER - 2+ VIEW  COMPARISON:  No priors.  FINDINGS: Two views the right shoulder demonstrate no acute displaced fracture, subluxation or dislocation. Joint space narrowing, subchondral sclerosis, subchondral cyst formation osteophyte formation at the acromioclavicular joint are noted, compatible with osteoarthritis. Orthopedic fixation hardware in the lower cervical spine.  IMPRESSION: 1. No acute radiographic abnormality the right shoulder. 2. 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   Electronically Signed   By: Trudie Reed M.D.   On: 01/16/2013 05:55      MDM  vicodin po.  Xr.  Reviewed nursing notes and prior charts for additional history.   xr neg acute. Recheck pt comfortable.   Pt notes recent dx bronchitis, just started on abx, albuterol and cough med. Non prod cough in ed. No fever. Pulse ox 98% room air. Mild wheeze on recheck, states due for neb rx - albuterol and atrovent neb.     Suzi Roots, MD 01/16/13 [phone]

## 2013-01-16 NOTE — ED Notes (Signed)
Pt c/o losing balance and lowering to the floor; c/o right shoulder and right elbow pain

## 2013-01-17 ENCOUNTER — Institutional Professional Consult (permissible substitution): Payer: Self-pay | Admitting: Internal Medicine

## 2013-01-30 ENCOUNTER — Encounter: Payer: Self-pay | Admitting: Internal Medicine

## 2013-01-30 ENCOUNTER — Ambulatory Visit (INDEPENDENT_AMBULATORY_CARE_PROVIDER_SITE_OTHER): Payer: BC Managed Care – PPO | Admitting: Internal Medicine

## 2013-01-30 VITALS — BP 110/60 | HR 108 | Temp 99.5°F | Ht 64.5 in | Wt 241.0 lb

## 2013-01-30 DIAGNOSIS — R05 Cough: Secondary | ICD-10-CM

## 2013-01-30 MED ORDER — CEFDINIR 300 MG PO CAPS: 300.0000 mg | ORAL_CAPSULE | Freq: Two times a day (BID) | ORAL | Status: DC

## 2013-01-30 MED ORDER — FLUTICASONE PROPIONATE 50 MCG/ACT NA SUSP: 2.0000 | Freq: Every day | NASAL | Status: DC

## 2013-01-30 NOTE — Patient Instructions (Signed)
Cough is from sinus drainage, posible  acid reflux, and post bronchitis All of this is working together to cause cyclical cough/LPR cough  #Sinus drainage  - start hypertonic nasal saline spray at night, 2 sprays each night - start  take generic fluticasone inhaler 2 squirts each nostril daily  #Possible Acid Reflux  - continue prilosec and pepcid as before  #Possible Asthma  - for now continue albuterol as needed but do not exceed 4 times a day  #Acute bronchitis  - because you go levaquin and amox in recent weeks you cannot get that now  - try omnicef 300mg  twice daily x 5  Days   #Cyclical cough  - please choose 2-3 days and observe complete voice rest - no talking or whispering  - at all times there  there is urge to cough, drink water or swallow or sip on throat lozenge - we cannot do neurontin because you are on lyrica, opioids and duloxetine - refer neuro rehab Mr Verdie Mosher   #Followup - I or my NP tammy will see you in 3-4 weeks.  - any problems call or come sooner

## 2013-01-30 NOTE — Progress Notes (Signed)
Subjective:    Patient ID: [name], female    DOB: [birth year], 47 y.o.   MRN: [id number] PCP Lupita Raider, MD  HPI  IOV 01/30/2013   Chief Complaint  Patient presents with  . Advice Only    Pt c/o recurrent bronchitis.  Reffered by [facility].   47 year old female with multiple medical problems including obesity, chronic pain on opioids/lyrica/duloexetine,  Daugther of my former patient, now deceased, Leona Carry (copd, lung cancer(. Accompanied by husband. Very anxious in office. PErioidc sobbing. Wants door open. REports "allergy" to IV steroids (put her in coma) but has taken prednisone in past without problems    Rpeoritng chronic cough with recurrent episodes of bronchhits for few to several months. Appears each time afte rknee procedure she gets sick. Most recently had Rt knee procedure and then acute bronchitis. Not resolved with 1-2 courses of antibiotics. Severe cough. Points to throat. Mostly quality is dry. No clear cut aggravating or relieving factors. rSI cough score is 33 and detailed below and affects quality of life. They are very worried about sinister problems causing cough. In March 2014 Had admission for cough and hypoxemia through to be due to too much benzo and narcs      Dr Gretta Cool Reflux Symptom Index (> 13-15 suggestive of LPR cough) 0 -> 5  =  none ->severe problem  Hoarseness of problem with voice 1  Clearing  Of Throat 5  Excess throat mucus or feeling of post nasal drip 5  Difficulty swallowing food, liquid or tablets 3  Cough after eating or lying down 1  Breathing difficulties or choking episodes 5  Troublesome or annoying cough 5  Sensation of something sticking in throat or lump in throat 5  Heartburn, chest pain, indigestion, or stomach acid coming up 3  TOTAL 33      IMAGING - June 2013: ABd Ct lung cut - No ILD  - March 2014 CXR - clear   Past Medical History  Diagnosis Date  . Depression   . GERD  (gastroesophageal reflux disease)   . Hypothyroidism   . Pancreatic pseudocyst   . History of kidney stones   . IBS (irritable bowel syndrome)   . PONV (postoperative nausea and vomiting)   . History of pancreatitis 01-04-2011  . DDD (degenerative disc disease), cervical   . Right ureteral stone   . Muscle spasms of neck   . Cervical pain (neck)   . Right flank pain   . Nausea DUE TO PAIN FROM KIDNEY STONE  . Right knee pain   . Swelling of right knee joint      Family History  Problem Relation Age of Onset  . Cancer Father   . Fibromyalgia Sister      History   Social History  . Marital Status: Married    Spouse Name: Casimiro Needle    Number of Children: N/A  . Years of Education: N/A   Occupational History  . Unemployed    Social History Main Topics  . Smoking status: Never Smoker   . Smokeless tobacco: Never Used  . Alcohol Use: No  . Drug Use: No  . Sexual Activity: No   Other Topics Concern  . Not on file   Social History Narrative   Married.  Lives in [place].  Uses a wheelchair since knee surgery 5 months ago.       Allergies  Allergen Reactions  . Deltasone [Prednisone] Shortness Of Breath  .  Solu-Medrol [Methylprednisolone] Shortness Of Breath and Other (See Comments)    Sob, Ams  . Dilaudid [Hydromorphone Hcl]     Hallucinations  . Zolpidem Tartrate Other (See Comments)    hallucinations     Outpatient Prescriptions Prior to Visit  Medication Sig Dispense Refill  . albuterol (PROVENTIL HFA;VENTOLIN HFA) 108 (90 BASE) MCG/ACT inhaler Inhale 2 puffs into the lungs every 6 (six) hours as needed for wheezing.      Marland Kitchen ALPRAZolam (XANAX) 1 MG tablet Take 1 mg by mouth 4 (four) times daily.      Marland Kitchen buPROPion (WELLBUTRIN XL) 300 MG 24 hr tablet Take 300 mg by mouth daily.       . DULoxetine (CYMBALTA) 30 MG capsule Take 30 mg by mouth 3 (three) times daily.       . fentaNYL (DURAGESIC - DOSED MCG/HR) 25 MCG/HR patch Place 50 mcg onto the skin every 3  (three) days.       . fluticasone (FLONASE) 50 MCG/ACT nasal spray Place 2 sprays into the nose daily as needed for allergies.      . hydrochlorothiazide (HYDRODIURIL) 25 MG tablet Take 25 mg by mouth daily with breakfast.       . hydrocortisone-pramoxine (PROCTOFOAM-HC) rectal foam Place 1 applicator rectally 2 (two) times daily as needed for hemorrhoids.      Marland Kitchen ipratropium-albuterol (DUONEB) 0.5-2.5 (3) MG/3ML SOLN Inhale 3 mLs into the lungs every 2 (two) hours as needed.       Marland Kitchen levothyroxine (SYNTHROID, LEVOTHROID) 112 MCG tablet Take 112 mcg by mouth daily with breakfast.      . Meth-Hyo-M Bl-Na Phos-Ph Sal (URIBEL) 118 MG CAPS Take 118 mg by mouth 4 (four) times daily as needed. uti      . methocarbamol (ROBAXIN) 500 MG tablet Take 750 mg by mouth 3 (three) times daily.       . Multiple Vitamin (MULTIVITAMIN WITH MINERALS) TABS Take 1 tablet by mouth daily.      Marland Kitchen oxyCODONE-acetaminophen (PERCOCET/ROXICET) 5-325 MG per tablet Take 2 tablets by mouth every 8 (eight) hours as needed for severe pain.       . polycarbophil (FIBERCON) 625 MG tablet Take 625 mg by mouth 3 (three) times daily.      . polyethylene glycol (MIRALAX / GLYCOLAX) packet Take 17 g by mouth 2 (two) times daily.      . Potassium Citrate (UROCIT-K 15) 15 MEQ (1620 MG) TBCR Take 1 tablet by mouth daily.       . pregabalin (LYRICA) 100 MG capsule Take 200 mg by mouth 2 (two) times daily.       . promethazine (PHENERGAN) 25 MG tablet Take 25 mg by mouth every 6 (six) hours as needed for nausea.      . ranitidine (ZANTAC) 75 MG tablet Take 75 mg by mouth 2 (two) times daily as needed for heartburn.      . Tamsulosin HCl (FLOMAX) 0.4 MG CAPS Take 0.4 mg by mouth daily as needed (for kidney pain.).       Marland Kitchen amoxicillin-clavulanate (AUGMENTIN) 875-125 MG per tablet Take 1 tablet by mouth 2 (two) times daily.      Marland Kitchen HYDROcodone-homatropine (HYCODAN) 5-1.5 MG/5ML syrup Take 5 mLs by mouth every 4 (four) hours as needed for cough.         No facility-administered medications prior to visit.      Review of Systems  Constitutional: Positive for fever, fatigue and unexpected weight change.  HENT: Positive  for congestion, ear pain and sore throat. Negative for dental problem, nosebleeds, postnasal drip, rhinorrhea, sinus pressure, sneezing and trouble swallowing.   Eyes: Negative for redness and itching.  Respiratory: Positive for cough, chest tightness, shortness of breath and wheezing.   Cardiovascular: Positive for leg swelling. Negative for palpitations.  Gastrointestinal: Negative for nausea and vomiting.  Genitourinary: Positive for dysuria.  Musculoskeletal: Positive for joint swelling.  Skin: Negative for rash.  Neurological: Positive for headaches.  Hematological: Does not bruise/bleed easily.  Psychiatric/Behavioral: Negative for dysphoric mood. The patient is not nervous/anxious.        Objective:   Physical Exam  Vitals reviewed. Constitutional: She is oriented to person, place, and time. She appears well-developed and well-nourished. No distress.  Body mass index is 40.74 kg/(m^2). Coughing a lot Laryngeal quality   HENT:  Head: Normocephalic and atraumatic.  Right Ear: External ear normal.  Left Ear: External ear normal.  Mouth/Throat: Oropharynx is clear and moist. No oropharyngeal exudate.  Eyes: Conjunctivae and EOM are normal. Pupils are equal, round, and reactive to light. Right eye exhibits no discharge. Left eye exhibits no discharge. No scleral icterus.  Neck: Normal range of motion. Neck supple. No JVD present. No tracheal deviation present. No thyromegaly present.  Cardiovascular: Normal rate, regular rhythm, normal heart sounds and intact distal pulses.  Exam reveals no gallop and no friction rub.   No murmur heard. Pulmonary/Chest: Effort normal and breath sounds normal. No respiratory distress. She has no wheezes. She has no rales. She exhibits no tenderness.  Abdominal: Soft. Bowel  sounds are normal. She exhibits no distension and no mass. There is no tenderness. There is no rebound and no guarding.  Musculoskeletal: Normal range of motion. She exhibits no edema and no tenderness.  Knees in ace wrap  Lymphadenopathy:    She has no cervical adenopathy.  Neurological: She is alert and oriented to person, place, and time. She has normal reflexes. No cranial nerve deficit. She exhibits normal muscle tone. Coordination normal.  Skin: Skin is warm and dry. No rash noted. She is not diaphoretic. No erythema. No pallor.  Psychiatric: Judgment and thought content normal.  Wants door open Periodically tearful Husband seems to dominate Very anxious Admits to stress          Assessment & Plan:

## 2013-02-02 DIAGNOSIS — R05 Cough: Secondary | ICD-10-CM | POA: Insufficient documentation

## 2013-02-02 NOTE — Assessment & Plan Note (Signed)
Cough is from sinus drainage, posible  acid reflux, and post bronchitis All of this is working together to cause cyclical cough/LPR cough  #Sinus drainage  - start hypertonic nasal saline spray at night, 2 sprays each night - start  take generic fluticasone inhaler 2 squirts each nostril daily  #Possible Acid Reflux  - continue prilosec and pepcid as before  #Possible Asthma  - for now continue albuterol as needed but do not exceed 4 times a day  #Acute bronchitis  - because you go levaquin and amox in recent weeks you cannot get that now  - try omnicef 300mg twice daily x 5  Days   #Cyclical cough  - please choose 2-3 days and observe complete voice rest - no talking or whispering  - at all times there  there is urge to cough, drink water or swallow or sip on throat lozenge - we cannot do neurontin because you are on lyrica, opioids and duloxetine - refer neuro rehab Mr Carl Schinke   #Followup - I or my NP tammy will see you in 3-4 weeks.  - any problems call or come sooner  

## 2013-02-03 ENCOUNTER — Ambulatory Visit: Payer: BC Managed Care – PPO | Attending: Internal Medicine

## 2013-02-03 DIAGNOSIS — R498 Other voice and resonance disorders: Secondary | ICD-10-CM | POA: Insufficient documentation

## 2013-02-03 DIAGNOSIS — IMO0001 Reserved for inherently not codable concepts without codable children: Secondary | ICD-10-CM | POA: Insufficient documentation

## 2013-02-05 ENCOUNTER — Ambulatory Visit: Payer: BC Managed Care – PPO

## 2013-02-14 ENCOUNTER — Ambulatory Visit: Payer: BC Managed Care – PPO | Attending: Internal Medicine

## 2013-02-14 DIAGNOSIS — M545 Low back pain, unspecified: Secondary | ICD-10-CM | POA: Insufficient documentation

## 2013-02-14 DIAGNOSIS — IMO0001 Reserved for inherently not codable concepts without codable children: Secondary | ICD-10-CM | POA: Insufficient documentation

## 2013-02-18 ENCOUNTER — Ambulatory Visit: Payer: BC Managed Care – PPO | Admitting: Speech Pathology

## 2013-02-20 ENCOUNTER — Ambulatory Visit: Payer: Self-pay | Admitting: Internal Medicine

## 2013-02-21 ENCOUNTER — Ambulatory Visit: Payer: BC Managed Care – PPO

## 2013-02-25 ENCOUNTER — Ambulatory Visit: Payer: BC Managed Care – PPO | Admitting: Speech Pathology

## 2013-02-27 ENCOUNTER — Ambulatory Visit: Payer: BC Managed Care – PPO

## 2013-03-03 ENCOUNTER — Encounter: Payer: Self-pay | Admitting: Internal Medicine

## 2013-03-03 ENCOUNTER — Ambulatory Visit (INDEPENDENT_AMBULATORY_CARE_PROVIDER_SITE_OTHER): Payer: BC Managed Care – PPO | Admitting: Internal Medicine

## 2013-03-03 VITALS — BP 122/80 | HR 81 | Ht 64.5 in | Wt 223.0 lb

## 2013-03-03 DIAGNOSIS — R05 Cough: Secondary | ICD-10-CM

## 2013-03-03 NOTE — Assessment & Plan Note (Signed)
Cough is from sinus drainage, posible  acid reflux, and post bronchitis All of this is working together to cause cyclical cough/LPR cough Glad you are over 50% better after following my instructioins  #Sinus drainage  - continue hypertonic nasal saline spray at night, 2 sprays each night - contnue  take generic fluticasone inhaler 2 squirts each nostril daily  #Possible Acid Reflux  - continue prilosec and pepcid as before  #Possible Asthma  - for now continue albuterol as needed but do not exceed 4 times a day - start QVAR , 2 puff twice daily    #Cyclical cough  -  - at all times there  there is urge to cough, drink water or swallow or sip on throat lozenge - we cannot do neurontin because you are on lyrica, opioids and duloxetine - follow neuro rehab Mr Verdie Mosher advice at all times   #Followup -REturn to see Tammy my NP in 4 weeks for med calendar and cough score

## 2013-03-03 NOTE — Patient Instructions (Signed)
Cough is from sinus drainage, posible  acid reflux, and post bronchitis All of this is working together to cause cyclical cough/LPR cough Glad you are over 50% better after following my instructioins  #Sinus drainage  - continue hypertonic nasal saline spray at night, 2 sprays each night - contnue  take generic fluticasone inhaler 2 squirts each nostril daily  #Possible Acid Reflux  - continue prilosec and pepcid as before  #Possible Asthma  - for now continue albuterol as needed but do not exceed 4 times a day - start QVAR 80mcg, 2 puff twice daily    #Cyclical cough  -  - at all times there  there is urge to cough, drink water or swallow or sip on throat lozenge - we cannot do neurontin because you are on lyrica, opioids and duloxetine - follow neuro rehab Mr [name] advice at all times   #Followup -REturn to see Tammy my NP in 4 weeks for med calendar and cough score 

## 2013-03-03 NOTE — Progress Notes (Signed)
Subjective:    Patient ID: [name], female    DOB: [birth year], 47 y.o.   MRN: [id number]  HPI  IOV 01/30/2013   Chief Complaint  Patient presents with  . Advice Only    Pt c/o recurrent bronchitis.  Reffered by [facility].   47 year old female with multiple medical problems including obesity, chronic pain on opioids/lyrica/duloexetine,  Daugther of my former patient, now deceased, Leona Carry (copd, lung cancer(. Accompanied by husband. Very anxious in office. PErioidc sobbing. Wants door open. REports "allergy" to IV steroids (put her in coma) but has taken prednisone in past without problems    Rpeoritng chronic cough with recurrent episodes of bronchhits for few to several months. Appears each time afte rknee procedure she gets sick. Most recently had Rt knee procedure and then acute bronchitis. Not resolved with 1-2 courses of antibiotics. Severe cough. Points to throat. Mostly quality is dry. No clear cut aggravating or relieving factors. rSI cough score is 33 and detailed below and affects quality of life. They are very worried about sinister problems causing cough. In March 2014 Had admission for cough and hypoxemia through to be due to too much benzo and narcs     IMAGING - June 2013: ABd Ct lung cut - No ILD  - March 2014 CXR - clear  REC Cough is from sinus drainage, posible  acid reflux, and post bronchitis All of this is working together to cause cyclical cough/LPR cough  #Sinus drainage  - start hypertonic nasal saline spray at night, 2 sprays each night - start  take generic fluticasone inhaler 2 squirts each nostril daily  #Possible Acid Reflux  - continue prilosec and pepcid as before  #Possible Asthma  - for now continue albuterol as needed but do not exceed 4 times a day  #Acute bronchitis  - because you got levaquin and amox in recent weeks you cannot get that now  - try omnicef 300mg  twice daily x 5  Days   #Cyclical  cough  - please choose 2-3 days and observe complete voice rest - no talking or whispering  - at all times there  there is urge to cough, drink water or swallow or sip on throat lozenge - we cannot do neurontin because you are on lyrica, opioids and duloxetine - refer neuro rehab Mr Verdie Mosher   #Followup - I or my NP tammy will see you in 3-4 weeks.  - any problems call or come sooner  OV 03/03/2013  Fu cough  Significantly > 50% better. sEe RSI score for details. STill with some chest tightness; albuterol helps but uses it only rareley. She is opent to trying ICS (tolerates flonase, allergy was for IV Steroids). She feels antibiotics chelped most   Dr Gretta Cool Reflux Symptom Index (> 13-15 suggestive of LPR cough) 01/30/13 03/03/2013   Hoarseness of problem with voice 1 0  Clearing  Of Throat 5 0  Excess throat mucus or feeling of post nasal drip 5 2  Difficulty swallowing food, liquid or tablets 3 2  Cough after eating or lying down 1 1  Breathing difficulties or choking episodes 5 1  Troublesome or annoying cough 5 2  Sensation of something sticking in throat or lump in throat 5 3.5  Heartburn, chest pain, indigestion, or stomach acid coming up 3 3.5  TOTAL 33 15   Review of Systems  Constitutional: Negative for fever and unexpected weight change.  HENT: Negative for  congestion, dental problem, ear pain, nosebleeds, postnasal drip, rhinorrhea, sinus pressure, sneezing, sore throat and trouble swallowing.   Eyes: Negative for redness and itching.  Respiratory: Positive for cough. Negative for chest tightness, shortness of breath and wheezing.   Cardiovascular: Positive for chest pain. Negative for palpitations and leg swelling.  Gastrointestinal: Negative for nausea and vomiting.  Genitourinary: Negative for dysuria.  Musculoskeletal: Negative for joint swelling.  Skin: Negative for rash.  Neurological: Negative for headaches.  Hematological: Does not bruise/bleed  easily.  Psychiatric/Behavioral: Negative for dysphoric mood. The patient is not nervous/anxious.        Objective:   Physical Exam  Vitals reviewed. Constitutional: She is oriented to person, place, and time. She appears well-developed and well-nourished. No distress.  Body mass index is 37.7 kg/(m^2).   HENT:  Head: Normocephalic and atraumatic.  Right Ear: External ear normal.  Left Ear: External ear normal.  Mouth/Throat: Oropharynx is clear and moist. No oropharyngeal exudate.  Mild post nasal draingve  Eyes: Conjunctivae and EOM are normal. Pupils are equal, round, and reactive to light. Right eye exhibits no discharge. Left eye exhibits no discharge. No scleral icterus.  Neck: Normal range of motion. Neck supple. No JVD present. No tracheal deviation present. No thyromegaly present.  Cardiovascular: Normal rate, regular rhythm, normal heart sounds and intact distal pulses.  Exam reveals no gallop and no friction rub.   No murmur heard. Pulmonary/Chest: Effort normal and breath sounds normal. No respiratory distress. She has no wheezes. She has no rales. She exhibits no tenderness.  Abdominal: Soft. Bowel sounds are normal. She exhibits no distension and no mass. There is no tenderness. There is no rebound and no guarding.  Musculoskeletal: Normal range of motion. She exhibits no edema and no tenderness.  Rt knee in stabilizer  Lymphadenopathy:    She has no cervical adenopathy.  Neurological: She is alert and oriented to person, place, and time. She has normal reflexes. No cranial nerve deficit. She exhibits normal muscle tone. Coordination normal.  Skin: Skin is warm and dry. No rash noted. She is not diaphoretic. No erythema. No pallor.  Psychiatric: Judgment and thought content normal.  Less anxious          Assessment & Plan:

## 2013-03-27 ENCOUNTER — Other Ambulatory Visit: Payer: Self-pay | Admitting: Family Medicine

## 2013-03-27 ENCOUNTER — Ambulatory Visit
Admission: RE | Admit: 2013-03-27 | Discharge: 2013-03-27 | Disposition: A | Discharge status: Home / Self Care | Payer: BC Managed Care – PPO | Source: Ambulatory Visit | Attending: Family Medicine | Admitting: Family Medicine

## 2013-03-27 DIAGNOSIS — R05 Cough: Secondary | ICD-10-CM

## 2013-03-27 DIAGNOSIS — R059 Cough, unspecified: Secondary | ICD-10-CM

## 2013-04-21 ENCOUNTER — Encounter: Payer: Self-pay | Admitting: Adult Health

## 2013-04-28 ENCOUNTER — Ambulatory Visit (INDEPENDENT_AMBULATORY_CARE_PROVIDER_SITE_OTHER)
Admission: RE | Admit: 2013-04-28 | Discharge: 2013-04-28 | Disposition: A | Discharge status: Home / Self Care | Payer: BC Managed Care – PPO | Source: Ambulatory Visit | Attending: Adult Health | Admitting: Adult Health

## 2013-04-28 ENCOUNTER — Encounter: Payer: Self-pay | Admitting: Adult Health

## 2013-04-28 ENCOUNTER — Ambulatory Visit (INDEPENDENT_AMBULATORY_CARE_PROVIDER_SITE_OTHER): Payer: BC Managed Care – PPO | Admitting: Adult Health

## 2013-04-28 ENCOUNTER — Ambulatory Visit (HOSPITAL_COMMUNITY)
Admission: RE | Admit: 2013-04-28 | Discharge: 2013-04-28 | Disposition: A | Discharge status: Home / Self Care | Payer: BC Managed Care – PPO | Source: Ambulatory Visit | Attending: Adult Health | Admitting: Adult Health

## 2013-04-28 VITALS — BP 122/64 | HR 83 | Temp 100.4°F

## 2013-04-28 DIAGNOSIS — J209 Acute bronchitis, unspecified: Secondary | ICD-10-CM

## 2013-04-28 DIAGNOSIS — R0609 Other forms of dyspnea: Secondary | ICD-10-CM | POA: Insufficient documentation

## 2013-04-28 DIAGNOSIS — R0600 Dyspnea, unspecified: Secondary | ICD-10-CM

## 2013-04-28 DIAGNOSIS — R0989 Other specified symptoms and signs involving the circulatory and respiratory systems: Secondary | ICD-10-CM

## 2013-04-28 DIAGNOSIS — R6889 Other general symptoms and signs: Secondary | ICD-10-CM

## 2013-04-28 DIAGNOSIS — J9819 Other pulmonary collapse: Secondary | ICD-10-CM | POA: Insufficient documentation

## 2013-04-28 DIAGNOSIS — R0602 Shortness of breath: Secondary | ICD-10-CM | POA: Insufficient documentation

## 2013-04-28 DIAGNOSIS — R079 Chest pain, unspecified: Secondary | ICD-10-CM

## 2013-04-28 MED ORDER — IOHEXOL 350 MG/ML SOLN
100.0000 mL | Freq: Once | INTRAVENOUS | Status: AC | PRN
  Administered 2013-04-28: 100 mL via INTRAVENOUS

## 2013-04-28 MED ORDER — CEFDINIR 300 MG PO CAPS: 300.0000 mg | ORAL_CAPSULE | Freq: Two times a day (BID) | ORAL | Status: DC

## 2013-04-28 MED ORDER — HYDROCODONE-HOMATROPINE 5-1.5 MG/5ML PO SYRP: 5.0000 mL | ORAL_SOLUTION | Freq: Four times a day (QID) | ORAL | Status: DC | PRN

## 2013-04-28 NOTE — Assessment & Plan Note (Signed)
Neg flu swab  Omnicef 300mg  Twice daily  For 7 days  Mucinex DM Twice daily  As needed  Cough /congestion  Fluids and rest  Hydromet 1-2 tsp every 4-6 hr As needed  Cough , may make you sleepy.  Please contact office for sooner follow up if symptoms do not improve or  worsen or seek emergency care

## 2013-04-28 NOTE — Assessment & Plan Note (Signed)
Pleuritic Chest pain -neg cxr in office  Pt is in severe pain  Unable to do labs w/ d Dimer d/t office closing d/t inclement weather.  Will set up for CT chest PE protocol to r/o PE.  Advised if neg will need to use warm heat.

## 2013-04-28 NOTE — Progress Notes (Signed)
Subjective:    Patient ID: [name], female    DOB: [birth year], 48 y.o.   MRN: [id number]  HPI  IOV 01/30/2013 Chief Complaint  Patient presents with  . Advice Only    Pt c/o recurrent bronchitis.  Reffered by [facility].   48 year old female with multiple medical problems including obesity, chronic pain on opioids/lyrica/duloexetine,  Daugther of my former patient, now deceased, Leona Carry (copd, lung cancer(. Accompanied by husband. Very anxious in office. PErioidc sobbing. Wants door open. REports "allergy" to IV steroids (put her in coma) but has taken prednisone in past without problems    Rpeoritng chronic cough with recurrent episodes of bronchhits for few to several months. Appears each time afte rknee procedure she gets sick. Most recently had Rt knee procedure and then acute bronchitis. Not resolved with 1-2 courses of antibiotics. Severe cough. Points to throat. Mostly quality is dry. No clear cut aggravating or relieving factors. rSI cough score is 33 and detailed below and affects quality of life. They are very worried about sinister problems causing cough. In March 2014 Had admission for cough and hypoxemia through to be due to too much benzo and narcs     IMAGING - June 2013: ABd Ct lung cut - No ILD  - March 2014 CXR - clear  REC Cough is from sinus drainage, posible  acid reflux, and post bronchitis All of this is working together to cause cyclical cough/LPR cough  #Sinus drainage  - start hypertonic nasal saline spray at night, 2 sprays each night - start  take generic fluticasone inhaler 2 squirts each nostril daily  #Possible Acid Reflux  - continue prilosec and pepcid as before  #Possible Asthma  - for now continue albuterol as needed but do not exceed 4 times a day  #Acute bronchitis  - because you got levaquin and amox in recent weeks you cannot get that now  - try omnicef 300mg  twice daily x 5  Days   #Cyclical cough  -  please choose 2-3 days and observe complete voice rest - no talking or whispering  - at all times there  there is urge to cough, drink water or swallow or sip on throat lozenge - we cannot do neurontin because you are on lyrica, opioids and duloxetine - refer neuro rehab Mr Verdie Mosher   #Followup - I or my NP Courtez Twaddle will see you in 3-4 weeks.  - any problems call or come sooner  OV 03/03/2013  Fu cough  Significantly > 50% better. sEe RSI score for details. STill with some chest tightness; albuterol helps but uses it only rareley. She is opent to trying ICS (tolerates flonase, allergy was for IV Steroids). She feels antibiotics chelped most   Dr Gretta Cool Reflux Symptom Index (> 13-15 suggestive of LPR cough) 01/30/13 03/03/2013   Hoarseness of problem with voice 1 0  Clearing  Of Throat 5 0  Excess throat mucus or feeling of post nasal drip 5 2  Difficulty swallowing food, liquid or tablets 3 2  Cough after eating or lying down 1 1  Breathing difficulties or choking episodes 5 1  Troublesome or annoying cough 5 2  Sensation of something sticking in throat or lump in throat 5 3.5  Heartburn, chest pain, indigestion, or stomach acid coming up 3 3.5  TOTAL 33 15    04/28/2013 Acute OV  Pt complains of increased SOB, severe left lung / back pain, wheezing, prod cough  with white mucus x3days, worse this AM.  Body aches and fever 2 days . Neg flu swab in office today . Had right ankle injury last week  In air cast for ankle sprain vs Fx for the last week.  No calf pain. No hemotpysis , CXR clear today .  Pt is very emotional today in office, crying out in pain.  She denies any hemoptysis, orthopnea, PND, or recent travel. She    Review of Systems  Constitutional: Negative for fever and unexpected weight change.  HENT: Negative for congestion, dental problem, ear pain, nosebleeds, postnasal drip, rhinorrhea, sinus pressure, sneezing, sore throat and trouble swallowing.   Eyes:  Negative for redness and itching.  Respiratory: Positive for cough. Negative for chest tightness,  ++shortness of breath and wheezing.   Cardiovascular: Positive for chest pain. Negative for palpitations and leg swelling.  Gastrointestinal: Negative for nausea and vomiting.  Genitourinary: Negative for dysuria.  Musculoskeletal: Negative for joint swelling.  Skin: Negative for rash.  Neurological: Negative for headaches.  Hematological: Does not bruise/bleed easily.  Psychiatric/Behavioral: Negative for dysphoric mood. The patient is not nervous/anxious.        Objective:   Physical Exam  Vitals reviewed.   HENT:  Head: Normocephalic and atraumatic.  Right Ear: External ear normal.  Left Ear: External ear normal.  Mouth/Throat: Oropharynx is clear and moist. No oropharyngeal exudate.  Eyes: Conjunctivae and EOM are normal. Pupils are equal, round, and reactive to light. Right eye exhibits no discharge. Left eye exhibits no discharge. No scleral icterus.  Neck: Normal range of motion. Neck supple. No JVD present. No tracheal deviation present. No thyromegaly present.  Cardiovascular: Normal rate, regular rhythm, normal heart sounds and intact distal pulses.  Exam reveals no gallop and no friction rub.   No murmur heard. Pulmonary/Chest: Effort normal and breath sounds normal. No respiratory distress. She has no wheezes. She has no rales. Tenderness along posterior chest wall.  Abdominal: Soft. Bowel sounds are normal. She exhibits no distension and no mass. There is no tenderness. There is no rebound and no guarding.  Musculoskeletal: Normal range of motion. She exhibits no edema and no tenderness.  Right LE walking boot.  Lymphadenopathy:    She has no cervical adenopathy.  Neurological: She is alert and oriented to person, place, and time. She has normal reflexes. No cranial nerve deficit. She exhibits normal muscle tone. Coordination normal.  Skin: Skin is warm and dry. No rash  noted. She is not diaphoretic. No erythema. No pallor.  Psychiatric: Judgment and thought content normal.  +anxious ,,crying out in pain          Assessment & Plan:

## 2013-04-28 NOTE — Patient Instructions (Addendum)
We are setting you up for a CT chest today .  Omnicef 300mg  Twice daily  For 7 days  Mucinex DM Twice daily  As needed  Cough /congestion  Fluids and rest  Hydromet 1-2 tsp every 4-6 hr As needed  Cough , may make you sleepy.  Please contact office for sooner follow up if symptoms do not improve or worsen or seek emergency care

## 2013-04-30 ENCOUNTER — Telehealth: Payer: Self-pay | Admitting: Adult Health

## 2013-04-30 ENCOUNTER — Telehealth: Payer: Self-pay | Admitting: Internal Medicine

## 2013-04-30 NOTE — Telephone Encounter (Addendum)
Per pharmacist at [facility] antibiotic was picked up on 04/28/13 and refill for proair has been done Will call and advise pt's husband, that this rx was picked up.  Per pt's husband after CT scan was done TP increase omnifec x3 days. After reading TP's rec's from CT scan she has extended antibiotic x3 days, will call this into pharm and let pt's husband know this is done

## 2013-04-30 NOTE — Progress Notes (Signed)
Quick Note:  Office was closed 2.17.15 d/t inclement weather Per 2.18.15 phone note, rx was telephoned to pharmacy ______

## 2013-04-30 NOTE — Telephone Encounter (Signed)
There is a duplicate message open on this pt and this issue. Meghan is handling it.

## 2013-05-01 LAB — POCT INFLUENZA A/B
INFLUENZA A, POC: NEGATIVE
INFLUENZA B, POC: NEGATIVE

## 2013-05-02 ENCOUNTER — Ambulatory Visit: Payer: Self-pay | Admitting: Internal Medicine

## 2013-05-02 ENCOUNTER — Telehealth: Payer: Self-pay | Admitting: Internal Medicine

## 2013-05-02 NOTE — Telephone Encounter (Signed)
Spoke w/ pt. appt scheduled this afternoon to see MR. Nothing further needed

## 2013-05-05 ENCOUNTER — Ambulatory Visit (INDEPENDENT_AMBULATORY_CARE_PROVIDER_SITE_OTHER): Payer: BC Managed Care – PPO | Admitting: Internal Medicine

## 2013-05-05 ENCOUNTER — Encounter: Payer: Self-pay | Admitting: Internal Medicine

## 2013-05-05 VITALS — BP 130/88 | HR 86 | Ht 64.5 in

## 2013-05-05 DIAGNOSIS — R053 Chronic cough: Secondary | ICD-10-CM

## 2013-05-05 DIAGNOSIS — R059 Cough, unspecified: Secondary | ICD-10-CM

## 2013-05-05 DIAGNOSIS — R05 Cough: Secondary | ICD-10-CM

## 2013-05-05 MED ORDER — DOXYCYCLINE HYCLATE 100 MG PO TABS: 100.0000 mg | ORAL_TABLET | Freq: Two times a day (BID) | ORAL | Status: DC

## 2013-05-05 NOTE — Progress Notes (Signed)
Subjective:    Patient ID: [name], female    DOB: Nov 18, 1965, 48 y.o.   MRN: [id number]  HPI  OV 05/05/2013 acute office visit  48 year old lady with multifactorial cough and reported allergy to steroids. Most recently someone is practitioner 04/28/2013 for cough that followed exposure to a patient with viral pneumonia the skilled nursing facility. Due to severe level symptoms along with shortness of breath CT angiogram was done but this ruled out a pulmonary embolism. There was some description of patchy bilateral upper lobe infiltrates or groundglass opacities consistent with viral pneumonitis. Due to the severe extensive symptoms out of proportion to clinical status and objective findings Omnicef was called in. However patient returns today for a 23rd 2015 saying she is no better. She's had cough for the better part of 3 weeks. Cough is severe and occurs randomly. Cough is associated with severe fatigue and also muscle pull. She says she's having a difficult to breathe and he because of chest soreness. She is asking for antibiotics and I prescribed to her in December; however upon review this is the same [place]. She is keen that she wants another antibiotic and some simple measures to suppress her cough. She also wants to restart her Qvar but she's been noncompliant with. She does not want steroids due to reported allergies [I offered [facility] referral to the allergist her but at this point she wants to hold off]  Past medical history reviewed    Review of Systems  Constitutional: Negative for fever and unexpected weight change.  HENT: Negative for congestion, dental problem, ear pain, nosebleeds, postnasal drip, rhinorrhea, sinus pressure, sneezing, sore throat and trouble swallowing.   Eyes: Negative for redness and itching.  Respiratory: Positive for cough, chest tightness, shortness of breath and wheezing.   Cardiovascular: Negative for palpitations and leg swelling.   Gastrointestinal: Negative for nausea and vomiting.  Genitourinary: Negative for dysuria.  Musculoskeletal: Negative for joint swelling.  Skin: Negative for rash.  Neurological: Negative for headaches.  Hematological: Does not bruise/bleed easily.  Psychiatric/Behavioral: Negative for dysphoric mood. The patient is not nervous/anxious.        Objective:   Physical Exam Vitals reviewed.   HENT:  Head: Normocephalic and atraumatic.  Right Ear: External ear normal.  Left Ear: External ear normal.  Mouth/Throat: Oropharynx is clear and moist. No oropharyngeal exudate.  Eyes: Conjunctivae and EOM are normal. Pupils are equal, round, and reactive to light. Right eye exhibits no discharge. Left eye exhibits no discharge. No scleral icterus.  Neck: Normal range of motion. Neck supple. No JVD present. No tracheal deviation present. No thyromegaly present.  Cardiovascular: Normal rate, regular rhythm, normal heart sounds and intact distal pulses.  Exam reveals no gallop and no friction rub.   No murmur heard. Pulmonary/Chest: Effort normal and breath sounds normal. No respiratory distress. She has no wheezes. She has no rales. Tenderness along posterior chest wall.  Abdominal: Soft. Bowel sounds are normal. She exhibits no distension and no mass. There is no tenderness. There is no rebound and no guarding.  Musculoskeletal: Normal range of motion. She exhibits no edema and no tenderness.  Right LE walking boot.  Lymphadenopathy:    She has no cervical adenopathy.  Neurological: She is alert and oriented to person, place, and time. She has normal reflexes. No cranial nerve deficit. She exhibits normal muscle tone. Coordination normal.  Skin: Skin is warm and dry. No rash noted. She is not diaphoretic. No erythema. No  pallor.  Psychiatric: Judgment and thought content normal.  +anxious ,,crying out in pain , bizaare affect, pants, clutches body at different parts saying she is coughing and is in  pain but no cough observed, wants door open, can close eyes and then try to fall off        Assessment & Plan:

## 2013-05-05 NOTE — Patient Instructions (Addendum)
USe prior hycodan for cough Take doxycycline 100mg  po twice daily x 5 days; take after meals and avoid sunlight Restart QVAR 80mcg 2 puff twice daily We discussed referral to [facility]  - let us know when you are ready to make that move  Followup  6 months or sooner if needed

## 2013-05-06 NOTE — Assessment & Plan Note (Signed)
USe prior hycodan for cough Take doxycycline 100mg  po twice daily x 5 days; take after meals and avoid sunlight Restart QVAR 80mcg 2 puff twice daily We discussed referral to [facility]  - let us know when you are ready to make that move  Followup  6 months or sooner if needed   > 50% of this > 25 min visit spent in face to face counseling (15 min visit converted to 25 min)

## 2013-07-23 ENCOUNTER — Other Ambulatory Visit: Payer: Self-pay | Admitting: Pain Medicine

## 2013-07-23 DIAGNOSIS — M542 Cervicalgia: Secondary | ICD-10-CM

## 2013-07-23 DIAGNOSIS — M549 Dorsalgia, unspecified: Secondary | ICD-10-CM

## 2013-07-27 ENCOUNTER — Ambulatory Visit
Admission: RE | Admit: 2013-07-27 | Discharge: 2013-07-27 | Disposition: A | Discharge status: Home / Self Care | Payer: BC Managed Care – PPO | Source: Ambulatory Visit | Attending: Pain Medicine | Admitting: Pain Medicine

## 2013-07-27 DIAGNOSIS — M542 Cervicalgia: Secondary | ICD-10-CM

## 2013-07-27 DIAGNOSIS — M549 Dorsalgia, unspecified: Secondary | ICD-10-CM

## 2013-07-27 MED ORDER — GADOBENATE DIMEGLUMINE 529 MG/ML IV SOLN
20.0000 mL | Freq: Once | INTRAVENOUS | Status: AC | PRN
  Administered 2013-07-27: 20 mL via INTRAVENOUS

## 2013-10-28 ENCOUNTER — Ambulatory Visit (INDEPENDENT_AMBULATORY_CARE_PROVIDER_SITE_OTHER): Payer: BC Managed Care – PPO | Admitting: Internal Medicine

## 2013-10-28 ENCOUNTER — Encounter: Payer: Self-pay | Admitting: Internal Medicine

## 2013-10-28 VITALS — BP 110/62 | HR 89 | Ht 64.0 in | Wt 250.0 lb

## 2013-10-28 DIAGNOSIS — J209 Acute bronchitis, unspecified: Secondary | ICD-10-CM

## 2013-10-28 MED ORDER — DOXYCYCLINE HYCLATE 100 MG PO TABS: 100.0000 mg | ORAL_TABLET | Freq: Two times a day (BID) | ORAL | Status: DC

## 2013-10-28 MED ORDER — FLUTICASONE PROPIONATE 50 MCG/ACT NA SUSP: 2.0000 | Freq: Every day | NASAL | Status: DC

## 2013-10-28 NOTE — Progress Notes (Signed)
Subjective:    Patient ID: [name], female    DOB: [birth year], 48 y.o.   MRN: [id number]  HPI   OV 05/05/2013 acute office visit  48 year old lady with multifactorial cough and reported allergy to steroids. Most recently saw [facility] 04/28/2013 for cough that followed exposure to a patient with viral pneumonia the skilled nursing facility. Due to severe level symptoms along with shortness of breath CT angiogram was done but this ruled out a pulmonary embolism. There was some description of patchy bilateral upper lobe infiltrates or groundglass opacities consistent with viral pneumonitis. Due to the severe extensive symptoms out of proportion to clinical status and objective findings Omnicef was called in. However patient returns today for a 23rd 2015 saying she is no better. She's had cough for the better part of 3 weeks. Cough is severe and occurs randomly. Cough is associated with severe fatigue and also muscle pull. She says she's having a difficult to breathe and he because of chest soreness. She is asking for antibiotics and I prescribed to her in December; however upon review this is the same [place]. She is keen that she wants another antibiotic and some simple measures to suppress her cough. She also wants to restart her Qvar but she's been noncompliant with. She does not want steroids due to reported allergies [[facility] her but at this point she wants to hold off]  Past medical history reviewed   USe prior hycodan for cough Take doxycycline 100mg  po twice daily x 5 days; take after meals and avoid sunlight Restart QVAR 80mcg 2 puff twice daily We discussed referral to [facility]  - let us know when you are ready to make that move  Followup  6 months or sooner if needed   OV 10/28/2013  Chief Complaint  Patient presents with  . Follow-up    Pt c/o wheezing, prod cough with green mucous and right sided chest pain. Pt denies  DOE.     Chronic cough followup: OVerall well. Cough is only mild. QVAR helps; now run out and wants refill. Last few days having increased cough with some green sputum but without fever or chills or wheeze. Has upcoming knee surgery at some point in future so wants infection cleared out with antibiotics. HAs tolerated doxy well in past per chart review  Past, Family, Social reviewed: no change since last visit. Continues to have psychosomatic issues with chronic pain, obesity, trigger points etc.,     Review of Systems  Constitutional: Negative for fever and unexpected weight change.  HENT: Negative for congestion, dental problem, ear pain, nosebleeds, postnasal drip, rhinorrhea, sinus pressure, sneezing, sore throat and trouble swallowing.   Eyes: Negative for redness and itching.  Respiratory: Positive for cough and shortness of breath. Negative for chest tightness and wheezing.   Cardiovascular: Negative for palpitations and leg swelling.  Gastrointestinal: Negative for nausea and vomiting.  Genitourinary: Negative for dysuria.  Musculoskeletal: Negative for joint swelling.  Skin: Negative for rash.  Neurological: Negative for headaches.  Hematological: Does not bruise/bleed easily.  Psychiatric/Behavioral: Negative for dysphoric mood. The patient is not nervous/anxious.        Objective:   Physical Exam  Vitals reviewed. Constitutional: She is oriented to person, place, and time. She appears well-developed and well-nourished. No distress.  obese  HENT:  Head: Normocephalic and atraumatic.  Right Ear: External ear normal.  Left Ear: External ear normal.  Mouth/Throat: Oropharynx is clear  and moist. No oropharyngeal exudate.  Eyes: Conjunctivae and EOM are normal. Pupils are equal, round, and reactive to light. Right eye exhibits no discharge. Left eye exhibits no discharge. No scleral icterus.  Neck: Normal range of motion. Neck supple. No JVD present. No tracheal deviation  present. No thyromegaly present.  Cardiovascular: Normal rate, regular rhythm, normal heart sounds and intact distal pulses.  Exam reveals no gallop and no friction rub.   No murmur heard. Pulmonary/Chest: Effort normal and breath sounds normal. No respiratory distress. She has no wheezes. She has no rales. She exhibits no tenderness.  Abdominal: Soft. Bowel sounds are normal. She exhibits no distension and no mass. There is no tenderness. There is no rebound and no guarding.  Musculoskeletal: Normal range of motion. She exhibits no edema and no tenderness.  Antalgic gait   Lymphadenopathy:    She has no cervical adenopathy.  Neurological: She is alert and oriented to person, place, and time. She has normal reflexes. No cranial nerve deficit. She exhibits normal muscle tone. Coordination normal.  Skin: Skin is warm and dry. No rash noted. She is not diaphoretic. No erythema. No pallor.  Psychiatric: She has a normal mood and affect. Her behavior is normal. Judgment and thought content normal.  Flat and anxious effect with some slurring - chronic baseline     Filed Vitals:   10/28/13 1138  BP: 110/62  Pulse: 89  Height: 5\' 4"  (1.626 m)  Weight: 250 lb (113.399 kg)  SpO2: 97%        Assessment & Plan:  #Chronic cough with acute bronchitis Take doxycycline 100mg  po twice daily x 5 days; take after meals and avoid sunlight Continue  QVAR 2 puff twice daily; take refill   Followup  12 months or sooner if needed

## 2013-10-28 NOTE — Patient Instructions (Addendum)
#  Chronic cough with acute bronchitis Take doxycycline 100mg  po twice daily x 5 days; take after meals and avoid sunlight Continue  QVAR 80mcg 2 puff twice daily; take refill   Followup  12 months or sooner if needed

## 2013-11-02 NOTE — Assessment & Plan Note (Signed)
#  Chronic cough with acute bronchitis Take doxycycline  po twice daily x 5 days; take after meals and avoid sunlight Continue  QVAR 2 puff twice daily; take refill   Followup  12 months or sooner if neede

## 2013-12-31 ENCOUNTER — Encounter (HOSPITAL_COMMUNITY): Payer: Self-pay | Admitting: Pharmacy Technician

## 2014-01-05 NOTE — Patient Instructions (Addendum)
Khrystyna Schwalm [facility]  01/05/2014   Your procedure is scheduled on:  01/13/2014    Come thru [facility] Entrance   Follow the Signs to Short Stay Center at    0955    am  Call this number if you have problems the morning of surgery: [phone]   Remember:   Do not eat food or drink liquids after midnight.   Take these medicines the morning of surgery with A SIP OF WATER: Albuterol Inhaler if needed, Xanax, Qvar Inhaler, Wellbutrin, Flonase if needed, Levothyroxine, , Percocet, Ocean Nasal [place], Flomax if needed Bring Inhalers with you    Do not wear jewelry, make-up or nail polish.  Do not wear lotions, powders, or perfumes. deodorant.  Do not shave 48 hours prior to surgery.  Do not bring valuables to the hospital.  Contacts, dentures or bridgework may not be worn into surgery.  Leave suitcase in the car. After surgery it may be brought to your room.  For patients admitted to the hospital, checkout time is 11:00 AM the day of  discharge.   [facility] - Preparing for Surgery Before surgery, you can play an important role.  Because skin is not sterile, your skin needs to be as free of germs as possible.  You can reduce the number of germs on your skin by washing with CHG (chlorahexidine gluconate) soap before surgery.  CHG is an antiseptic cleaner which kills germs and bonds with the skin to continue killing germs even after washing. Please DO NOT use if you have an allergy to CHG or antibacterial soaps.  If your skin becomes reddened/irritated stop using the CHG and inform your nurse when you arrive at Short Stay. Do not shave (including legs and underarms) for at least 48 hours prior to the first CHG shower.  You may shave your face/neck. Please follow these instructions carefully:  1.  Shower with CHG Soap the night before surgery and the  morning of Surgery.  2.  If you choose to wash your hair, wash your hair first as usual with your  normal  shampoo.  3.  After you shampoo, rinse your  hair and body thoroughly to remove the  shampoo.                           4.  Use CHG as you would any other liquid soap.  You can apply chg directly  to the skin and wash                       Gently with a scrungie or clean washcloth.  5.  Apply the CHG Soap to your body ONLY FROM THE NECK DOWN.   Do not use on face/ open                           Wound or open sores. Avoid contact with eyes, ears mouth and genitals (private parts).                       Wash face,  Genitals (private parts) with your normal soap.             6.  Wash thoroughly, paying special attention to the area where your surgery  will be performed.  7.  Thoroughly rinse your body with warm water from the neck down.  8.  DO NOT  shower/wash with your normal soap after using and rinsing off  the CHG Soap.                9.  Pat yourself dry with a clean towel.            10.  Wear clean pajamas.            11.  Place clean sheets on your bed the night of your first shower and do not  sleep with pets. Day of Surgery : Do not apply any lotions/deodorants the morning of surgery.  Please wear clean clothes to the hospital/surgery center.  FAILURE TO FOLLOW THESE INSTRUCTIONS MAY RESULT IN THE CANCELLATION OF YOUR SURGERY PATIENT SIGNATURE_________________________________  NURSE SIGNATURE__________________________________  ________________________________________________________________________  WHAT IS A BLOOD TRANSFUSION? Blood Transfusion Information  A transfusion is the replacement of blood or some of its parts. Blood is made up of multiple cells which provide different functions.  Red blood cells carry oxygen and are used for blood loss replacement.  White blood cells fight against infection.  Platelets control bleeding.  Plasma helps clot blood.  Other blood products are available for specialized needs, such as hemophilia or other clotting disorders. BEFORE THE TRANSFUSION  Who gives blood for transfusions?    Healthy volunteers who are fully evaluated to make sure their blood is safe. This is blood bank blood. Transfusion therapy is the safest it has ever been in the practice of medicine. Before blood is taken from a donor, a complete history is taken to make sure that person has no history of diseases nor engages in risky social behavior (examples are intravenous drug use or sexual activity with multiple partners). The donor's travel history is screened to minimize risk of transmitting infections, such as malaria. The donated blood is tested for signs of infectious diseases, such as HIV and hepatitis. The blood is then tested to be sure it is compatible with you in order to minimize the chance of a transfusion reaction. If you or a relative donates blood, this is often done in anticipation of surgery and is not appropriate for emergency situations. It takes many days to process the donated blood. RISKS AND COMPLICATIONS Although transfusion therapy is very safe and saves many lives, the main dangers of transfusion include:   Getting an infectious disease.  Developing a transfusion reaction. This is an allergic reaction to something in the blood you were given. Every precaution is taken to prevent this. The decision to have a blood transfusion has been considered carefully by your caregiver before blood is given. Blood is not given unless the benefits outweigh the risks. AFTER THE TRANSFUSION  Right after receiving a blood transfusion, you will usually feel much better and more energetic. This is especially true if your red blood cells have gotten low (anemic). The transfusion raises the level of the red blood cells which carry oxygen, and this usually causes an energy increase.  The nurse administering the transfusion will monitor you carefully for complications. HOME CARE INSTRUCTIONS  No special instructions are needed after a transfusion. You may find your energy is better. Speak with your  caregiver about any limitations on activity for underlying diseases you may have. SEEK MEDICAL CARE IF:   Your condition is not improving after your transfusion.  You develop redness or irritation at the intravenous (IV) site. SEEK IMMEDIATE MEDICAL CARE IF:  Any of the following symptoms occur over the next 12 hours:  Shaking chills.  You have a  temperature by mouth above 102 F (38.9 C), not controlled by medicine.  Chest, back, or muscle pain.  People around you feel you are not acting correctly or are confused.  Shortness of breath or difficulty breathing.  Dizziness and fainting.  You get a rash or develop hives.  You have a decrease in urine output.  Your urine turns a dark color or changes to pink, red, or brown. Any of the following symptoms occur over the next 10 days:  You have a temperature by mouth above 102 F (38.9 C), not controlled by medicine.  Shortness of breath.  Weakness after normal activity.  The white part of the eye turns yellow (jaundice).  You have a decrease in the amount of urine or are urinating less often.  Your urine turns a dark color or changes to pink, red, or brown. Document Released: 02/25/2000 Document Revised: 05/22/2011 Document Reviewed: 10/14/2007 ExitCare Patient Information 2014 [place].  _______________________________________________________________________  Incentive Spirometer  An incentive spirometer is a tool that can help keep your lungs clear and active. This tool measures how well you are filling your lungs with each breath. Taking long deep breaths may help reverse or decrease the chance of developing breathing (pulmonary) problems (especially infection) following:  A long period of time when you are unable to move or be active. BEFORE THE PROCEDURE   If the spirometer includes an indicator to show your best effort, your nurse or respiratory therapist will set it to a desired goal.  If possible, sit  up straight or lean slightly forward. Try not to slouch.  Hold the incentive spirometer in an upright position. INSTRUCTIONS FOR USE  1. Sit on the edge of your bed if possible, or sit up as far as you can in bed or on a chair. 2. Hold the incentive spirometer in an upright position. 3. Breathe out normally. 4. Place the mouthpiece in your mouth and seal your lips tightly around it. 5. Breathe in slowly and as deeply as possible, raising the piston or the ball toward the top of the column. 6. Hold your breath for 3-5 seconds or for as long as possible. Allow the piston or ball to fall to the bottom of the column. 7. Remove the mouthpiece from your mouth and breathe out normally. 8. Rest for a few seconds and repeat Steps 1 through 7 at least 10 times every 1-2 hours when you are awake. Take your time and take a few normal breaths between deep breaths. 9. The spirometer may include an indicator to show your best effort. Use the indicator as a goal to work toward during each repetition. 10. After each set of 10 deep breaths, practice coughing to be sure your lungs are clear. If you have an incision (the cut made at the time of surgery), support your incision when coughing by placing a pillow or rolled up towels firmly against it. Once you are able to get out of bed, walk around indoors and cough well. You may stop using the incentive spirometer when instructed by your caregiver.  RISKS AND COMPLICATIONS  Take your time so you do not get dizzy or light-headed.  If you are in pain, you may need to take or ask for pain medication before doing incentive spirometry. It is harder to take a deep breath if you are having pain. AFTER USE  Rest and breathe slowly and easily.  It can be helpful to keep track of a log of your progress. Your  caregiver can provide you with a simple table to help with this. If you are using the spirometer at home, follow these instructions: SEEK MEDICAL CARE IF:   You are  having difficultly using the spirometer.  You have trouble using the spirometer as often as instructed.  Your pain medication is not giving enough relief while using the spirometer.  You develop fever of 100.5 F (38.1 C) or higher. SEEK IMMEDIATE MEDICAL CARE IF:   You cough up bloody sputum that had not been present before.  You develop fever of 102 F (38.9 C) or greater.  You develop worsening pain at or near the incision site. MAKE SURE YOU:   Understand these instructions.  Will watch your condition.  Will get help right away if you are not doing well or get worse. Document Released: 07/10/2006 Document Revised: 05/22/2011 Document Reviewed: 09/10/2006 ExitCare Patient Information 2014 ExitCare, [place].   ________________________________________________________________________    Please read over the following fact sheets that you were given: MRSA Information, coughing and deep breathing exercises, leg exercises

## 2014-01-06 ENCOUNTER — Encounter (HOSPITAL_COMMUNITY)
Admission: RE | Admit: 2014-01-06 | Discharge: 2014-01-06 | Disposition: A | Discharge status: Home / Self Care | Payer: BC Managed Care – PPO | Source: Ambulatory Visit | Attending: Orthopedic Surgery | Admitting: Orthopedic Surgery

## 2014-01-06 ENCOUNTER — Encounter (HOSPITAL_COMMUNITY): Payer: Self-pay

## 2014-01-06 DIAGNOSIS — Z01812 Encounter for preprocedural laboratory examination: Secondary | ICD-10-CM | POA: Insufficient documentation

## 2014-01-06 DIAGNOSIS — Z79899 Other long term (current) drug therapy: Secondary | ICD-10-CM | POA: Diagnosis not present

## 2014-01-06 HISTORY — DX: Other chronic pancreatitis: K86.1

## 2014-01-06 HISTORY — DX: Pneumonia, unspecified organism: J18.9

## 2014-01-06 LAB — SURGICAL PCR SCREEN
MRSA, PCR: POSITIVE — AB
Staphylococcus aureus: POSITIVE — AB

## 2014-01-06 LAB — CBC
HEMATOCRIT: 39.7 % (ref 36.0–46.0)
Hemoglobin: 12.4 g/dL (ref 12.0–15.0)
MCH: 26.1 pg (ref 26.0–34.0)
MCHC: 31.2 g/dL (ref 30.0–36.0)
MCV: 83.6 fL (ref 78.0–100.0)
Platelets: 278 10*3/uL (ref 150–400)
RBC: 4.75 MIL/uL (ref 3.87–5.11)
RDW: 15.9 % — AB (ref 11.5–15.5)
WBC: 5.4 10*3/uL (ref 4.0–10.5)

## 2014-01-06 LAB — URINALYSIS, ROUTINE W REFLEX MICROSCOPIC
Bilirubin Urine: NEGATIVE
Glucose, UA: NEGATIVE mg/dL
Hgb urine dipstick: NEGATIVE
KETONES UR: NEGATIVE mg/dL
LEUKOCYTES UA: NEGATIVE
Nitrite: NEGATIVE
PH: 6.5 (ref 5.0–8.0)
Protein, ur: NEGATIVE mg/dL
Specific Gravity, Urine: 1.026 (ref 1.005–1.030)
Urobilinogen, UA: 0.2 mg/dL (ref 0.0–1.0)

## 2014-01-06 LAB — BASIC METABOLIC PANEL
Anion gap: 13 (ref 5–15)
BUN: 18 mg/dL (ref 6–23)
CALCIUM: 9.7 mg/dL (ref 8.4–10.5)
CO2: 29 meq/L (ref 19–32)
CREATININE: 0.91 mg/dL (ref 0.50–1.10)
Chloride: 96 mEq/L (ref 96–112)
GFR calc Af Amer: 85 mL/min — ABNORMAL LOW (ref >90)
GFR calc non Af Amer: 73 mL/min — ABNORMAL LOW (ref >90)
GLUCOSE: 102 mg/dL — AB (ref 70–99)
Potassium: 3.9 mEq/L (ref 3.7–5.3)
Sodium: 138 mEq/L (ref 137–147)

## 2014-01-06 LAB — PROTIME-INR
INR: 1.03 (ref 0.00–1.49)
Prothrombin Time: 13.6 seconds (ref 11.6–15.2)

## 2014-01-06 LAB — APTT: aPTT: 38 seconds — ABNORMAL HIGH (ref 24–37)

## 2014-01-06 NOTE — Progress Notes (Signed)
PTT done 01/06/2014 faxed via EPIC to DR Charlann Boxerlin.

## 2014-01-06 NOTE — Progress Notes (Signed)
CXR 04/28/13  EP[facility]  10/28/13  [facility]wamy [facility]  Clearance- Dr Clelia CroftShaw- 06/09/13 on chart

## 2014-01-07 NOTE — Progress Notes (Signed)
Called [facility] and verified dosage of Nucynta ER 200mg  every 12 hours per pharmacy.  Made Dr Okey Dupreose( anesthesia) aware of numerous meds that patient was taking and I had instructed her to take Nucynta prior to surgery.  Dr Okey Dupreose stated ok for patient to take Nucynta prior to surgery and no new orders given.

## 2014-01-11 NOTE — H&P (Signed)
TOTAL KNEE ADMISSION H&P  Patient is being admitted for right total knee arthroplasty.  Subjective:  Chief Complaint:     Right knee primary OA / pain.  HPI: [name], 48 y.o. female, has a history of pain and functional disability in the right knee due to arthritis and has failed non-surgical conservative treatments for greater than 12 weeks to includeNSAID's and/or analgesics, corticosteriod injections, viscosupplementation injections and activity modification.  Onset of symptoms was gradual, starting 3+ years ago with gradually worsening course since that time. The patient noted prior procedures on the knee to include  arthroscopy and menisectomy on the right knee(s).  Patient currently rates pain in the right knee(s) at 10 out of 10 with activity. Patient has night pain, worsening of pain with activity and weight bearing, pain that interferes with activities of daily living, pain with passive range of motion, crepitus and joint swelling.  Patient has evidence of periarticular osteophytes and joint space narrowing by imaging studies.  There is no active infection.  Risks, benefits and expectations were discussed with the patient.  Risks including but not limited to the risk of anesthesia, blood clots, nerve damage, blood vessel damage, failure of the prosthesis, infection and up to and including death.  Patient understand the risks, benefits and expectations and wishes to proceed with surgery.   PCP: Lupita RaiderSHAW,KIMBERLEE, MD  D/C Plans:      Home with HHPT  Post-op Meds:       No Rx given   Tranexamic Acid:      To be given - IV    Decadron:      Is not to be given - stated allergy  FYI:     ASA post-op  Morphine good (though she can't tolerate Dilaudid)  Oxycodone 10-20 mg  Continue Nycenta     Patient Active Problem List   Diagnosis Date Noted  . Acute bronchitis 04/28/2013  . Chest pain 04/28/2013  . Chronic cough 02/02/2013  . Hypoxia in the setting of recent bronchitis and  cough 06/07/2012  . Toxic encephalopathy 06/07/2012  . Leukocytosis 07/27/2011  . Metabolic encephalopathy 07/27/2011  . UTI (lower urinary tract infection) 07/27/2011  . Hx of pancreatitis 07/27/2011  . Anemia 07/27/2011  . Hypothyroid 07/27/2011  . Depression 07/27/2011  . GERD (gastroesophageal reflux disease) 07/27/2011  . Pancreatic pseudocyst 07/27/2011   Past Medical History  Diagnosis Date  . Depression   . GERD (gastroesophageal reflux disease)   . Hypothyroidism   . Pancreatic pseudocyst   . History of kidney stones   . IBS (irritable bowel syndrome)   . PONV (postoperative nausea and vomiting)   . History of pancreatitis 01-04-2011  . DDD (degenerative disc disease), cervical   . Right ureteral stone   . Muscle spasms of neck   . Cervical pain (neck)   . Right flank pain   . Nausea DUE TO PAIN FROM KIDNEY STONE  . Right knee pain   . Swelling of right knee joint   . Pneumonia     hx of pneumonia   . Chronic pancreatitis     Past Surgical History  Procedure Laterality Date  . Eus  03/01/2011    Procedure: UPPER ENDOSCOPIC ULTRASOUND (EUS) LINEAR;  Surgeon: Freddy JakschWilliam M Outlaw, MD;  Location: [facility];  Service: Endoscopy;  Laterality: N/A;  . Fine needle aspiration  03/01/2011    Procedure: FINE NEEDLE ASPIRATION (FNA) LINEAR;  Surgeon: Freddy JakschWilliam M Outlaw, MD;  Location: [facility];  Service: Endoscopy;  Laterality: N/A;  . Laparoscopic cholecystectomy  01-09-2011  . Anterior lumbar fusion  11-09-2010     L5 - S1  . Robotic-assisted right salpingo-oophorectomy w/ lysis adhesions  12-21-2009    HX ENDOMETRIOSIS AND PELVIC PAIN  . Cysto/ right retrograde pyelogram/ right ureteroscopy and stent placement  09-24-2009    HX RIGHT URETERAL CALCULUS  . Diagnostic laparoscopy  1992    ENDOMETRIOSIS  . Laparoscopic assisted vaginal hysterectomy  1997  . Ureteroscopic stone extractions      X3  . Cervical fusion  1999    C4 - 7  . Cervical spine surgery  APRIL  2011    REMOVAL SCAR TISSUE  . Tonsillectomy  AS CHILD  . Hernia repair  AS CHILD  . Knee arthroscopy  AUG 2013    RIGHT KNEE  . Cystoscopy with ureteroscopy  12/20/2011    Procedure: CYSTOSCOPY WITH URETEROSCOPY;  Surgeon: Milford Cage, MD;  Location: [facility];  Service: Urology;  Laterality: Right;  . Abdominal hysterectomy    . Joint replacement    . Back surgery  2012    lower back surgery     No prescriptions prior to admission   Allergies  Allergen Reactions  . Deltasone [Prednisone] Shortness Of Breath  . Solu-Medrol [Methylprednisolone] Shortness Of Breath and Other (See Comments)    Sob, Ams  . Dilaudid [Hydromorphone Hcl]     Hallucinations  . Zolpidem Tartrate Other (See Comments)    hallucinations    History  Substance Use Topics  . Smoking status: Never Smoker   . Smokeless tobacco: Never Used  . Alcohol Use: No    Family History  Problem Relation Age of Onset  . Cancer Father   . Fibromyalgia Sister      Review of Systems  Constitutional: Negative.   HENT: Negative.   Eyes: Negative.   Respiratory: Positive for cough.   Cardiovascular: Negative.   Gastrointestinal: Positive for heartburn and nausea.  Genitourinary: Negative.   Musculoskeletal: Positive for back pain and joint pain.  Skin: Negative.   Neurological: Negative.   Endo/Heme/Allergies: Negative.   Psychiatric/Behavioral: Positive for depression.    Objective:  Physical Exam  Constitutional: She is oriented to person, place, and time. She appears well-developed and well-nourished.  HENT:  Head: Normocephalic and atraumatic.  Eyes: Pupils are equal, round, and reactive to light.  Neck: Neck supple. No JVD present. No tracheal deviation present. No thyromegaly present.  Cardiovascular: Normal rate, regular rhythm, normal heart sounds and intact distal pulses.   Respiratory: Effort normal and breath sounds normal. No stridor. No respiratory distress. She has  no wheezes.  GI: Soft. There is no tenderness. There is no guarding.  Musculoskeletal:       Right knee: She exhibits decreased range of motion, swelling and bony tenderness. She exhibits no ecchymosis, no deformity, no laceration and no erythema. Tenderness found.  Lymphadenopathy:    She has no cervical adenopathy.  Neurological: She is alert and oriented to person, place, and time.  Skin: Skin is warm and dry.  Psychiatric: She has a normal mood and affect.     Labs:  Estimated body mass index is 42.89 kg/(m^2) as calculated from the following:   Height as of 10/28/13: 5\' 4"  (1.626 m).   Weight as of 10/28/13: 113.399 kg (250 lb).   Imaging Review Plain radiographs demonstrate severe degenerative joint disease of the right knee(s). The overall alignment is neutral. The bone quality appears to be  good for age and reported activity level.  Assessment/Plan:  End stage arthritis, right knee   The patient history, physical examination, clinical judgment of the provider and imaging studies are consistent with end stage degenerative joint disease of the right knee(s) and total knee arthroplasty is deemed medically necessary. The treatment options including medical management, injection therapy arthroscopy and arthroplasty were discussed at length. The risks and benefits of total knee arthroplasty were presented and reviewed. The risks due to aseptic loosening, infection, stiffness, patella tracking problems, thromboembolic complications and other imponderables were discussed. The patient acknowledged the explanation, agreed to proceed with the plan and consent was signed. Patient is being admitted for inpatient treatment for surgery, pain control, PT, OT, prophylactic antibiotics, VTE prophylaxis, progressive ambulation and ADL's and discharge planning. The patient is planning to be discharged home with home health services.    Anastasio AuerbachMatthew S. Demian Maisel   PA-C  01/11/2014, 8:22 PM

## 2014-01-12 NOTE — Anesthesia Preprocedure Evaluation (Addendum)
Anesthesia Evaluation  Patient identified by MRN, date of birth, ID band Patient awake    Reviewed: Allergy & Precautions, H&P , NPO status , Patient's Chart, lab work & pertinent test results  History of Anesthesia Complications (+) PONV and history of anesthetic complications  Airway Mallampati: II  TM Distance: >3 FB Neck ROM: Full    Dental no notable dental hx. (+) Chipped   Pulmonary pneumonia -, resolved,  Hx of chronic cough and bronchitis, seen by pulmonology, recently restarted on QVAR, no acute exaccerbations breath sounds clear to auscultation  Pulmonary exam normal       Cardiovascular Exercise Tolerance: Good negative cardio ROS  Rhythm:Regular Rate:Normal     Neuro/Psych PSYCHIATRIC DISORDERS Anxiety Depression negative neurological ROS     GI/Hepatic Neg liver ROS, GERD-  Medicated and Controlled,  Endo/Other  Hypothyroidism Morbid obesity  Renal/GU negative Renal ROS  negative genitourinary   Musculoskeletal  (+) Arthritis -, Osteoarthritis,  Hx of neck spasms, cervical and lumbar fusions    Abdominal   Peds negative pediatric ROS (+)  Hematology negative hematology ROS (+)   Anesthesia Other Findings   Reproductive/Obstetrics negative OB ROS                            Anesthesia Physical Anesthesia Plan  ASA: III  Anesthesia Plan: Spinal   Post-op Pain Management:    Induction: Intravenous  Airway Management Planned: Nasal Cannula  Additional Equipment:   Intra-op Plan:   Post-operative Plan: Extubation in OR  Informed Consent: I have reviewed the patients History and Physical, chart, labs and discussed the procedure including the risks, benefits and alternatives for the proposed anesthesia with the patient or authorized representative who has indicated his/her understanding and acceptance.   Dental advisory given  Plan Discussed with:  CRNA  Anesthesia Plan Comments:        Anesthesia Quick Evaluation

## 2014-01-13 ENCOUNTER — Inpatient Hospital Stay (HOSPITAL_COMMUNITY): Payer: BC Managed Care – PPO | Admitting: Anesthesiology

## 2014-01-13 ENCOUNTER — Inpatient Hospital Stay (HOSPITAL_COMMUNITY)
Admission: RE | Admit: 2014-01-13 | Discharge: 2014-01-14 | DRG: 470 | Disposition: A | Discharge status: Home Health | Payer: BC Managed Care – PPO | Source: Ambulatory Visit | Attending: Orthopedic Surgery | Admitting: Orthopedic Surgery

## 2014-01-13 ENCOUNTER — Encounter (HOSPITAL_COMMUNITY): Payer: Self-pay | Admitting: *Deleted

## 2014-01-13 ENCOUNTER — Encounter (HOSPITAL_COMMUNITY)
Admission: RE | Disposition: A | Discharge status: Home Health | Payer: Self-pay | Source: Ambulatory Visit | Attending: Orthopedic Surgery

## 2014-01-13 DIAGNOSIS — M25761 Osteophyte, right knee: Secondary | ICD-10-CM | POA: Diagnosis present

## 2014-01-13 DIAGNOSIS — Z809 Family history of malignant neoplasm, unspecified: Secondary | ICD-10-CM

## 2014-01-13 DIAGNOSIS — K589 Irritable bowel syndrome without diarrhea: Secondary | ICD-10-CM | POA: Diagnosis present

## 2014-01-13 DIAGNOSIS — Z888 Allergy status to other drugs, medicaments and biological substances status: Secondary | ICD-10-CM

## 2014-01-13 DIAGNOSIS — M1711 Unilateral primary osteoarthritis, right knee: Secondary | ICD-10-CM | POA: Diagnosis present

## 2014-01-13 DIAGNOSIS — K219 Gastro-esophageal reflux disease without esophagitis: Secondary | ICD-10-CM | POA: Diagnosis present

## 2014-01-13 DIAGNOSIS — E039 Hypothyroidism, unspecified: Secondary | ICD-10-CM | POA: Diagnosis present

## 2014-01-13 DIAGNOSIS — K861 Other chronic pancreatitis: Secondary | ICD-10-CM | POA: Diagnosis present

## 2014-01-13 DIAGNOSIS — Z90710 Acquired absence of both cervix and uterus: Secondary | ICD-10-CM | POA: Diagnosis not present

## 2014-01-13 DIAGNOSIS — M659 Synovitis and tenosynovitis, unspecified: Secondary | ICD-10-CM | POA: Diagnosis present

## 2014-01-13 DIAGNOSIS — Z96651 Presence of right artificial knee joint: Secondary | ICD-10-CM

## 2014-01-13 DIAGNOSIS — Z9049 Acquired absence of other specified parts of digestive tract: Secondary | ICD-10-CM | POA: Diagnosis present

## 2014-01-13 DIAGNOSIS — Z6841 Body Mass Index (BMI) 40.0 and over, adult: Secondary | ICD-10-CM

## 2014-01-13 DIAGNOSIS — F329 Major depressive disorder, single episode, unspecified: Secondary | ICD-10-CM | POA: Diagnosis present

## 2014-01-13 DIAGNOSIS — Z885 Allergy status to narcotic agent status: Secondary | ICD-10-CM | POA: Diagnosis not present

## 2014-01-13 DIAGNOSIS — Z96659 Presence of unspecified artificial knee joint: Secondary | ICD-10-CM

## 2014-01-13 DIAGNOSIS — Z01812 Encounter for preprocedural laboratory examination: Secondary | ICD-10-CM

## 2014-01-13 DIAGNOSIS — M25461 Effusion, right knee: Secondary | ICD-10-CM | POA: Diagnosis present

## 2014-01-13 DIAGNOSIS — M25561 Pain in right knee: Secondary | ICD-10-CM | POA: Diagnosis present

## 2014-01-13 HISTORY — PX: TOTAL KNEE ARTHROPLASTY: SHX125

## 2014-01-13 LAB — TYPE AND SCREEN
ABO/RH(D): B POS
ANTIBODY SCREEN: NEGATIVE

## 2014-01-13 SURGERY — ARTHROPLASTY, KNEE, TOTAL
Anesthesia: Spinal | Site: Knee | Laterality: Right

## 2014-01-13 MED ORDER — PROPOFOL INFUSION 10 MG/ML OPTIME
INTRAVENOUS | Status: DC | PRN
  Administered 2014-01-13: 50 ug/kg/min via INTRAVENOUS

## 2014-01-13 MED ORDER — METHOCARBAMOL 1000 MG/10ML IJ SOLN
500.0000 mg | Freq: Four times a day (QID) | INTRAVENOUS | Status: DC | PRN
  Administered 2014-01-13: 500 mg via INTRAVENOUS
  Filled 2014-01-13 (×2): qty 5

## 2014-01-13 MED ORDER — ONDANSETRON HCL 4 MG PO TABS: 4.0000 mg | ORAL_TABLET | Freq: Four times a day (QID) | ORAL | Status: DC | PRN

## 2014-01-13 MED ORDER — HYDROCORTISONE ACE-PRAMOXINE 1-1 % RE FOAM
1.0000 | Freq: Two times a day (BID) | RECTAL | Status: DC | PRN
  Filled 2014-01-13: qty 10

## 2014-01-13 MED ORDER — ONDANSETRON HCL 4 MG/2ML IJ SOLN
INTRAMUSCULAR | Status: AC
  Filled 2014-01-13: qty 2

## 2014-01-13 MED ORDER — SODIUM CHLORIDE 0.9 % IV SOLN: INTRAVENOUS | Status: DC

## 2014-01-13 MED ORDER — BUPIVACAINE IN DEXTROSE 0.75-8.25 % IT SOLN
INTRATHECAL | Status: DC | PRN
  Administered 2014-01-13: 2 mL via INTRATHECAL

## 2014-01-13 MED ORDER — 0.9 % SODIUM CHLORIDE (POUR BTL) OPTIME
TOPICAL | Status: DC | PRN
  Administered 2014-01-13: 1000 mL

## 2014-01-13 MED ORDER — POLYETHYLENE GLYCOL 3350 17 G PO PACK
17.0000 g | PACK | Freq: Two times a day (BID) | ORAL | Status: DC
  Administered 2014-01-14: 17 g via ORAL

## 2014-01-13 MED ORDER — BUPROPION HCL ER (XL) 300 MG PO TB24
300.0000 mg | ORAL_TABLET | Freq: Every day | ORAL | Status: DC
  Administered 2014-01-14: 300 mg via ORAL
  Filled 2014-01-13: qty 1

## 2014-01-13 MED ORDER — SODIUM CHLORIDE 0.9 % IJ SOLN
INTRAMUSCULAR | Status: AC
  Filled 2014-01-13: qty 50

## 2014-01-13 MED ORDER — PHENOL 1.4 % MT LIQD
1.0000 | OROMUCOSAL | Status: DC | PRN
  Filled 2014-01-13: qty 177

## 2014-01-13 MED ORDER — SODIUM CHLORIDE 0.9 % IV SOLN
INTRAVENOUS | Status: DC
  Administered 2014-01-13: 21:00:00 via INTRAVENOUS
  Filled 2014-01-13 (×4): qty 1000

## 2014-01-13 MED ORDER — METOCLOPRAMIDE HCL 10 MG PO TABS: 5.0000 mg | ORAL_TABLET | Freq: Three times a day (TID) | ORAL | Status: DC | PRN

## 2014-01-13 MED ORDER — CELECOXIB 200 MG PO CAPS
200.0000 mg | ORAL_CAPSULE | Freq: Two times a day (BID) | ORAL | Status: DC
  Administered 2014-01-13: 200 mg via ORAL
  Filled 2014-01-13 (×3): qty 1

## 2014-01-13 MED ORDER — METHOCARBAMOL 500 MG PO TABS
500.0000 mg | ORAL_TABLET | Freq: Four times a day (QID) | ORAL | Status: DC | PRN
  Administered 2014-01-14 (×2): 500 mg via ORAL
  Filled 2014-01-13 (×3): qty 1

## 2014-01-13 MED ORDER — CEFAZOLIN SODIUM-DEXTROSE 2-3 GM-% IV SOLR
2.0000 g | INTRAVENOUS | Status: AC
  Administered 2014-01-13: 2 g via INTRAVENOUS

## 2014-01-13 MED ORDER — TRANEXAMIC ACID 100 MG/ML IV SOLN
1000.0000 mg | Freq: Once | INTRAVENOUS | Status: AC
  Administered 2014-01-13: 1000 mg via INTRAVENOUS
  Filled 2014-01-13: qty 10

## 2014-01-13 MED ORDER — CEFAZOLIN SODIUM-DEXTROSE 2-3 GM-% IV SOLR
INTRAVENOUS | Status: AC
  Filled 2014-01-13: qty 50

## 2014-01-13 MED ORDER — FENTANYL CITRATE 0.05 MG/ML IJ SOLN
INTRAMUSCULAR | Status: AC
  Filled 2014-01-13: qty 2

## 2014-01-13 MED ORDER — ALPRAZOLAM 1 MG PO TABS
1.0000 mg | ORAL_TABLET | Freq: Three times a day (TID) | ORAL | Status: DC
  Administered 2014-01-13 – 2014-01-14 (×3): 1 mg via ORAL
  Filled 2014-01-13 (×3): qty 1

## 2014-01-13 MED ORDER — ONDANSETRON HCL 4 MG/2ML IJ SOLN
INTRAMUSCULAR | Status: DC | PRN
Start: 2014-01-13 — End: 2014-01-13
  Administered 2014-01-13: 4 mg via INTRAVENOUS

## 2014-01-13 MED ORDER — DOCUSATE SODIUM 100 MG PO CAPS
100.0000 mg | ORAL_CAPSULE | Freq: Two times a day (BID) | ORAL | Status: DC
  Administered 2014-01-13 – 2014-01-14 (×2): 100 mg via ORAL

## 2014-01-13 MED ORDER — MIDAZOLAM HCL 2 MG/2ML IJ SOLN
INTRAMUSCULAR | Status: AC
  Filled 2014-01-13: qty 2

## 2014-01-13 MED ORDER — PROMETHAZINE HCL 25 MG PO TABS: 25.0000 mg | ORAL_TABLET | Freq: Four times a day (QID) | ORAL | Status: DC | PRN

## 2014-01-13 MED ORDER — FERROUS SULFATE 325 (65 FE) MG PO TABS
325.0000 mg | ORAL_TABLET | Freq: Three times a day (TID) | ORAL | Status: DC
  Administered 2014-01-13: 325 mg via ORAL
  Filled 2014-01-13 (×5): qty 1

## 2014-01-13 MED ORDER — FLUTICASONE PROPIONATE HFA 44 MCG/ACT IN AERO
2.0000 | INHALATION_SPRAY | Freq: Two times a day (BID) | RESPIRATORY_TRACT | Status: DC
  Administered 2014-01-13 – 2014-01-14 (×2): 2 via RESPIRATORY_TRACT
  Filled 2014-01-13: qty 10.6

## 2014-01-13 MED ORDER — LEVOTHYROXINE SODIUM 125 MCG PO TABS
125.0000 ug | ORAL_TABLET | Freq: Every day | ORAL | Status: DC
  Administered 2014-01-14: 125 ug via ORAL
  Filled 2014-01-13 (×2): qty 1

## 2014-01-13 MED ORDER — MENTHOL 3 MG MT LOZG
1.0000 | LOZENGE | OROMUCOSAL | Status: DC | PRN
  Filled 2014-01-13: qty 9

## 2014-01-13 MED ORDER — LIDOCAINE HCL (CARDIAC) 20 MG/ML IV SOLN
INTRAVENOUS | Status: DC | PRN
  Administered 2014-01-13: 50 mg via INTRAVENOUS

## 2014-01-13 MED ORDER — FENTANYL CITRATE 0.05 MG/ML IJ SOLN
INTRAMUSCULAR | Status: DC | PRN
  Administered 2014-01-13: 50 ug via INTRAVENOUS

## 2014-01-13 MED ORDER — SODIUM CHLORIDE 0.9 % IJ SOLN
INTRAMUSCULAR | Status: DC | PRN
  Administered 2014-01-13: 30 mL

## 2014-01-13 MED ORDER — KETOROLAC TROMETHAMINE 30 MG/ML IJ SOLN
INTRAMUSCULAR | Status: DC | PRN
  Administered 2014-01-13: 30 mg

## 2014-01-13 MED ORDER — DEXAMETHASONE SODIUM PHOSPHATE 10 MG/ML IJ SOLN
INTRAMUSCULAR | Status: AC
  Filled 2014-01-13: qty 1

## 2014-01-13 MED ORDER — DULOXETINE HCL 30 MG PO CPEP
30.0000 mg | ORAL_CAPSULE | Freq: Three times a day (TID) | ORAL | Status: DC
  Administered 2014-01-13 – 2014-01-14 (×3): 30 mg via ORAL
  Filled 2014-01-13 (×5): qty 1

## 2014-01-13 MED ORDER — POTASSIUM CITRATE ER 10 MEQ (1080 MG) PO TBCR
10.0000 meq | EXTENDED_RELEASE_TABLET | Freq: Every day | ORAL | Status: DC
  Filled 2014-01-13: qty 1

## 2014-01-13 MED ORDER — ONDANSETRON HCL 4 MG/2ML IJ SOLN: 4.0000 mg | Freq: Four times a day (QID) | INTRAMUSCULAR | Status: DC | PRN

## 2014-01-13 MED ORDER — SODIUM CHLORIDE 0.9 % IV SOLN
1500.0000 mg | Freq: Once | INTRAVENOUS | Status: AC
  Administered 2014-01-13: 1500 mg via INTRAVENOUS
  Filled 2014-01-13: qty 1500

## 2014-01-13 MED ORDER — ALUM & MAG HYDROXIDE-SIMETH 200-200-20 MG/5ML PO SUSP: 30.0000 mL | ORAL | Status: DC | PRN

## 2014-01-13 MED ORDER — LIP MEDEX EX OINT
TOPICAL_OINTMENT | CUTANEOUS | Status: AC
  Filled 2014-01-13: qty 7

## 2014-01-13 MED ORDER — PREGABALIN 100 MG PO CAPS
300.0000 mg | ORAL_CAPSULE | Freq: Two times a day (BID) | ORAL | Status: DC
  Administered 2014-01-13 – 2014-01-14 (×2): 300 mg via ORAL
  Filled 2014-01-13 (×2): qty 3

## 2014-01-13 MED ORDER — BUPIVACAINE-EPINEPHRINE (PF) 0.25% -1:200000 IJ SOLN
INTRAMUSCULAR | Status: DC | PRN
  Administered 2014-01-13: 30 mL

## 2014-01-13 MED ORDER — KETOROLAC TROMETHAMINE 30 MG/ML IJ SOLN
INTRAMUSCULAR | Status: AC
  Filled 2014-01-13: qty 1

## 2014-01-13 MED ORDER — TAMSULOSIN HCL 0.4 MG PO CAPS
0.4000 mg | ORAL_CAPSULE | Freq: Every day | ORAL | Status: DC | PRN
  Filled 2014-01-13: qty 1

## 2014-01-13 MED ORDER — PROPOFOL 10 MG/ML IV BOLUS
INTRAVENOUS | Status: AC
  Filled 2014-01-13: qty 20

## 2014-01-13 MED ORDER — ASPIRIN EC 325 MG PO TBEC
325.0000 mg | DELAYED_RELEASE_TABLET | Freq: Two times a day (BID) | ORAL | Status: DC
  Administered 2014-01-14: 325 mg via ORAL
  Filled 2014-01-13 (×3): qty 1

## 2014-01-13 MED ORDER — SALINE SPRAY 0.65 % NA SOLN
1.0000 | Freq: Every day | NASAL | Status: DC | PRN
  Filled 2014-01-13: qty 44

## 2014-01-13 MED ORDER — MORPHINE SULFATE 2 MG/ML IJ SOLN
1.0000 mg | INTRAMUSCULAR | Status: DC | PRN
  Administered 2014-01-13 – 2014-01-14 (×2): 1 mg via INTRAVENOUS
  Administered 2014-01-14: 2 mg via INTRAVENOUS
  Filled 2014-01-13 (×3): qty 1

## 2014-01-13 MED ORDER — OXYCODONE HCL 5 MG PO TABS
10.0000 mg | ORAL_TABLET | ORAL | Status: DC
  Administered 2014-01-13 (×2): 10 mg via ORAL
  Administered 2014-01-14: 20 mg via ORAL
  Administered 2014-01-14 (×2): 15 mg via ORAL
  Administered 2014-01-14: 10 mg via ORAL
  Filled 2014-01-13: qty 4
  Filled 2014-01-13: qty 2
  Filled 2014-01-13: qty 4
  Filled 2014-01-13 (×2): qty 2
  Filled 2014-01-13: qty 3

## 2014-01-13 MED ORDER — FLUTICASONE PROPIONATE 50 MCG/ACT NA SUSP
2.0000 | Freq: Every day | NASAL | Status: DC | PRN
  Filled 2014-01-13: qty 16

## 2014-01-13 MED ORDER — BISACODYL 10 MG RE SUPP: 10.0000 mg | Freq: Every day | RECTAL | Status: DC | PRN

## 2014-01-13 MED ORDER — CHLORHEXIDINE GLUCONATE 4 % EX LIQD: 60.0000 mL | Freq: Once | CUTANEOUS | Status: DC

## 2014-01-13 MED ORDER — CEFAZOLIN SODIUM-DEXTROSE 2-3 GM-% IV SOLR
2.0000 g | Freq: Four times a day (QID) | INTRAVENOUS | Status: AC
  Administered 2014-01-13 – 2014-01-14 (×2): 2 g via INTRAVENOUS
  Filled 2014-01-13 (×2): qty 50

## 2014-01-13 MED ORDER — METOCLOPRAMIDE HCL 5 MG/ML IJ SOLN: 5.0000 mg | Freq: Three times a day (TID) | INTRAMUSCULAR | Status: DC | PRN

## 2014-01-13 MED ORDER — ONDANSETRON HCL 4 MG/2ML IJ SOLN: 4.0000 mg | Freq: Once | INTRAMUSCULAR | Status: DC | PRN

## 2014-01-13 MED ORDER — ALBUTEROL SULFATE (2.5 MG/3ML) 0.083% IN NEBU: 2.5000 mg | INHALATION_SOLUTION | Freq: Four times a day (QID) | RESPIRATORY_TRACT | Status: DC | PRN

## 2014-01-13 MED ORDER — LACTATED RINGERS IV SOLN
INTRAVENOUS | Status: DC
  Administered 2014-01-13: 14:00:00 via INTRAVENOUS
  Administered 2014-01-13: 1000 mL via INTRAVENOUS

## 2014-01-13 MED ORDER — SODIUM CHLORIDE 0.9 % IR SOLN
Status: DC | PRN
  Administered 2014-01-13: 1000 mL

## 2014-01-13 MED ORDER — DIPHENHYDRAMINE HCL 25 MG PO CAPS: 25.0000 mg | ORAL_CAPSULE | Freq: Four times a day (QID) | ORAL | Status: DC | PRN

## 2014-01-13 MED ORDER — HYDROCHLOROTHIAZIDE 25 MG PO TABS
25.0000 mg | ORAL_TABLET | Freq: Every day | ORAL | Status: DC
  Filled 2014-01-13: qty 1

## 2014-01-13 MED ORDER — MIDAZOLAM HCL 5 MG/5ML IJ SOLN
INTRAMUSCULAR | Status: DC | PRN
  Administered 2014-01-13 (×2): 1 mg via INTRAVENOUS
  Administered 2014-01-13: 2 mg via INTRAVENOUS

## 2014-01-13 MED ORDER — FENTANYL CITRATE 0.05 MG/ML IJ SOLN: 25.0000 ug | INTRAMUSCULAR | Status: DC | PRN

## 2014-01-13 MED ORDER — MAGNESIUM CITRATE PO SOLN: 1.0000 | Freq: Once | ORAL | Status: AC | PRN

## 2014-01-13 MED ORDER — BUPIVACAINE-EPINEPHRINE (PF) 0.25% -1:200000 IJ SOLN
INTRAMUSCULAR | Status: AC
  Filled 2014-01-13: qty 30

## 2014-01-13 MED ORDER — SODIUM CHLORIDE 0.9 % IV SOLN
INTRAVENOUS | Status: DC | PRN
  Administered 2014-01-13: 13:00:00 via INTRAVENOUS

## 2014-01-13 SURGICAL SUPPLY — 60 items
BAG SPEC THK2 15X12 ZIP CLS (MISCELLANEOUS)
BAG ZIPLOCK 12X15 (MISCELLANEOUS) IMPLANT
BANDAGE ELASTIC 6 VELCRO ST LF (GAUZE/BANDAGES/DRESSINGS) ×3 IMPLANT
BANDAGE ESMARK 6X9 LF (GAUZE/BANDAGES/DRESSINGS) ×1 IMPLANT
BLADE SAW SGTL 13.0X1.19X90.0M (BLADE) ×3 IMPLANT
BNDG CMPR 9X6 STRL LF SNTH (GAUZE/BANDAGES/DRESSINGS) ×1
BNDG ESMARK 6X9 LF (GAUZE/BANDAGES/DRESSINGS) ×3
BOWL SMART MIX CTS (DISPOSABLE) ×3 IMPLANT
CAP KNEE ATTUNE RP ×2 IMPLANT
CEMENT HV SMART SET (Cement) ×4 IMPLANT
CUFF TOURN SGL QUICK 34 (TOURNIQUET CUFF) ×3
CUFF TRNQT CYL 34X4X40X1 (TOURNIQUET CUFF) ×1 IMPLANT
DECANTER SPIKE VIAL GLASS SM (MISCELLANEOUS) ×3 IMPLANT
DRAPE EXTREMITY TIBURON (DRAPES) ×3 IMPLANT
DRAPE POUCH INSTRU U-SHP 10X18 (DRAPES) ×3 IMPLANT
DRAPE U-SHAPE 47X51 STRL (DRAPES) ×3 IMPLANT
DRSG AQUACEL AG ADV 3.5X10 (GAUZE/BANDAGES/DRESSINGS) ×3 IMPLANT
DRSG TEGADERM 4X4.75 (GAUZE/BANDAGES/DRESSINGS) ×3 IMPLANT
DURAPREP 26ML APPLICATOR (WOUND CARE) ×6 IMPLANT
ELECT REM PT RETURN 9FT ADLT (ELECTROSURGICAL) ×3
ELECTRODE REM PT RTRN 9FT ADLT (ELECTROSURGICAL) ×1 IMPLANT
FACESHIELD WRAPAROUND (MASK) ×12 IMPLANT
FACESHIELD WRAPAROUND OR TEAM (MASK) ×5 IMPLANT
GLOVE BIO SURGEON STRL SZ7 (GLOVE) ×2 IMPLANT
GLOVE BIOGEL PI IND STRL 6.5 (GLOVE) IMPLANT
GLOVE BIOGEL PI IND STRL 7.0 (GLOVE) IMPLANT
GLOVE BIOGEL PI IND STRL 7.5 (GLOVE) ×1 IMPLANT
GLOVE BIOGEL PI IND STRL 8.5 (GLOVE) ×1 IMPLANT
GLOVE BIOGEL PI INDICATOR 6.5 (GLOVE) ×4
GLOVE BIOGEL PI INDICATOR 7.0 (GLOVE) ×2
GLOVE BIOGEL PI INDICATOR 7.5 (GLOVE) ×2
GLOVE BIOGEL PI INDICATOR 8.5 (GLOVE) ×2
GLOVE ECLIPSE 8.0 STRL XLNG CF (GLOVE) ×3 IMPLANT
GLOVE ORTHO TXT STRL SZ7.5 (GLOVE) ×6 IMPLANT
GLOVE SURG SS PI 7.0 STRL IVOR (GLOVE) ×3 IMPLANT
GOWN SPEC L3 XXLG W/TWL (GOWN DISPOSABLE) ×3 IMPLANT
GOWN STRL REUS W/TWL LRG LVL3 (GOWN DISPOSABLE) ×6 IMPLANT
GOWN STRL REUS W/TWL XL LVL3 (GOWN DISPOSABLE) ×2 IMPLANT
HANDPIECE INTERPULSE COAX TIP (DISPOSABLE) ×3
KIT BASIN OR (CUSTOM PROCEDURE TRAY) ×3 IMPLANT
LIQUID BAND (GAUZE/BANDAGES/DRESSINGS) ×3 IMPLANT
MANIFOLD NEPTUNE II (INSTRUMENTS) ×3 IMPLANT
NDL SAFETY ECLIPSE 18X1.5 (NEEDLE) ×1 IMPLANT
NEEDLE HYPO 18GX1.5 SHARP (NEEDLE) ×3
PACK TOTAL JOINT (CUSTOM PROCEDURE TRAY) ×3 IMPLANT
POSITIONER SURGICAL ARM (MISCELLANEOUS) ×3 IMPLANT
SET HNDPC FAN SPRY TIP SCT (DISPOSABLE) ×1 IMPLANT
SET PAD KNEE POSITIONER (MISCELLANEOUS) ×3 IMPLANT
SUCTION FRAZIER 12FR DISP (SUCTIONS) ×3 IMPLANT
SUT MNCRL AB 4-0 PS2 18 (SUTURE) ×3 IMPLANT
SUT VIC AB 1 CT1 36 (SUTURE) ×3 IMPLANT
SUT VIC AB 2-0 CT1 27 (SUTURE) ×9
SUT VIC AB 2-0 CT1 TAPERPNT 27 (SUTURE) ×3 IMPLANT
SUT VLOC 180 0 24IN GS25 (SUTURE) ×3 IMPLANT
SYR 50ML LL SCALE MARK (SYRINGE) ×3 IMPLANT
TOWEL OR 17X26 10 PK STRL BLUE (TOWEL DISPOSABLE) ×3 IMPLANT
TOWEL OR NON WOVEN STRL DISP B (DISPOSABLE) ×2 IMPLANT
TRAY FOLEY CATH 14FRSI W/METER (CATHETERS) ×3 IMPLANT
WATER STERILE IRR 1500ML POUR (IV SOLUTION) ×3 IMPLANT
WRAP KNEE MAXI GEL POST OP (GAUZE/BANDAGES/DRESSINGS) ×3 IMPLANT

## 2014-01-13 NOTE — Transfer of Care (Signed)
Immediate Anesthesia Transfer of Care Note  Patient: [name]  Procedure(s) Performed: Procedure(s) (LRB): RIGHT TOTAL KNEE ARTHROPLASTY (Right)  Patient Location: PACU  Anesthesia Type: Spinal  Level of Consciousness: sedated, patient cooperative and responds to stimulation  Airway & Oxygen Therapy: Patient Spontanous Breathing and Patient connected to face mask oxgen  Post-op Assessment: Report given to PACU RN and Post -op Vital signs reviewed and stable  Post vital signs: Reviewed and stable  Complications: No apparent anesthesia complications

## 2014-01-13 NOTE — Op Note (Signed)
NAME:  [name]                      MEDICAL RECORD NO.:  [id number]                             FACILITY:  [facility]      PHYSICIAN:  Madlyn FrankelMatthew D. Charlann Boxerlin, M.D.  DATE OF BIRTH:  1965/07/09      DATE OF PROCEDURE:  01/13/2014                                     OPERATIVE REPORT         PREOPERATIVE DIAGNOSIS:  Right knee osteoarthritis.      POSTOPERATIVE DIAGNOSIS:  Right knee osteoarthritis.      FINDINGS:  The patient was noted to have complete loss of cartilage and   bone-on-bone arthritis with associated osteophytes in the lateral and patellofemoral compartments of   the knee with a significant synovitis and associated effusion.      PROCEDURE:  Right total knee replacement.      COMPONENTS USED:  DePuy Atuune rotating platform posterior stabilized knee   system, a size 4 femur, 5 tibia, size 10 mm insert, and 32 Anatomic patellar   button.      SURGEON:  Madlyn FrankelMatthew D. Charlann Boxerlin, M.D.      ASSISTANT:  Lanney GinsMatthew Babish, PA-C.      ANESTHESIA:  Spinal.      SPECIMENS:  None.      COMPLICATION:  None.      DRAINS:  None  EBL: <100cc      TOURNIQUET TIME:   Total Tourniquet Time Documented: Thigh (Right) - 35 minutes Total: Thigh (Right) - 35 minutes  .      The patient was stable to the recovery room.      INDICATION FOR PROCEDURE:  [name] is a 48 y.o. female patient of   mine.  The patient had been seen, evaluated, and treated conservatively in the   office with medication, activity modification, and injections.  The patient had   radiographic changes of bone-on-bone arthritis with endplate sclerosis and osteophytes noted.      The patient failed conservative measures including medication, injections, and activity modification, and at this point was ready for more definitive measures.   Based on the radiographic changes and failed conservative measures, the patient   decided to proceed with total knee replacement.  Risks of infection,   DVT, component failure,  need for revision surgery, postop course, and   expectations were all   discussed and reviewed.  Consent was obtained for benefit of pain   relief.      PROCEDURE IN DETAIL:  The patient was brought to the operative theater.   Once adequate anesthesia, preoperative antibiotics, 2 gm of Ancef and 1.5gm of Vancomycin (due to MRSA positive screen) administered, the patient was positioned supine with the right thigh tourniquet placed.  The  right lower extremity was prepped and draped in sterile fashion.  A time-   out was performed identifying the patient, planned procedure, and   extremity.      The right lower extremity was placed in the [facility] leg holder.  The leg was   exsanguinated, tourniquet elevated to 250 mmHg.  A midline incision was   made followed by median  parapatellar arthrotomy.  Following initial   exposure, attention was first directed to the patella.  Precut   measurement was noted to be 21 mm.  I resected down to 14 mm and used a   32 patellar button to restore patellar height as well as cover the cut   surface.      The lug holes were drilled and a metal shim was placed to protect the   patella from retractors and saw blades.      At this point, attention was now directed to the femur.  The femoral   canal was opened with a drill, irrigated to try to prevent fat emboli.  An   intramedullary rod was passed at 5 degrees valgus, 9 mm of bone was   resected off the distal femur.  Following this resection, the tibia was   subluxated anteriorly.  Using the extramedullary guide, 4 mm of bone was resected off   the proximal lateral tibia.  We confirmed the gap would be   stable medially and laterally with a size 7 insert as well as confirmed   the cut was perpendicular in the coronal plane, checking with an alignment rod.      Once this was done, I sized the femur to be a size 4 in the anterior-   posterior dimension, chose a standard component based on medial and   lateral  dimension.  The size 4 rotation block was then pinned in   position anterior referenced using the tensioning device to set rotation and best match flexion and extension gaps. The   anterior, posterior, and  chamfer cuts were made without difficulty nor   notching making certain that I was along the anterior cortex to help   with flexion gap stability.      The final box cut was made off the lateral aspect of distal femur.      At this point, the tibia was sized to be a size 5, the size 5 tray was   then pinned in position through the medial third of the tubercle,   drilled, and keel punched.  Trial reduction was now carried with a 4 femur,  5 tibia, a 8 mm insert, and the 32 patella botton.  The knee was brought to   extension, full extension with good flexion stability with the patella   tracking through the trochlea without application of pressure.  Given   all these findings, the trial components removed.  Final components were   opened and cement was mixed.  The knee was irrigated with normal saline   solution and pulse lavage.  The synovial lining was   then injected with 30cc of 0.25% Marcaine with epinephrine and 1 cc of Toradol plus 30cc,   total of 61 cc.      The knee was irrigated.  Final implants were then cemented onto clean and   dried cut surfaces of bone with the knee brought to extension with a 10 mm trial insert.      Once the cement had fully cured, the excess cement was removed   throughout the knee.  I confirmed I was satisfied with the range of   motion and stability, and the final 10 mm AOX PS insert was chosen.  It was   placed into the knee.      The tourniquet had been let down at 34 minutes.  No significant   hemostasis required.  The   extensor mechanism was then  reapproximated using #1 Vicryl with the knee   in flexion.  The   remaining wound was closed with 2-0 Vicryl and running 4-0 Monocryl.   The knee was cleaned, dried, dressed sterilely using  Dermabond and   Aquacel dressing.  The patient was then   brought to recovery room in stable condition, tolerating the procedure   well.   Please note that Physician Assistant, Lanney GinsMatthew Babish, PA-C, was present for the entirety of the case, and was utilized for pre-operative positioning, peri-operative retractor management, general facilitation of the procedure.  He was also utilized for primary wound closure at the end of the case.              Madlyn FrankelMatthew D. Charlann Boxerlin, M.D.    01/13/2014 2:24 PM

## 2014-01-13 NOTE — Anesthesia Procedure Notes (Signed)
Spinal Patient location during procedure: OR Start time: 01/13/2014 12:50 PM End time: 01/13/2014 1:00 PM Staffing Anesthesiologist: ,  JENNETTE Performed by: anesthesiologist  Preanesthetic Checklist Completed: patient identified, site marked, surgical consent, pre-op evaluation, timeout performed, IV checked, risks and benefits discussed and monitors and equipment checked Spinal Block Patient position: sitting Prep: Betadine Patient monitoring: heart rate, continuous pulse ox and blood pressure Location: L3-4 Injection technique: single-shot Needle Needle type: Sprotte  Needle gauge: 24 G Needle length: 9 cm Assessment Sensory level: T6 Additional Notes Expiration date of kit checked and confirmed. Patient tolerated procedure well, without complications. Took several attempts at both L3/4 and L4/5. Attempted midline and paramedian. Long spotte required and with loss had no parathesia, no heme, and CSF return     

## 2014-01-13 NOTE — Interval H&P Note (Signed)
History and Physical Interval Note:  01/13/2014 11:26 AM  [name]  has presented today for surgery, with the diagnosis of RIGHT KNEE OA   The various methods of treatment have been discussed with the patient and family. After consideration of risks, benefits and other options for treatment, the patient has consented to  Procedure(s): RIGHT TOTAL KNEE ARTHROPLASTY (Right) as a surgical intervention .  The patient's history has been reviewed, patient examined, no change in status, stable for surgery.  I have reviewed the patient's chart and labs.  Questions were answered to the patient's satisfaction.     Shelda PalLIN,Everrett Lacasse D

## 2014-01-13 NOTE — Anesthesia Postprocedure Evaluation (Signed)
  Anesthesia Post-op Note  Patient: [name]  Procedure(s) Performed: Procedure(s) (LRB): RIGHT TOTAL KNEE ARTHROPLASTY (Right)  Patient Location: PACU  Anesthesia Type: Spinal  Level of Consciousness: awake and alert   Airway and Oxygen Therapy: Patient Spontanous Breathing  Post-op Pain: mild  Post-op Assessment: Post-op Vital signs reviewed, Patient's Cardiovascular Status Stable, Respiratory Function Stable, Patent Airway and No signs of Nausea or vomiting  Last Vitals:  Filed Vitals:   01/13/14 1615  BP: 128/81  Pulse: 73  Temp:   Resp: 21    Post-op Vital Signs: stable   Complications: No apparent anesthesia complications

## 2014-01-14 ENCOUNTER — Encounter (HOSPITAL_COMMUNITY): Payer: Self-pay | Admitting: Orthopedic Surgery

## 2014-01-14 LAB — BASIC METABOLIC PANEL
Anion gap: 14 (ref 5–15)
BUN: 15 mg/dL (ref 6–23)
CALCIUM: 9 mg/dL (ref 8.4–10.5)
CO2: 26 mEq/L (ref 19–32)
Chloride: 103 mEq/L (ref 96–112)
Creatinine, Ser: 0.87 mg/dL (ref 0.50–1.10)
GFR calc non Af Amer: 78 mL/min — ABNORMAL LOW (ref >90)
GFR, EST AFRICAN AMERICAN: 90 mL/min — AB (ref >90)
GLUCOSE: 152 mg/dL — AB (ref 70–99)
POTASSIUM: 4.1 meq/L (ref 3.7–5.3)
Sodium: 143 mEq/L (ref 137–147)

## 2014-01-14 LAB — CBC
HCT: 37.6 % (ref 36.0–46.0)
Hemoglobin: 11.6 g/dL — ABNORMAL LOW (ref 12.0–15.0)
MCH: 26.2 pg (ref 26.0–34.0)
MCHC: 30.9 g/dL (ref 30.0–36.0)
MCV: 85.1 fL (ref 78.0–100.0)
Platelets: 248 10*3/uL (ref 150–400)
RBC: 4.42 MIL/uL (ref 3.87–5.11)
RDW: 15.4 % (ref 11.5–15.5)
WBC: 10 10*3/uL (ref 4.0–10.5)

## 2014-01-14 MED ORDER — ASPIRIN 325 MG PO TBEC: 325.0000 mg | DELAYED_RELEASE_TABLET | Freq: Two times a day (BID) | ORAL | Status: AC

## 2014-01-14 MED ORDER — METHOCARBAMOL 500 MG PO TABS: 500.0000 mg | ORAL_TABLET | Freq: Four times a day (QID) | ORAL | Status: DC | PRN

## 2014-01-14 MED ORDER — MUPIROCIN 2 % EX OINT
1.0000 "application " | TOPICAL_OINTMENT | Freq: Two times a day (BID) | CUTANEOUS | Status: DC
  Administered 2014-01-14 (×2): 1 via NASAL
  Filled 2014-01-14: qty 22

## 2014-01-14 MED ORDER — OXYCODONE HCL 10 MG PO TABS: 10.0000 mg | ORAL_TABLET | ORAL | Status: DC | PRN

## 2014-01-14 MED ORDER — FERROUS SULFATE 325 (65 FE) MG PO TABS: 325.0000 mg | ORAL_TABLET | Freq: Three times a day (TID) | ORAL | Status: DC

## 2014-01-14 MED ORDER — KETOROLAC TROMETHAMINE 15 MG/ML IJ SOLN
15.0000 mg | Freq: Once | INTRAMUSCULAR | Status: AC
  Administered 2014-01-14: 15 mg via INTRAVENOUS
  Filled 2014-01-14: qty 1

## 2014-01-14 MED ORDER — CHLORHEXIDINE GLUCONATE CLOTH 2 % EX PADS
6.0000 | MEDICATED_PAD | Freq: Every day | CUTANEOUS | Status: DC
  Administered 2014-01-14: 6 via TOPICAL

## 2014-01-14 MED ORDER — DSS 100 MG PO CAPS: 100.0000 mg | ORAL_CAPSULE | Freq: Two times a day (BID) | ORAL | Status: DC

## 2014-01-14 NOTE — Evaluation (Signed)
Occupational Therapy Evaluation Patient Details Name: Roger KillWendy W Cilia MRN: [id number] DOB: 1965/04/30 Today's Date: 01/14/2014    History of Present Illness R TKR   Clinical Impression   This 48 year old female was admitted for the above surgery. All education was completed.  No further OT is needed at this time.  Pt has all DME at home.    Follow Up Recommendations  No OT follow up    Equipment Recommendations  None recommended by OT    Recommendations for Other Services       Precautions / Restrictions Precautions Precautions: Knee;Fall Restrictions Weight Bearing Restrictions: No Other Position/Activity Restrictions: WBAT      Mobility Bed Mobility              Transfers Overall transfer level: Needs assistance Equipment used: Rolling walker (2 wheeled) Transfers: Sit to/from Stand Sit to Stand: Min assist         General transfer comment: cues for LE management and use of UEs to self assist    Balance                                            ADL Overall ADL's : Needs assistance/impaired                         Toilet Transfer: Minimal assistance;Ambulation;BSC.  Brought recliner to bathroom door--BSC did not have bucket and pt was limited by pain.   Toileting- ArchitectClothing Manipulation and Hygiene: Min guard;Sit to/from stand         General ADL Comments: Pt is able to perform UB adls with set up and she has AE from previous sx, but husband will also assist as needed.  Reviewed shower transfer but pt did not practice due to pain.  She verbalizes understanding of sequence.     Vision                     Perception     Praxis      Pertinent Vitals/Pain Pain Assessment: 0-10 Pain Score: 6  Pain Location: R knee Pain Descriptors / Indicators: Aching Pain Intervention(s): Limited activity within patient's tolerance     Hand Dominance     Extremity/Trunk Assessment Upper Extremity Assessment Upper  Extremity Assessment: Overall WFL for tasks assessed          Communication Communication Communication: No difficulties   Cognition Arousal/Alertness: Awake/alert Behavior During Therapy: WFL for tasks assessed/performed Overall Cognitive Status: Within Functional Limits for tasks assessed                     General Comments       Exercises      Shoulder Instructions      Home Living Family/patient expects to be discharged to:: Private residence Living Arrangements: Spouse/significant other Available Help at Discharge: Family Type of Home: House Home Access: Stairs to enter Secretary/administratorntrance Stairs-Number of Steps: 4 Entrance Stairs-Rails: Right Home Layout: One level               Home Equipment: Bedside commode;Shower seat          Prior Functioning/Environment Level of Independence: Independent             OT Diagnosis: Generalized weakness   OT Problem List:     OT Treatment/Interventions:  OT Goals(Current goals can be found in the care plan section) Acute Rehab OT Goals Patient Stated Goal: Resume previous lifestyle with decreased pain  OT Frequency:     Barriers to D/C:            Co-evaluation              End of Session    Activity Tolerance: Patient limited by pain Patient left: in bed;with call bell/phone within reach;with family/visitor present   Time: [id number] OT Time Calculation (min): 17 min Charges:  OT General Charges $OT Visit: 1 Procedure OT Evaluation $Initial OT Evaluation Tier I: 1 Procedure OT Treatments $Self Care/Home Management : 8-22 mins G-Codes:    Kaylub Detienne 01/14/2014, 12:54 PM Marica OtterMaryellen Blasa Raisch, OTR/L [phone] 01/14/2014

## 2014-01-14 NOTE — Progress Notes (Signed)
     Subjective: 1 Day Post-Op Procedure(s) (LRB): RIGHT TOTAL KNEE ARTHROPLASTY (Right)   Patient reports pain as moderate, controlled with medication mostly. No events throughout the night. Encouraged to work hard with PT and on her own.  Ready to be discharged home.  Objective:   VITALS:   Filed Vitals:   01/14/14 0546  BP: 99/86  Pulse: 87  Temp: 98.7 F (37.1 C)  Resp: 18    Dorsiflexion/Plantar flexion intact Incision: dressing C/D/I No cellulitis present Compartment soft  LABS  Recent Labs  01/14/14 0412  HGB 11.6*  HCT 37.6  WBC 10.0  PLT 248     Recent Labs  01/14/14 0412  NA 143  K 4.1  BUN 15  CREATININE 0.87  GLUCOSE 152*     Assessment/Plan: 1 Day Post-Op Procedure(s) (LRB): RIGHT TOTAL KNEE ARTHROPLASTY (Right) Foley cath d/c'ed Advance diet Up with therapy D/C IV fluids Discharge home with home health  [facility]. Follow up with OLIN,Binyomin Brann D in 2 weeks.  Contact information:  [facility] [address], [address] [place] [id number] [phone]    Morbid Obesity (BMI >40)  Estimated body mass index is 40.6 kg/(m^2) as calculated from the following:   Height as of this encounter: 5\' 5"  (1.651 m).   Weight as of this encounter: 110.678 kg (244 lb). Patient also counseled that weight may inhibit the healing process Patient counseled that losing weight will help with future health issues      Anastasio AuerbachMatthew S. Dominque Marlin   PAC  01/14/2014, 8:51 AM

## 2014-01-14 NOTE — Evaluation (Signed)
Physical Therapy Evaluation Patient Details Name: [name] MRN: [id number] DOB: 1966-01-26 Today's Date: 01/14/2014   History of Present Illness  R TKR  Clinical Impression  Pt s/p R TKR presents with decreased R LE strength/ROM and post op pain limiting functional mobility.  Pt should progress to d/c home with family assist and HHPT follow up.    Follow Up Recommendations Home health PT    Equipment Recommendations  Rolling walker with 5" wheels    Recommendations for Other Services OT consult     Precautions / Restrictions Precautions Precautions: Knee;Fall Restrictions Weight Bearing Restrictions: No Other Position/Activity Restrictions: WBAT      Mobility  Bed Mobility Overal bed mobility: Needs Assistance Bed Mobility: Supine to Sit     Supine to sit: Min assist     General bed mobility comments: cues for sequence and use of L LE to self assist  Transfers Overall transfer level: Needs assistance Equipment used: Rolling walker (2 wheeled) Transfers: Sit to/from Stand Sit to Stand: Min assist         General transfer comment: cues for LE management and use of UEs to self assist  Ambulation/Gait Ambulation/Gait assistance: Min assist Ambulation Distance (Feet): 58 Feet Assistive device: Rolling walker (2 wheeled) Gait Pattern/deviations: Step-to pattern;Decreased step length - right;Decreased step length - left;Shuffle Gait velocity: decr   General Gait Details: cues for sequence, posture and position from [employer]  Stairs            Wheelchair Mobility    Modified Rankin (Stroke Patients Only)       Balance                                             Pertinent Vitals/Pain Pain Assessment: 0-10 Pain Score: 6  Pain Location: R knee Pain Descriptors / Indicators: Aching Pain Intervention(s): Limited activity within patient's tolerance    Home Living Family/patient expects to be discharged to:: Private  residence Living Arrangements: Spouse/significant other Available Help at Discharge: Family Type of Home: House Home Access: Stairs to enter Entrance Stairs-Rails: Right Entrance Stairs-Number of Steps: 4 Home Layout: One level Home Equipment: Bedside commode;Shower seat      Prior Function Level of Independence: Independent               Hand Dominance        Extremity/Trunk Assessment   Upper Extremity Assessment: Overall WFL for tasks assessed           Lower Extremity Assessment: RLE deficits/detail RLE Deficits / Details: 3/5 quads with AAROM at knee -10 - 90       Communication   Communication: No difficulties  Cognition Arousal/Alertness: Awake/alert Behavior During Therapy: WFL for tasks assessed/performed Overall Cognitive Status: Within Functional Limits for tasks assessed                      General Comments      Exercises Total Joint Exercises Ankle Circles/Pumps: AROM;10 reps;Supine;Both Quad Sets: AROM;Both;10 reps;Supine Heel Slides: AAROM;Right;15 reps;Supine Straight Leg Raises: AAROM;AROM;Left;15 reps;Supine      Assessment/Plan    PT Assessment Patient needs continued PT services  PT Diagnosis Difficulty walking   PT Problem List Decreased strength;Decreased range of motion;Decreased activity tolerance;Decreased mobility;Decreased knowledge of use of DME;Pain;Obesity  PT Treatment Interventions DME instruction;Gait training;Stair training;Functional mobility training;Therapeutic activities;Therapeutic exercise;Patient/family education  PT Goals (Current goals can be found in the Care Plan section) Acute Rehab PT Goals Patient Stated Goal: Resume previous lifestyle with decreased pain PT Goal Formulation: With patient Time For Goal Achievement: 01/21/14 Potential to Achieve Goals: Good    Frequency 7X/week   Barriers to discharge        Co-evaluation               End of Session Equipment Utilized During  Treatment: Gait belt Activity Tolerance: Patient tolerated treatment well Patient left: in chair;with call bell/phone within reach;with family/visitor present Nurse Communication: Mobility status         Time: 1030-1100 PT Time Calculation (min): 30 min   Charges:   PT Evaluation $Initial PT Evaluation Tier I: 1 Procedure PT Treatments $Gait Training: 8-22 mins $Therapeutic Exercise: 8-22 mins   PT G Codes:          Shone Leventhal 01/14/2014, 12:42 PM

## 2014-01-14 NOTE — Progress Notes (Signed)
Physical Therapy Treatment Patient Details Name: Mary KillWendy W Santana MRN: [id number] DOB: 09/30/1965 Today's Date: 01/14/2014    History of Present Illness R TKR    PT Comments    Reviewed stairs with pt and spouse - crutch and handout supplied  Follow Up Recommendations  Home health PT     Equipment Recommendations  Rolling walker with 5" wheels    Recommendations for Other Services OT consult     Precautions / Restrictions Precautions Precautions: Knee;Fall Restrictions Weight Bearing Restrictions: No Other Position/Activity Restrictions: WBAT    Mobility  Bed Mobility Overal bed mobility: Needs Assistance Bed Mobility: Supine to Sit     Supine to sit: Supervision     General bed mobility comments: cues for sequence and use of L LE to self assist  Transfers Overall transfer level: Needs assistance Equipment used: Rolling walker (2 wheeled) Transfers: Sit to/from Stand Sit to Stand: Min guard         General transfer comment: cues for LE management and use of UEs to self assist  Ambulation/Gait Ambulation/Gait assistance: Min guard Ambulation Distance (Feet): 75 Feet Assistive device: Rolling walker (2 wheeled) Gait Pattern/deviations: Step-to pattern;Decreased step length - right;Decreased step length - left;Shuffle;Trunk flexed Gait velocity: decr   General Gait Details: cues for sequence, posture and position from RW   Stairs Stairs: Yes Stairs assistance: Min assist Stair Management: One rail Right;Step to pattern;Forwards;With crutches Number of Stairs: 8 General stair comments: cues for sequence and foot/crutch placement  Wheelchair Mobility    Modified Rankin (Stroke Patients Only)       Balance                                    Cognition Arousal/Alertness: Awake/alert Behavior During Therapy: WFL for tasks assessed/performed Overall Cognitive Status: Within Functional Limits for tasks assessed                      Exercises      General Comments        Pertinent Vitals/Pain Pain Assessment: 0-10 Pain Score: 6  Pain Location: R knee Pain Descriptors / Indicators: Aching Pain Intervention(s): Limited activity within patient's tolerance;Monitored during session;Premedicated before session;Ice applied    Home Living Family/patient expects to be discharged to:: Private residence Living Arrangements: Spouse/significant other Available Help at Discharge: Family Type of Home: House Home Access: Stairs to enter Entrance Stairs-Rails: Right Home Layout: One level Home Equipment: Bedside commode;Shower seat      Prior Function Level of Independence: Independent          PT Goals (current goals can now be found in the care plan section) Acute Rehab PT Goals Patient Stated Goal: Resume previous lifestyle with decreased pain PT Goal Formulation: With patient Time For Goal Achievement: 01/21/14 Potential to Achieve Goals: Good Progress towards PT goals: Progressing toward goals    Frequency  7X/week    PT Plan Current plan remains appropriate    Co-evaluation             End of Session Equipment Utilized During Treatment: Gait belt Activity Tolerance: Patient tolerated treatment well Patient left: in chair;with call bell/phone within reach;with family/visitor present     Time: [id number] PT Time Calculation (min): 30 min  Charges:  $Gait Training: 8-22 mins $Therapeutic Activity: 8-22 mins  G Codes:      [name] 01/14/2014, 3:22 PM

## 2014-01-14 NOTE — Progress Notes (Signed)
Utilization review completed.  

## 2014-01-14 NOTE — Care Management Note (Signed)
Page 1 of 2   01/14/2014     10:42:00 AM CARE MANAGEMENT NOTE 01/14/2014  Patient:  [name], [name]   Account Number:  [id number]  Date Initiated:  01/14/2014  Documentation initiated by:  [facility]  Subjective/Objective Assessment:   adm: RIGHT TOTAL KNEE ARTHROPLASTY (Right)     Action/Plan:   discharge planning   Anticipated DC Date:  01/14/2014   Anticipated DC Plan:  [place]  CM consult      [facility] Choice  HOME HEALTH   Choice offered to / List presented to:  C-1 Patient   DME arranged  NA      DME agency  NA     [place] arranged  HH-2 PT      [place]   Status of service:  Completed, signed off Medicare Important Message given?   (If response is "NO", the following Medicare IM given date fields will be blank) Date Medicare IM given:   Medicare IM given by:   Date Additional Medicare IM given:   Additional Medicare IM given by:    Discharge Disposition:  [place]  Per UR Regulation:    If discussed at Long Length of Stay Meetings, dates discussed:    Comments:  01/14/14 07:45 CM met with pt in room to offer choice of home health agency.  Pt chooses [facility] for HHPT.  Pt states she has a 3n1 and rolling wqalker at home.  CM called [facility] DME rep to notify pt does NOT need andy DME. Address and contact information verified with pt.  Referral called to [facility].  No other CM needs were communicated.  Mariane Masters, BSN, CM [phone].

## 2014-01-21 NOTE — Discharge Summary (Signed)
Physician Discharge Summary  Patient ID: [name] MRN: [id number] DOB/AGE: 06-03-1965 48 y.o.  Admit date: 01/13/2014 Discharge date: 01/14/2014   Procedures:  Procedure(s) (LRB): RIGHT TOTAL KNEE ARTHROPLASTY (Right)  Attending Physician:  Dr. Durene Romans   Admission Diagnoses:   Right knee primary OA / pain  Discharge Diagnoses:  Principal Problem:   S/P right TKA Active Problems:   S/P knee replacement   Morbid obesity  Past Medical History  Diagnosis Date  . Depression   . GERD (gastroesophageal reflux disease)   . Hypothyroidism   . Pancreatic pseudocyst   . History of kidney stones   . IBS (irritable bowel syndrome)   . PONV (postoperative nausea and vomiting)   . History of pancreatitis 01-04-2011  . DDD (degenerative disc disease), cervical   . Right ureteral stone   . Muscle spasms of neck   . Cervical pain (neck)   . Right flank pain   . Nausea DUE TO PAIN FROM KIDNEY STONE  . Right knee pain   . Swelling of right knee joint   . Pneumonia     hx of pneumonia   . Chronic pancreatitis     HPI: [name], 48 y.o. female, has a history of pain and functional disability in the right knee due to arthritis and has failed non-surgical conservative treatments for greater than 12 weeks to includeNSAID's and/or analgesics, corticosteriod injections, viscosupplementation injections and activity modification. Onset of symptoms was gradual, starting 3+ years ago with gradually worsening course since that time. The patient noted prior procedures on the knee to include arthroscopy and menisectomy on the right knee(s). Patient currently rates pain in the right knee(s) at 10 out of 10 with activity. Patient has night pain, worsening of pain with activity and weight bearing, pain that interferes with activities of daily living, pain with passive range of motion, crepitus and joint swelling. Patient has evidence of periarticular osteophytes and joint space  narrowing by imaging studies. There is no active infection. Risks, benefits and expectations were discussed with the patient. Risks including but not limited to the risk of anesthesia, blood clots, nerve damage, blood vessel damage, failure of the prosthesis, infection and up to and including death. Patient understand the risks, benefits and expectations and wishes to proceed with surgery.  PCP: Lupita Raider, MD   Discharged Condition: good  Hospital Course:  Patient underwent the above stated procedure on 01/13/2014. Patient tolerated the procedure well and brought to the recovery room in good condition and subsequently to the floor.  POD #1 BP: 99/86 ; Pulse: 87 ; Temp: 98.7 F (37.1 C) ; Resp: 18  Patient reports pain as moderate, controlled with medication mostly. No events throughout the night. Encouraged to work hard with PT and on her own. Ready to be discharged home. Dorsiflexion/plantar flexion intact, incision: dressing C/D/I, no cellulitis present and compartment soft.   LABS  Basename    HGB  11.6  HCT  37.6    Discharge Exam: General appearance: alert, cooperative and no distress Extremities: Homans sign is negative, no sign of DVT, no edema, redness or tenderness in the calves or thighs and no ulcers, gangrene or trophic changes  Disposition: Home with follow up in 2 weeks   Follow-up Information    Follow up with Shelda Pal, MD. Schedule an appointment as soon as possible for a visit in 2 weeks.   Specialty:  Orthopedic Surgery   Contact information:   [address]  [place] [address] [phone]       [facility]   Why:  home health physical therapy   Contact information:   [address] [place] [address] [phone]       Discharge Instructions    Call MD / Call 911    Complete by:  As directed   If you experience chest pain or shortness of breath, CALL 911 and be transported to the  hospital emergency room.  If you develope a fever above 101 F, pus (white drainage) or increased drainage or redness at the wound, or calf pain, call your surgeon's office.     Change dressing    Complete by:  As directed   Maintain surgical dressing for 10-14 days, or until follow up in the clinic.     Constipation Prevention    Complete by:  As directed   Drink plenty of fluids.  Prune juice may be helpful.  You may use a stool softener, such as Colace (over the counter) 100 mg twice a day.  Use MiraLax (over the counter) for constipation as needed.     Diet - low sodium heart healthy    Complete by:  As directed      Discharge instructions    Complete by:  As directed   Maintain surgical dressing for 10-14 days, or until follow up in the clinic. [facility]. Call with any questions or concerns.     Driving restrictions    Complete by:  As directed   No driving for 4 weeks     Increase activity slowly as tolerated    Complete by:  As directed      TED hose    Complete by:  As directed   Use stockings (TED hose) for 2 weeks on both leg(s).  You may remove them at night for sleeping.     Weight bearing as tolerated    Complete by:  As directed   Laterality:  right  Extremity:  Lower             Medication List    STOP taking these medications        oxyCODONE-acetaminophen 10-325 MG per tablet  Commonly known as:  PERCOCET      TAKE these medications        albuterol 108 (90 BASE) MCG/ACT inhaler  Commonly known as:  PROVENTIL HFA;VENTOLIN HFA  Inhale 2 puffs into the lungs every 6 (six) hours as needed for wheezing.     ALPRAZolam 1 MG tablet  Commonly known as:  XANAX  Take 1 mg by mouth 3 (three) times daily.     aspirin 325 MG EC tablet  Take 1 tablet (325 mg total) by mouth 2 (two) times daily.     beclomethasone 80 MCG/ACT inhaler  Commonly known as:  QVAR  Inhale 2 puffs into the lungs 2 (two) times daily as needed  (shortness of breath.).     buPROPion 300 MG 24 hr tablet  Commonly known as:  WELLBUTRIN XL  Take 300 mg by mouth every morning.     DSS 100 MG Caps  Take 100 mg by mouth 2 (two) times daily.     DULoxetine 30 MG capsule  Commonly known as:  CYMBALTA  Take 30 mg by mouth 3 (three) times daily.     ferrous sulfate 325 (65 FE) MG tablet  Take 1 tablet (325 mg  total) by mouth 3 (three) times daily after meals.     fluticasone 50 MCG/ACT nasal spray  Commonly known as:  FLONASE  Place 2 sprays into both nostrils daily as needed for allergies or rhinitis.     hydrochlorothiazide 25 MG tablet  Commonly known as:  HYDRODIURIL  Take 25 mg by mouth daily with breakfast.     hydrocortisone-pramoxine rectal foam  Commonly known as:  PROCTOFOAM-HC  Place 1 applicator rectally 2 (two) times daily as needed for hemorrhoids.     levothyroxine 125 MCG tablet  Commonly known as:  SYNTHROID, LEVOTHROID  Take 125 mcg by mouth daily before breakfast.     methocarbamol 500 MG tablet  Commonly known as:  ROBAXIN  Take 1 tablet (500 mg total) by mouth every 6 (six) hours as needed for muscle spasms.     multivitamin with minerals Tabs tablet  Take 1 tablet by mouth daily with lunch.     omeprazole 20 MG capsule  Commonly known as:  PRILOSEC  Take 20 mg by mouth at bedtime.     Oxycodone HCl 10 MG Tabs  Take 1-2 tablets (10-20 mg total) by mouth every 4 (four) hours as needed for severe pain.     polycarbophil 625 MG tablet  Commonly known as:  FIBERCON  Take 625 mg by mouth 3 (three) times daily.     polyethylene glycol packet  Commonly known as:  MIRALAX / GLYCOLAX  Take 17 g by mouth 2 (two) times daily.     pregabalin 300 MG capsule  Commonly known as:  LYRICA  Take 300 mg by mouth 2 (two) times daily.     promethazine 25 MG tablet  Commonly known as:  PHENERGAN  Take 25 mg by mouth every 6 (six) hours as needed for nausea.     sodium chloride 0.65 % Soln nasal spray    Commonly known as:  OCEAN  Place 1 spray into both nostrils daily. After flonase.     tamsulosin 0.4 MG Caps capsule  Commonly known as:  FLOMAX  Take 0.4 mg by mouth daily as needed (for kidney pain.).     tapentadol 50 MG Tabs tablet  Commonly known as:  NUCYNTA  Take 200 mg by mouth 2 (two) times daily.     URIBEL 118 MG Caps  Take 118 mg by mouth 4 (four) times daily as needed (UTI).     UROCIT-K 15 15 MEQ (1620 MG) Tbcr  Generic drug:  Potassium Citrate  Take 1 tablet by mouth at bedtime.         Signed: Anastasio AuerbachMatthew S. Rupal Childress   PA-C  01/21/2014, 1:10 PM

## 2014-07-24 ENCOUNTER — Other Ambulatory Visit (HOSPITAL_COMMUNITY): Payer: Self-pay | Admitting: Orthopedic Surgery

## 2014-07-24 DIAGNOSIS — Z96651 Presence of right artificial knee joint: Secondary | ICD-10-CM

## 2014-07-31 ENCOUNTER — Ambulatory Visit (HOSPITAL_COMMUNITY)
Admission: RE | Admit: 2014-07-31 | Discharge: 2014-07-31 | Disposition: A | Discharge status: Home / Self Care | Payer: BLUE CROSS/BLUE SHIELD | Source: Ambulatory Visit | Attending: Orthopedic Surgery | Admitting: Orthopedic Surgery

## 2014-07-31 ENCOUNTER — Encounter (HOSPITAL_COMMUNITY)
Admission: RE | Admit: 2014-07-31 | Discharge: 2014-07-31 | Disposition: A | Discharge status: Home / Self Care | Payer: BLUE CROSS/BLUE SHIELD | Source: Ambulatory Visit | Attending: Orthopedic Surgery | Admitting: Orthopedic Surgery

## 2014-07-31 DIAGNOSIS — Z96651 Presence of right artificial knee joint: Secondary | ICD-10-CM | POA: Insufficient documentation

## 2014-07-31 DIAGNOSIS — R936 Abnormal findings on diagnostic imaging of limbs: Secondary | ICD-10-CM | POA: Diagnosis not present

## 2014-07-31 DIAGNOSIS — M25561 Pain in right knee: Secondary | ICD-10-CM | POA: Diagnosis present

## 2014-07-31 DIAGNOSIS — M25562 Pain in left knee: Secondary | ICD-10-CM | POA: Diagnosis not present

## 2014-07-31 MED ORDER — TECHNETIUM TC 99M MEDRONATE IV KIT
25.0000 | PACK | Freq: Once | INTRAVENOUS | Status: AC | PRN
  Administered 2014-07-31: 25 via INTRAVENOUS

## 2014-08-20 NOTE — H&P (Signed)
[name] is an 49 y.o. female.    Chief Complaint: Failed right TKA vs infected TKA   Procedure:  Revision right TKA vs resection of TKA  HPI:  [name] is a 49 year old female who had a total knee arthroplasty performed in November 2015 related to advanced degenerative changes in the lateral and patellofemoral compartments associated with some subchondral collapse at that time. She had initially done well and we comment  that at the three-month followup she stated she done extremely well and was very pleased; however, recently she has noted increasing pain, dysfunction, and concern for her valgus angle of her knee increasing. She has failed to progress with physical therapy per our recommendations including use of brace and heel wedging.  She  Had a bone scan which revealed increased uptake around the knee. Dr. Charlann Boxer discussed the limitations in the study at six months out; however, correlating with her pain clinically, he was worried about something causing her problems. Previous attempted lab work indicated no signs of infection. She is here to discuss future intervention or needed intervention.  Risks, benefits and expectations were discussed with the patient.  Risks including but not limited to the risk of anesthesia, blood clots, nerve damage, blood vessel damage, failure of the prosthesis, infection and up to and including death.  Patient understand the risks, benefits and expectations and wishes to proceed with surgery.   PCP: Lupita Raider, MD  D/C Plans:      ome with HHPT  Post-op Meds:       No Rx given   Tranexamic Acid:      To be given - IV   Decadron:      Is not to be given - stated allergy  FYI:     ASA post-op  Morphine good (though she can't tolerate Dilaudid_  Oxycodone post-op    PMH: Past Medical History  Diagnosis Date  . Depression   . GERD (gastroesophageal reflux disease)   . Hypothyroidism   . Pancreatic pseudocyst   . History of kidney stones    . IBS (irritable bowel syndrome)   . PONV (postoperative nausea and vomiting)   . History of pancreatitis 01-04-2011  . DDD (degenerative disc disease), cervical   . Right ureteral stone   . Muscle spasms of neck   . Cervical pain (neck)   . Right flank pain   . Nausea DUE TO PAIN FROM KIDNEY STONE  . Right knee pain   . Swelling of right knee joint   . Pneumonia     hx of pneumonia   . Chronic pancreatitis     PSH: Past Surgical History  Procedure Laterality Date  . Eus  03/01/2011    Procedure: UPPER ENDOSCOPIC ULTRASOUND (EUS) LINEAR;  Surgeon: Freddy Jaksch, MD;  Location: [facility];  Service: Endoscopy;  Laterality: N/A;  . Fine needle aspiration  03/01/2011    Procedure: FINE NEEDLE ASPIRATION (FNA) LINEAR;  Surgeon: Freddy Jaksch, MD;  Location: [facility];  Service: Endoscopy;  Laterality: N/A;  . Laparoscopic cholecystectomy  01-09-2011  . Anterior lumbar fusion  11-09-2010     L5 - S1  . Robotic-assisted right salpingo-oophorectomy w/ lysis adhesions  12-21-2009    HX ENDOMETRIOSIS AND PELVIC PAIN  . Cysto/ right retrograde pyelogram/ right ureteroscopy and stent placement  09-24-2009    HX RIGHT URETERAL CALCULUS  . Diagnostic laparoscopy  1992    ENDOMETRIOSIS  . Laparoscopic assisted vaginal hysterectomy  1997  .  Ureteroscopic stone extractions      X3  . Cervical fusion  1999    C4 - 7  . Cervical spine surgery  APRIL 2011    REMOVAL SCAR TISSUE  . Tonsillectomy  AS CHILD  . Hernia repair  AS CHILD  . Knee arthroscopy  AUG 2013    RIGHT KNEE  . Cystoscopy with ureteroscopy  12/20/2011    Procedure: CYSTOSCOPY WITH URETEROSCOPY;  Surgeon: Milford Cage, MD;  Location: [facility];  Service: Urology;  Laterality: Right;  . Abdominal hysterectomy    . Joint replacement    . Back surgery  2012    lower back surgery   . Total knee arthroplasty Right 01/13/2014    Procedure: RIGHT TOTAL KNEE ARTHROPLASTY;  Surgeon: Shelda Pal, MD;  Location: [facility];  Service: Orthopedics;  Laterality: Right;    Social History:  reports that she has never smoked. She has never used smokeless tobacco. She reports that she does not drink alcohol or use illicit drugs.  Allergies:  Allergies  Allergen Reactions  . Deltasone [Prednisone] Other (See Comments)    Puts her into a coma. Was admitted to ICU for it.  . Solu-Medrol [Methylprednisolone] Shortness Of Breath and Other (See Comments)    Sob, Ams  . Dilaudid [Hydromorphone Hcl]     Hallucinations  . Zolpidem Tartrate Other (See Comments)    hallucinations    Medications: No current facility-administered medications for this encounter.   Current Outpatient Prescriptions  Medication Sig Dispense Refill  . albuterol (PROVENTIL HFA;VENTOLIN HFA) 108 (90 BASE) MCG/ACT inhaler Inhale 2 puffs into the lungs every 6 (six) hours as needed for wheezing.    Marland Kitchen ALPRAZolam (XANAX) 1 MG tablet Take 1 mg by mouth 3 (three) times daily.     . beclomethasone (QVAR) 80 MCG/ACT inhaler Inhale 2 puffs into the lungs 2 (two) times daily as needed (shortness of breath.).     Marland Kitchen buPROPion (WELLBUTRIN XL) 300 MG 24 hr tablet Take 300 mg by mouth every morning.     . docusate sodium 100 MG CAPS Take 100 mg by mouth 2 (two) times daily. 10 capsule 0  . DULoxetine (CYMBALTA) 30 MG capsule Take 30 mg by mouth 3 (three) times daily.     . ferrous sulfate 325 (65 FE) MG tablet Take 1 tablet (325 mg total) by mouth 3 (three) times daily after meals.  3  . fluticasone (FLONASE) 50 MCG/ACT nasal spray Place 2 sprays into both nostrils daily as needed for allergies or rhinitis.    . hydrochlorothiazide (HYDRODIURIL) 25 MG tablet Take 25 mg by mouth daily with breakfast.     . hydrocortisone-pramoxine (PROCTOFOAM-HC) rectal foam Place 1 applicator rectally 2 (two) times daily as needed for hemorrhoids.    Marland Kitchen levothyroxine (SYNTHROID, LEVOTHROID) 125 MCG tablet Take 125 mcg by mouth daily before breakfast.     . Meth-Hyo-M Bl-Na Phos-Ph Sal (URIBEL) 118 MG CAPS Take 118 mg by mouth 4 (four) times daily as needed (UTI).     . methocarbamol (ROBAXIN) 500 MG tablet Take 1 tablet (500 mg total) by mouth every 6 (six) hours as needed for muscle spasms. 50 tablet 0  . Multiple Vitamin (MULTIVITAMIN WITH MINERALS) TABS Take 1 tablet by mouth daily with lunch.     Marland Kitchen omeprazole (PRILOSEC) 20 MG capsule Take 20 mg by mouth at bedtime.    Marland Kitchen oxyCODONE 10 MG TABS Take 1-2 tablets (10-20 mg total) by mouth  every 4 (four) hours as needed for severe pain. 100 tablet 0  . polycarbophil (FIBERCON) 625 MG tablet Take 625 mg by mouth 3 (three) times daily.    . polyethylene glycol (MIRALAX / GLYCOLAX) packet Take 17 g by mouth 2 (two) times daily.    . Potassium Citrate (UROCIT-K 15) 15 MEQ (1620 MG) TBCR Take 1 tablet by mouth at bedtime.     . pregabalin (LYRICA) 300 MG capsule Take 300 mg by mouth 2 (two) times daily.    . promethazine (PHENERGAN) 25 MG tablet Take 25 mg by mouth every 6 (six) hours as needed for nausea.    . sodium chloride (OCEAN) 0.65 % SOLN nasal spray Place 1 spray into both nostrils daily. After flonase.    . Tamsulosin HCl (FLOMAX) 0.4 MG CAPS Take 0.4 mg by mouth daily as needed (for kidney pain.).     Marland Kitchen tapentadol (NUCYNTA) 50 MG TABS tablet Take 200 mg by mouth 2 (two) times daily.       Review of Systems  Constitutional: Negative.   HENT: Negative.   Eyes: Negative.   Respiratory: Negative.   Cardiovascular: Negative.   Gastrointestinal: Positive for heartburn.  Genitourinary: Negative.   Musculoskeletal: Positive for joint pain.  Skin: Negative.   Neurological: Negative.   Endo/Heme/Allergies: Negative.   Psychiatric/Behavioral: Positive for depression.       Physical Exam  Constitutional: She is oriented to person, place, and time. She appears well-developed and well-nourished.  HENT:  Head: Normocephalic and atraumatic.  Eyes: Pupils are equal, round, and reactive to  light.  Neck: Neck supple. No JVD present. No tracheal deviation present. No thyromegaly present.  Cardiovascular: Normal rate, normal heart sounds and intact distal pulses.   Respiratory: Effort normal and breath sounds normal. No stridor. No respiratory distress. She has no wheezes.  GI: Soft. There is no tenderness. There is no guarding.  Musculoskeletal:       Right knee: She exhibits decreased range of motion, effusion, laceration (healed from previous incision) and bony tenderness. She exhibits no ecchymosis, no deformity and no erythema. Tenderness found.  Lymphadenopathy:    She has no cervical adenopathy.  Neurological: She is alert and oriented to person, place, and time.  Skin: Skin is warm and dry.  Psychiatric: She has a normal mood and affect.     Assessment/Plan Assessment: Failed right TKA vs infected TKA  Plan: Patient will undergo a revision right TKA vs resection of TKA on 08/31/2014 per Dr. Charlann Boxer at [facility]. Risks benefits and expectations were discussed with the patient. Patient understand risks, benefits and expectations and wishes to proceed.   Anastasio Auerbach Hang Ammon   PA-C  08/20/2014, 10:17 AM

## 2014-08-25 ENCOUNTER — Encounter (HOSPITAL_COMMUNITY): Payer: Self-pay

## 2014-08-25 ENCOUNTER — Encounter (HOSPITAL_COMMUNITY)
Admission: RE | Admit: 2014-08-25 | Discharge: 2014-08-25 | Disposition: A | Discharge status: Home / Self Care | Payer: BLUE CROSS/BLUE SHIELD | Source: Ambulatory Visit | Attending: Orthopedic Surgery | Admitting: Orthopedic Surgery

## 2014-08-25 DIAGNOSIS — Z01818 Encounter for other preprocedural examination: Secondary | ICD-10-CM | POA: Diagnosis not present

## 2014-08-25 DIAGNOSIS — Z96651 Presence of right artificial knee joint: Secondary | ICD-10-CM | POA: Insufficient documentation

## 2014-08-25 HISTORY — DX: Constipation, unspecified: K59.00

## 2014-08-25 HISTORY — DX: Anxiety disorder, unspecified: F41.9

## 2014-08-25 HISTORY — DX: Urinary tract infection, site not specified: N39.0

## 2014-08-25 HISTORY — DX: Bronchitis, not specified as acute or chronic: J40

## 2014-08-25 LAB — PROTIME-INR
INR: 1.06 (ref 0.00–1.49)
PROTHROMBIN TIME: 14 s (ref 11.6–15.2)

## 2014-08-25 LAB — CBC
HEMATOCRIT: 39.9 % (ref 36.0–46.0)
HEMOGLOBIN: 12.4 g/dL (ref 12.0–15.0)
MCH: 25.4 pg — AB (ref 26.0–34.0)
MCHC: 31.1 g/dL (ref 30.0–36.0)
MCV: 81.8 fL (ref 78.0–100.0)
Platelets: 246 10*3/uL (ref 150–400)
RBC: 4.88 MIL/uL (ref 3.87–5.11)
RDW: 15.2 % (ref 11.5–15.5)
WBC: 7.9 10*3/uL (ref 4.0–10.5)

## 2014-08-25 LAB — URINALYSIS, ROUTINE W REFLEX MICROSCOPIC
BILIRUBIN URINE: NEGATIVE
Glucose, UA: NEGATIVE mg/dL
Hgb urine dipstick: NEGATIVE
Ketones, ur: NEGATIVE mg/dL
Nitrite: POSITIVE — AB
Protein, ur: NEGATIVE mg/dL
Specific Gravity, Urine: 1.029 (ref 1.005–1.030)
Urobilinogen, UA: 0.2 mg/dL (ref 0.0–1.0)
pH: 6 (ref 5.0–8.0)

## 2014-08-25 LAB — URINE MICROSCOPIC-ADD ON

## 2014-08-25 LAB — BASIC METABOLIC PANEL
Anion gap: 11 (ref 5–15)
BUN: 18 mg/dL (ref 6–20)
CO2: 32 mmol/L (ref 22–32)
Calcium: 9.3 mg/dL (ref 8.9–10.3)
Chloride: 98 mmol/L — ABNORMAL LOW (ref 101–111)
Creatinine, Ser: 0.66 mg/dL (ref 0.44–1.00)
GFR calc non Af Amer: >60 mL/min (ref >60)
Glucose, Bld: 100 mg/dL — ABNORMAL HIGH (ref 65–99)
POTASSIUM: 3.3 mmol/L — AB (ref 3.5–5.1)
SODIUM: 141 mmol/L (ref 135–145)

## 2014-08-25 LAB — SURGICAL PCR SCREEN
MRSA, PCR: POSITIVE — AB
Staphylococcus aureus: POSITIVE — AB

## 2014-08-25 LAB — APTT: aPTT: 42 seconds — ABNORMAL HIGH (ref 24–37)

## 2014-08-25 NOTE — Patient Instructions (Addendum)
[name] [facility]  08/25/2014   Your procedure is scheduled on: Monday August 31, 2014  Report to [facility] Main  Entrance and follow signs to               Short Stay Center arrive at 12:50 PM.  Call this number if you have problems the morning of surgery [phone]   Remember: ONLY 1 PERSON MAY GO WITH YOU TO SHORT STAY TO GET  READY MORNING OF YOUR SURGERY.  Do not eat food After Midnight but may take clear liquids till 8:30 am day of surgery then nothing by mouth. .     Take these medicines the morning of surgery with A SIP OF WATER: Levothyroxine; ALprazolam (Xanax) if needed; Albuterol Inhaler if needed (bring day of surgery); Qvar Inhaler if needed (bring day of surgery); Bupropion (Wellbutrin); Duloxetine (Cymbalta); Flonase if needed (bring day of surgery); Oxycodone if needed; Lyrica; Tapentadol (Nucynta) if needed                               You may not have any metal on your body including hair pins and              piercings  Do not wear jewelry, make-up, lotions, powders or perfumes, deodorant             Do not wear nail polish.  Do not shave  48 hours prior to surgery.               Do not bring valuables to the hospital. [facility] IS NOT             RESPONSIBLE   FOR VALUABLES.  Contacts, dentures or bridgework may not be worn into surgery.  Leave suitcase in the car. After surgery it may be brought to your room.                  Please read over the following fact sheets you were given:[facility] FACT SHEET; INCENTIVE SPIROMETER; BLOOD TRANSFUSION FACT SHEET  _____________________________________________________________________             [facility] - Preparing for Surgery Before surgery, you can play an important role.  Because skin is not sterile, your skin needs to be as free of germs as possible.  You can reduce the number of germs on your skin by washing with CHG (chlorahexidine gluconate) soap before surgery.  CHG is an antiseptic cleaner  which kills germs and bonds with the skin to continue killing germs even after washing. Please DO NOT use if you have an allergy to CHG or antibacterial soaps.  If your skin becomes reddened/irritated stop using the CHG and inform your nurse when you arrive at Short Stay. Do not shave (including legs and underarms) for at least 48 hours prior to the first CHG shower.  You may shave your face/neck. Please follow these instructions carefully:  1.  Shower with CHG Soap the night before surgery and the  morning of Surgery.  2.  If you choose to wash your hair, wash your hair first as usual with your  normal  shampoo.  3.  After you shampoo, rinse your hair and body thoroughly to remove the  shampoo.  4.  Use CHG as you would any other liquid soap.  You can apply chg directly  to the skin and wash                       Gently with a scrungie or clean washcloth.  5.  Apply the CHG Soap to your body ONLY FROM THE NECK DOWN.   Do not use on face/ open                           Wound or open sores. Avoid contact with eyes, ears mouth and genitals (private parts).                       Wash face,  Genitals (private parts) with your normal soap.             6.  Wash thoroughly, paying special attention to the area where your surgery  will be performed.  7.  Thoroughly rinse your body with warm water from the neck down.  8.  DO NOT shower/wash with your normal soap after using and rinsing off  the CHG Soap.                9.  Pat yourself dry with a clean towel.            10.  Wear clean pajamas.            11.  Place clean sheets on your bed the night of your first shower and do not  sleep with pets. Day of Surgery : Do not apply any lotions/deodorants the morning of surgery.  Please wear clean clothes to the hospital/surgery center.  FAILURE TO FOLLOW THESE INSTRUCTIONS MAY RESULT IN THE CANCELLATION OF YOUR SURGERY PATIENT SIGNATURE_________________________________  NURSE  SIGNATURE__________________________________  ________________________________________________________________________    CLEAR LIQUID DIET   Foods Allowed                                                                     Foods Excluded  Coffee and tea, regular and decaf                             liquids that you cannot  Plain Jell-O in any flavor                                             see through such as: Fruit ices (not with fruit pulp)                                     milk, soups, orange juice  Iced Popsicles                                    All solid food Carbonated beverages, regular and diet  Cranberry, grape and apple juices Sports drinks like Gatorade Lightly seasoned clear broth or consume(fat free) Sugar, honey syrup  Sample Menu Breakfast                                Lunch                                     Supper Cranberry juice                    Beef broth                            Chicken broth Jell-O                                     Grape juice                           Apple juice Coffee or tea                        Jell-O                                      Popsicle                                                Coffee or tea                        Coffee or tea  _____________________________________________________________________    Incentive Spirometer  An incentive spirometer is a tool that can help keep your lungs clear and active. This tool measures how well you are filling your lungs with each breath. Taking long deep breaths may help reverse or decrease the chance of developing breathing (pulmonary) problems (especially infection) following:  A long period of time when you are unable to move or be active. BEFORE THE PROCEDURE   If the spirometer includes an indicator to show your best effort, your nurse or respiratory therapist will set it to a desired goal.  If possible, sit up straight or lean  slightly forward. Try not to slouch.  Hold the incentive spirometer in an upright position. INSTRUCTIONS FOR USE   Sit on the edge of your bed if possible, or sit up as far as you can in bed or on a chair.  Hold the incentive spirometer in an upright position.  Breathe out normally.  Place the mouthpiece in your mouth and seal your lips tightly around it.  Breathe in slowly and as deeply as possible, raising the piston or the ball toward the top of the column.  Hold your breath for 3-5 seconds or for as long as possible. Allow the piston or ball to fall to the bottom of the column.  Remove the mouthpiece from your mouth and breathe out normally.  Rest for a few seconds and repeat Steps 1 through 7 at  least 10 times every 1-2 hours when you are awake. Take your time and take a few normal breaths between deep breaths.  The spirometer may include an indicator to show your best effort. Use the indicator as a goal to work toward during each repetition.  After each set of 10 deep breaths, practice coughing to be sure your lungs are clear. If you have an incision (the cut made at the time of surgery), support your incision when coughing by placing a pillow or rolled up towels firmly against it. Once you are able to get out of bed, walk around indoors and cough well. You may stop using the incentive spirometer when instructed by your caregiver.  RISKS AND COMPLICATIONS  Take your time so you do not get dizzy or light-headed.  If you are in pain, you may need to take or ask for pain medication before doing incentive spirometry. It is harder to take a deep breath if you are having pain. AFTER USE  Rest and breathe slowly and easily.  It can be helpful to keep track of a log of your progress. Your caregiver can provide you with a simple table to help with this. If you are using the spirometer at home, follow these instructions: [place] IF:   You are having difficultly using the  spirometer.  You have trouble using the spirometer as often as instructed.  Your pain medication is not giving enough relief while using the spirometer.  You develop fever of 100.5 F (38.1 C) or higher. SEEK IMMEDIATE MEDICAL CARE IF:   You cough up bloody sputum that had not been present before.  You develop fever of 102 F (38.9 C) or greater.  You develop worsening pain at or near the incision site. MAKE SURE YOU:   Understand these instructions.  Will watch your condition.  Will get help right away if you are not doing well or get worse. Document Released: 07/10/2006 Document Revised: 05/22/2011 Document Reviewed: 09/10/2006 Exit[employer] Patient Information 2014 Exit[employer], [place].   ________________________________________________________________________  WHAT IS A BLOOD TRANSFUSION? Blood Transfusion Information  A transfusion is the replacement of blood or some of its parts. Blood is made up of multiple cells which provide different functions.  Red blood cells carry oxygen and are used for blood loss replacement.  White blood cells fight against infection.  Platelets control bleeding.  Plasma helps clot blood.  Other blood products are available for specialized needs, such as hemophilia or other clotting disorders. BEFORE THE TRANSFUSION  Who gives blood for transfusions?   Healthy volunteers who are fully evaluated to make sure their blood is safe. This is blood bank blood. Transfusion therapy is the safest it has ever been in the practice of medicine. Before blood is taken from a donor, a complete history is taken to make sure that person has no history of diseases nor engages in risky social behavior (examples are intravenous drug use or sexual activity with multiple partners). The donor's travel history is screened to minimize risk of transmitting infections, such as malaria. The donated blood is tested for signs of infectious diseases, such as HIV and hepatitis.  The blood is then tested to be sure it is compatible with you in order to minimize the chance of a transfusion reaction. If you or a relative donates blood, this is often done in anticipation of surgery and is not appropriate for emergency situations. It takes many days to process the donated blood. RISKS AND COMPLICATIONS Although transfusion  therapy is very safe and saves many lives, the main dangers of transfusion include:   Getting an infectious disease.  Developing a transfusion reaction. This is an allergic reaction to something in the blood you were given. Every precaution is taken to prevent this. The decision to have a blood transfusion has been considered carefully by your caregiver before blood is given. Blood is not given unless the benefits outweigh the risks. AFTER THE TRANSFUSION  Right after receiving a blood transfusion, you will usually feel much better and more energetic. This is especially true if your red blood cells have gotten low (anemic). The transfusion raises the level of the red blood cells which carry oxygen, and this usually causes an energy increase.  The nurse administering the transfusion will monitor you carefully for complications. HOME CARE INSTRUCTIONS  No special instructions are needed after a transfusion. You may find your energy is better. Speak with your caregiver about any limitations on activity for underlying diseases you may have. SEEK MEDICAL CARE IF:   Your condition is not improving after your transfusion.  You develop redness or irritation at the intravenous (IV) site. SEEK IMMEDIATE MEDICAL CARE IF:  Any of the following symptoms occur over the next 12 hours:  Shaking chills.  You have a temperature by mouth above 102 F (38.9 C), not controlled by medicine.  Chest, back, or muscle pain.  People around you feel you are not acting correctly or are confused.  Shortness of breath or difficulty breathing.  Dizziness and fainting.  You  get a rash or develop hives.  You have a decrease in urine output.  Your urine turns a dark color or changes to pink, red, or brown. Any of the following symptoms occur over the next 10 days:  You have a temperature by mouth above 102 F (38.9 C), not controlled by medicine.  Shortness of breath.  Weakness after normal activity.  The white part of the eye turns yellow (jaundice).  You have a decrease in the amount of urine or are urinating less often.  Your urine turns a dark color or changes to pink, red, or brown. Document Released: 02/25/2000 Document Revised: 05/22/2011 Document Reviewed: 10/14/2007 [facility] Patient Information 2014 [place], [place].  _______________________________________________________________________

## 2014-08-25 NOTE — Progress Notes (Signed)
Spoke with Dr Acey Lav / Anesthesia in regards to Tapentadol (Nucynta). Dr Acey Lav stated okay for pt to take morning of surgery.

## 2014-08-25 NOTE — Progress Notes (Addendum)
BMP, urinalysis,micro and PTT results per epic per PAT visit 08/25/2014 sent to Dr Charlann Boxer

## 2014-08-26 NOTE — Progress Notes (Signed)
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 Pt did not feel she needed new prescription called in; results sent to Dr Charlann Boxer.

## 2014-08-31 ENCOUNTER — Inpatient Hospital Stay (HOSPITAL_COMMUNITY): Payer: BLUE CROSS/BLUE SHIELD | Admitting: Anesthesiology

## 2014-08-31 ENCOUNTER — Encounter (HOSPITAL_COMMUNITY)
Admission: RE | Disposition: A | Discharge status: Home Health | Payer: Self-pay | Source: Ambulatory Visit | Attending: Orthopedic Surgery

## 2014-08-31 ENCOUNTER — Encounter (HOSPITAL_COMMUNITY): Payer: Self-pay

## 2014-08-31 ENCOUNTER — Inpatient Hospital Stay (HOSPITAL_COMMUNITY)
Admission: RE | Admit: 2014-08-31 | Discharge: 2014-09-01 | DRG: 489 | Disposition: A | Discharge status: Home Health | Payer: BLUE CROSS/BLUE SHIELD | Source: Ambulatory Visit | Attending: Orthopedic Surgery | Admitting: Orthopedic Surgery

## 2014-08-31 DIAGNOSIS — T84092A Other mechanical complication of internal right knee prosthesis, initial encounter: Secondary | ICD-10-CM | POA: Diagnosis present

## 2014-08-31 DIAGNOSIS — E669 Obesity, unspecified: Secondary | ICD-10-CM | POA: Diagnosis present

## 2014-08-31 DIAGNOSIS — Z8701 Personal history of pneumonia (recurrent): Secondary | ICD-10-CM

## 2014-08-31 DIAGNOSIS — K219 Gastro-esophageal reflux disease without esophagitis: Secondary | ICD-10-CM | POA: Diagnosis present

## 2014-08-31 DIAGNOSIS — Y792 Prosthetic and other implants, materials and accessory orthopedic devices associated with adverse incidents: Secondary | ICD-10-CM | POA: Diagnosis present

## 2014-08-31 DIAGNOSIS — Z22322 Carrier or suspected carrier of Methicillin resistant Staphylococcus aureus: Secondary | ICD-10-CM

## 2014-08-31 DIAGNOSIS — Z6839 Body mass index (BMI) 39.0-39.9, adult: Secondary | ICD-10-CM | POA: Diagnosis not present

## 2014-08-31 DIAGNOSIS — Z96659 Presence of unspecified artificial knee joint: Secondary | ICD-10-CM

## 2014-08-31 DIAGNOSIS — Z01812 Encounter for preprocedural laboratory examination: Secondary | ICD-10-CM

## 2014-08-31 DIAGNOSIS — E039 Hypothyroidism, unspecified: Secondary | ICD-10-CM | POA: Diagnosis present

## 2014-08-31 DIAGNOSIS — Z87442 Personal history of urinary calculi: Secondary | ICD-10-CM

## 2014-08-31 DIAGNOSIS — Z96651 Presence of right artificial knee joint: Secondary | ICD-10-CM

## 2014-08-31 HISTORY — PX: TOTAL KNEE REVISION: SHX996

## 2014-08-31 LAB — TYPE AND SCREEN
ABO/RH(D): B POS
Antibody Screen: NEGATIVE

## 2014-08-31 SURGERY — TOTAL KNEE REVISION
Anesthesia: Spinal | Site: Knee | Laterality: Right

## 2014-08-31 MED ORDER — CELECOXIB 200 MG PO CAPS
200.0000 mg | ORAL_CAPSULE | Freq: Two times a day (BID) | ORAL | Status: DC
  Administered 2014-08-31 – 2014-09-01 (×2): 200 mg via ORAL
  Filled 2014-08-31 (×3): qty 1

## 2014-08-31 MED ORDER — METOCLOPRAMIDE HCL 10 MG PO TABS: 5.0000 mg | ORAL_TABLET | Freq: Three times a day (TID) | ORAL | Status: DC | PRN

## 2014-08-31 MED ORDER — LEVOTHYROXINE SODIUM 125 MCG PO TABS
125.0000 ug | ORAL_TABLET | Freq: Every day | ORAL | Status: DC
  Administered 2014-09-01: 125 ug via ORAL
  Filled 2014-08-31 (×2): qty 1

## 2014-08-31 MED ORDER — BUDESONIDE 0.25 MG/2ML IN SUSP
0.2500 mg | Freq: Two times a day (BID) | RESPIRATORY_TRACT | Status: DC
  Administered 2014-08-31: 0.25 mg via RESPIRATORY_TRACT
  Filled 2014-08-31: qty 2

## 2014-08-31 MED ORDER — ALPRAZOLAM 1 MG PO TABS
1.0000 mg | ORAL_TABLET | Freq: Three times a day (TID) | ORAL | Status: DC
  Administered 2014-08-31 – 2014-09-01 (×2): 1 mg via ORAL
  Filled 2014-08-31 (×2): qty 1

## 2014-08-31 MED ORDER — PROPOFOL INFUSION 10 MG/ML OPTIME
INTRAVENOUS | Status: DC | PRN
  Administered 2014-08-31: 75 ug/kg/min via INTRAVENOUS

## 2014-08-31 MED ORDER — TAPENTADOL HCL 50 MG PO TABS
150.0000 mg | ORAL_TABLET | Freq: Two times a day (BID) | ORAL | Status: DC
  Administered 2014-08-31 – 2014-09-01 (×2): 150 mg via ORAL
  Filled 2014-08-31 (×2): qty 3

## 2014-08-31 MED ORDER — MENTHOL 3 MG MT LOZG: 1.0000 | LOZENGE | OROMUCOSAL | Status: DC | PRN

## 2014-08-31 MED ORDER — POLYETHYLENE GLYCOL 3350 17 G PO PACK: 17.0000 g | PACK | Freq: Two times a day (BID) | ORAL | Status: DC

## 2014-08-31 MED ORDER — ONDANSETRON HCL 4 MG/2ML IJ SOLN
INTRAMUSCULAR | Status: AC
  Filled 2014-08-31: qty 2

## 2014-08-31 MED ORDER — BUPIVACAINE IN DEXTROSE 0.75-8.25 % IT SOLN
INTRATHECAL | Status: DC | PRN
  Administered 2014-08-31: 2 mL via INTRATHECAL

## 2014-08-31 MED ORDER — MORPHINE SULFATE 10 MG/ML IJ SOLN: 2.0000 mg | INTRAMUSCULAR | Status: DC | PRN

## 2014-08-31 MED ORDER — DOCUSATE SODIUM 100 MG PO CAPS
100.0000 mg | ORAL_CAPSULE | Freq: Two times a day (BID) | ORAL | Status: DC
  Administered 2014-08-31 – 2014-09-01 (×2): 100 mg via ORAL

## 2014-08-31 MED ORDER — HYDROCORTISONE ACE-PRAMOXINE 1-1 % RE FOAM: 1.0000 | Freq: Two times a day (BID) | RECTAL | Status: DC | PRN

## 2014-08-31 MED ORDER — METHOCARBAMOL 500 MG PO TABS
500.0000 mg | ORAL_TABLET | Freq: Four times a day (QID) | ORAL | Status: DC | PRN
  Administered 2014-08-31 – 2014-09-01 (×3): 500 mg via ORAL
  Filled 2014-08-31 (×3): qty 1

## 2014-08-31 MED ORDER — CHLORHEXIDINE GLUCONATE 4 % EX LIQD: 60.0000 mL | Freq: Once | CUTANEOUS | Status: DC

## 2014-08-31 MED ORDER — MIDAZOLAM HCL 5 MG/5ML IJ SOLN
INTRAMUSCULAR | Status: DC | PRN
  Administered 2014-08-31: 2 mg via INTRAVENOUS

## 2014-08-31 MED ORDER — CEFAZOLIN SODIUM-DEXTROSE 2-3 GM-% IV SOLR
2.0000 g | Freq: Four times a day (QID) | INTRAVENOUS | Status: AC
  Administered 2014-08-31 – 2014-09-01 (×2): 2 g via INTRAVENOUS
  Filled 2014-08-31 (×2): qty 50

## 2014-08-31 MED ORDER — PROPOFOL 10 MG/ML IV BOLUS
INTRAVENOUS | Status: DC | PRN
  Administered 2014-08-31: 30 mg via INTRAVENOUS
  Administered 2014-08-31 (×2): 10 mg via INTRAVENOUS

## 2014-08-31 MED ORDER — PROPOFOL 10 MG/ML IV BOLUS
INTRAVENOUS | Status: AC
  Filled 2014-08-31: qty 20

## 2014-08-31 MED ORDER — PROMETHAZINE HCL 25 MG/ML IJ SOLN
6.2500 mg | INTRAMUSCULAR | Status: DC | PRN
  Administered 2014-08-31: 6.25 mg via INTRAVENOUS

## 2014-08-31 MED ORDER — ASPIRIN EC 325 MG PO TBEC
325.0000 mg | DELAYED_RELEASE_TABLET | Freq: Two times a day (BID) | ORAL | Status: DC
  Administered 2014-09-01: 325 mg via ORAL
  Filled 2014-08-31 (×3): qty 1

## 2014-08-31 MED ORDER — STERILE WATER FOR IRRIGATION IR SOLN
Status: DC | PRN
  Administered 2014-08-31: 1500 mL

## 2014-08-31 MED ORDER — LACTATED RINGERS IV SOLN
INTRAVENOUS | Status: DC
  Administered 2014-08-31: 14:00:00 via INTRAVENOUS

## 2014-08-31 MED ORDER — SODIUM CHLORIDE 0.9 % IR SOLN
Status: DC | PRN
  Administered 2014-08-31: 3000 mL

## 2014-08-31 MED ORDER — FENTANYL CITRATE (PF) 100 MCG/2ML IJ SOLN: 25.0000 ug | INTRAMUSCULAR | Status: DC | PRN

## 2014-08-31 MED ORDER — KETOROLAC TROMETHAMINE 30 MG/ML IJ SOLN
INTRAMUSCULAR | Status: AC
Start: 2014-08-31 — End: 2014-08-31
  Filled 2014-08-31: qty 1

## 2014-08-31 MED ORDER — METHOCARBAMOL 1000 MG/10ML IJ SOLN
500.0000 mg | Freq: Four times a day (QID) | INTRAVENOUS | Status: DC | PRN
  Filled 2014-08-31: qty 5

## 2014-08-31 MED ORDER — HYDROCHLOROTHIAZIDE 25 MG PO TABS
25.0000 mg | ORAL_TABLET | Freq: Every day | ORAL | Status: DC
  Administered 2014-09-01: 25 mg via ORAL
  Filled 2014-08-31: qty 1

## 2014-08-31 MED ORDER — PREGABALIN 100 MG PO CAPS
300.0000 mg | ORAL_CAPSULE | Freq: Two times a day (BID) | ORAL | Status: DC
  Administered 2014-08-31 – 2014-09-01 (×2): 300 mg via ORAL
  Filled 2014-08-31 (×2): qty 3

## 2014-08-31 MED ORDER — CEFAZOLIN SODIUM-DEXTROSE 2-3 GM-% IV SOLR
INTRAVENOUS | Status: AC
  Filled 2014-08-31: qty 50

## 2014-08-31 MED ORDER — KETOROLAC TROMETHAMINE 30 MG/ML IJ SOLN
INTRAMUSCULAR | Status: DC | PRN
Start: 2014-08-31 — End: 2014-08-31
  Administered 2014-08-31: 30 mg

## 2014-08-31 MED ORDER — BISACODYL 10 MG RE SUPP: 10.0000 mg | Freq: Every day | RECTAL | Status: DC | PRN

## 2014-08-31 MED ORDER — MIDAZOLAM HCL 2 MG/2ML IJ SOLN
INTRAMUSCULAR | Status: AC
  Filled 2014-08-31: qty 2

## 2014-08-31 MED ORDER — OXYCODONE HCL 5 MG PO TABS
15.0000 mg | ORAL_TABLET | ORAL | Status: DC
  Administered 2014-08-31: 15 mg via ORAL
  Administered 2014-08-31 – 2014-09-01 (×5): 30 mg via ORAL
  Filled 2014-08-31: qty 6
  Filled 2014-08-31: qty 3
  Filled 2014-08-31 (×4): qty 6

## 2014-08-31 MED ORDER — SODIUM CHLORIDE 0.9 % IV SOLN
INTRAVENOUS | Status: AC
  Administered 2014-08-31: 23:00:00 via INTRAVENOUS
  Filled 2014-08-31 (×4): qty 1000

## 2014-08-31 MED ORDER — ONDANSETRON HCL 4 MG PO TABS: 4.0000 mg | ORAL_TABLET | Freq: Four times a day (QID) | ORAL | Status: DC | PRN

## 2014-08-31 MED ORDER — 0.9 % SODIUM CHLORIDE (POUR BTL) OPTIME
TOPICAL | Status: DC | PRN
  Administered 2014-08-31: 1000 mL

## 2014-08-31 MED ORDER — SODIUM CHLORIDE 0.9 % IJ SOLN
INTRAMUSCULAR | Status: DC | PRN
  Administered 2014-08-31: 30 mL

## 2014-08-31 MED ORDER — MAGNESIUM CITRATE PO SOLN: 1.0000 | Freq: Once | ORAL | Status: AC | PRN

## 2014-08-31 MED ORDER — SODIUM CHLORIDE 0.9 % IJ SOLN
INTRAMUSCULAR | Status: AC
  Filled 2014-08-31: qty 50

## 2014-08-31 MED ORDER — ALBUTEROL SULFATE (2.5 MG/3ML) 0.083% IN NEBU: 2.5000 mg | INHALATION_SOLUTION | Freq: Four times a day (QID) | RESPIRATORY_TRACT | Status: DC | PRN

## 2014-08-31 MED ORDER — SODIUM CHLORIDE 0.9 % IV SOLN
1500.0000 mg | Freq: Once | INTRAVENOUS | Status: AC
  Administered 2014-08-31: 1500 mg via INTRAVENOUS
  Filled 2014-08-31: qty 1500

## 2014-08-31 MED ORDER — SODIUM CHLORIDE 0.9 % IV SOLN
1000.0000 mg | Freq: Once | INTRAVENOUS | Status: AC
  Administered 2014-08-31: 1000 mg via INTRAVENOUS
  Filled 2014-08-31: qty 10

## 2014-08-31 MED ORDER — ONDANSETRON HCL 4 MG/2ML IJ SOLN
4.0000 mg | Freq: Four times a day (QID) | INTRAMUSCULAR | Status: DC | PRN
  Administered 2014-09-01: 4 mg via INTRAVENOUS
  Filled 2014-08-31: qty 2

## 2014-08-31 MED ORDER — BUPROPION HCL ER (XL) 300 MG PO TB24
300.0000 mg | ORAL_TABLET | Freq: Every day | ORAL | Status: DC
  Administered 2014-09-01: 300 mg via ORAL
  Filled 2014-08-31: qty 1

## 2014-08-31 MED ORDER — FLUTICASONE PROPIONATE 50 MCG/ACT NA SUSP: 2.0000 | Freq: Every day | NASAL | Status: DC | PRN

## 2014-08-31 MED ORDER — BUPIVACAINE-EPINEPHRINE 0.25% -1:200000 IJ SOLN
INTRAMUSCULAR | Status: DC | PRN
Start: 2014-08-31 — End: 2014-08-31
  Administered 2014-08-31: 30 mL

## 2014-08-31 MED ORDER — TAMSULOSIN HCL 0.4 MG PO CAPS
0.4000 mg | ORAL_CAPSULE | Freq: Every day | ORAL | Status: DC | PRN
  Filled 2014-08-31: qty 1

## 2014-08-31 MED ORDER — POTASSIUM CITRATE ER 10 MEQ (1080 MG) PO TBCR
10.0000 meq | EXTENDED_RELEASE_TABLET | Freq: Every day | ORAL | Status: DC
  Administered 2014-08-31: 10 meq via ORAL
  Filled 2014-08-31 (×2): qty 1

## 2014-08-31 MED ORDER — MORPHINE SULFATE 2 MG/ML IJ SOLN
1.0000 mg | INTRAMUSCULAR | Status: DC | PRN
  Administered 2014-09-01 (×2): 2 mg via INTRAVENOUS
  Filled 2014-08-31 (×2): qty 1

## 2014-08-31 MED ORDER — FERROUS SULFATE 325 (65 FE) MG PO TABS
325.0000 mg | ORAL_TABLET | Freq: Three times a day (TID) | ORAL | Status: DC
  Filled 2014-08-31 (×4): qty 1

## 2014-08-31 MED ORDER — BUPIVACAINE-EPINEPHRINE (PF) 0.25% -1:200000 IJ SOLN
INTRAMUSCULAR | Status: AC
  Filled 2014-08-31: qty 30

## 2014-08-31 MED ORDER — CEFAZOLIN SODIUM-DEXTROSE 2-3 GM-% IV SOLR
2.0000 g | INTRAVENOUS | Status: AC
  Administered 2014-08-31: 2 g via INTRAVENOUS

## 2014-08-31 MED ORDER — METOCLOPRAMIDE HCL 5 MG/ML IJ SOLN: 5.0000 mg | Freq: Three times a day (TID) | INTRAMUSCULAR | Status: DC | PRN

## 2014-08-31 MED ORDER — PHENOL 1.4 % MT LIQD: 1.0000 | OROMUCOSAL | Status: DC | PRN

## 2014-08-31 MED ORDER — PROMETHAZINE HCL 25 MG/ML IJ SOLN
INTRAMUSCULAR | Status: AC
  Filled 2014-08-31: qty 1

## 2014-08-31 MED ORDER — LACTATED RINGERS IV SOLN
INTRAVENOUS | Status: DC
  Administered 2014-08-31 (×2): via INTRAVENOUS

## 2014-08-31 MED ORDER — FENTANYL CITRATE (PF) 100 MCG/2ML IJ SOLN
INTRAMUSCULAR | Status: DC | PRN
Start: 2014-08-31 — End: 2014-08-31
  Administered 2014-08-31: 50 ug via INTRAVENOUS

## 2014-08-31 MED ORDER — ALUM & MAG HYDROXIDE-SIMETH 200-200-20 MG/5ML PO SUSP: 30.0000 mL | ORAL | Status: DC | PRN

## 2014-08-31 MED ORDER — DEXAMETHASONE SODIUM PHOSPHATE 10 MG/ML IJ SOLN
INTRAMUSCULAR | Status: AC
  Filled 2014-08-31: qty 1

## 2014-08-31 MED ORDER — FENTANYL CITRATE (PF) 100 MCG/2ML IJ SOLN
INTRAMUSCULAR | Status: AC
  Filled 2014-08-31: qty 2

## 2014-08-31 MED ORDER — DULOXETINE HCL 60 MG PO CPEP
60.0000 mg | ORAL_CAPSULE | Freq: Two times a day (BID) | ORAL | Status: DC
  Administered 2014-08-31 – 2014-09-01 (×2): 60 mg via ORAL
  Filled 2014-08-31 (×3): qty 1

## 2014-08-31 SURGICAL SUPPLY — 69 items
ATTUNE PSRP INSR SZ4 16 KNEE (Insert) ×1 IMPLANT
ATTUNE PSRP INSR SZ4 16MM KNEE (Insert) ×1 IMPLANT
BAG DECANTER FOR FLEXI CONT (MISCELLANEOUS) IMPLANT
BAG SPEC THK2 15X12 ZIP CLS (MISCELLANEOUS)
BAG ZIPLOCK 12X15 (MISCELLANEOUS) IMPLANT
BANDAGE ELASTIC 6 VELCRO ST LF (GAUZE/BANDAGES/DRESSINGS) ×3 IMPLANT
BANDAGE ESMARK 6X9 LF (GAUZE/BANDAGES/DRESSINGS) ×1 IMPLANT
BLADE SAW SGTL 13.0X1.19X90.0M (BLADE) ×3 IMPLANT
BLADE SAW SGTL 81X20 HD (BLADE) ×3 IMPLANT
BNDG CMPR 9X6 STRL LF SNTH (GAUZE/BANDAGES/DRESSINGS) ×1
BNDG ESMARK 6X9 LF (GAUZE/BANDAGES/DRESSINGS) ×3
BRUSH FEMORAL CANAL (MISCELLANEOUS) IMPLANT
CUFF TOURN SGL QUICK 34 (TOURNIQUET CUFF) ×3
CUFF TRNQT CYL 34X4X40X1 (TOURNIQUET CUFF) ×1 IMPLANT
DRAPE EXTREMITY T 121X128X90 (DRAPE) ×3 IMPLANT
DRAPE POUCH INSTRU U-SHP 10X18 (DRAPES) ×3 IMPLANT
DRAPE U-SHAPE 47X51 STRL (DRAPES) ×3 IMPLANT
DRSG ADAPTIC 3X8 NADH LF (GAUZE/BANDAGES/DRESSINGS) IMPLANT
DRSG AQUACEL AG ADV 3.5X10 (GAUZE/BANDAGES/DRESSINGS) ×3 IMPLANT
DRSG PAD ABDOMINAL 8X10 ST (GAUZE/BANDAGES/DRESSINGS) IMPLANT
DRSG TEGADERM 4X4.75 (GAUZE/BANDAGES/DRESSINGS) IMPLANT
DURAPREP 26ML APPLICATOR (WOUND CARE) ×6 IMPLANT
ELECT REM PT RETURN 9FT ADLT (ELECTROSURGICAL) ×3
ELECTRODE REM PT RTRN 9FT ADLT (ELECTROSURGICAL) ×1 IMPLANT
FACESHIELD WRAPAROUND (MASK) ×15 IMPLANT
FACESHIELD WRAPAROUND OR TEAM (MASK) ×5 IMPLANT
GAUZE SPONGE 2X2 8PLY STRL LF (GAUZE/BANDAGES/DRESSINGS) IMPLANT
GAUZE SPONGE 4X4 12PLY STRL (GAUZE/BANDAGES/DRESSINGS) IMPLANT
GLOVE BIOGEL PI IND STRL 7.5 (GLOVE) ×1 IMPLANT
GLOVE BIOGEL PI IND STRL 8.5 (GLOVE) ×1 IMPLANT
GLOVE BIOGEL PI INDICATOR 7.5 (GLOVE) ×2
GLOVE BIOGEL PI INDICATOR 8.5 (GLOVE) ×2
GLOVE ECLIPSE 8.0 STRL XLNG CF (GLOVE) ×3 IMPLANT
GLOVE ORTHO TXT STRL SZ7.5 (GLOVE) ×6 IMPLANT
GOWN SPEC L3 XXLG W/TWL (GOWN DISPOSABLE) ×3 IMPLANT
GOWN STRL REUS W/TWL LRG LVL3 (GOWN DISPOSABLE) ×3 IMPLANT
HANDPIECE INTERPULSE COAX TIP (DISPOSABLE) ×3
IMMOBILIZER KNEE 20 (SOFTGOODS)
IMMOBILIZER KNEE 20 THIGH 36 (SOFTGOODS) IMPLANT
KIT BASIN OR (CUSTOM PROCEDURE TRAY) ×3 IMPLANT
LIQUID BAND (GAUZE/BANDAGES/DRESSINGS) ×3 IMPLANT
MANIFOLD NEPTUNE II (INSTRUMENTS) ×3 IMPLANT
NDL SAFETY ECLIPSE 18X1.5 (NEEDLE) ×1 IMPLANT
NEEDLE HYPO 18GX1.5 SHARP (NEEDLE) ×3
NS IRRIG 1000ML POUR BTL (IV SOLUTION) ×3 IMPLANT
PACK TOTAL JOINT (CUSTOM PROCEDURE TRAY) ×3 IMPLANT
PADDING CAST COTTON 6X4 STRL (CAST SUPPLIES) IMPLANT
PEN SKIN MARKING BROAD (MISCELLANEOUS) ×3 IMPLANT
POSITIONER SURGICAL ARM (MISCELLANEOUS) ×3 IMPLANT
SET HNDPC FAN SPRY TIP SCT (DISPOSABLE) ×1 IMPLANT
SET PAD KNEE POSITIONER (MISCELLANEOUS) ×3 IMPLANT
SPONGE GAUZE 2X2 STER 10/PKG (GAUZE/BANDAGES/DRESSINGS)
SPONGE LAP 18X18 X RAY DECT (DISPOSABLE) IMPLANT
STAPLER VISISTAT 35W (STAPLE) IMPLANT
SUCTION FRAZIER 12FR DISP (SUCTIONS) ×3 IMPLANT
SUT MNCRL AB 3-0 PS2 18 (SUTURE) ×3 IMPLANT
SUT VIC AB 1 CT1 36 (SUTURE) ×3 IMPLANT
SUT VIC AB 2-0 CT1 27 (SUTURE) ×9
SUT VIC AB 2-0 CT1 TAPERPNT 27 (SUTURE) ×3 IMPLANT
SUT VLOC 180 0 24IN GS25 (SUTURE) ×3 IMPLANT
SYR 50ML LL SCALE MARK (SYRINGE) ×3 IMPLANT
TOWEL OR 17X26 10 PK STRL BLUE (TOWEL DISPOSABLE) ×3 IMPLANT
TOWEL OR NON WOVEN STRL DISP B (DISPOSABLE) ×3 IMPLANT
TOWER CARTRIDGE SMART MIX (DISPOSABLE) ×3 IMPLANT
TRAY FOLEY W/METER SILVER 14FR (SET/KITS/TRAYS/PACK) ×3 IMPLANT
TUBE KAMVAC SUCTION (TUBING) IMPLANT
WATER STERILE IRR 1500ML POUR (IV SOLUTION) ×3 IMPLANT
WRAP KNEE MAXI GEL POST OP (GAUZE/BANDAGES/DRESSINGS) ×3 IMPLANT
YANKAUER SUCT BULB TIP 10FT TU (MISCELLANEOUS) ×3 IMPLANT

## 2014-08-31 NOTE — Plan of Care (Signed)
Problem: Phase I Progression Outcomes Goal: Voiding-avoid urinary catheter unless indicated Outcome: Not Met (add Reason) Foley placed.

## 2014-08-31 NOTE — Anesthesia Preprocedure Evaluation (Addendum)
Anesthesia Evaluation  Patient identified by MRN, date of birth, ID band Patient awake    Reviewed: Allergy & Precautions, NPO status , Patient's Chart, lab work & pertinent test results  History of Anesthesia Complications (+) PONV and history of anesthetic complications  Airway Mallampati: II  TM Distance: >3 FB Neck ROM: Full    Dental no notable dental hx.    Pulmonary pneumonia -, resolved,  breath sounds clear to auscultation  Pulmonary exam normal       Cardiovascular Exercise Tolerance: Good negative cardio ROS Normal cardiovascular examRhythm:Regular Rate:Normal     Neuro/Psych PSYCHIATRIC DISORDERS Anxiety Depression  Neuromuscular disease    GI/Hepatic Neg liver ROS, GERD-  Medicated,  Endo/Other  Hypothyroidism Morbid obesity  Renal/GU negative Renal ROS  negative genitourinary   Musculoskeletal  (+) Arthritis -,   Abdominal (+) + obese,   Peds negative pediatric ROS (+)  Hematology  (+) anemia ,   Anesthesia Other Findings   Reproductive/Obstetrics negative OB ROS                             Anesthesia Physical Anesthesia Plan  ASA: III  Anesthesia Plan: Spinal   Post-op Pain Management:    Induction: Intravenous  Airway Management Planned: Natural Airway  Additional Equipment:   Intra-op Plan:   Post-operative Plan:   Informed Consent: I have reviewed the patients History and Physical, chart, labs and discussed the procedure including the risks, benefits and alternatives for the proposed anesthesia with the patient or authorized representative who has indicated his/her understanding and acceptance.   Dental advisory given  Plan Discussed with: CRNA  Anesthesia Plan Comments: (Discussed risks and benefits of and differences between spinal and general. Discussed risks of spinal including headache, backache, failure, bleeding, infection, and nerve damage.  Patient consents to spinal. Questions answered. Coagulation studies and platelet count acceptable.  She had a spinal for her TKA done 01-13-14 with satisfactory result. Her PTT is slightly elevated but she is on no anticoagulants and has no h/o bleeding problems. I think spinal is the best choice for her today.)       Anesthesia Quick Evaluation

## 2014-08-31 NOTE — Brief Op Note (Signed)
08/31/2014  5:23 PM  PATIENT:  [name]  49 y.o. female  PRE-OPERATIVE DIAGNOSIS:  RIGHT FAILED TOTAL KNEE replacement  POST-OPERATIVE DIAGNOSIS:  RIGHT FAILED TOTAL KNEE replacement  PROCEDURE:  Procedure(s): RIGHT TOTAL KNEE ARTHROPLASTY Revision  SURGEON:  Surgeon(s) and Role:    * Durene Romans, MD - Primary  PHYSICIAN ASSISTANT: Lanney Gins, PA-C  ANESTHESIA:   spinal  EBL:  Total I/O In: 1000 [I.V.:1000] Out: 900 [Urine:900]  BLOOD ADMINISTERED:none  DRAINS: none   LOCAL MEDICATIONS USED:  MARCAINE     SPECIMEN:  No Specimen  DISPOSITION OF SPECIMEN:  N/A  COUNTS:  YES  TOURNIQUET:   Total Tourniquet Time Documented: Thigh (Right) - 35 minutes Total: Thigh (Right) - 35 minutes   DICTATION: .Other Dictation: Dictation Number [id number]  PLAN OF CARE: Admit to inpatient   PATIENT DISPOSITION:  PACU - hemodynamically stable.   Delay start of Pharmacological VTE agent (>24hrs) due to surgical blood loss or risk of bleeding: no

## 2014-08-31 NOTE — Transfer of Care (Signed)
Immediate Anesthesia Transfer of Care Note  Patient: [name] [facility]  Procedure(s) Performed: Procedure(s): RIGHT TOTAL KNEE ARTHROPLASTY RESECTION (Right)  Patient Location: PACU  Anesthesia Type:Spinal  Level of Consciousness:  sedated, patient cooperative and responds to stimulation  Airway & Oxygen Therapy:Patient Spontanous Breathing and Patient connected to face mask oxgen  Post-op Assessment:  Report given to PACU RN and Post -op Vital signs reviewed and stable  Post vital signs:  Reviewed and stable  Last Vitals:  Filed Vitals:   08/31/14 1255  BP: 105/50  Pulse: 100  Temp: 36.7 C  Resp: 16    Complications: No apparent anesthesia complications, T-12 level on exam denied pain on assessment.

## 2014-08-31 NOTE — Interval H&P Note (Signed)
History and Physical Interval Note:  08/31/2014 2:44 PM  [name]  has presented today for surgery, with the diagnosis of RIGHT FAILED TOTAL KNEE VS INFECTED TOTAL KNEE  The various methods of treatment have been discussed with the patient and family. After consideration of risks, benefits and other options for treatment, the patient has consented to  Procedure(s): RIGHT TOTAL KNEE ARTHROPLASTY REVISION VS RESECTION (Right) as a surgical intervention .  The patient's history has been reviewed, patient examined, no change in status, stable for surgery.  I have reviewed the patient's chart and labs.  Questions were answered to the patient's satisfaction.     Shelda Pal

## 2014-08-31 NOTE — Anesthesia Postprocedure Evaluation (Signed)
  Anesthesia Post-op Note  Patient: [name] [facility]  Procedure(s) Performed: Procedure(s) (LRB): RIGHT TOTAL KNEE ARTHROPLASTY RESECTION (Right)  Patient Location: PACU  Anesthesia Type: Spinal  Level of Consciousness: awake and alert   Airway and Oxygen Therapy: Patient Spontanous Breathing  Post-op Pain: mild  Post-op Assessment: Post-op Vital signs reviewed, Patient's Cardiovascular Status Stable, Respiratory Function Stable, Patent Airway and No signs of Nausea or vomiting  Last Vitals:  Filed Vitals:   08/31/14 1951  BP: 119/57  Pulse: 72  Temp: 36.8 C  Resp: 16    Post-op Vital Signs: stable   Complications: No apparent anesthesia complications. Regression of spinal level demonstrated.

## 2014-08-31 NOTE — Anesthesia Procedure Notes (Signed)
Spinal Patient location during procedure: OR Start time: 08/31/2014 3:57 PM End time: 08/31/2014 4:04 PM Reason for block: at surgeon's request Staffing Anesthesiologist: Franne Grip Resident/CRNA: Anne Fu Performed by: resident/CRNA and anesthesiologist  Preanesthetic Checklist Completed: patient identified, site marked, surgical consent, pre-op evaluation, timeout performed, IV checked, risks and benefits discussed, monitors and equipment checked and at surgeon's request Spinal Block Patient position: sitting Approach: midline Location: L2-3 Injection technique: single-shot Needle Needle type: Whitacre and Sprotte  Needle gauge: 24 G Needle length: 5 cm Assessment Sensory level: T6 Additional Notes Expiration date of kit checked and confirmed. Patient tolerated procedure well, without complications. X 2 attempt with noted clear CSF return. Loss of motor and sensory on exam post injection.  per CRNA Margorie John without success, MD [place] midline with noted CSF return. Pt tolerated procedure well.

## 2014-09-01 DIAGNOSIS — E669 Obesity, unspecified: Secondary | ICD-10-CM | POA: Diagnosis present

## 2014-09-01 LAB — BASIC METABOLIC PANEL
Anion gap: 8 (ref 5–15)
BUN: 17 mg/dL (ref 6–20)
CHLORIDE: 101 mmol/L (ref 101–111)
CO2: 29 mmol/L (ref 22–32)
CREATININE: 0.82 mg/dL (ref 0.44–1.00)
Calcium: 9 mg/dL (ref 8.9–10.3)
GFR calc Af Amer: >60 mL/min (ref >60)
GFR calc non Af Amer: >60 mL/min (ref >60)
Glucose, Bld: 151 mg/dL — ABNORMAL HIGH (ref 65–99)
Potassium: 5.1 mmol/L (ref 3.5–5.1)
Sodium: 138 mmol/L (ref 135–145)

## 2014-09-01 LAB — CBC
HCT: 38.8 % (ref 36.0–46.0)
Hemoglobin: 12.2 g/dL (ref 12.0–15.0)
MCH: 26 pg (ref 26.0–34.0)
MCHC: 31.4 g/dL (ref 30.0–36.0)
MCV: 82.7 fL (ref 78.0–100.0)
Platelets: 258 10*3/uL (ref 150–400)
RBC: 4.69 MIL/uL (ref 3.87–5.11)
RDW: 15.6 % — ABNORMAL HIGH (ref 11.5–15.5)
WBC: 7.5 10*3/uL (ref 4.0–10.5)

## 2014-09-01 MED ORDER — ASPIRIN 325 MG PO TBEC: 325.0000 mg | DELAYED_RELEASE_TABLET | Freq: Two times a day (BID) | ORAL | Status: AC

## 2014-09-01 MED ORDER — METHOCARBAMOL 500 MG PO TABS: 500.0000 mg | ORAL_TABLET | Freq: Four times a day (QID) | ORAL | Status: DC | PRN

## 2014-09-01 MED ORDER — FERROUS SULFATE 325 (65 FE) MG PO TABS: 325.0000 mg | ORAL_TABLET | Freq: Three times a day (TID) | ORAL | Status: DC

## 2014-09-01 MED ORDER — OXYCODONE HCL 10 MG PO TABS: 10.0000 mg | ORAL_TABLET | ORAL | Status: DC | PRN

## 2014-09-01 MED ORDER — PROMETHAZINE HCL 25 MG/ML IJ SOLN: 6.2500 mg | Freq: Four times a day (QID) | INTRAMUSCULAR | Status: DC | PRN

## 2014-09-01 MED ORDER — DOCUSATE SODIUM 100 MG PO CAPS: 100.0000 mg | ORAL_CAPSULE | Freq: Two times a day (BID) | ORAL | Status: DC

## 2014-09-01 MED ORDER — ASPIRIN 325 MG PO TBEC: 325.0000 mg | DELAYED_RELEASE_TABLET | Freq: Two times a day (BID) | ORAL | Status: DC

## 2014-09-01 MED ORDER — PROMETHAZINE HCL 25 MG PO TABS
12.5000 mg | ORAL_TABLET | Freq: Four times a day (QID) | ORAL | Status: DC | PRN
  Administered 2014-09-01: 12.5 mg via ORAL
  Filled 2014-09-01: qty 1

## 2014-09-01 MED ORDER — PROMETHAZINE HCL 25 MG PO TABS: 25.0000 mg | ORAL_TABLET | Freq: Three times a day (TID) | ORAL | Status: AC | PRN

## 2014-09-01 NOTE — Evaluation (Signed)
Physical Therapy Evaluation Patient Details Name: MY RINKE MRN: [id number] DOB: 08-19-1965 Today's Date: 09/01/2014   History of Present Illness  R TKA revisioin  Clinical Impression  Pt s/p R TKR revision presents with decreased R LE strength/ROM and post op pain limiting functional mobility.  Pt should progress to dc home with family assist and HHPT follow up.    Follow Up Recommendations Home health PT    Equipment Recommendations  None recommended by PT    Recommendations for Other Services OT consult     Precautions / Restrictions Precautions Precautions: Knee Required Braces or Orthoses: Knee Immobilizer - Right Knee Immobilizer - Right: On when out of bed or walking Restrictions Weight Bearing Restrictions: No Other Position/Activity Restrictions: WBAT      Mobility  Bed Mobility Overal bed mobility: Needs Assistance Bed Mobility: Sit to Supine       Sit to supine: Min guard   General bed mobility comments: min cues for sequence and management of R LE  Transfers Overall transfer level: Needs assistance Equipment used: Rolling walker (2 wheeled) Transfers: Sit to/from Stand Sit to Stand: Min guard         General transfer comment: Cues for LE management and use of UEs to self assist  Ambulation/Gait Ambulation/Gait assistance: Min assist Ambulation Distance (Feet): 88 Feet Assistive device: Rolling walker (2 wheeled) Gait Pattern/deviations: Step-to pattern;Decreased step length - right;Decreased step length - left;Shuffle;Trunk flexed Gait velocity: decreased with multiple short standing rests required to complete task   General Gait Details: cues for posture, position from RW and sequence  Stairs            Wheelchair Mobility    Modified Rankin (Stroke Patients Only)       Balance                                             Pertinent Vitals/Pain Pain Assessment: 0-10 Pain Score: 6  Pain Location: R  knee Pain Descriptors / Indicators: Aching;Sore Pain Intervention(s): Limited activity within patient's tolerance;Monitored during session;Premedicated before session;Ice applied;Patient requesting pain meds-RN notified    Home Living Family/patient expects to be discharged to:: Private residence Living Arrangements: Spouse/significant other Available Help at Discharge: Family Type of Home: House Home Access: Stairs to enter Entrance Stairs-Rails: Right Entrance Stairs-Number of Steps: 4 Home Layout: One level Home Equipment: Walker - 2 wheels;Bedside commode;Shower seat      Prior Function Level of Independence: Independent with assistive device(s)               Hand Dominance   Dominant Hand: Right    Extremity/Trunk Assessment   Upper Extremity Assessment: Overall WFL for tasks assessed           Lower Extremity Assessment: RLE deficits/detail RLE Deficits / Details: 3-/5 quads with AAROM at knee -8 - 80    Cervical / Trunk Assessment: Normal  Communication   Communication: No difficulties  Cognition Arousal/Alertness: Awake/alert Behavior During Therapy: WFL for tasks assessed/performed Overall Cognitive Status: Within Functional Limits for tasks assessed                      General Comments      Exercises Total Joint Exercises Ankle Circles/Pumps: AROM;Both;15 reps;Supine Quad Sets: AROM;Both;10 reps;Supine Heel Slides: AAROM;Right;15 reps;Supine Straight Leg Raises: AROM;Right;15 reps;Supine      Assessment/Plan  PT Assessment Patient needs continued PT services  PT Diagnosis Difficulty walking   PT Problem List Decreased strength;Decreased range of motion;Decreased activity tolerance;Decreased mobility;Decreased knowledge of use of DME;Obesity;Pain  PT Treatment Interventions DME instruction;Gait training;Stair training;Functional mobility training;Therapeutic activities;Therapeutic exercise;Patient/family education   PT Goals  (Current goals can be found in the Care Plan section) Acute Rehab PT Goals Patient Stated Goal: To have a straight knee and less pain when I walk PT Goal Formulation: With patient Time For Goal Achievement: 09/04/14 Potential to Achieve Goals: Good    Frequency 7X/week   Barriers to discharge        Co-evaluation               End of Session Equipment Utilized During Treatment: Gait belt;Right knee immobilizer Activity Tolerance: Patient tolerated treatment well Patient left: in bed;with call bell/phone within reach Nurse Communication: Mobility status;Patient requests pain meds         Time: 4580-9983 PT Time Calculation (min) (ACUTE ONLY): 38 min   Charges:     PT Treatments $Gait Training: 8-22 mins $Therapeutic Exercise: 8-22 mins   PT G Codes:        Gladys Gutman 09/23/2014, 1:03 PM

## 2014-09-01 NOTE — Care Management Note (Signed)
Case Management Note  Patient Details  Name: [name] MRN: [id number] Date of Birth: 02/06/1966  Subjective/Objective:                   RIGHT TOTAL KNEE ARTHROPLASTY RESECTION (Right) Action/Plan: Discharge planning  Expected Discharge Date:  09/01/14               Expected Discharge Plan:  [place]  In-House Referral:     Discharge planning Services  CM Consult  Post Acute Care Choice:  Home Health Choice offered to:  Patient  DME Arranged:    DME Agency:     HH Arranged:  PT HH Agency:  [place]  Status of Service:  Completed, signed off  Medicare Important Message Given:    Date Medicare IM Given:    Medicare IM give by:    Date Additional Medicare IM Given:    Additional Medicare Important Message give by:     If discussed at [place] of Stay Meetings, dates discussed:    Additional Comments: CM met with pt in room to offer choice of home health agency.  Pt chooses [facility] to render HHPT.  Pt has rolling walker and 3n1 at home.  Address and contact inforamtion verified by pt.  Referral given to [facility] rep, Tim (on unit).  No other CM needs were communicated. Dellie Catholic, RN 09/01/2014, 12:20 PM

## 2014-09-01 NOTE — Discharge Summary (Signed)
Physician Discharge Summary  Patient ID: [name] MRN: [id number] DOB/AGE: 49-Oct-1967 49 y.o.  Admit date: 08/31/2014 Discharge date:  09/01/2014  Procedures:  Procedure(s) (LRB): RIGHT TOTAL KNEE ARTHROPLASTY RESECTION (Right)  Attending Physician:  Dr. Durene Romans   Admission Diagnoses:   Failed right TKA   Discharge Diagnoses:  Principal Problem:   S/P revision right TKA Active Problems:   S/P revision of total knee   Obese  Past Medical History  Diagnosis Date  . Depression   . GERD (gastroesophageal reflux disease)   . Hypothyroidism   . Pancreatic pseudocyst   . History of kidney stones   . IBS (irritable bowel syndrome)   . PONV (postoperative nausea and vomiting)   . History of pancreatitis 01-04-2011  . DDD (degenerative disc disease), cervical   . Right ureteral stone   . Muscle spasms of neck   . Cervical pain (neck)   . Right flank pain   . Nausea DUE TO PAIN FROM KIDNEY STONE  . Right knee pain   . Swelling of right knee joint   . Pneumonia     hx of pneumonia   . Chronic pancreatitis   . Constipation   . Bronchitis     hx of   . Anxiety   . Urinary tract infection     hx of     HPI:    [name] is a 49 year old female who had a total knee arthroplasty performed in November 2015 related to advanced degenerative changes in the lateral and patellofemoral compartments associated with some subchondral collapse at that time. She had initially done well and we comment that at the three-month followup she stated she done extremely well and was very pleased; however, recently she has noted increasing pain, dysfunction, and concern for her valgus angle of her knee increasing. She has failed to progress with physical therapy per our recommendations including use of brace and heel wedging. She Had a bone scan which revealed increased uptake around the knee. Dr. Charlann Boxer discussed the limitations in the study at six months out; however, correlating with  her pain clinically, he was worried about something causing her problems. Previous attempted lab work indicated no signs of infection. She is here to discuss future intervention or needed intervention. Risks, benefits and expectations were discussed with the patient. Risks including but not limited to the risk of anesthesia, blood clots, nerve damage, blood vessel damage, failure of the prosthesis, infection and up to and including death. Patient understand the risks, benefits and expectations and wishes to proceed with surgery.   PCP: Gweneth Dimitri, MD   Discharged Condition: good  Hospital Course:  Patient underwent the above stated procedure on 08/31/2014. Patient tolerated the procedure well and brought to the recovery room in good condition and subsequently to the floor.  POD #1 BP: 131/73 ; Pulse: 82 ; Temp: 98.6 F (37 C) ; Resp: 16 Patient reports pain as mild, pain controlled. Dr. Charlann Boxer discussed the case and rationale. Also discussed the use of a KI with activity to help support the knee.  Ready to be discharged home. Dorsiflexion/plantar flexion intact, incision: dressing C/D/I, no cellulitis present and compartment soft.   LABS  Basename    HGB  12.2  HCT  38.8    Discharge Exam: General appearance: alert, cooperative and no distress Extremities: Homans sign is negative, no sign of DVT, no edema, redness or tenderness in the calves or thighs and no ulcers, gangrene or trophic changes  Disposition: Home with follow up in 2 weeks   Follow-up Information    Follow up with Shelda Pal, MD. Schedule an appointment as soon as possible for a visit in 2 weeks.   Specialty:  Orthopedic Surgery   Contact information:   [address] [place] [address] [phone]       Discharge Instructions    Call MD / Call 911    Complete by:  As directed   If you experience chest pain or shortness of breath, CALL 911 and be transported to the hospital emergency  room.  If you develope a fever above 101 F, pus (white drainage) or increased drainage or redness at the wound, or calf pain, call your surgeon's office.     Change dressing    Complete by:  As directed   Maintain surgical dressing until follow up in the clinic. If the edges start to pull up, may reinforce with tape. If the dressing is no longer working, may remove and cover with gauze and tape, but must keep the area dry and clean.  Call with any questions or concerns.     Constipation Prevention    Complete by:  As directed   Drink plenty of fluids.  Prune juice may be helpful.  You may use a stool softener, such as Colace (over the counter) 100 mg twice a day.  Use MiraLax (over the counter) for constipation as needed.     Diet - low sodium heart healthy    Complete by:  As directed      Discharge instructions    Complete by:  As directed   Maintain surgical dressing until follow up in the clinic. If the edges start to pull up, may reinforce with tape. If the dressing is no longer working, may remove and cover with gauze and tape, but must keep the area dry and clean.  Follow up in 2 [facility] at [facility]. Call with any questions or concerns.     Increase activity slowly as tolerated    Complete by:  As directed      TED hose    Complete by:  As directed   Use stockings (TED hose) for 2 weeks on both leg(s).  You may remove them at night for sleeping.     Weight bearing as tolerated    Complete by:  As directed   Use knee immobilizer with any activity, until instructed otherwise.  Laterality:  right  Extremity:  Lower             Medication List    TAKE these medications        albuterol 108 (90 BASE) MCG/ACT inhaler  Commonly known as:  PROVENTIL HFA;VENTOLIN HFA  Inhale 2 puffs into the lungs every 6 (six) hours as needed for wheezing.     ALPRAZolam 1 MG tablet  Commonly known as:  XANAX  Take 1 mg by mouth 3 (three) times daily.     aspirin 325 MG EC tablet    Take 1 tablet (325 mg total) by mouth 2 (two) times daily.     beclomethasone 80 MCG/ACT inhaler  Commonly known as:  QVAR  Inhale 2 puffs into the lungs 2 (two) times daily as needed (shortness of breath.).     buPROPion 300 MG 24 hr tablet  Commonly known as:  WELLBUTRIN XL  Take 300 mg by mouth every morning.     docusate sodium 100 MG capsule  Commonly known  as:  COLACE  Take 1 capsule (100 mg total) by mouth 2 (two) times daily.     DULoxetine 60 MG capsule  Commonly known as:  CYMBALTA  Take 60 mg by mouth 2 (two) times daily.     ferrous sulfate 325 (65 FE) MG tablet  Take 1 tablet (325 mg total) by mouth 3 (three) times daily after meals.     fluticasone 50 MCG/ACT nasal spray  Commonly known as:  FLONASE  Place 2 sprays into both nostrils daily as needed for allergies or rhinitis.     hydrochlorothiazide 25 MG tablet  Commonly known as:  HYDRODIURIL  Take 25 mg by mouth daily with breakfast.     hydrocortisone-pramoxine rectal foam  Commonly known as:  PROCTOFOAM-HC  Place 1 applicator rectally 2 (two) times daily as needed for hemorrhoids.     levothyroxine 125 MCG tablet  Commonly known as:  SYNTHROID, LEVOTHROID  Take 125 mcg by mouth daily before breakfast.     methocarbamol 500 MG tablet  Commonly known as:  ROBAXIN  Take 1 tablet (500 mg total) by mouth every 6 (six) hours as needed for muscle spasms.     NUCYNTA ER 150 MG Tb12  Generic drug:  Tapentadol HCl  Take 1 tablet by mouth 2 (two) times daily.     omeprazole 20 MG capsule  Commonly known as:  PRILOSEC  Take 20 mg by mouth at bedtime.     Oxycodone HCl 10 MG Tabs  Take 1-3 tablets (10-30 mg total) by mouth every 4 (four) hours as needed for severe pain.     polyethylene glycol packet  Commonly known as:  MIRALAX / GLYCOLAX  Take 17 g by mouth 2 (two) times daily.     potassium citrate 10 MEQ (1080 MG) SR tablet  Commonly known as:  UROCIT-K  Take 10 mEq by mouth at bedtime.      pregabalin 300 MG capsule  Commonly known as:  LYRICA  Take 300 mg by mouth 2 (two) times daily.     promethazine 25 MG tablet  Commonly known as:  PHENERGAN  Take 1 tablet (25 mg total) by mouth 3 (three) times daily as needed for nausea.     sodium chloride 0.65 % Soln nasal spray  Commonly known as:  OCEAN  Place 1 spray into both nostrils daily as needed for congestion. After flonase.     tamsulosin 0.4 MG Caps capsule  Commonly known as:  FLOMAX  Take 0.4 mg by mouth daily as needed (for kidney pain.).     URIBEL 118 MG Caps  Take 118 mg by mouth 4 (four) times daily as needed (UTI).         Signed: Anastasio Auerbach. Nisa Decaire   PA-C  09/01/2014, 10:32 AM

## 2014-09-01 NOTE — Progress Notes (Signed)
Occupational Therapy Evaluation Patient Details Name: [name] MRN: [id number] DOB: [birth year] Today's Date: 09/01/2014    History of Present Illness R TKA revisioin   Clinical Impression   Making excellent progress. Completing ADL and mobility @ RW level @ S level. All education completed. OT signing off.     Follow Up Recommendations  No OT follow up;Supervision - Intermittent    Equipment Recommendations  None recommended by OT    Recommendations for Other Services       Precautions / Restrictions Precautions Precautions: Knee Required Braces or Orthoses: Knee Immobilizer - Right - per Dr. Charlann Boxer - wear KI when walking Restrictions Weight Bearing Restrictions: No      Mobility Bed Mobility Overal bed mobility: Modified Independent                Transfers Overall transfer level: Needs assistance Equipment used: Rolling walker (2 wheeled) Transfers: Sit to/from [employer] Transfers Sit to Stand: Supervision Stand pivot transfers: Supervision            Balance    standing - fair                                        ADL Overall ADL's : Needs assistance/impaired                                     Functional mobility during ADLs: Supervision/safety;Rolling walker General ADL Comments: Completed education regarding functional mobility and comepnsatory techniques for ADL.Pt able to return demonstrate.     Vision     Perception     Praxis      Pertinent Vitals/Pain  5/10 R knee Limited activity Repositioned ice     Hand Dominance Right   Extremity/Trunk Assessment Upper Extremity Assessment Upper Extremity Assessment: Overall WFL for tasks assessed   Lower Extremity Assessment Lower Extremity Assessment: RLE deficits/detail RLE Deficits / Details: s/p R TKA revision   Cervical / Trunk Assessment Cervical / Trunk Assessment: Normal   Communication     Cognition Arousal/Alertness:  Awake/alert Behavior During Therapy: WFL for tasks assessed/performed Overall Cognitive Status: Within Functional Limits for tasks assessed                     General Comments       Exercises       Shoulder Instructions      Home Living Family/patient expects to be discharged to:: Private residence Living Arrangements: Spouse/significant other Available Help at Discharge: Family Type of Home: House Home Access: Stairs to enter Secretary/administrator of Steps: 4 Entrance Stairs-Rails: Right Home Layout: One level     Bathroom Shower/Tub: Walk-in shower;Door   [employer] Toilet: Standard Bathroom Accessibility: Yes How Accessible: Accessible via walker Home Equipment: Walker - 2 wheels;Bedside commode;Shower seat          Prior Functioning/Environment Level of Independence: Independent with assistive device(s) (RW for @ 1 week)             OT Diagnosis: Generalized weakness;Acute pain   OT Problem List: Decreased strength;Decreased range of motion;Decreased activity tolerance;Pain;Obesity   OT Treatment/Interventions:      OT Goals(Current goals can be found in the care plan section) Acute Rehab OT Goals Patient Stated Goal: to go home OT Goal Formulation: All assessment and education  complete, DC therapy  OT Frequency:     Barriers to D/C:            Co-evaluation              End of Session Equipment Utilized During Treatment: Rolling walker Nurse Communication: Mobility status  Activity Tolerance: Patient tolerated treatment well Patient left: in chair;with call bell/phone within reach   Time: 0935-1009 OT Time Calculation (min): 34 min Charges:  OT General Charges $OT Visit: 1 Procedure OT Evaluation $Initial OT Evaluation Tier I: 1 Procedure OT Treatments $Self Care/Home Management : 8-22 mins G-Codes:    Kynadi Dragos,HILLARY 2014-09-02, 10:14 AM   Luisa Dago, OTR/L  [phone] 09-02-2014

## 2014-09-01 NOTE — Progress Notes (Signed)
     Subjective: 1 Day Post-Op Procedure(s) (LRB): RIGHT TOTAL KNEE ARTHROPLASTY RESECTION (Right)   Seen by Dr. Charlann Boxer. Patient reports pain as mild, pain controlled.  Dr. Charlann Boxer discussed the case and rationale.  Also discussed the use of a KI with activity to help support the knee.  Objective:   VITALS:   Filed Vitals:   09/01/14  BP: 131/73  Pulse: 82  Temp: 98.6 F (37 C)   Resp: 16    Dorsiflexion/Plantar flexion intact Incision: dressing C/D/I No cellulitis present Compartment soft  LABS  Recent Labs  09/01/14 0510  HGB 12.2  HCT 38.8  WBC 7.5  PLT 258     Recent Labs  09/01/14 0510  NA 138  K 5.1  BUN 17  CREATININE 0.82  GLUCOSE 151*     Assessment/Plan: 1 Day Post-Op Procedure(s) (LRB): RIGHT TOTAL KNEE ARTHROPLASTY RESECTION (Right) Foley cath d/c'ed Advance diet Up with therapy D/C IV fluids Discharge home with home health  Follow up in 2 weeks at [facility]. Follow up with OLIN,Tarick Parenteau D in 2 weeks.  Contact information:  [facility] [address] [place] [address] [phone]    Obese (BMI 30-39.9) Estimated body mass index is 39.9 kg/(m^2) as calculated from the following:   Height as of this encounter: 5' 4.5" (1.638 m).   Weight as of this encounter: 107.049 kg (236 lb). Patient also counseled that weight may inhibit the healing process Patient counseled that losing weight will help with future health issues        Anastasio Auerbach. Trivia Heffelfinger   PAC  09/01/2014, 10:23 AM

## 2014-09-01 NOTE — Progress Notes (Signed)
Utilization review completed.  

## 2014-09-01 NOTE — Op Note (Signed)
[name], [name]               ACCOUNT NO.:  [id number]  MEDICAL RECORD NO.:  [id number]  LOCATION:  1614                         FACILITY:  [facility]  PHYSICIAN:  Madlyn Frankel. Charlann Boxer, M.D.  DATE OF BIRTH:  1965-08-19  DATE OF PROCEDURE:  08/31/2014 DATE OF DISCHARGE:                              OPERATIVE REPORT   PREOPERATIVE DIAGNOSIS:  Failed right total knee replacement.  POSTOPERATIVE DIAGNOSIS/FINDINGS:  Diagnosis of failed total knee arthroplasty.  FINDINGS:  I did not see any gross obvious failure of this right total knee replacement.  Under direct inspection, there was no movement of any of the components.  No evidence of any obvious issues other than perhaps some ligament play allowing for this dynamic valgus that she had felt. I did increase polys up from a 10 to 14 and then 16 where I found the ligaments to be stable.  Again, I found no gross motion of the components between the bone-cement interface and bone or the component interface to the cement.  PROCEDURE:  Revision of right knee with synovectomy and polyethylene exchange from size 4 x 10 mm to 4 x 16 mm insert.  SURGEON:  Madlyn Frankel. Charlann Boxer, M.D.  ASSISTANT:  Mary Gins, PA-C.  Note that, Mr. [name] was present for the entirety of the case from preoperative positioning, perioperative management of the operative extremity, general facilitation of the case, and primary wound closure.  ANESTHESIA:  Spinal.  SPECIMENS:  None.  COMPLICATIONS:  None.  DRAINS:  None.  TOURNIQUET TIME:  35 minutes at 250 mmHg.  INDICATIONS FOR PROCEDURE:  [name] is a 49 year old female, who was referred for surgical consideration of her right knee.  She had gone through a long bout of conservative trials including cortisone and Viscosupplementation, and ultimately was referred for consideration based on valgus knee.  In November 2015, she underwent a primary right total knee arthroplasty using DePuy Attune component.   In the postoperative period initially she had stated that she was doing very well.  She definitely by the 63-month appointment felt that she was doing very well.  She presented to the office about 4 to 6 weeks ago with increasing pain and concern from therapist about a more valgus-appearing knee.  This was only at this point probably 5 months out from the surgery.  Given the limitations of bone scans, we did order one, it did have increased uptake, but the interpretation was difficult to evaluate fully, and this was discussed with the patient at length.  I did do lab work that showed a CRP of 2.1, slightly above normal and a sed rate of 15.  Again, difficult to determine.  Aspiration of the knee joint revealed no growth with normal white count.  Given all these findings and the persistent pain, however, she wished at this point to proceed with surgical intervention.  I discussed with her the options available and the clinical assessment need to go forward. In preparation I had available to revise a single component versus revising the entire knee replacement.  Converting her to a total knee revision would require changing out the Attune system, which currently does not have a revision component to a  DePuy rotating platform Sigma knee.  Risks of infection, DVT, component failure, persistent pain were all reviewed and discussed, consent was obtained.  PROCEDURE IN DETAIL:  The patient was brought to the operative theater. Once adequate anesthesia including Ancef and vancomycin due to MRSA positive screen, she was positioned supine with the right thigh tourniquet placed.  The right lower extremity was then prepped and draped in a sterile fashion.  Time-out was performed identifying the patient, planned procedure, and extremity.  The patient's old incision was identified preoperatively and then incised.  Soft tissue planes created.  Slight extension of the incision proximally was  required. Median arthrotomy was then carried out at this point encountering clear synovial fluid.  No signs of purulence or whatsoever.  Following the synovectomy and scar debridement over the medial, lateral, and suprapatellar regions, I removed the old polyethylene insert.  Gross inspection of both the femoral and tibial components identified no significant pathology.  Based on the acuity of her symptoms, I was totally expecting it to be something very loose and nothing was loose to probe examination as well as to direct pressure, which usually will see some movement between the bone and cement in these settings.  This made this a very big struggle to me as I at 49 years of age did not feel like revising her or taking out her components that were well fixed would be a great idea.  I thus made a decision at this point to retry polyethylene and went from a 10 to a 14 mm insert initially.  The knee came to full extension.  There was still little bit of plate laterally. I decided to go to a 16, which I felt provided a stable insert.  Again, I found this very difficult to explain intraoperatively.  I made a decision at this point to proceed with the final insert as a poly exchange only and to re-evaluate.  Upon deciding this final variable, we irrigated the knee with normal saline solution.  I then placed the final size 4 x 16 mm posterior stabilized AOX insert into the knee.  The knee was re-irrigated.  The extensor mechanism reapproximated with a combination of #1 Vicryl and 0 V-Loc.  The remainder of the wound with 2- 0 Vicryl and a running 3-0 Monocryl.  The knee was then cleaned and dressed sterilely with Aquacel dressing and wrapped in Ace wrap, placed in knee immobilizer for comfort.  Findings were reviewed with family and the patient in the morning.     Madlyn Frankel Charlann Boxer, M.D.     MDO/MEDQ  D:  08/31/2014  T:  09/01/2014  Job:  [id number]

## 2014-09-01 NOTE — Discharge Instructions (Signed)

## 2014-09-01 NOTE — Progress Notes (Signed)
Physical Therapy Treatment Patient Details Name: [name] MRN: [id number] DOB: 07/03/1965 Today's Date: 09/01/2014    History of Present Illness R TKA revisioin    PT Comments    Reviewed stairs and don/doff KI with pt and spouse.  Follow Up Recommendations  Home health PT     Equipment Recommendations  None recommended by PT    Recommendations for Other Services OT consult     Precautions / Restrictions Precautions Precautions: Knee Required Braces or Orthoses: Knee Immobilizer - Right Knee Immobilizer - Right: On when out of bed or walking Restrictions Weight Bearing Restrictions: No Other Position/Activity Restrictions: WBAT    Mobility  Bed Mobility Overal bed mobility: Needs Assistance Bed Mobility: Sit to Supine       Sit to supine: Min guard   General bed mobility comments: min cues for sequence and management of R LE  Transfers Overall transfer level: Needs assistance Equipment used: Rolling walker (2 wheeled) Transfers: Sit to/from Stand Sit to Stand: Min guard         General transfer comment: Cues for LE management and use of UEs to self assist  Ambulation/Gait Ambulation/Gait assistance: Min guard;Supervision Ambulation Distance (Feet): 48 Feet Assistive device: Rolling walker (2 wheeled) Gait Pattern/deviations: Step-to pattern;Decreased step length - right;Decreased step length - left;Shuffle     General Gait Details: cues for posture, position from RW and sequence   Stairs Stairs: Yes Stairs assistance: Min assist Stair Management: One rail Left;Step to pattern;Forwards;With crutches Number of Stairs: 15 General stair comments: 15 stairs total with cues for sequence and foot/crutch placement.  Spouse present  Wheelchair Mobility    Modified Rankin (Stroke Patients Only)       Balance                                    Cognition Arousal/Alertness: Awake/alert Behavior During Therapy: WFL for tasks  assessed/performed Overall Cognitive Status: Within Functional Limits for tasks assessed                      Exercises Total Joint Exercises Ankle Circles/Pumps: AROM;Both;15 reps;Supine Quad Sets: AROM;Both;10 reps;Supine Heel Slides: AAROM;Right;Supine;10 reps Straight Leg Raises: AROM;Right;Supine;10 reps    General Comments        Pertinent Vitals/Pain Pain Assessment: 0-10 Pain Score: 4  Pain Location: R knee Pain Descriptors / Indicators: Aching;Sore Pain Intervention(s): Limited activity within patient's tolerance;Monitored during session;Premedicated before session;Ice applied    Home Living                      Prior Function            PT Goals (current goals can now be found in the care plan section) Acute Rehab PT Goals Patient Stated Goal: To have a straight knee and less pain when I walk PT Goal Formulation: With patient Time For Goal Achievement: 09/04/14 Potential to Achieve Goals: Good Progress towards PT goals: Progressing toward goals    Frequency  7X/week    PT Plan Current plan remains appropriate    Co-evaluation             End of Session Equipment Utilized During Treatment: Gait belt;Right knee immobilizer Activity Tolerance: Patient tolerated treatment well Patient left: in bed;with call bell/phone within reach;with family/visitor present     Time: 2353-6144 PT Time Calculation (min) (ACUTE ONLY): 35 min  Charges:  $  Gait Training: 8-22 mins $Therapeutic Exercise: 8-22 mins                    G Codes:      Margarite Vessel 09/23/2014, 4:02 PM

## 2014-09-02 ENCOUNTER — Encounter (HOSPITAL_COMMUNITY): Payer: Self-pay | Admitting: Orthopedic Surgery

## 2015-04-20 ENCOUNTER — Ambulatory Visit: Payer: BLUE CROSS/BLUE SHIELD | Admitting: Podiatry

## 2015-06-14 DIAGNOSIS — M216X1 Other acquired deformities of right foot: Secondary | ICD-10-CM | POA: Diagnosis not present

## 2015-06-14 DIAGNOSIS — M94269 Chondromalacia, unspecified knee: Secondary | ICD-10-CM | POA: Diagnosis not present

## 2015-07-03 DIAGNOSIS — F3342 Major depressive disorder, recurrent, in full remission: Secondary | ICD-10-CM | POA: Diagnosis not present

## 2015-07-03 DIAGNOSIS — F410 Panic disorder [episodic paroxysmal anxiety] without agoraphobia: Secondary | ICD-10-CM | POA: Diagnosis not present

## 2015-07-26 DIAGNOSIS — Z1389 Encounter for screening for other disorder: Secondary | ICD-10-CM | POA: Diagnosis not present

## 2015-07-26 DIAGNOSIS — M545 Low back pain: Secondary | ICD-10-CM | POA: Diagnosis not present

## 2015-07-26 DIAGNOSIS — G8929 Other chronic pain: Secondary | ICD-10-CM | POA: Diagnosis not present

## 2015-07-26 DIAGNOSIS — M542 Cervicalgia: Secondary | ICD-10-CM | POA: Diagnosis not present

## 2015-07-26 DIAGNOSIS — Z79891 Long term (current) use of opiate analgesic: Secondary | ICD-10-CM | POA: Diagnosis not present

## 2015-07-28 DIAGNOSIS — M1712 Unilateral primary osteoarthritis, left knee: Secondary | ICD-10-CM | POA: Diagnosis not present

## 2015-07-28 DIAGNOSIS — M25562 Pain in left knee: Secondary | ICD-10-CM | POA: Diagnosis not present

## 2015-07-28 DIAGNOSIS — Z471 Aftercare following joint replacement surgery: Secondary | ICD-10-CM | POA: Diagnosis not present

## 2015-07-28 DIAGNOSIS — Z96651 Presence of right artificial knee joint: Secondary | ICD-10-CM | POA: Diagnosis not present

## 2015-08-11 DIAGNOSIS — Z6839 Body mass index (BMI) 39.0-39.9, adult: Secondary | ICD-10-CM | POA: Diagnosis not present

## 2015-08-11 DIAGNOSIS — N390 Urinary tract infection, site not specified: Secondary | ICD-10-CM | POA: Diagnosis not present

## 2015-08-11 DIAGNOSIS — R52 Pain, unspecified: Secondary | ICD-10-CM | POA: Diagnosis not present

## 2015-08-18 ENCOUNTER — Ambulatory Visit (INDEPENDENT_AMBULATORY_CARE_PROVIDER_SITE_OTHER): Payer: BLUE CROSS/BLUE SHIELD | Admitting: Podiatry

## 2015-08-18 ENCOUNTER — Encounter: Payer: Self-pay | Admitting: Podiatry

## 2015-08-18 ENCOUNTER — Ambulatory Visit (INDEPENDENT_AMBULATORY_CARE_PROVIDER_SITE_OTHER): Payer: BLUE CROSS/BLUE SHIELD

## 2015-08-18 VITALS — BP 121/80 | HR 74 | Resp 16

## 2015-08-18 DIAGNOSIS — L600 Ingrowing nail: Secondary | ICD-10-CM | POA: Diagnosis not present

## 2015-08-18 DIAGNOSIS — M2021 Hallux rigidus, right foot: Secondary | ICD-10-CM | POA: Diagnosis not present

## 2015-08-18 DIAGNOSIS — M79676 Pain in unspecified toe(s): Secondary | ICD-10-CM | POA: Diagnosis not present

## 2015-08-18 NOTE — Patient Instructions (Signed)

## 2015-08-18 NOTE — Progress Notes (Signed)
Subjective:     Patient ID: [name], female   DOB: 09/16/1965, 50 y.o.   MRN: [id number]  HPI patient presents with a thickened damaged hallux nails of both feet that are painful and presents with pain in the first metatarsal head right with a history of having the left one fixed by me 5 years ago. There is reduced range of motion and she states he gets sore and she tries shoe gear modifications without relief   Review of Systems  All other systems reviewed and are negative.      Objective:   Physical Exam  Constitutional: She is oriented to person, place, and time.  Cardiovascular: Intact distal pulses.   Musculoskeletal: Normal range of motion.  Neurological: She is oriented to person, place, and time.  Skin: Skin is warm.  Nursing note and vitals reviewed.  neurovascular status intact muscle strength adequate range of motion within normal limits with patient noted to have incurvated thickened hallux nails of both feet that are painful when pressed and is noted to have reduced range of motion first MPJ right with narrowing of the joint surface and pain when the joint is palpated. Patient's found to have good digital perfusion and is well oriented 3     Assessment:     Damaged hallux nails bilateral with pain in the corners and hallux limitus rigidus deformity right with pain with well-healed surgical site left from previous surgery    Plan:     H&P and x-rays of both feet reviewed with patient. Today I went ahead discuss nail removal and she wants this done and I explained procedures and risk. I infiltrated each hallux 60 mg Xylocaine Marcaine mixture remove the hallux nails exposed matrix and applied phenol 3 applications 30 seconds followed by alcohol lavage and sterile dressing. Gave instructions on soaks and we discussed correction of the right foot which we will do in the future  X-ray report indicates elevation of the first metatarsal segment right with narrowing of the  joint and the beginnings of spur formation dorsal. Left shows well-healed surgical site with pins in place

## 2015-08-18 NOTE — Progress Notes (Signed)
   Subjective:    Patient ID: [name], female    DOB: [birth year], 50 y.o.   MRN: [id number]  HPI    Review of Systems  All other systems reviewed and are negative.      Objective:   Physical Exam        Assessment & Plan:

## 2015-08-23 DIAGNOSIS — M542 Cervicalgia: Secondary | ICD-10-CM | POA: Diagnosis not present

## 2015-08-23 DIAGNOSIS — Z79891 Long term (current) use of opiate analgesic: Secondary | ICD-10-CM | POA: Diagnosis not present

## 2015-08-23 DIAGNOSIS — M545 Low back pain: Secondary | ICD-10-CM | POA: Diagnosis not present

## 2015-08-23 DIAGNOSIS — G8929 Other chronic pain: Secondary | ICD-10-CM | POA: Diagnosis not present

## 2015-09-01 ENCOUNTER — Ambulatory Visit (INDEPENDENT_AMBULATORY_CARE_PROVIDER_SITE_OTHER): Payer: BLUE CROSS/BLUE SHIELD

## 2015-09-01 ENCOUNTER — Ambulatory Visit (INDEPENDENT_AMBULATORY_CARE_PROVIDER_SITE_OTHER): Payer: BLUE CROSS/BLUE SHIELD | Admitting: Podiatry

## 2015-09-01 DIAGNOSIS — S93601A Unspecified sprain of right foot, initial encounter: Secondary | ICD-10-CM

## 2015-09-01 DIAGNOSIS — M2021 Hallux rigidus, right foot: Secondary | ICD-10-CM | POA: Diagnosis not present

## 2015-09-01 NOTE — Patient Instructions (Signed)

## 2015-09-02 ENCOUNTER — Ambulatory Visit: Payer: BLUE CROSS/BLUE SHIELD | Admitting: Podiatry

## 2015-09-02 NOTE — Progress Notes (Signed)
Subjective:     Patient ID: [name], female   DOB: [birth year], 50 y.o.   MRN: [id number]  HPI patient states that she injured her right foot last week and twisted it and it's been swelling and she's concerned about fracture. Also states that she no she is can he need foot surgery on her right big toe joint   Review of Systems     Objective:   Physical Exam Neurovascular status intact muscle strength adequate range of motion within normal limits with patient found to have midfoot edema right at the Lisfranc's joint with pain and also is noted to have hyperostosis medial dorsal aspect first metatarsal head right that's painful when pressed and makes walking and shoe gear difficult    Assessment:     Traumatized right mid foot with possibility for Lisfranc's injury and hallux limitus deformity right    Plan:     H&P x-rays reviewed with patient. At this point air fracture walker dispensed with instructions on ice elevation and compression and we discussed surgical correction of the right foot that she will be wearing this boot for a period of time due to the midfoot injury. Patient wants to get this done due to the situation she is in and I allowed her to read consent form reviewing the alternative treatments and complications associated with this procedure. Patient signed consent form after extensive review understanding that recovery can take 6 months to one year and that the possibility exists long-term for joint implantation or fusion procedure. Patient was given all instructions on air fracture walker usage  X-ray report indicates there is spurring around the first metatarsal head right with narrowing of the joint surface and no indications of Lisfranc injury right with swelling noted of the midfoot within the soft tissue

## 2015-09-06 ENCOUNTER — Telehealth: Payer: Self-pay | Admitting: *Deleted

## 2015-09-06 NOTE — Telephone Encounter (Signed)
I attempted to inform patient that we do not have her scheduled for surgery tomorrow.  There was a poor connection.

## 2015-09-06 NOTE — Telephone Encounter (Signed)
"  I was in the office to see Dr. Charlsie Merlesegal.  I want to make sure my surgery was canceled for the 27th.  I called my husband and he said he couldn't do it.  Call me and let me know it was canceled."

## 2015-09-07 ENCOUNTER — Telehealth: Payer: Self-pay | Admitting: *Deleted

## 2015-09-07 NOTE — Telephone Encounter (Signed)
"  Hello this is [name].  If you could call me back, we'll get a clearer line."

## 2015-09-07 NOTE — Telephone Encounter (Signed)
I left her a message that we had already canceled her surgery.  Please call if you have any other questions.

## 2015-09-07 NOTE — Telephone Encounter (Signed)
"  I'm sorry I missed your call.  I couldn't hear you at all.  I just wanted to make sure tomorrow is canceled.  We're out of town.  I didn't speak to my husband before we decided to make the change and do the surgery.  I think we're going to wait until the fall.  I don't want to be not available and shown as a no show.  You can try me later."

## 2015-09-29 ENCOUNTER — Telehealth: Payer: Self-pay | Admitting: *Deleted

## 2015-09-29 NOTE — Telephone Encounter (Signed)
Pt states she had a toenail removed 08/18/2015 and didn't know if she needed a follow up appt. I told pt if the toenail areas look good no need for an appt, but it may continue to drain for over 4-6 weeks, switch to epsom salt 1/2 C in 1 Qt warm water daily and cover with lightly coated antibiotic bandaid when walking around or in enclosed shoes, but allow to air dry, continue these soaks until the area has a hard dry scab.  Call with concerns. Pt states understanding.

## 2015-10-11 DIAGNOSIS — Z6836 Body mass index (BMI) 36.0-36.9, adult: Secondary | ICD-10-CM | POA: Diagnosis not present

## 2015-10-11 DIAGNOSIS — N200 Calculus of kidney: Secondary | ICD-10-CM | POA: Diagnosis not present

## 2015-10-11 DIAGNOSIS — Z87442 Personal history of urinary calculi: Secondary | ICD-10-CM | POA: Diagnosis not present

## 2015-10-11 DIAGNOSIS — N390 Urinary tract infection, site not specified: Secondary | ICD-10-CM | POA: Diagnosis not present

## 2015-11-17 DIAGNOSIS — F329 Major depressive disorder, single episode, unspecified: Secondary | ICD-10-CM | POA: Diagnosis not present

## 2015-11-17 DIAGNOSIS — N390 Urinary tract infection, site not specified: Secondary | ICD-10-CM | POA: Diagnosis not present

## 2015-11-17 DIAGNOSIS — Z23 Encounter for immunization: Secondary | ICD-10-CM | POA: Diagnosis not present

## 2015-11-17 DIAGNOSIS — K861 Other chronic pancreatitis: Secondary | ICD-10-CM | POA: Diagnosis not present

## 2015-11-17 DIAGNOSIS — E559 Vitamin D deficiency, unspecified: Secondary | ICD-10-CM | POA: Diagnosis not present

## 2015-11-17 DIAGNOSIS — E782 Mixed hyperlipidemia: Secondary | ICD-10-CM | POA: Diagnosis not present

## 2015-11-17 DIAGNOSIS — G8929 Other chronic pain: Secondary | ICD-10-CM | POA: Diagnosis not present

## 2015-11-17 DIAGNOSIS — E039 Hypothyroidism, unspecified: Secondary | ICD-10-CM | POA: Diagnosis not present

## 2015-11-19 DIAGNOSIS — Z96651 Presence of right artificial knee joint: Secondary | ICD-10-CM | POA: Diagnosis not present

## 2015-11-19 DIAGNOSIS — Z471 Aftercare following joint replacement surgery: Secondary | ICD-10-CM | POA: Diagnosis not present

## 2015-11-19 DIAGNOSIS — M1712 Unilateral primary osteoarthritis, left knee: Secondary | ICD-10-CM | POA: Diagnosis not present

## 2015-11-22 DIAGNOSIS — K862 Cyst of pancreas: Secondary | ICD-10-CM | POA: Diagnosis not present

## 2015-11-22 DIAGNOSIS — K5903 Drug induced constipation: Secondary | ICD-10-CM | POA: Diagnosis not present

## 2015-11-22 DIAGNOSIS — K861 Other chronic pancreatitis: Secondary | ICD-10-CM | POA: Diagnosis not present

## 2015-12-03 ENCOUNTER — Ambulatory Visit: Payer: Self-pay | Admitting: Orthopedic Surgery

## 2015-12-03 NOTE — Progress Notes (Signed)
Scheduling for pre op- please place SURGICAL ORDERS IN EPIC  thanks 

## 2015-12-06 ENCOUNTER — Encounter (HOSPITAL_COMMUNITY): Payer: Self-pay

## 2015-12-06 NOTE — Patient Instructions (Addendum)
[name] [facility]  12/06/2015   Your procedure is scheduled on: 12/13/2015    [facility]  Entrance take [place]  elevators to 3rd floor to  Short Stay Center at    0940 AM.  Call this number if you have problems the morning of surgery [phone]   Remember: ONLY 1 PERSON MAY GO WITH YOU TO SHORT STAY TO GET  READY MORNING OF YOUR SURGERY.  Do not eat food or drink liquids :After Midnight.     Take these medicines the morning of surgery with A SIP OF WATER: Proventil Inhaler if needed and bring, Xanax, Qvar inhaler if needed and bring, Wellbutrin, cymbalta, , Synthroid, Oxycodone if needed, Lyrica, Nucynta,Oxycodone                                  You may not have any metal on your body including hair pins and              piercings  Do not wear jewelry, make-up, lotions, powders or perfumes, deodorant             Do not wear nail polish.  Do not shave  48 hours prior to surgery.                 Do not bring valuables to the hospital. [place] IS NOT             RESPONSIBLE   FOR VALUABLES.  Contacts, dentures or bridgework may not be worn into surgery.  Leave suitcase in the car. After surgery it may be brought to your room.         Special Instructions: N/A              Please read over the following fact sheets you were given: _____________________________________________________________________             [facility] - Preparing for [facility] Before surgery, you can play an important role.  Because skin is not sterile, your skin needs to be as free of germs as possible.  You can reduce the number of germs on your skin by washing with CHG (chlorahexidine gluconate) soap before surgery.  CHG is an antiseptic cleaner which kills germs and bonds with the skin to continue killing germs even after washing. Please DO NOT use if you have an allergy to CHG or antibacterial soaps.  If your skin becomes reddened/irritated stop using the CHG and  inform your nurse when you arrive at Short Stay. Do not shave (including legs and underarms) for at least 48 hours prior to the first CHG shower.  You may shave your face/neck. Please follow these instructions carefully:  1.  Shower with CHG Soap the night before surgery and the  morning of Surgery.  2.  If you choose to wash your hair, wash your hair first as usual with your  normal  shampoo.  3.  After you shampoo, rinse your hair and body thoroughly to remove the  shampoo.                           4.  Use CHG as you would any other liquid soap.  You can apply chg directly  to the skin and wash  Gently with a scrungie or clean washcloth.  5.  Apply the CHG Soap to your body ONLY FROM THE NECK DOWN.   Do not use on face/ open                           Wound or open sores. Avoid contact with eyes, ears mouth and genitals (private parts).                       Wash face,  Genitals (private parts) with your normal soap.             6.  Wash thoroughly, paying special attention to the area where your surgery  will be performed.  7.  Thoroughly rinse your body with warm water from the neck down.  8.  DO NOT shower/wash with your normal soap after using and rinsing off  the CHG Soap.                9.  Pat yourself dry with a clean towel.            10.  Wear clean pajamas.            11.  Place clean sheets on your bed the night of your first shower and do not  sleep with pets. Day of Surgery : Do not apply any lotions/deodorants the morning of surgery.  Please wear clean clothes to the hospital/surgery center.  FAILURE TO FOLLOW THESE INSTRUCTIONS MAY RESULT IN THE CANCELLATION OF YOUR SURGERY PATIENT SIGNATURE_________________________________  NURSE SIGNATURE__________________________________  ________________________________________________________________________  WHAT IS A BLOOD TRANSFUSION? Blood Transfusion Information  A transfusion is the replacement of blood or  some of its parts. Blood is made up of multiple cells which provide different functions.  Red blood cells carry oxygen and are used for blood loss replacement.  White blood cells fight against infection.  Platelets control bleeding.  Plasma helps clot blood.  Other blood products are available for specialized needs, such as hemophilia or other clotting disorders. BEFORE THE TRANSFUSION  Who gives blood for transfusions?   Healthy volunteers who are fully evaluated to make sure their blood is safe. This is blood bank blood. Transfusion therapy is the safest it has ever been in the practice of medicine. Before blood is taken from a donor, a complete history is taken to make sure that person has no history of diseases nor engages in risky social behavior (examples are intravenous drug use or sexual activity with multiple partners). The donor's travel history is screened to minimize risk of transmitting infections, such as malaria. The donated blood is tested for signs of infectious diseases, such as HIV and hepatitis. The blood is then tested to be sure it is compatible with you in order to minimize the chance of a transfusion reaction. If you or a relative donates blood, this is often done in anticipation of surgery and is not appropriate for emergency situations. It takes many days to process the donated blood. RISKS AND COMPLICATIONS Although transfusion therapy is very safe and saves many lives, the main dangers of transfusion include:   Getting an infectious disease.  Developing a transfusion reaction. This is an allergic reaction to something in the blood you were given. Every precaution is taken to prevent this. The decision to have a blood transfusion has been considered carefully by your caregiver before blood is given. Blood is not given unless the benefits outweigh  the risks. AFTER THE TRANSFUSION  Right after receiving a blood transfusion, you will usually feel much better and more  energetic. This is especially true if your red blood cells have gotten low (anemic). The transfusion raises the level of the red blood cells which carry oxygen, and this usually causes an energy increase.  The nurse administering the transfusion will monitor you carefully for complications. HOME CARE INSTRUCTIONS  No special instructions are needed after a transfusion. You may find your energy is better. Speak with your caregiver about any limitations on activity for underlying diseases you may have. SEEK MEDICAL CARE IF:   Your condition is not improving after your transfusion.  You develop redness or irritation at the intravenous (IV) site. SEEK IMMEDIATE MEDICAL CARE IF:  Any of the following symptoms occur over the next 12 hours:  Shaking chills.  You have a temperature by mouth above 102 F (38.9 C), not controlled by medicine.  Chest, back, or muscle pain.  People around you feel you are not acting correctly or are confused.  Shortness of breath or difficulty breathing.  Dizziness and fainting.  You get a rash or develop hives.  You have a decrease in urine output.  Your urine turns a dark color or changes to pink, red, or brown. Any of the following symptoms occur over the next 10 days:  You have a temperature by mouth above 102 F (38.9 C), not controlled by medicine.  Shortness of breath.  Weakness after normal activity.  The white part of the eye turns yellow (jaundice).  You have a decrease in the amount of urine or are urinating less often.  Your urine turns a dark color or changes to pink, red, or brown. Document Released: 02/25/2000 Document Revised: 05/22/2011 Document Reviewed: 10/14/2007 ExitCare Patient Information 2014 [place].  _______________________________________________________________________  Incentive Spirometer  An incentive spirometer is a tool that can help keep your lungs clear and active. This tool measures how well you are  filling your lungs with each breath. Taking long deep breaths may help reverse or decrease the chance of developing breathing (pulmonary) problems (especially infection) following:  A long period of time when you are unable to move or be active. BEFORE THE PROCEDURE   If the spirometer includes an indicator to show your best effort, your nurse or respiratory therapist will set it to a desired goal.  If possible, sit up straight or lean slightly forward. Try not to slouch.  Hold the incentive spirometer in an upright position. INSTRUCTIONS FOR USE  1. Sit on the edge of your bed if possible, or sit up as far as you can in bed or on a chair. 2. Hold the incentive spirometer in an upright position. 3. Breathe out normally. 4. Place the mouthpiece in your mouth and seal your lips tightly around it. 5. Breathe in slowly and as deeply as possible, raising the piston or the ball toward the top of the column. 6. Hold your breath for 3-5 seconds or for as long as possible. Allow the piston or ball to fall to the bottom of the column. 7. Remove the mouthpiece from your mouth and breathe out normally. 8. Rest for a few seconds and repeat Steps 1 through 7 at least 10 times every 1-2 hours when you are awake. Take your time and take a few normal breaths between deep breaths. 9. The spirometer may include an indicator to show your best effort. Use the indicator as a goal to work  toward during each repetition. 10. After each set of 10 deep breaths, practice coughing to be sure your lungs are clear. If you have an incision (the cut made at the time of surgery), support your incision when coughing by placing a pillow or rolled up towels firmly against it. Once you are able to get out of bed, walk around indoors and cough well. You may stop using the incentive spirometer when instructed by your caregiver.  RISKS AND COMPLICATIONS  Take your time so you do not get dizzy or light-headed.  If you are in pain,  you may need to take or ask for pain medication before doing incentive spirometry. It is harder to take a deep breath if you are having pain. AFTER USE  Rest and breathe slowly and easily.  It can be helpful to keep track of a log of your progress. Your caregiver can provide you with a simple table to help with this. If you are using the spirometer at home, follow these instructions: [place] IF:   You are having difficultly using the spirometer.  You have trouble using the spirometer as often as instructed.  Your pain medication is not giving enough relief while using the spirometer.  You develop fever of 100.5 F (38.1 C) or higher. SEEK IMMEDIATE MEDICAL CARE IF:   You cough up bloody sputum that had not been present before.  You develop fever of 102 F (38.9 C) or greater.  You develop worsening pain at or near the incision site. MAKE SURE YOU:   Understand these instructions.  Will watch your condition.  Will get help right away if you are not doing well or get worse. Document Released: 07/10/2006 Document Revised: 05/22/2011 Document Reviewed: 09/10/2006 [facility] Patient Information 2014 [place], [place].   ________________________________________________________________________

## 2015-12-07 ENCOUNTER — Inpatient Hospital Stay (HOSPITAL_COMMUNITY): Admission: RE | Admit: 2015-12-07 | Payer: Self-pay | Source: Ambulatory Visit

## 2015-12-07 ENCOUNTER — Ambulatory Visit: Payer: Self-pay | Admitting: Orthopedic Surgery

## 2015-12-07 ENCOUNTER — Encounter (HOSPITAL_COMMUNITY): Payer: Self-pay

## 2015-12-07 ENCOUNTER — Encounter (HOSPITAL_COMMUNITY)
Admission: RE | Admit: 2015-12-07 | Discharge: 2015-12-07 | Disposition: A | Discharge status: Home / Self Care | Payer: BLUE CROSS/BLUE SHIELD | Source: Ambulatory Visit | Attending: Orthopedic Surgery | Admitting: Orthopedic Surgery

## 2015-12-07 DIAGNOSIS — M1712 Unilateral primary osteoarthritis, left knee: Secondary | ICD-10-CM | POA: Diagnosis not present

## 2015-12-07 DIAGNOSIS — Z01812 Encounter for preprocedural laboratory examination: Secondary | ICD-10-CM | POA: Insufficient documentation

## 2015-12-07 LAB — COMPREHENSIVE METABOLIC PANEL
ALBUMIN: 3.9 g/dL (ref 3.5–5.0)
ALT: 20 U/L (ref 14–54)
AST: 19 U/L (ref 15–41)
Alkaline Phosphatase: 77 U/L (ref 38–126)
Anion gap: 7 (ref 5–15)
BILIRUBIN TOTAL: 0.5 mg/dL (ref 0.3–1.2)
BUN: 18 mg/dL (ref 6–20)
CHLORIDE: 103 mmol/L (ref 101–111)
CO2: 30 mmol/L (ref 22–32)
CREATININE: 0.9 mg/dL (ref 0.44–1.00)
Calcium: 9 mg/dL (ref 8.9–10.3)
GFR calc Af Amer: >60 mL/min (ref >60)
GLUCOSE: 116 mg/dL — AB (ref 65–99)
Potassium: 3.7 mmol/L (ref 3.5–5.1)
Sodium: 140 mmol/L (ref 135–145)
Total Protein: 6.8 g/dL (ref 6.5–8.1)

## 2015-12-07 LAB — URINALYSIS, ROUTINE W REFLEX MICROSCOPIC
BILIRUBIN URINE: NEGATIVE
Glucose, UA: NEGATIVE mg/dL
Hgb urine dipstick: NEGATIVE
KETONES UR: NEGATIVE mg/dL
LEUKOCYTES UA: NEGATIVE
NITRITE: NEGATIVE
PH: 6 (ref 5.0–8.0)
PROTEIN: NEGATIVE mg/dL
Specific Gravity, Urine: 1.028 (ref 1.005–1.030)

## 2015-12-07 LAB — CBC
HEMATOCRIT: 37.5 % (ref 36.0–46.0)
Hemoglobin: 12.1 g/dL (ref 12.0–15.0)
MCH: 26 pg (ref 26.0–34.0)
MCHC: 32.3 g/dL (ref 30.0–36.0)
MCV: 80.6 fL (ref 78.0–100.0)
PLATELETS: 274 10*3/uL (ref 150–400)
RBC: 4.65 MIL/uL (ref 3.87–5.11)
RDW: 14.3 % (ref 11.5–15.5)
WBC: 5.4 10*3/uL (ref 4.0–10.5)

## 2015-12-07 LAB — PROTIME-INR
INR: 0.99
PROTHROMBIN TIME: 13.1 s (ref 11.4–15.2)

## 2015-12-07 LAB — SURGICAL PCR SCREEN
MRSA, PCR: POSITIVE — AB
STAPHYLOCOCCUS AUREUS: POSITIVE — AB

## 2015-12-07 LAB — APTT: aPTT: 38 seconds — ABNORMAL HIGH (ref 24–36)

## 2015-12-07 MED ORDER — VANCOMYCIN HCL 10 G IV SOLR: 1000.0000 mg | Freq: Once | INTRAVENOUS | Status: AC

## 2015-12-08 DIAGNOSIS — E782 Mixed hyperlipidemia: Secondary | ICD-10-CM | POA: Diagnosis not present

## 2015-12-08 DIAGNOSIS — M1712 Unilateral primary osteoarthritis, left knee: Secondary | ICD-10-CM | POA: Diagnosis not present

## 2015-12-08 DIAGNOSIS — Z01810 Encounter for preprocedural cardiovascular examination: Secondary | ICD-10-CM | POA: Diagnosis not present

## 2015-12-08 DIAGNOSIS — Z6838 Body mass index (BMI) 38.0-38.9, adult: Secondary | ICD-10-CM | POA: Diagnosis not present

## 2015-12-12 NOTE — H&P (Signed)
TOTAL KNEE ADMISSION H&P  Patient is being admitted for left total knee arthroplasty.  Subjective:  Chief Complaint:left knee pain.  HPI: [name], 50 y.o. female, has a history of pain and functional disability in the left knee due to arthritis and has failed non-surgical conservative treatments for greater than 12 weeks to includeNSAID's and/or analgesics, corticosteriod injections, use of assistive devices and activity modification.  Onset of symptoms was gradual, starting 5 years ago with gradually worsening course since that time. The patient noted no past surgery on the left knee(s).  Patient currently rates pain in the left knee(s) at 9 out of 10 with activity. Patient has night pain, worsening of pain with activity and weight bearing, pain that interferes with activities of daily living, pain with passive range of motion, crepitus and joint swelling.  Patient has evidence of periarticular osteophytes and joint space narrowing by imaging studies.  There is no active infection.  Patient Active Problem List   Diagnosis Date Noted  . Obese 09/01/2014  . 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 S/P revision of total knee 08/31/2014  . S/P right TKA 01/13/2014  . S/P knee replacement 01/13/2014  . Acute bronchitis 04/28/2013  . Chest pain 04/28/2013  . Chronic cough 02/02/2013  . Hypoxia in the setting of recent bronchitis and cough 06/07/2012  . Toxic encephalopathy 06/07/2012  . Leukocytosis 07/27/2011  . Metabolic encephalopathy 07/27/2011  . UTI (lower urinary tract infection) 07/27/2011  . Hx of pancreatitis 07/27/2011  . Anemia 07/27/2011  . Hypothyroid 07/27/2011  . Depression 07/27/2011  . GERD (gastroesophageal reflux disease) 07/27/2011  . Pancreatic pseudocyst 07/27/2011   Past Medical History:  Diagnosis Date  . Anemia   . Anxiety   . Bronchitis    hx of   . Cervical pain (neck)   . Chronic pancreatitis (HCC)   . Constipation   . DDD (degenerative disc  disease), cervical   . Depression   . GERD (gastroesophageal reflux disease)   . History of kidney stones   . History of pancreatitis 01-04-2011  . Hypothyroidism   . IBS (irritable bowel syndrome)   . Nausea DUE TO PAIN FROM KIDNEY STONE  . Pancreatic pseudocyst   . Pneumonia    hx of pneumonia   . PONV (postoperative nausea and vomiting)   . Right flank pain   . Right knee pain   . Right ureteral stone   . Swelling of right knee joint   . Urinary tract infection    hx of     Past Surgical History:  Procedure Laterality Date  . ABDOMINAL HYSTERECTOMY    . ANTERIOR LUMBAR FUSION  11-09-2010    L5 - S1  . BACK SURGERY  2012   lower back surgery   . CERVICAL FUSION  1999   C4 - 7  . CERVICAL SPINE SURGERY  APRIL 2011   REMOVAL SCAR TISSUE  . colonscopy     . CYSTO/ RIGHT RETROGRADE PYELOGRAM/ RIGHT URETEROSCOPY AND STENT PLACEMENT  09-24-2009   HX RIGHT URETERAL CALCULUS  . CYSTOSCOPY WITH URETEROSCOPY  12/20/2011   Procedure: CYSTOSCOPY WITH URETEROSCOPY;  Surgeon: Milford Cage, MD;  Location: [facility];  Service: Urology;  Laterality: Right;  . DIAGNOSTIC LAPAROSCOPY  1992   ENDOMETRIOSIS  . EUS  03/01/2011   Procedure: UPPER ENDOSCOPIC ULTRASOUND (EUS) LINEAR;  Surgeon: Freddy Jaksch, MD;  Location: [facility];  Service: Endoscopy;  Laterality: N/A;  . FINE NEEDLE ASPIRATION  03/01/2011   Procedure: FINE NEEDLE ASPIRATION (FNA) LINEAR;  Surgeon: Freddy Jaksch, MD;  Location: [facility];  Service: Endoscopy;  Laterality: N/A;  . HERNIA REPAIR  AS CHILD  . JOINT REPLACEMENT    . KNEE ARTHROSCOPY  AUG 2013   RIGHT KNEE  . LAPAROSCOPIC ASSISTED VAGINAL HYSTERECTOMY  1997  . LAPAROSCOPIC CHOLECYSTECTOMY  01-09-2011  . ROBOTIC-ASSISTED RIGHT SALPINGO-OOPHORECTOMY W/ LYSIS ADHESIONS  12-21-2009   HX ENDOMETRIOSIS AND PELVIC PAIN  . TONSILLECTOMY  AS CHILD  . TOTAL KNEE ARTHROPLASTY Right 01/13/2014   Procedure: RIGHT TOTAL KNEE  ARTHROPLASTY;  Surgeon: Shelda Pal, MD;  Location: [facility];  Service: Orthopedics;  Laterality: Right;  . TOTAL KNEE REVISION Right 08/31/2014   Procedure: RIGHT TOTAL KNEE ARTHROPLASTY RESECTION;  Surgeon: Durene Romans, MD;  Location: [facility];  Service: Orthopedics;  Laterality: Right;  . URETEROSCOPIC STONE EXTRACTIONS     X3      Current Outpatient Prescriptions:  .  albuterol (PROVENTIL HFA;VENTOLIN HFA) 108 (90 BASE) MCG/ACT inhaler, Inhale 2 puffs into the lungs every 6 (six) hours as needed for wheezing., Disp: , Rfl:  .  ALPRAZolam (XANAX) 1 MG tablet, Take 1 mg by mouth 3 (three) times daily. , Disp: , Rfl:  .  amoxicillin (AMOXIL) 500 MG capsule, Take 2,000 mg by mouth once. For dental procedure, Disp: , Rfl:  .  beclomethasone (QVAR) 80 MCG/ACT inhaler, Inhale 2 puffs into the lungs 2 (two) times daily as needed (shortness of breath.). , Disp: , Rfl:  .  buPROPion (WELLBUTRIN XL) 300 MG 24 hr tablet, Take 300 mg by mouth every morning. , Disp: , Rfl:  .  docusate sodium (COLACE) 100 MG capsule, Take 1 capsule (100 mg total) by mouth 2 (two) times daily. (Patient taking differently: Take 200 mg by mouth at bedtime as needed for moderate constipation. ), Disp: 10 capsule, Rfl: 0 .  DULoxetine (CYMBALTA) 60 MG capsule, Take 60 mg by mouth 2 (two) times daily., Disp: , Rfl:  .  ergocalciferol (VITAMIN D2) 50000 units capsule, Take 50,000 Units by mouth every Tuesday., Disp: , Rfl:  .  fluticasone (FLONASE) 50 MCG/ACT nasal spray, Place 2 sprays into both nostrils daily as needed for allergies or rhinitis., Disp: , Rfl:  .  hydrochlorothiazide (HYDRODIURIL) 25 MG tablet, Take 25 mg by mouth daily with lunch. , Disp: , Rfl:  .  hydrocortisone-pramoxine (PROCTOFOAM-HC) rectal foam, Place 1 applicator rectally 2 (two) times daily as needed for hemorrhoids., Disp: , Rfl:  .  levothyroxine (SYNTHROID, LEVOTHROID) 125 MCG tablet, Take 125 mcg by mouth daily before breakfast., Disp: , Rfl:  .   Meth-Hyo-M Bl-Na Phos-Ph Sal (URIBEL) 118 MG CAPS, Take 118 mg by mouth 2 (two) times daily as needed (UTI). , Disp: , Rfl:  .  methocarbamol (ROBAXIN) 750 MG tablet, Take 750 mg by mouth 3 (three) times daily., Disp: , Rfl:  .  nitrofurantoin, macrocrystal-monohydrate, (MACROBID) 100 MG capsule, Take 100 mg by mouth daily., Disp: , Rfl:  .  omeprazole (PRILOSEC) 20 MG capsule, Take 20 mg by mouth daily with lunch. , Disp: , Rfl:  .  oxyCODONE 10 MG TABS, Take 1-3 tablets (10-30 mg total) by mouth every 4 (four) hours as needed for severe pain. (Patient taking differently: Take 10 mg by mouth 3 (three) times daily. ), Disp: 120 tablet, Rfl: 0 .  pentosan polysulfate (ELMIRON) 100 MG capsule, Take 100 mg by mouth 3 (three) times daily., Disp: , Rfl:  .  polycarbophil (FIBERCON) 625 MG tablet, Take 1,250 mg by mouth 2 (two) times daily., Disp: , Rfl:  .  polyethylene glycol (MIRALAX / GLYCOLAX) packet, Take 17 g by mouth daily as needed for moderate constipation. , Disp: , Rfl:  .  pregabalin (LYRICA) 300 MG capsule, Take 300 mg by mouth 2 (two) times daily., Disp: , Rfl:  .  promethazine (PHENERGAN) 25 MG tablet, Take 1 tablet (25 mg total) by mouth 3 (three) times daily as needed for nausea. (Patient taking differently: Take 25-50 mg by mouth every 6 (six) hours as needed for nausea. ), Disp: 30 tablet, Rfl: 0 .  sodium chloride (OCEAN) 0.65 % SOLN nasal spray, Place 1 spray into both nostrils daily as needed for congestion. After flonase., Disp: , Rfl:  .  Tamsulosin HCl (FLOMAX) 0.4 MG CAPS, Take 0.4 mg by mouth daily with lunch. , Disp: , Rfl:  .  Tapentadol HCl (NUCYNTA ER) 150 MG TB12, Take 1 tablet by mouth 2 (two) times daily., Disp: , Rfl:  .  ferrous sulfate 325 (65 FE) MG tablet, Take 1 tablet (325 mg total) by mouth 3 (three) times daily after meals. (Patient not taking: Reported on 12/07/2015), Disp: , Rfl: 3 .  methocarbamol (ROBAXIN) 500 MG tablet, Take 1 tablet (500 mg total) by mouth  every 6 (six) hours as needed for muscle spasms. (Patient not taking: Reported on 12/07/2015), Disp: 50 tablet, Rfl: 0 .  potassium citrate (UROCIT-K) 10 MEQ (1080 MG) SR tablet, Take 10 mEq by mouth at bedtime., Disp: , Rfl:     Allergies  Allergen Reactions  . Deltasone [Prednisone] Other (See Comments)    Puts her into a coma. Was admitted to ICU for it.  . Solu-Medrol [Methylprednisolone] Shortness Of Breath and Other (See Comments)    Sob, Ams  . Dilaudid [Hydromorphone Hcl] Other (See Comments)    Hallucinations  . Zolpidem Tartrate Other (See Comments)    hallucinations    Social History  Substance Use Topics  . Smoking status: Never Smoker  . Smokeless tobacco: Never Used  . Alcohol use No    Family History  Problem Relation Age of Onset  . Cancer Father   . Fibromyalgia Sister      Review of Systems  Constitutional: Positive for malaise/fatigue. Negative for chills, diaphoresis, fever and weight loss.  HENT: Negative for congestion, ear discharge, ear pain, hearing loss, nosebleeds, sore throat and tinnitus.   Eyes: Negative.   Respiratory: Negative.  Negative for stridor.   Cardiovascular: Negative.   Gastrointestinal: Positive for constipation, heartburn, nausea and vomiting. Negative for abdominal pain, blood in stool, diarrhea and melena.  Genitourinary: Positive for dysuria. Negative for flank pain, hematuria and urgency.       Positive for weak stream  Musculoskeletal: Positive for back pain, joint pain and myalgias. Negative for falls and neck pain.  Skin: Negative.   Neurological: Positive for headaches. Negative for dizziness, tingling, tremors, sensory change, speech change, focal weakness, seizures, loss of consciousness and weakness.  Endo/Heme/Allergies: Negative.   Psychiatric/Behavioral: Negative.     Objective:  Physical Exam  Constitutional: She is oriented to person, place, and time. She appears well-developed. No distress.  Morbidly obese   HENT:  Head: Normocephalic and atraumatic.  Right Ear: External ear normal.  Left Ear: External ear normal.  Nose: Nose normal.  Mouth/Throat: Oropharynx is clear and moist.  Eyes: Conjunctivae and EOM are normal.  Neck: Normal range of motion. Neck supple.  Cardiovascular: Normal  rate, regular rhythm, normal heart sounds and intact distal pulses.   No murmur heard. Respiratory: Effort normal and breath sounds normal. No respiratory distress. She has no wheezes.  GI: Soft. Bowel sounds are normal. She exhibits no distension. There is no tenderness.  Musculoskeletal:  Her hips showed normal range of motion, no discomfort. Left knee, no effusion. Range of motion is about 5 to 125. She is very tender over the medial joint line. There is no lateral tenderness or instability noted. Right knee shows a valgus alignment. She has a well healed incision. There is no warmth or effusion about the knee. Her range of motion is 0 to 115. She is not having any instability. Her gait pattern is significantly antalgic essentially involving both legs.  Neurological: She is alert and oriented to person, place, and time. She has normal strength. No sensory deficit.  Skin: No rash noted. She is not diaphoretic. No erythema.  Psychiatric: She has a normal mood and affect. Her behavior is normal.      Vitals  Weight: 225 lb Height: 65in Body Surface Area: 2.08 m Body Mass Index: 37.44 kg/m  Pulse: 84 (Regular)  BP: 110/70 (Sitting, Left Arm, Standard)   Imaging Review Plain radiographs demonstrate severe degenerative joint disease of the left knee(s). The overall alignment ismild varus. The bone quality appears to be good for age and reported activity level.  Assessment/Plan:  End stage primary osteoarthritis, left knee   The patient history, physical examination, clinical judgment of the provider and imaging studies are consistent with end stage degenerative joint disease of the left knee(s)  and total knee arthroplasty is deemed medically necessary. The treatment options including medical management, injection therapy arthroscopy and arthroplasty were discussed at length. The risks and benefits of total knee arthroplasty were presented and reviewed. The risks due to aseptic loosening, infection, stiffness, patella tracking problems, thromboembolic complications and other imponderables were discussed. The patient acknowledged the explanation, agreed to proceed with the plan and consent was signed. Patient is being admitted for inpatient treatment for surgery, pain control, PT, OT, prophylactic antibiotics, VTE prophylaxis, progressive ambulation and ADL's and discharge planning. The patient is planning to be discharged home with home health services   PCP: Dr. Selena Batten Uro: Dr. Boyd Kerbs GI: Dr. Dulce Sellar Therapy Plan: HHPT then outpatient Home with husband    Dimitri Ped, [place]

## 2015-12-13 ENCOUNTER — Encounter (HOSPITAL_COMMUNITY)
Admission: RE | Disposition: A | Discharge status: Home Health | Payer: Self-pay | Source: Ambulatory Visit | Attending: Orthopedic Surgery

## 2015-12-13 ENCOUNTER — Inpatient Hospital Stay (HOSPITAL_COMMUNITY)
Admission: RE | Admit: 2015-12-13 | Discharge: 2015-12-15 | DRG: 470 | Disposition: A | Discharge status: Home Health | Payer: BLUE CROSS/BLUE SHIELD | Source: Ambulatory Visit | Attending: Orthopedic Surgery | Admitting: Orthopedic Surgery

## 2015-12-13 ENCOUNTER — Encounter (HOSPITAL_COMMUNITY): Payer: Self-pay | Admitting: *Deleted

## 2015-12-13 ENCOUNTER — Inpatient Hospital Stay (HOSPITAL_COMMUNITY): Payer: BLUE CROSS/BLUE SHIELD | Admitting: Anesthesiology

## 2015-12-13 DIAGNOSIS — Z96651 Presence of right artificial knee joint: Secondary | ICD-10-CM | POA: Diagnosis present

## 2015-12-13 DIAGNOSIS — Z7951 Long term (current) use of inhaled steroids: Secondary | ICD-10-CM

## 2015-12-13 DIAGNOSIS — M1712 Unilateral primary osteoarthritis, left knee: Principal | ICD-10-CM | POA: Diagnosis present

## 2015-12-13 DIAGNOSIS — F418 Other specified anxiety disorders: Secondary | ICD-10-CM | POA: Diagnosis present

## 2015-12-13 DIAGNOSIS — E039 Hypothyroidism, unspecified: Secondary | ICD-10-CM | POA: Diagnosis present

## 2015-12-13 DIAGNOSIS — D649 Anemia, unspecified: Secondary | ICD-10-CM | POA: Diagnosis not present

## 2015-12-13 DIAGNOSIS — M25562 Pain in left knee: Secondary | ICD-10-CM | POA: Diagnosis not present

## 2015-12-13 DIAGNOSIS — M1710 Unilateral primary osteoarthritis, unspecified knee: Secondary | ICD-10-CM | POA: Diagnosis present

## 2015-12-13 DIAGNOSIS — Z79891 Long term (current) use of opiate analgesic: Secondary | ICD-10-CM

## 2015-12-13 DIAGNOSIS — Z87442 Personal history of urinary calculi: Secondary | ICD-10-CM

## 2015-12-13 DIAGNOSIS — Z981 Arthrodesis status: Secondary | ICD-10-CM | POA: Diagnosis not present

## 2015-12-13 DIAGNOSIS — K219 Gastro-esophageal reflux disease without esophagitis: Secondary | ICD-10-CM | POA: Diagnosis not present

## 2015-12-13 DIAGNOSIS — Z6839 Body mass index (BMI) 39.0-39.9, adult: Secondary | ICD-10-CM

## 2015-12-13 DIAGNOSIS — F419 Anxiety disorder, unspecified: Secondary | ICD-10-CM | POA: Diagnosis not present

## 2015-12-13 DIAGNOSIS — M179 Osteoarthritis of knee, unspecified: Secondary | ICD-10-CM | POA: Diagnosis present

## 2015-12-13 HISTORY — PX: TOTAL KNEE ARTHROPLASTY: SHX125

## 2015-12-13 LAB — TYPE AND SCREEN
ABO/RH(D): B POS
Antibody Screen: NEGATIVE

## 2015-12-13 SURGERY — ARTHROPLASTY, KNEE, TOTAL
Anesthesia: Spinal | Site: Knee | Laterality: Left

## 2015-12-13 MED ORDER — NITROFURANTOIN MONOHYD MACRO 100 MG PO CAPS
100.0000 mg | ORAL_CAPSULE | Freq: Every day | ORAL | Status: DC
  Administered 2015-12-14 – 2015-12-15 (×2): 100 mg via ORAL
  Filled 2015-12-13 (×2): qty 1

## 2015-12-13 MED ORDER — STERILE WATER FOR IRRIGATION IR SOLN
Status: DC | PRN
  Administered 2015-12-13: 2000 mL

## 2015-12-13 MED ORDER — ACETAMINOPHEN 500 MG PO TABS
1000.0000 mg | ORAL_TABLET | Freq: Four times a day (QID) | ORAL | Status: AC
  Administered 2015-12-13 – 2015-12-14 (×4): 1000 mg via ORAL
  Filled 2015-12-13 (×4): qty 2

## 2015-12-13 MED ORDER — CHLORHEXIDINE GLUCONATE 4 % EX LIQD: 60.0000 mL | Freq: Once | CUTANEOUS | Status: DC

## 2015-12-13 MED ORDER — FLEET ENEMA 7-19 GM/118ML RE ENEM: 1.0000 | ENEMA | Freq: Once | RECTAL | Status: DC | PRN

## 2015-12-13 MED ORDER — VANCOMYCIN HCL IN DEXTROSE 1-5 GM/200ML-% IV SOLN
1000.0000 mg | Freq: Once | INTRAVENOUS | Status: AC
Start: 2015-12-13 — End: 2015-12-13
  Administered 2015-12-13: 1000 mg via INTRAVENOUS
  Filled 2015-12-13: qty 200

## 2015-12-13 MED ORDER — TAPENTADOL HCL ER 150 MG PO TB12: 1.0000 | ORAL_TABLET | Freq: Two times a day (BID) | ORAL | Status: DC

## 2015-12-13 MED ORDER — OXYCODONE HCL 5 MG PO TABS
10.0000 mg | ORAL_TABLET | ORAL | Status: DC | PRN
  Administered 2015-12-13 (×2): 10 mg via ORAL
  Administered 2015-12-14 (×2): 20 mg via ORAL
  Administered 2015-12-14: 15 mg via ORAL
  Administered 2015-12-14 – 2015-12-15 (×5): 20 mg via ORAL
  Administered 2015-12-15: 10 mg via ORAL
  Administered 2015-12-15: 20 mg via ORAL
  Filled 2015-12-13: qty 2
  Filled 2015-12-13 (×6): qty 4
  Filled 2015-12-13: qty 2
  Filled 2015-12-13: qty 3
  Filled 2015-12-13: qty 4
  Filled 2015-12-13: qty 2
  Filled 2015-12-13: qty 4

## 2015-12-13 MED ORDER — ONDANSETRON HCL 4 MG/2ML IJ SOLN
4.0000 mg | Freq: Four times a day (QID) | INTRAMUSCULAR | Status: DC | PRN
  Administered 2015-12-14: 4 mg via INTRAVENOUS
  Filled 2015-12-13: qty 2

## 2015-12-13 MED ORDER — BUPIVACAINE LIPOSOME 1.3 % IJ SUSP
20.0000 mL | Freq: Once | INTRAMUSCULAR | Status: DC
  Filled 2015-12-13: qty 20

## 2015-12-13 MED ORDER — FLUTICASONE PROPIONATE 50 MCG/ACT NA SUSP: 2.0000 | Freq: Every day | NASAL | Status: DC | PRN

## 2015-12-13 MED ORDER — FENTANYL CITRATE (PF) 100 MCG/2ML IJ SOLN
INTRAMUSCULAR | Status: DC | PRN
  Administered 2015-12-13 (×2): 50 ug via INTRAVENOUS

## 2015-12-13 MED ORDER — METHOCARBAMOL 1000 MG/10ML IJ SOLN
500.0000 mg | Freq: Four times a day (QID) | INTRAVENOUS | Status: DC | PRN
  Administered 2015-12-13: 500 mg via INTRAVENOUS
  Filled 2015-12-13: qty 5
  Filled 2015-12-13: qty 550

## 2015-12-13 MED ORDER — LEVOTHYROXINE SODIUM 125 MCG PO TABS
125.0000 ug | ORAL_TABLET | Freq: Every day | ORAL | Status: DC
  Administered 2015-12-14 – 2015-12-15 (×2): 125 ug via ORAL
  Filled 2015-12-13 (×2): qty 1

## 2015-12-13 MED ORDER — PENTOSAN POLYSULFATE SODIUM 100 MG PO CAPS
100.0000 mg | ORAL_CAPSULE | Freq: Three times a day (TID) | ORAL | Status: DC
  Administered 2015-12-13 – 2015-12-15 (×6): 100 mg via ORAL
  Filled 2015-12-13 (×8): qty 1

## 2015-12-13 MED ORDER — PROPOFOL 500 MG/50ML IV EMUL
INTRAVENOUS | Status: DC | PRN
  Administered 2015-12-13: 100 ug/kg/min via INTRAVENOUS

## 2015-12-13 MED ORDER — TRANEXAMIC ACID 1000 MG/10ML IV SOLN
1000.0000 mg | INTRAVENOUS | Status: AC
  Administered 2015-12-13: 1000 mg via INTRAVENOUS
  Filled 2015-12-13: qty 10

## 2015-12-13 MED ORDER — MORPHINE SULFATE (PF) 2 MG/ML IV SOLN
1.0000 mg | INTRAVENOUS | Status: DC | PRN
  Administered 2015-12-13: 1 mg via INTRAVENOUS
  Filled 2015-12-13: qty 1

## 2015-12-13 MED ORDER — ALPRAZOLAM 1 MG PO TABS
1.0000 mg | ORAL_TABLET | Freq: Three times a day (TID) | ORAL | Status: DC
  Administered 2015-12-13 – 2015-12-15 (×6): 1 mg via ORAL
  Filled 2015-12-13 (×6): qty 1

## 2015-12-13 MED ORDER — SODIUM CHLORIDE 0.9 % IJ SOLN
INTRAMUSCULAR | Status: DC | PRN
  Administered 2015-12-13: 30 mL

## 2015-12-13 MED ORDER — BUPROPION HCL ER (XL) 300 MG PO TB24
300.0000 mg | ORAL_TABLET | Freq: Every day | ORAL | Status: DC
  Administered 2015-12-14 – 2015-12-15 (×2): 300 mg via ORAL
  Filled 2015-12-13 (×2): qty 1

## 2015-12-13 MED ORDER — PANTOPRAZOLE SODIUM 40 MG PO TBEC
40.0000 mg | DELAYED_RELEASE_TABLET | Freq: Every day | ORAL | Status: DC
  Administered 2015-12-14: 40 mg via ORAL
  Filled 2015-12-13: qty 1

## 2015-12-13 MED ORDER — ALBUTEROL SULFATE HFA 108 (90 BASE) MCG/ACT IN AERS: 2.0000 | INHALATION_SPRAY | Freq: Four times a day (QID) | RESPIRATORY_TRACT | Status: DC | PRN

## 2015-12-13 MED ORDER — ONDANSETRON HCL 4 MG/2ML IJ SOLN: 4.0000 mg | Freq: Once | INTRAMUSCULAR | Status: DC | PRN

## 2015-12-13 MED ORDER — MEPERIDINE HCL 50 MG/ML IJ SOLN: 6.2500 mg | INTRAMUSCULAR | Status: DC | PRN

## 2015-12-13 MED ORDER — METOCLOPRAMIDE HCL 5 MG/ML IJ SOLN: 5.0000 mg | Freq: Three times a day (TID) | INTRAMUSCULAR | Status: DC | PRN

## 2015-12-13 MED ORDER — BUPIVACAINE LIPOSOME 1.3 % IJ SUSP
INTRAMUSCULAR | Status: DC | PRN
  Administered 2015-12-13: 20 mL

## 2015-12-13 MED ORDER — PROPOFOL 10 MG/ML IV BOLUS
INTRAVENOUS | Status: AC
  Filled 2015-12-13: qty 20

## 2015-12-13 MED ORDER — PHENOL 1.4 % MT LIQD
1.0000 | OROMUCOSAL | Status: DC | PRN
Start: 2015-12-13 — End: 2015-12-15

## 2015-12-13 MED ORDER — PROMETHAZINE HCL 25 MG PO TABS
25.0000 mg | ORAL_TABLET | Freq: Four times a day (QID) | ORAL | Status: DC | PRN
  Administered 2015-12-15: 25 mg via ORAL
  Filled 2015-12-13: qty 1

## 2015-12-13 MED ORDER — CEFAZOLIN SODIUM-DEXTROSE 2-4 GM/100ML-% IV SOLN
INTRAVENOUS | Status: AC
  Filled 2015-12-13: qty 100

## 2015-12-13 MED ORDER — METHOCARBAMOL 500 MG PO TABS
500.0000 mg | ORAL_TABLET | Freq: Four times a day (QID) | ORAL | Status: DC | PRN
  Administered 2015-12-13 – 2015-12-14 (×4): 500 mg via ORAL
  Filled 2015-12-13 (×4): qty 1

## 2015-12-13 MED ORDER — ALBUTEROL SULFATE (2.5 MG/3ML) 0.083% IN NEBU
2.5000 mg | INHALATION_SOLUTION | Freq: Four times a day (QID) | RESPIRATORY_TRACT | Status: DC | PRN
  Administered 2015-12-14: 2.5 mg via RESPIRATORY_TRACT
  Filled 2015-12-13: qty 3

## 2015-12-13 MED ORDER — TAPENTADOL HCL ER 50 MG PO TB12
150.0000 mg | ORAL_TABLET | Freq: Two times a day (BID) | ORAL | Status: DC
  Filled 2015-12-13: qty 3

## 2015-12-13 MED ORDER — TRANEXAMIC ACID 1000 MG/10ML IV SOLN
1000.0000 mg | Freq: Once | INTRAVENOUS | Status: AC
  Administered 2015-12-13: 1000 mg via INTRAVENOUS
  Filled 2015-12-13: qty 10

## 2015-12-13 MED ORDER — FENTANYL CITRATE (PF) 100 MCG/2ML IJ SOLN
25.0000 ug | INTRAMUSCULAR | Status: DC | PRN
  Administered 2015-12-13 (×2): 50 ug via INTRAVENOUS

## 2015-12-13 MED ORDER — CEFAZOLIN SODIUM-DEXTROSE 2-4 GM/100ML-% IV SOLN
2.0000 g | INTRAVENOUS | Status: AC
  Administered 2015-12-13: 2 g via INTRAVENOUS

## 2015-12-13 MED ORDER — ONDANSETRON HCL 4 MG PO TABS: 4.0000 mg | ORAL_TABLET | Freq: Four times a day (QID) | ORAL | Status: DC | PRN

## 2015-12-13 MED ORDER — LACTATED RINGERS IV SOLN
INTRAVENOUS | Status: DC
  Administered 2015-12-13: 11:00:00 via INTRAVENOUS

## 2015-12-13 MED ORDER — MENTHOL 3 MG MT LOZG
1.0000 | LOZENGE | OROMUCOSAL | Status: DC | PRN
  Administered 2015-12-14: 3 mg via ORAL
  Filled 2015-12-13: qty 9

## 2015-12-13 MED ORDER — DIPHENHYDRAMINE HCL 12.5 MG/5ML PO ELIX: 12.5000 mg | ORAL_SOLUTION | ORAL | Status: DC | PRN

## 2015-12-13 MED ORDER — BISACODYL 10 MG RE SUPP: 10.0000 mg | Freq: Every day | RECTAL | Status: DC | PRN

## 2015-12-13 MED ORDER — PREGABALIN 100 MG PO CAPS
300.0000 mg | ORAL_CAPSULE | Freq: Two times a day (BID) | ORAL | Status: DC
  Administered 2015-12-13 – 2015-12-15 (×4): 300 mg via ORAL
  Filled 2015-12-13 (×4): qty 3

## 2015-12-13 MED ORDER — SODIUM CHLORIDE 0.9 % IJ SOLN
INTRAMUSCULAR | Status: AC
  Filled 2015-12-13: qty 50

## 2015-12-13 MED ORDER — SODIUM CHLORIDE 0.9 % IR SOLN
Status: DC | PRN
  Administered 2015-12-13: 1000 mL

## 2015-12-13 MED ORDER — TAPENTADOL HCL ER 100 MG PO TB12
150.0000 mg | ORAL_TABLET | Freq: Two times a day (BID) | ORAL | Status: DC
  Administered 2015-12-13: 150 mg via ORAL

## 2015-12-13 MED ORDER — METOCLOPRAMIDE HCL 5 MG PO TABS: 5.0000 mg | ORAL_TABLET | Freq: Three times a day (TID) | ORAL | Status: DC | PRN

## 2015-12-13 MED ORDER — POLYETHYLENE GLYCOL 3350 17 G PO PACK: 17.0000 g | PACK | Freq: Every day | ORAL | Status: DC | PRN

## 2015-12-13 MED ORDER — MIDAZOLAM HCL 2 MG/2ML IJ SOLN
INTRAMUSCULAR | Status: AC
  Filled 2015-12-13: qty 2

## 2015-12-13 MED ORDER — FENTANYL CITRATE (PF) 100 MCG/2ML IJ SOLN
INTRAMUSCULAR | Status: AC
  Administered 2015-12-13: 50 ug via INTRAVENOUS
  Filled 2015-12-13: qty 2

## 2015-12-13 MED ORDER — DULOXETINE HCL 60 MG PO CPEP
60.0000 mg | ORAL_CAPSULE | Freq: Two times a day (BID) | ORAL | Status: DC
  Administered 2015-12-13 – 2015-12-15 (×4): 60 mg via ORAL
  Filled 2015-12-13 (×4): qty 1

## 2015-12-13 MED ORDER — ACETAMINOPHEN 10 MG/ML IV SOLN
1000.0000 mg | Freq: Once | INTRAVENOUS | Status: AC
  Administered 2015-12-13: 1000 mg via INTRAVENOUS

## 2015-12-13 MED ORDER — SODIUM CHLORIDE 0.9 % IV SOLN
INTRAVENOUS | Status: DC
  Administered 2015-12-13 – 2015-12-14 (×2): via INTRAVENOUS

## 2015-12-13 MED ORDER — PREGABALIN 100 MG PO CAPS: 300.0000 mg | ORAL_CAPSULE | Freq: Two times a day (BID) | ORAL | Status: DC

## 2015-12-13 MED ORDER — ACETAMINOPHEN 650 MG RE SUPP: 650.0000 mg | Freq: Four times a day (QID) | RECTAL | Status: DC | PRN

## 2015-12-13 MED ORDER — TAMSULOSIN HCL 0.4 MG PO CAPS
0.4000 mg | ORAL_CAPSULE | Freq: Every day | ORAL | Status: DC
  Administered 2015-12-14: 0.4 mg via ORAL
  Filled 2015-12-13: qty 1

## 2015-12-13 MED ORDER — BUPIVACAINE HCL (PF) 0.25 % IJ SOLN
INTRAMUSCULAR | Status: AC
  Filled 2015-12-13: qty 30

## 2015-12-13 MED ORDER — ONDANSETRON HCL 4 MG/2ML IJ SOLN
INTRAMUSCULAR | Status: DC | PRN
  Administered 2015-12-13: 4 mg via INTRAVENOUS

## 2015-12-13 MED ORDER — RIVAROXABAN 10 MG PO TABS
10.0000 mg | ORAL_TABLET | Freq: Every day | ORAL | Status: DC
  Administered 2015-12-14 – 2015-12-15 (×2): 10 mg via ORAL
  Filled 2015-12-13 (×2): qty 1

## 2015-12-13 MED ORDER — PROPOFOL 10 MG/ML IV BOLUS
INTRAVENOUS | Status: AC
  Filled 2015-12-13: qty 40

## 2015-12-13 MED ORDER — 0.9 % SODIUM CHLORIDE (POUR BTL) OPTIME
TOPICAL | Status: DC | PRN
Start: 2015-12-13 — End: 2015-12-13
  Administered 2015-12-13: 1000 mL

## 2015-12-13 MED ORDER — HYDROCHLOROTHIAZIDE 25 MG PO TABS
25.0000 mg | ORAL_TABLET | Freq: Every day | ORAL | Status: DC
  Administered 2015-12-14: 25 mg via ORAL
  Filled 2015-12-13: qty 1

## 2015-12-13 MED ORDER — FENTANYL CITRATE (PF) 100 MCG/2ML IJ SOLN
INTRAMUSCULAR | Status: AC
  Filled 2015-12-13: qty 2

## 2015-12-13 MED ORDER — ACETAMINOPHEN 10 MG/ML IV SOLN
INTRAVENOUS | Status: AC
  Filled 2015-12-13: qty 100

## 2015-12-13 MED ORDER — CEFAZOLIN SODIUM-DEXTROSE 2-4 GM/100ML-% IV SOLN
2.0000 g | Freq: Four times a day (QID) | INTRAVENOUS | Status: AC
  Administered 2015-12-13 – 2015-12-14 (×2): 2 g via INTRAVENOUS
  Filled 2015-12-13 (×3): qty 100

## 2015-12-13 MED ORDER — ONDANSETRON HCL 4 MG/2ML IJ SOLN
INTRAMUSCULAR | Status: AC
  Filled 2015-12-13: qty 2

## 2015-12-13 MED ORDER — MIDAZOLAM HCL 5 MG/5ML IJ SOLN
INTRAMUSCULAR | Status: DC | PRN
  Administered 2015-12-13: 2 mg via INTRAVENOUS

## 2015-12-13 MED ORDER — MORPHINE SULFATE (PF) 2 MG/ML IV SOLN
2.0000 mg | INTRAVENOUS | Status: DC | PRN
  Administered 2015-12-13: 2 mg via INTRAVENOUS
  Administered 2015-12-13 – 2015-12-14 (×2): 4 mg via INTRAVENOUS
  Filled 2015-12-13: qty 2
  Filled 2015-12-13: qty 1
  Filled 2015-12-13: qty 2

## 2015-12-13 MED ORDER — BUPIVACAINE HCL 0.25 % IJ SOLN
INTRAMUSCULAR | Status: DC | PRN
  Administered 2015-12-13: 20 mL

## 2015-12-13 MED ORDER — ACETAMINOPHEN 325 MG PO TABS: 650.0000 mg | ORAL_TABLET | Freq: Four times a day (QID) | ORAL | Status: DC | PRN

## 2015-12-13 MED ORDER — BUPIVACAINE IN DEXTROSE 0.75-8.25 % IT SOLN
INTRATHECAL | Status: DC | PRN
  Administered 2015-12-13: 2 mL via INTRATHECAL

## 2015-12-13 MED ORDER — DOCUSATE SODIUM 100 MG PO CAPS
100.0000 mg | ORAL_CAPSULE | Freq: Two times a day (BID) | ORAL | Status: DC
  Administered 2015-12-13 – 2015-12-15 (×4): 100 mg via ORAL
  Filled 2015-12-13 (×4): qty 1

## 2015-12-13 SURGICAL SUPPLY — 49 items
BAG DECANTER FOR FLEXI CONT (MISCELLANEOUS) ×3 IMPLANT
BAG SPEC THK2 15X12 ZIP CLS (MISCELLANEOUS) ×1
BAG ZIPLOCK 12X15 (MISCELLANEOUS) ×3 IMPLANT
BANDAGE ACE 6X5 VEL STRL LF (GAUZE/BANDAGES/DRESSINGS) ×3 IMPLANT
BLADE SAG 18X100X1.27 (BLADE) ×3 IMPLANT
BLADE SAW SGTL 11.0X1.19X90.0M (BLADE) ×3 IMPLANT
BOWL SMART MIX CTS (DISPOSABLE) ×3 IMPLANT
CAPT KNEE TOTAL 3 ATTUNE ×2 IMPLANT
CEMENT HV SMART SET (Cement) ×6 IMPLANT
CLOSURE WOUND 1/2 X4 (GAUZE/BANDAGES/DRESSINGS) ×2
CLOTH BEACON ORANGE TIMEOUT ST (SAFETY) ×3 IMPLANT
CUFF TOURN SGL QUICK 34 (TOURNIQUET CUFF) ×3
CUFF TRNQT CYL 34X4X40X1 (TOURNIQUET CUFF) ×1 IMPLANT
DECANTER SPIKE VIAL GLASS SM (MISCELLANEOUS) ×3 IMPLANT
DRAPE U-SHAPE 47X51 STRL (DRAPES) ×3 IMPLANT
DRSG ADAPTIC 3X8 NADH LF (GAUZE/BANDAGES/DRESSINGS) ×3 IMPLANT
DURAPREP 26ML APPLICATOR (WOUND CARE) ×3 IMPLANT
ELECT REM PT RETURN 9FT ADLT (ELECTROSURGICAL) ×3
ELECTRODE REM PT RTRN 9FT ADLT (ELECTROSURGICAL) ×1 IMPLANT
EVACUATOR 1/8 PVC DRAIN (DRAIN) ×3 IMPLANT
GAUZE SPONGE 4X4 12PLY STRL (GAUZE/BANDAGES/DRESSINGS) ×3 IMPLANT
GLOVE BIO SURGEON STRL SZ7.5 (GLOVE) ×6 IMPLANT
GLOVE BIO SURGEON STRL SZ8 (GLOVE) ×5 IMPLANT
GLOVE BIOGEL PI IND STRL 7.5 (GLOVE) IMPLANT
GLOVE BIOGEL PI IND STRL 8 (GLOVE) ×1 IMPLANT
GLOVE BIOGEL PI INDICATOR 7.5 (GLOVE) ×8
GLOVE BIOGEL PI INDICATOR 8 (GLOVE) ×2
GLOVE SURG SS PI 7.5 STRL IVOR (GLOVE) ×2 IMPLANT
GOWN SPEC L3 XXLG W/TWL (GOWN DISPOSABLE) ×2 IMPLANT
GOWN STRL REUS W/TWL LRG LVL3 (GOWN DISPOSABLE) ×3 IMPLANT
GOWN STRL REUS W/TWL XL LVL3 (GOWN DISPOSABLE) ×4 IMPLANT
HANDPIECE INTERPULSE COAX TIP (DISPOSABLE) ×3
IMMOBILIZER KNEE 20 (SOFTGOODS) ×3
IMMOBILIZER KNEE 20 THIGH 36 (SOFTGOODS) ×1 IMPLANT
MANIFOLD NEPTUNE II (INSTRUMENTS) ×3 IMPLANT
PACK TOTAL KNEE CUSTOM (KITS) ×3 IMPLANT
PAD ABD 8X10 STRL (GAUZE/BANDAGES/DRESSINGS) ×2 IMPLANT
PADDING CAST COTTON 6X4 STRL (CAST SUPPLIES) ×9 IMPLANT
POSITIONER SURGICAL ARM (MISCELLANEOUS) ×3 IMPLANT
SET HNDPC FAN SPRY TIP SCT (DISPOSABLE) ×1 IMPLANT
STRIP CLOSURE SKIN 1/2X4 (GAUZE/BANDAGES/DRESSINGS) ×4 IMPLANT
SUT MNCRL AB 4-0 PS2 18 (SUTURE) ×3 IMPLANT
SUT VIC AB 2-0 CT1 27 (SUTURE) ×9
SUT VIC AB 2-0 CT1 TAPERPNT 27 (SUTURE) ×3 IMPLANT
SUT VLOC 180 0 24IN GS25 (SUTURE) ×3 IMPLANT
SYR 50ML LL SCALE MARK (SYRINGE) ×3 IMPLANT
TRAY FOLEY CATH SILVER 14FR (SET/KITS/TRAYS/PACK) ×2 IMPLANT
WRAP KNEE MAXI GEL POST OP (GAUZE/BANDAGES/DRESSINGS) ×3 IMPLANT
YANKAUER SUCT BULB TIP 10FT TU (MISCELLANEOUS) ×3 IMPLANT

## 2015-12-13 NOTE — Anesthesia Procedure Notes (Signed)
Spinal  Patient location during procedure: OR Start time: 12/13/2015 12:41 PM End time: 12/13/2015 12:44 PM Reason for block: at surgeon's request Staffing Anesthesiologist: Lillia Abed Resident/CRNA: Chrystine Oiler G Performed: resident/CRNA  Preanesthetic Checklist Completed: patient identified, site marked, surgical consent, pre-op evaluation, timeout performed, IV checked, risks and benefits discussed and monitors and equipment checked Spinal Block Patient position: sitting Prep: Betasept Patient monitoring: heart rate, continuous pulse ox and blood pressure Approach: midline Location: L2-3 Injection technique: single-shot Needle Needle type: Pencan  Needle gauge: 24 G Needle length: 10 cm Needle insertion depth: 8 cm Assessment Sensory level: T6 Additional Notes  Kit expiration checked, sitting position, sterile prep and drape, 1% xylo local, 24g pencan x1 stick _0 -3, +CSF pre and post injection, no heme, no paraesthesia, patient tolerated well.

## 2015-12-13 NOTE — Interval H&P Note (Signed)
History and Physical Interval Note:  12/13/2015 12:29 PM  [name]  has presented today for surgery, with the diagnosis of left knee osteoarthritis  The various methods of treatment have been discussed with the patient and family. After consideration of risks, benefits and other options for treatment, the patient has consented to  Procedure(s): LEFT TOTAL KNEE ARTHROPLASTY (Left) as a surgical intervention .  The patient's history has been reviewed, patient examined, no change in status, stable for surgery.  I have reviewed the patient's chart and labs.  Questions were answered to the patient's satisfaction.     Loanne DrillingALUISIO,[name] V

## 2015-12-13 NOTE — Op Note (Signed)
OPERATIVE REPORT-TOTAL KNEE ARTHROPLASTY   Pre-operative diagnosis- Osteoarthritis  Left knee(s)  Post-operative diagnosis- Osteoarthritis Left knee(s)  Procedure-  Left  Total Knee Arthroplasty  Surgeon- Mary RankinFrank V. Bowman Higbie, MD  Assistant- Avel Peacerew Perkins, PA-C   Anesthesia-  Spinal  EBL-* No blood loss amount entered *   Drains Hemovac  Tourniquet time- 31 minutes @ 300 mm hg   Complications- None  Condition-PACU - hemodynamically stable.   Brief Clinical Note  [name] is a 50 y.o. year old female with end stage OA of her left knee with progressively worsening pain and dysfunction. She has constant pain, with activity and at rest and significant functional deficits with difficulties even with ADLs. She has had extensive non-op management including analgesics, injections of cortisone and viscosupplements, and home exercise program, but remains in significant pain with significant dysfunction. Radiographs show bone on bone arthritis medial and patellofemoral. She presents now for left Total Knee Arthroplasty.    Procedure in detail---   The patient is brought into the operating room and positioned supine on the operating table. After successful administration of  Spinal,   a tourniquet is placed high on the  Left thigh(s) and the lower extremity is prepped and draped in the usual sterile fashion. Time out is performed by the operating team and then the  Left lower extremity is wrapped in Esmarch, knee flexed and the tourniquet inflated to 300 mmHg.       A midline incision is made with a ten blade through the subcutaneous tissue to the level of the extensor mechanism. A fresh blade is used to make a medial parapatellar arthrotomy. Soft tissue over the proximal medial tibia is subperiosteally elevated to the joint line with a knife and into the semimembranosus bursa with a Cobb elevator. Soft tissue over the proximal lateral tibia is elevated with attention being paid to avoiding  the patellar tendon on the tibial tubercle. The patella is everted, knee flexed 90 degrees and the ACL and PCL are removed. Findings are bone on bone medial and patellofemoral with exposed bone lateral tibia and large marginal osteophytes.        The drill is used to create a starting hole in the distal femur and the canal is thoroughly irrigated with sterile saline to remove the fatty contents. The 5 degree Left  valgus alignment guide is placed into the femoral canal and the distal femoral cutting block is pinned to remove 9 mm off the distal femur. Resection is made with an oscillating saw.      The tibia is subluxed forward and the menisci are removed. The extramedullary alignment guide is placed referencing proximally at the medial aspect of the tibial tubercle and distally along the second metatarsal axis and tibial crest. The block is pinned to remove 2mm off the more deficient medial  side. Resection is made with an oscillating saw. Size 5is the most appropriate size for the tibia and the proximal tibia is prepared with the modular drill and keel punch for that size.      The femoral sizing guide is placed and size 5 is most appropriate. Rotation is marked off the epicondylar axis and confirmed by creating a rectangular flexion gap at 90 degrees. The size 5 cutting block is pinned in this rotation and the anterior, posterior and chamfer cuts are made with the oscillating saw. The intercondylar block is then placed and that cut is made.      Trial size 5 tibial component, trial  size 5 posterior stabilized femur and a 8  mm posterior stabilized rotating platform insert trial is placed. Full extension is achieved with excellent varus/valgus and anterior/posterior balance throughout full range of motion. The patella is everted and thickness measured to be 22  mm. Free hand resection is taken to 12 mm, a 35 template is placed, lug holes are drilled, trial patella is placed, and it tracks normally. Osteophytes  are removed off the posterior femur with the trial in place. All trials are removed and the cut bone surfaces prepared with pulsatile lavage. Cement is mixed and once ready for implantation, the size 5 tibial implant, size  5 posterior stabilized femoral component, and the size 35 patella are cemented in place and the patella is held with the clamp. The trial insert is placed and the knee held in full extension. The Exparel (20 ml mixed with 30 ml saline) and .25% Bupivicaine, are injected into the extensor mechanism, posterior capsule, medial and lateral gutters and subcutaneous tissues.  All extruded cement is removed and once the cement is hard the permanent 8 mm posterior stabilized rotating platform insert is placed into the tibial tray.      The wound is copiously irrigated with saline solution and the extensor mechanism closed over a hemovac drain with #1 V-loc suture. The tourniquet is released for a total tourniquet time of 31  minutes. Flexion against gravity is 140 degrees and the patella tracks normally. Subcutaneous tissue is closed with 2.0 vicryl and subcuticular with running 4.0 Monocryl. The incision is cleaned and dried and steri-strips and a bulky sterile dressing are applied. The limb is placed into a knee immobilizer and the patient is awakened and transported to recovery in stable condition.      Please note that a surgical assistant was a medical necessity for this procedure in order to perform it in a safe and expeditious manner. Surgical assistant was necessary to retract the ligaments and vital neurovascular structures to prevent injury to them and also necessary for proper positioning of the limb to allow for anatomic placement of the prosthesis.   [name] Mary Garno, MD    12/13/2015, 1:37 PM

## 2015-12-13 NOTE — Anesthesia Preprocedure Evaluation (Addendum)
Anesthesia Evaluation  Patient identified by MRN, date of birth, ID band Patient awake    Reviewed: Allergy & Precautions, NPO status , Patient's Chart, lab work & pertinent test results  History of Anesthesia Complications (+) PONV  Airway Mallampati: I  TM Distance: >3 FB Neck ROM: Full    Dental   Pulmonary    Pulmonary exam normal        Cardiovascular Normal cardiovascular exam     Neuro/Psych Anxiety Depression    GI/Hepatic GERD  Medicated and Controlled,  Endo/Other    Renal/GU      Musculoskeletal   Abdominal   Peds  Hematology   Anesthesia Other Findings   Reproductive/Obstetrics                             Anesthesia Physical Anesthesia Plan  ASA: III  Anesthesia Plan: Spinal   Post-op Pain Management:    Induction: Intravenous  Airway Management Planned: Simple Face Mask  Additional Equipment:   Intra-op Plan:   Post-operative Plan:   Informed Consent: I have reviewed the patients History and Physical, chart, labs and discussed the procedure including the risks, benefits and alternatives for the proposed anesthesia with the patient or authorized representative who has indicated his/her understanding and acceptance.     Plan Discussed with: CRNA and Surgeon  Anesthesia Plan Comments:         Anesthesia Quick Evaluation

## 2015-12-13 NOTE — Transfer of Care (Signed)
Immediate Anesthesia Transfer of Care Note  [name]  Procedure(s) Performed: Procedure(s): LEFT TOTAL KNEE ARTHROPLASTY (Left)  Patient Location: PACU  Anesthesia Type:MAC and Spinal  Level of Consciousness: awake, alert  and oriented  Airway & Oxygen Therapy: Patient Spontanous Breathing and Patient connected to face mask oxygen  Post-op Assessment: Report given to RN and Post -op Vital signs reviewed and stable  Post vital signs: Reviewed and stable  Last Vitals:  Vitals:   12/13/15 0943 12/13/15 0944  BP:  102/61  Pulse: 79   Resp: 16   Temp: 36.6 C     Last Pain:  Vitals:   12/13/15 1129  TempSrc:   PainSc: 3       Patients Stated Pain Goal: 3 (12/13/15 1129)  Complications: No apparent anesthesia complications

## 2015-12-13 NOTE — Anesthesia Postprocedure Evaluation (Signed)
Anesthesia Post Note  Patient: [name]  Procedure(s) Performed: Procedure(s) (LRB): LEFT TOTAL KNEE ARTHROPLASTY (Left)  Patient location during evaluation: PACU Anesthesia Type: Spinal Level of consciousness: oriented and awake and alert Pain management: pain level controlled Vital Signs Assessment: post-procedure vital signs reviewed and stable Respiratory status: spontaneous breathing, respiratory function stable and patient connected to nasal cannula oxygen Cardiovascular status: blood pressure returned to baseline and stable Postop Assessment: no headache and no backache Anesthetic complications: no    Last Vitals:  Vitals:   12/13/15 0944 12/13/15 1415  BP: 102/61   Pulse:    Resp:    Temp:  (P) 36.5 C    Last Pain:  Vitals:   12/13/15 1415  TempSrc:   PainSc: (P) 0-No pain                 Lachell Rochette DAVID

## 2015-12-14 LAB — BASIC METABOLIC PANEL
Anion gap: 7 (ref 5–15)
BUN: 12 mg/dL (ref 6–20)
CALCIUM: 8.5 mg/dL — AB (ref 8.9–10.3)
CHLORIDE: 103 mmol/L (ref 101–111)
CO2: 31 mmol/L (ref 22–32)
CREATININE: 0.85 mg/dL (ref 0.44–1.00)
Glucose, Bld: 104 mg/dL — ABNORMAL HIGH (ref 65–99)
Potassium: 4.5 mmol/L (ref 3.5–5.1)
SODIUM: 141 mmol/L (ref 135–145)

## 2015-12-14 LAB — CBC
HCT: 35.6 % — ABNORMAL LOW (ref 36.0–46.0)
HEMOGLOBIN: 11.2 g/dL — AB (ref 12.0–15.0)
MCH: 26.6 pg (ref 26.0–34.0)
MCHC: 31.5 g/dL (ref 30.0–36.0)
MCV: 84.6 fL (ref 78.0–100.0)
PLATELETS: 247 10*3/uL (ref 150–400)
RBC: 4.21 MIL/uL (ref 3.87–5.11)
RDW: 14.8 % (ref 11.5–15.5)
WBC: 8.9 10*3/uL (ref 4.0–10.5)

## 2015-12-14 MED ORDER — NON FORMULARY: 20.0000 mg | Freq: Every day | Status: DC

## 2015-12-14 MED ORDER — OXYCODONE HCL 10 MG PO TABS: 10.0000 mg | ORAL_TABLET | ORAL | 0 refills | Status: DC | PRN

## 2015-12-14 MED ORDER — RIVAROXABAN 10 MG PO TABS: 10.0000 mg | ORAL_TABLET | Freq: Every day | ORAL | 0 refills | Status: DC

## 2015-12-14 MED ORDER — TAPENTADOL HCL ER 50 MG PO TB12
150.0000 mg | ORAL_TABLET | Freq: Two times a day (BID) | ORAL | Status: DC
  Administered 2015-12-14 – 2015-12-15 (×3): 150 mg via ORAL
  Filled 2015-12-14 (×3): qty 3

## 2015-12-14 MED ORDER — OMEPRAZOLE 20 MG PO CPDR
20.0000 mg | DELAYED_RELEASE_CAPSULE | Freq: Every day | ORAL | Status: DC
  Administered 2015-12-14 – 2015-12-15 (×2): 20 mg via ORAL
  Filled 2015-12-14: qty 1

## 2015-12-14 MED ORDER — MORPHINE SULFATE (PF) 2 MG/ML IV SOLN
2.0000 mg | INTRAVENOUS | Status: DC | PRN
  Administered 2015-12-15: 2 mg via INTRAVENOUS
  Filled 2015-12-14: qty 1

## 2015-12-14 MED ORDER — METHOCARBAMOL 500 MG PO TABS: 500.0000 mg | ORAL_TABLET | Freq: Four times a day (QID) | ORAL | 0 refills | Status: DC | PRN

## 2015-12-14 NOTE — Evaluation (Signed)
Physical Therapy Evaluation Patient Details Name: [name] MRN: [id number] DOB: 11/09/1965 Today's Date: 12/14/2015   History of Present Illness  Pt is a 50 y/o female s/p L TKA  Clinical Impression  Pt is s/p L TKA resulting in deficits listed below (see PT Problem List). Pt will benefit from skilled PT to increase their independence and safety with mobility to allow discharge. Pt had difficulty maintaining awake/alert status throughout visit, was in and out of sleep. Pt performed transfer from bed to chair only for safety.  Follow Up Recommendations Home health PT;Supervision for mobility/OOB    Equipment Recommendations  None recommended by PT (pt reports having RW; poor historian this AM, will confirm)    Recommendations for Other Services       Precautions / Restrictions Precautions Precautions: Fall;Knee Required Braces or Orthoses: Knee Immobilizer - Left Knee Immobilizer - Left: Discontinue once straight leg raise with < 10 degree lag Restrictions Weight Bearing Restrictions: No LLE Weight Bearing: Weight bearing as tolerated      Mobility  Bed Mobility Overal bed mobility: Needs Assistance Bed Mobility: Supine to Sit     Supine to sit: Min assist     General bed mobility comments: assistance for L LE; verbal cues for slow controlled movement  Transfers Overall transfer level: Needs assistance Equipment used: Rolling walker (2 wheeled) Transfers: Pharmacologisttand Pivot Transfers;Sit to/from Stand Sit to Stand: Mod assist Stand pivot transfers: Mod assist       General transfer comment: assistance for rise and controlled descent, RW positioning, and steadying in standing position; verbal cues for UE, LE and RW positioning  Ambulation/Gait                Stairs            Wheelchair Mobility    Modified Rankin (Stroke Patients Only)       Balance Overall balance assessment: Needs assistance         Standing balance support: Bilateral  upper extremity supported;During functional activity (stand pivot transfer) Standing balance-Leahy Scale: Poor Standing balance comment: required B UE support using RW to maintain balance during stand pivot transfer; pt reports that she occasionally has LOB episodes at home but no falls                             Pertinent Vitals/Pain Pain Assessment: 0-10 Pain Score: 8  Pain Location: L knee Pain Descriptors / Indicators: Aching;Discomfort;Sore Pain Intervention(s): Limited activity within patient's tolerance;Monitored during session;Premedicated before session;Repositioned;Ice applied    Home Living Family/patient expects to be discharged to:: Private residence Living Arrangements: Spouse/significant other Available Help at Discharge: Family;Available PRN/intermittently Type of Home: House Home Access: Stairs to enter   Entrance Stairs-Number of Steps: 4 Home Layout: One level Home Equipment: Walker - 2 wheels;Cane - single point;Tub bench Additional Comments: pt drowsy this AM, poor historian; will confirm at next visit    Prior Function Level of Independence: Independent               Hand Dominance        Extremity/Trunk Assessment               Lower Extremity Assessment: LLE deficits/detail   LLE Deficits / Details: anticipated post-surgery weakness; required knee immobilizer, unable to perform SLR; ROM TBA     Communication   Communication: No difficulties  Cognition Arousal/Alertness: Lethargic;Suspect due to medications Behavior [facility]  tasks assessed/performed ("drowsy" but willing to participate) Overall Cognitive Status: Within Functional Limits for tasks assessed (drowsy but willing to participate)                      General Comments      Exercises     Assessment/Plan    PT Assessment Patient needs continued PT services  PT Problem List Decreased strength;Decreased range of motion;Decreased  balance;Decreased knowledge of use of DME;Pain;Decreased mobility          PT Treatment Interventions DME instruction;Gait training;Stair training;Functional mobility training;Therapeutic activities;Therapeutic exercise;Balance training;Patient/family education    PT Goals (Current goals can be found in the Care Plan section)  Acute Rehab PT Goals PT Goal Formulation: With patient Time For Goal Achievement: 12/21/15 Potential to Achieve Goals: Good    Frequency 7X/week   Barriers to discharge        Co-evaluation               End of Session Equipment Utilized During Treatment: Gait belt;Left knee immobilizer Activity Tolerance: Patient limited by lethargy Patient left: in chair;with call bell/phone within reach;with chair alarm set           Time: 1610-9604 PT Time Calculation (min) (ACUTE ONLY): 29 min   Charges:   PT Evaluation $PT Eval Low Complexity: 1 Procedure PT Treatments $Therapeutic Activity: 8-22 mins   PT G CodesKaralee Height 07-Jan-2016, 12:46 PM Karalee Height, SPT

## 2015-12-14 NOTE — Progress Notes (Signed)
[id number]/Rhonda Davis,BSN,RN3,CCM:[phone]/has needed equipment s/p tka at home/would like to use [facility] for pt/both [facility] and [facility] providers made ware of this.

## 2015-12-14 NOTE — Discharge Summary (Signed)
Physician Discharge Summary   Patient ID: [name] MRN: [id number] DOB/AGE: [birth year] 50 y.o.  Admit date: 12/13/2015 Discharge date: 12/15/2015   Primary Diagnosis:  Osteoarthritis  Left knee(s)  Admission Diagnoses:  Past Medical History:  Diagnosis Date  . Anemia   . Anxiety   . Bronchitis    hx of   . Cervical pain (neck)   . Chronic pancreatitis ([place])   . Constipation   . DDD (degenerative disc disease), cervical   . Depression   . GERD (gastroesophageal reflux disease)   . History of kidney stones   . History of pancreatitis 01-04-2011  . Hypothyroidism   . IBS (irritable bowel syndrome)   . Nausea DUE TO PAIN FROM KIDNEY STONE  . Pancreatic pseudocyst   . Pneumonia    hx of pneumonia   . PONV (postoperative nausea and vomiting)   . Right flank pain   . Right knee pain   . Right ureteral stone   . Swelling of right knee joint   . Urinary tract infection    hx of    Discharge Diagnoses:   Active Problems:   OA (osteoarthritis) of knee  Estimated body mass index is 39.88 kg/m as calculated from the following:   Height as of this encounter: 5' 4.5" (1.638 m).   Weight as of this encounter: 107 kg (236 lb).  Procedure:  Procedure(s) (LRB): LEFT TOTAL KNEE ARTHROPLASTY (Left)   Consults: None  HPI: [name] is a 50 y.o. year old female with end stage OA of her left knee with progressively worsening pain and dysfunction. She has constant pain, with activity and at rest and significant functional deficits with difficulties even with ADLs. She has had extensive non-op management including analgesics, injections of cortisone and viscosupplements, and home exercise program, but remains in significant pain with significant dysfunction. Radiographs show bone on bone arthritis medial and patellofemoral. She presents now for left Total Knee Arthroplasty.  Laboratory Data: Admission on 12/13/2015  Component Date Value Ref Range Status  . WBC 12/14/2015  8.9  4.0 - 10.5 K/uL Final  . RBC 12/14/2015 4.21  3.87 - 5.11 MIL/uL Final  . Hemoglobin 12/14/2015 11.2* 12.0 - 15.0 g/dL Final  . HCT 12/14/2015 35.6* 36.0 - 46.0 % Final  . MCV 12/14/2015 84.6  78.0 - 100.0 fL Final  . MCH 12/14/2015 26.6  26.0 - 34.0 pg Final  . MCHC 12/14/2015 31.5  30.0 - 36.0 g/dL Final  . RDW 12/14/2015 14.8  11.5 - 15.5 % Final  . Platelets 12/14/2015 247  150 - 400 K/uL Final  . Sodium 12/14/2015 141  135 - 145 mmol/L Final  . Potassium 12/14/2015 4.5  3.5 - 5.1 mmol/L Final  . Chloride 12/14/2015 103  101 - 111 mmol/L Final  . CO2 12/14/2015 31  22 - 32 mmol/L Final  . Glucose, Bld 12/14/2015 104* 65 - 99 mg/dL Final  . BUN 12/14/2015 12  6 - 20 mg/dL Final  . Creatinine, Ser 12/14/2015 0.85  0.44 - 1.00 mg/dL Final  . Calcium 12/14/2015 8.5* 8.9 - 10.3 mg/dL Final  . GFR calc non Af Amer 12/14/2015 >60  >60 mL/min Final  . GFR calc Af Amer 12/14/2015 >60  >60 mL/min Final   Comment: (NOTE) The eGFR has been calculated using the CKD EPI equation. This calculation has not been validated in all clinical situations. eGFR's persistently <60 mL/min signify possible Chronic Kidney Disease.   . Anion gap 12/14/2015 7  5 - 15 Final  Hospital Outpatient Visit on 12/07/2015  Component Date Value Ref Range Status  . aPTT 12/07/2015 38* 24 - 36 seconds Final   Comment:        IF BASELINE aPTT IS ELEVATED, SUGGEST PATIENT RISK ASSESSMENT BE USED TO DETERMINE APPROPRIATE ANTICOAGULANT THERAPY.   . WBC 12/07/2015 5.4  4.0 - 10.5 K/uL Final  . RBC 12/07/2015 4.65  3.87 - 5.11 MIL/uL Final  . Hemoglobin 12/07/2015 12.1  12.0 - 15.0 g/dL Final  . HCT 12/07/2015 37.5  36.0 - 46.0 % Final  . MCV 12/07/2015 80.6  78.0 - 100.0 fL Final  . MCH 12/07/2015 26.0  26.0 - 34.0 pg Final  . MCHC 12/07/2015 32.3  30.0 - 36.0 g/dL Final  . RDW 12/07/2015 14.3  11.5 - 15.5 % Final  . Platelets 12/07/2015 274  150 - 400 K/uL Final  . Sodium 12/07/2015 140  135 - 145 mmol/L  Final  . Potassium 12/07/2015 3.7  3.5 - 5.1 mmol/L Final  . Chloride 12/07/2015 103  101 - 111 mmol/L Final  . CO2 12/07/2015 30  22 - 32 mmol/L Final  . Glucose, Bld 12/07/2015 116* 65 - 99 mg/dL Final  . BUN 12/07/2015 18  6 - 20 mg/dL Final  . Creatinine, Ser 12/07/2015 0.90  0.44 - 1.00 mg/dL Final  . Calcium 12/07/2015 9.0  8.9 - 10.3 mg/dL Final  . Total Protein 12/07/2015 6.8  6.5 - 8.1 g/dL Final  . Albumin 12/07/2015 3.9  3.5 - 5.0 g/dL Final  . AST 12/07/2015 19  15 - 41 U/L Final  . ALT 12/07/2015 20  14 - 54 U/L Final  . Alkaline Phosphatase 12/07/2015 77  38 - 126 U/L Final  . Total Bilirubin 12/07/2015 0.5  0.3 - 1.2 mg/dL Final  . GFR calc non Af Amer 12/07/2015 >60  >60 mL/min Final  . GFR calc Af Amer 12/07/2015 >60  >60 mL/min Final   Comment: (NOTE) The eGFR has been calculated using the CKD EPI equation. This calculation has not been validated in all clinical situations. eGFR's persistently <60 mL/min signify possible Chronic Kidney Disease.   . Anion gap 12/07/2015 7  5 - 15 Final  . Prothrombin Time 12/07/2015 13.1  11.4 - 15.2 seconds Final  . INR 12/07/2015 0.99   Final  . ABO/RH(D) 12/13/2015 B POS   Final  . Antibody Screen 12/13/2015 NEG   Final  . Sample Expiration 12/13/2015 12/16/2015   Final  . Extend sample reason 12/13/2015 NO TRANSFUSIONS OR PREGNANCY IN THE PAST 3 MONTHS   Final  . Color, Urine 12/07/2015 GREEN* YELLOW Final  . APPearance 12/07/2015 CLEAR  CLEAR Final  . Specific Gravity, Urine 12/07/2015 1.028  1.005 - 1.030 Final  . pH 12/07/2015 6.0  5.0 - 8.0 Final  . Glucose, UA 12/07/2015 NEGATIVE  NEGATIVE mg/dL Final  . Hgb urine dipstick 12/07/2015 NEGATIVE  NEGATIVE Final  . Bilirubin Urine 12/07/2015 NEGATIVE  NEGATIVE Final  . Ketones, ur 12/07/2015 NEGATIVE  NEGATIVE mg/dL Final  . Protein, ur 12/07/2015 NEGATIVE  NEGATIVE mg/dL Final  . Nitrite 12/07/2015 NEGATIVE  NEGATIVE Final  . Leukocytes, UA 12/07/2015 NEGATIVE  NEGATIVE  Final  . MRSA, PCR 12/07/2015 POSITIVE* NEGATIVE Final   Comment: RESULT CALLED TO, READ BACK BY AND VERIFIED WITH: STANLEY,K @ [id number] XN [id number] BY POTEAT,S   . Staphylococcus aureus 12/07/2015 POSITIVE* NEGATIVE Final   Comment:        The Xpert  SA Assay (FDA approved for NASAL specimens in patients over 61 years of age), is one component of a comprehensive surveillance program.  Test performance has been validated by [facility] for patients greater than or equal to 68 year old. It is not intended to diagnose infection nor to guide or monitor treatment.      X-Rays:No results found.  EKG: Orders placed or performed during the hospital encounter of 10/29/12  . ED EKG   (only if pt is 50 y.o. or older and pain is above umbilicus)  . ED EKG   (only if pt is 50 y.o. or older and pain is above umbilicus)  . EKG     Hospital Course: [name] is a 50 y.o. who was admitted to [facility]. They were brought to the operating room on 12/13/2015 and underwent Procedure(s): LEFT TOTAL KNEE ARTHROPLASTY.  Patient tolerated the procedure well and was later transferred to the recovery room and then to the orthopaedic floor for postoperative care.  They were given PO and IV analgesics for pain control following their surgery.  They were given 24 hours of postoperative antibiotics of  Anti-infectives    Start     Dose/Rate Route Frequency Ordered Stop   12/13/15 1900  ceFAZolin (ANCEF) IVPB 2g/100 mL premix     2 g 200 mL/hr over 30 Minutes Intravenous Every 6 hours 12/13/15 1657 12/14/15 0148   12/13/15 1000  vancomycin (VANCOCIN) IVPB 1000 mg/200 mL premix     1,000 mg 200 mL/hr over 60 Minutes Intravenous  Once 12/13/15 0948 12/13/15 1215   12/13/15 0948  ceFAZolin (ANCEF) IVPB 2g/100 mL premix     2 g 200 mL/hr over 30 Minutes Intravenous On call to O.R. 12/13/15 9485 12/13/15 1246     and started on DVT prophylaxis in the form of Xarelto.   PT and OT were ordered for  total joint protocol.  Discharge planning consulted to help with postop disposition and equipment needs.  Patient had a decent night on the evening of surgery.  They started to get up OOB with therapy on day one. Hemovac drain was pulled without difficulty.  Continued to work with therapy into day two.  Dressing was changed on day two and the incision was healing well.  Patient was seen in rounds on day two and was ready to go home.  Discharge home with home health Diet - Regular diet Follow up - in 2 weeks Activity - WBAT Disposition - Home Condition Upon Discharge - Stable D/C Meds - See DC Summary DVT Prophylaxis - Xarelto  Discharge Instructions    Call MD / Call 911    Complete by:  As directed    If you experience chest pain or shortness of breath, CALL 911 and be transported to the hospital emergency room.  If you develope a fever above 101 F, pus (white drainage) or increased drainage or redness at the wound, or calf pain, call your surgeon's office.   Change dressing    Complete by:  As directed    Change dressing daily with sterile 4 x 4 inch gauze dressing and apply TED hose. Do not submerge the incision under water.   Constipation Prevention    Complete by:  As directed    Drink plenty of fluids.  Prune juice may be helpful.  You may use a stool softener, such as Colace (over the counter) 100 mg twice a day.  Use MiraLax (over the counter) for  constipation as needed.   Diet - low sodium heart healthy    Complete by:  As directed    Discharge instructions    Complete by:  As directed    Pick up stool softner and laxative for home use following surgery while on pain medications. Do not submerge incision under water. Please use good hand washing techniques while changing dressing each day. May shower starting three days after surgery. Please use a clean towel to pat the incision dry following showers. Continue to use ice for pain and swelling after surgery. Do not use any  lotions or creams on the incision until instructed by your surgeon.   Postoperative Constipation Protocol  Constipation - defined medically as fewer than three stools per week and severe constipation as less than one stool per week.  One of the most common issues patients have following surgery is constipation.  Even if you have a regular bowel pattern at home, your normal regimen is likely to be disrupted due to multiple reasons following surgery.  Combination of anesthesia, postoperative narcotics, change in appetite and fluid intake all can affect your bowels.  In order to avoid complications following surgery, here are some recommendations in order to help you during your recovery period.  Colace (docusate) - Pick up an over-the-counter form of Colace or another stool softener and take twice a day as long as you are requiring postoperative pain medications.  Take with a full glass of water daily.  If you experience loose stools or diarrhea, hold the colace until you stool forms back up.  If your symptoms do not get better within 1 week or if they get worse, check with your doctor.  Dulcolax (bisacodyl) - Pick up over-the-counter and take as directed by the product packaging as needed to assist with the movement of your bowels.  Take with a full glass of water.  Use this product as needed if not relieved by Colace only.   MiraLax (polyethylene glycol) - Pick up over-the-counter to have on hand.  MiraLax is a solution that will increase the amount of water in your bowels to assist with bowel movements.  Take as directed and can mix with a glass of water, juice, soda, coffee, or tea.  Take if you go more than two days without a movement. Do not use MiraLax more than once per day. Call your doctor if you are still constipated or irregular after using this medication for 7 days in a row.  If you continue to have problems with postoperative constipation, please contact the office for further assistance  and recommendations.  If you experience "the worst abdominal pain ever" or develop nausea or vomiting, please contact the office immediatly for further recommendations for treatment.   Take Xarelto for two and a half more weeks, then discontinue Xarelto. Once the patient has completed the blood thinner regimen, then take a Baby 81 mg Aspirin daily for three more weeks.   Do not put a pillow under the knee. Place it under the heel.    Complete by:  As directed    Do not sit on low chairs, stoools or toilet seats, as it may be difficult to get up from low surfaces    Complete by:  As directed    Driving restrictions    Complete by:  As directed    No driving until released by the physician.   Increase activity slowly as tolerated    Complete by:  As directed  Lifting restrictions    Complete by:  As directed    No lifting until released by the physician.   Patient may shower    Complete by:  As directed    You may shower without a dressing once there is no drainage.  Do not wash over the wound.  If drainage remains, do not shower until drainage stops.   TED hose    Complete by:  As directed    Use stockings (TED hose) for 3 weeks on both leg(s).  You may remove them at night for sleeping.   Weight bearing as tolerated    Complete by:  As directed    Laterality:  left   Extremity:  Lower       Medication List    STOP taking these medications   amoxicillin 500 MG capsule Commonly known as:  AMOXIL   ergocalciferol 50000 units capsule Commonly known as:  VITAMIN D2   URIBEL 118 MG Caps     TAKE these medications   albuterol 108 (90 Base) MCG/ACT inhaler Commonly known as:  PROVENTIL HFA;VENTOLIN HFA Inhale 2 puffs into the lungs every 6 (six) hours as needed for wheezing.   ALPRAZolam 1 MG tablet Commonly known as:  XANAX Take 1 mg by mouth 3 (three) times daily.   beclomethasone 80 MCG/ACT inhaler Commonly known as:  QVAR Inhale 2 puffs into the lungs 2 (two) times  daily as needed (shortness of breath.).   buPROPion 300 MG 24 hr tablet Commonly known as:  WELLBUTRIN XL Take 300 mg by mouth every morning.   docusate sodium 100 MG capsule Commonly known as:  COLACE Take 1 capsule (100 mg total) by mouth 2 (two) times daily. What changed:  how much to take  when to take this  reasons to take this   DULoxetine 60 MG capsule Commonly known as:  CYMBALTA Take 60 mg by mouth 2 (two) times daily.   ELMIRON 100 MG capsule Generic drug:  pentosan polysulfate Take 100 mg by mouth 3 (three) times daily.   ferrous sulfate 325 (65 FE) MG tablet Take 1 tablet (325 mg total) by mouth 3 (three) times daily after meals.   fluticasone 50 MCG/ACT nasal spray Commonly known as:  FLONASE Place 2 sprays into both nostrils daily as needed for allergies or rhinitis.   hydrochlorothiazide 25 MG tablet Commonly known as:  HYDRODIURIL Take 25 mg by mouth daily with lunch.   hydrocortisone-pramoxine rectal foam Commonly known as:  PROCTOFOAM-HC Place 1 applicator rectally 2 (two) times daily as needed for hemorrhoids.   levothyroxine 125 MCG tablet Commonly known as:  SYNTHROID, LEVOTHROID Take 125 mcg by mouth daily before breakfast.   methocarbamol 500 MG tablet Commonly known as:  ROBAXIN Take 1 tablet (500 mg total) by mouth every 6 (six) hours as needed for muscle spasms. What changed:  Another medication with the same name was removed. Continue taking this medication, and follow the directions you see here.   nitrofurantoin (macrocrystal-monohydrate) 100 MG capsule Commonly known as:  MACROBID Take 100 mg by mouth daily.   NUCYNTA ER 150 MG Tb12 Generic drug:  Tapentadol HCl Take 1 tablet by mouth 2 (two) times daily.   omeprazole 20 MG capsule Commonly known as:  PRILOSEC Take 20 mg by mouth daily with lunch.   Oxycodone HCl 10 MG Tabs Take 1-2 tablets (10-20 mg total) by mouth every 3 (three) hours as needed for moderate pain or severe  pain. What changed:  how much to take  when to take this  reasons to take this   polycarbophil 625 MG tablet Commonly known as:  FIBERCON Take 1,250 mg by mouth 2 (two) times daily.   polyethylene glycol packet Commonly known as:  MIRALAX / GLYCOLAX Take 17 g by mouth daily as needed for moderate constipation.   potassium citrate 10 MEQ (1080 MG) SR tablet Commonly known as:  UROCIT-K Take 10 mEq by mouth at bedtime.   pregabalin 300 MG capsule Commonly known as:  LYRICA Take 300 mg by mouth 2 (two) times daily.   promethazine 25 MG tablet Commonly known as:  PHENERGAN Take 1 tablet (25 mg total) by mouth 3 (three) times daily as needed for nausea. What changed:  when to take this   rivaroxaban 10 MG Tabs tablet Commonly known as:  XARELTO Take 1 tablet (10 mg total) by mouth daily with breakfast. Take Xarelto for two and a half more weeks, then discontinue Xarelto. Once the patient has completed the blood thinner regimen, then take a Baby 81 mg Aspirin daily for three more weeks. Start taking on:  12/15/2015   sodium chloride 0.65 % Soln nasal spray Commonly known as:  OCEAN Place 1 spray into both nostrils daily as needed for congestion. After flonase.   tamsulosin 0.4 MG Caps capsule Commonly known as:  FLOMAX Take 0.4 mg by mouth daily with lunch.      Follow-up Information    Gearlean Alf, MD. Schedule an appointment as soon as possible for a visit on 12/28/2015.   Specialty:  Orthopedic Surgery Contact information: [address] [place] [address] [phone]           Signed: Arlee Muslim, PA-C Orthopaedic Surgery 12/14/2015, 10:37 PM

## 2015-12-14 NOTE — Progress Notes (Signed)
Physical Therapy Treatment Patient Details Name: MARITES NATH MRN: [id number] DOB: 1965-05-05 Today's Date: 12/14/2015    History of Present Illness Pt is a 50 y/o female s/p L TKA    PT Comments    Pt able to perform ther ex only due to pain and fatigue/lethargy (possibly secondary to medication). Pt had just been up with NT for bathing/dressing, did not want to mobilize again but was agreeable to ther ex. Planning for pt to ambulate and perform stairs tomorrow if she is able to tolerate it.  Follow Up Recommendations  Home health PT;Supervision for mobility/OOB     Equipment Recommendations  None recommended by PT    Recommendations for Other Services       Precautions / Restrictions Precautions Precautions: Fall;Knee Required Braces or Orthoses: Knee Immobilizer - Left Knee Immobilizer - Left: Discontinue once straight leg raise with < 10 degree lag Restrictions Weight Bearing Restrictions: No LLE Weight Bearing: Weight bearing as tolerated    Mobility  Bed Mobility                  Transfers                    Ambulation/Gait                 Stairs            Wheelchair Mobility    Modified Rankin (Stroke Patients Only)       Balance                                    Cognition Arousal/Alertness: Awake/alert (slightly drowsy but able to participate) Behavior During Therapy: [facility] for tasks assessed/performed (slightly drowsy but willing to participate) Overall Cognitive Status: Within Functional Limits for tasks assessed (slightly drowsy but willing to participate)                      Exercises Total Joint Exercises Ankle Circles/Pumps: AROM;20 reps;Both Quad Sets: AROM;Left;10 reps Short Arc QuadBarbaraann Boys;Left;10 reps Heel Slides: AAROM;Left;10 reps (self-assist using sheet) Hip ABduction/ADduction: AAROM;Left;10 reps Goniometric ROM: approx 50 deg active-assisted knee flexion in supine with HOB  elevated approx 30 deg; lacking approx 10 degrees knee extension    General Comments        Pertinent Vitals/Pain Pain Assessment: 0-10 Pain Score: 8  Pain Location: L knee Pain Descriptors / Indicators: Aching;Constant;Discomfort;Sore Pain Intervention(s): Limited activity within patient's tolerance;Monitored during session;Premedicated before session;Repositioned;Ice applied    Home Living                      Prior Function            PT Goals (current goals can now be found in the care plan section) Progress towards PT goals: Progressing toward goals    Frequency    7X/week      PT Plan Current plan remains appropriate    Co-evaluation             End of Session   Activity Tolerance: Patient limited by pain Patient left: in bed;with bed alarm set;with call bell/phone within reach;with family/visitor present     Time: 1410-1440 PT Time Calculation (min) (ACUTE ONLY): 30 min  Charges:  $Therapeutic Exercise: 8-22 mins  G CodesKaralee Height:      Verdine Grenfell 12/14/2015, 3:47 PM Karalee Heightachel Denney Shein, SPT

## 2015-12-14 NOTE — Progress Notes (Signed)
   Subjective: 1 Day Post-Op Procedure(s) (LRB): LEFT TOTAL KNEE ARTHROPLASTY (Left) Patient reports pain as mild and moderate.   Patient seen in rounds by Dr. Lequita HaltAluisio. Patient is well, but has had some minor complaints of pain in the knee, requiring pain medications We will start therapy today.  Plan is to go Home after hospital stay.  Objective: Vital signs in last 24 hours: Temp:  [98.1 F (36.7 C)-99.3 F (37.4 C)] 98.6 F (37 C) (10/03 2145) Pulse Rate:  [82-111] 111 (10/03 2145) Resp:  [16-20] 19 (10/03 2145) BP: (108-134)/(60-86) 134/86 (10/03 2145) SpO2:  [96 %-100 %] 98 % (10/03 2145)  Intake/Output from previous day:  Intake/Output Summary (Last 24 hours) at 12/14/15 2230 Last data filed at 12/14/15 2146  Gross per 24 hour  Intake          2474.17 ml  Output             2060 ml  Net           414.17 ml    Intake/Output this shift: Total I/O In: 60 [P.O.:60] Out: 400 [Urine:400]  Labs:  Recent Labs  12/14/15 0439  HGB 11.2*    Recent Labs  12/14/15 0439  WBC 8.9  RBC 4.21  HCT 35.6*  PLT 247    Recent Labs  12/14/15 0439  NA 141  K 4.5  CL 103  CO2 31  BUN 12  CREATININE 0.85  GLUCOSE 104*  CALCIUM 8.5*   No results for input(s): LABPT, INR in the last 72 hours.  EXAM General - Patient is Alert, Appropriate and Oriented Extremity - Neurovascular intact Sensation intact distally Dorsiflexion/Plantar flexion intact Dressing - dressing C/D/I Motor Function - intact, moving foot and toes well on exam.  Hemovac pulled without difficulty.  Past Medical History:  Diagnosis Date  . Anemia   . Anxiety   . Bronchitis    hx of   . Cervical pain (neck)   . Chronic pancreatitis ([facility])   . Constipation   . DDD (degenerative disc disease), cervical   . Depression   . GERD (gastroesophageal reflux disease)   . History of kidney stones   . History of pancreatitis 01-04-2011  . Hypothyroidism   . IBS (irritable bowel syndrome)   .  Nausea DUE TO PAIN FROM KIDNEY STONE  . Pancreatic pseudocyst   . Pneumonia    hx of pneumonia   . PONV (postoperative nausea and vomiting)   . Right flank pain   . Right knee pain   . Right ureteral stone   . Swelling of right knee joint   . Urinary tract infection    hx of     Assessment/Plan: 1 Day Post-Op Procedure(s) (LRB): LEFT TOTAL KNEE ARTHROPLASTY (Left) Active Problems:   OA (osteoarthritis) of knee  Estimated body mass index is 39.88 kg/m as calculated from the following:   Height as of this encounter: 5' 4.5" (1.638 m).   Weight as of this encounter: 107 kg (236 lb). Advance diet Up with therapy Plan for discharge tomorrow Discharge home with home health  DVT Prophylaxis - Xarelto Weight-Bearing as tolerated to left leg D/C O2 and Pulse OX and try on Room Air  Avel Peacerew Perkins, PA-C Orthopaedic Surgery 12/14/2015, 10:30 PM

## 2015-12-14 NOTE — Progress Notes (Signed)
OT Cancellation Note  Patient Details Name: [name] MRN: [id number] DOB: [birth year]   Cancelled Treatment:    Reason Eval/Treat Not Completed: Other (comment).  Pt is very sleepy at this time. She has a h/o R TKA and revision. Will check back to see if she has any OT needs.  Reannon Candella 12/14/2015, 11:44 AM  Marica OtterMaryellen Erwin Nishiyama, OTR/L [phone] 12/14/2015

## 2015-12-14 NOTE — Discharge Instructions (Signed)
° °  ° °Dr. Frank Aluisio °Total Joint Specialist °[facility] °[address] °[place], [place] [address] °[phone] ° °TOTAL KNEE REPLACEMENT POSTOPERATIVE DIRECTIONS ° °Knee Rehabilitation, Guidelines Following Surgery  °Results after knee surgery are often greatly improved when you follow the exercise, range of motion and muscle strengthening exercises prescribed by your doctor. Safety measures are also important to protect the knee from further injury. Any time any of these exercises cause you to have increased pain or swelling in your knee joint, decrease the amount until you are comfortable again and slowly increase them. If you have problems or questions, call your caregiver or physical therapist for advice.  ° °HOME CARE INSTRUCTIONS  °Remove items at home which could result in a fall. This includes throw rugs or furniture in walking pathways.  °· ICE to the affected knee every three hours for 30 minutes at a time and then as needed for pain and swelling.  Continue to use ice on the knee for pain and swelling from surgery. You may notice swelling that will progress down to the foot and ankle.  This is normal after surgery.  Elevate the leg when you are not up walking on it.   °· Continue to use the breathing machine which will help keep your temperature down.  It is common for your temperature to cycle up and down following surgery, especially at night when you are not up moving around and exerting yourself.  The breathing machine keeps your lungs expanded and your temperature down. °· Do not place pillow under knee, focus on keeping the knee straight while resting ° °DIET °You may resume your previous home diet once your are discharged from the hospital. ° °DRESSING / WOUND CARE / SHOWERING °You may shower 3 days after surgery, but keep the wounds dry during showering.  You may use an occlusive plastic wrap (Press'n Seal for example), NO SOAKING/SUBMERGING IN THE BATHTUB.  If the  bandage gets wet, change with a clean dry gauze.  If the incision gets wet, pat the wound dry with a clean towel. °You may start showering once you are discharged home but do not submerge the incision under water. Just pat the incision dry and apply a dry gauze dressing on daily. °Change the surgical dressing daily and reapply a dry dressing each time. ° °ACTIVITY °Walk with your walker as instructed. °Use walker as long as suggested by your caregivers. °Avoid periods of inactivity such as sitting longer than an hour when not asleep. This helps prevent blood clots.  °You may resume a sexual relationship in one month or when given the OK by your doctor.  °You may return to work once you are cleared by your doctor.  °Do not drive a car for 6 weeks or until released by you surgeon.  °Do not drive while taking narcotics. ° °WEIGHT BEARING °Weight bearing as tolerated with assist device (walker, cane, etc) as directed, use it as long as suggested by your surgeon or therapist, typically at least 4-6 weeks. ° °POSTOPERATIVE CONSTIPATION PROTOCOL °Constipation - defined medically as fewer than three stools per week and severe constipation as less than one stool per week. ° °One of the most common issues patients have following surgery is constipation.  Even if you have a regular bowel pattern at home, your normal regimen is likely to be disrupted due to multiple reasons following surgery.  Combination of anesthesia, postoperative narcotics, change in appetite and fluid intake all can affect   your bowels.  In order to avoid complications following surgery, here are some recommendations in order to help you during your recovery period. ° °Colace (docusate) - Pick up an over-the-counter form of Colace or another stool softener and take twice a day as long as you are requiring postoperative pain medications.  Take with a full glass of water daily.  If you experience loose stools or diarrhea, hold the colace until you stool forms  back up.  If your symptoms do not get better within 1 week or if they get worse, check with your doctor. ° °Dulcolax (bisacodyl) - Pick up over-the-counter and take as directed by the product packaging as needed to assist with the movement of your bowels.  Take with a full glass of water.  Use this product as needed if not relieved by Colace only.  ° °MiraLax (polyethylene glycol) - Pick up over-the-counter to have on hand.  MiraLax is a solution that will increase the amount of water in your bowels to assist with bowel movements.  Take as directed and can mix with a glass of water, juice, soda, coffee, or tea.  Take if you go more than two days without a movement. °Do not use MiraLax more than once per day. Call your doctor if you are still constipated or irregular after using this medication for 7 days in a row. ° °If you continue to have problems with postoperative constipation, please contact the office for further assistance and recommendations.  If you experience "the worst abdominal pain ever" or develop nausea or vomiting, please contact the office immediatly for further recommendations for treatment. ° °ITCHING ° If you experience itching with your medications, try taking only a single pain pill, or even half a pain pill at a time.  You can also use Benadryl over the counter for itching or also to help with sleep.  ° °TED HOSE STOCKINGS °Wear the elastic stockings on both legs for three weeks following surgery during the day but you may remove then at night for sleeping. ° °MEDICATIONS °See your medication summary on the “After Visit Summary” that the nursing staff will review with you prior to discharge.  You may have some home medications which will be placed on hold until you complete the course of blood thinner medication.  It is important for you to complete the blood thinner medication as prescribed by your surgeon.  Continue your approved medications as instructed at time of  discharge. ° °PRECAUTIONS °If you experience chest pain or shortness of breath - call 911 immediately for transfer to the hospital emergency department.  °If you develop a fever greater that 101 F, purulent drainage from wound, increased redness or drainage from wound, foul odor from the wound/dressing, or calf pain - CONTACT YOUR SURGEON.   °                                                °FOLLOW-UP APPOINTMENTS °Make sure you keep all of your appointments after your operation with your surgeon and caregivers. You should call the office at the above phone number and make an appointment for approximately two weeks after the date of your surgery or on the date instructed by your surgeon outlined in the "After Visit Summary". ° ° °RANGE OF MOTION AND STRENGTHENING EXERCISES  °Rehabilitation of the knee is important following a   knee injury or an operation. After just a few days of immobilization, the muscles of the thigh which control the knee become weakened and shrink (atrophy). Knee exercises are designed to build up the tone and strength of the thigh muscles and to improve knee motion. Often times heat used for twenty to thirty minutes before working out will loosen up your tissues and help with improving the range of motion but do not use heat for the first two weeks following surgery. These exercises can be done on a training (exercise) mat, on the floor, on a table or on a bed. Use what ever works the best and is most comfortable for you Knee exercises include:  °Leg Lifts - While your knee is still immobilized in a splint or cast, you can do straight leg raises. Lift the leg to 60 degrees, hold for 3 sec, and slowly lower the leg. Repeat 10-20 times 2-3 times daily. Perform this exercise against resistance later as your knee gets better.  °Quad and Hamstring Sets - Tighten up the muscle on the front of the thigh (Quad) and hold for 5-10 sec. Repeat this 10-20 times hourly. Hamstring sets are done by pushing the  foot backward against an object and holding for 5-10 sec. Repeat as with quad sets.  °· Leg Slides: Lying on your back, slowly slide your foot toward your buttocks, bending your knee up off the floor (only go as far as is comfortable). Then slowly slide your foot back down until your leg is flat on the floor again. °· Angel Wings: Lying on your back spread your legs to the side as far apart as you can without causing discomfort.  °A rehabilitation program following serious knee injuries can speed recovery and prevent re-injury in the future due to weakened muscles. Contact your doctor or a physical therapist for more information on knee rehabilitation.  ° °IF YOU ARE TRANSFERRED TO A SKILLED REHAB FACILITY °If the patient is transferred to a skilled rehab facility following release from the hospital, a list of the current medications will be sent to the facility for the patient to continue.  When discharged from the skilled rehab facility, please have the facility set up the patient's Home Health Physical Therapy prior to being released. Also, the skilled facility will be responsible for providing the patient with their medications at time of release from the facility to include their pain medication, the muscle relaxants, and their blood thinner medication. If the patient is still at the rehab facility at time of the two week follow up appointment, the skilled rehab facility will also need to assist the patient in arranging follow up appointment in our office and any transportation needs. ° °MAKE SURE YOU:  °Understand these instructions.  °Get help right away if you are not doing well or get worse.  ° ° °Pick up stool softner and laxative for home use following surgery while on pain medications. °Do not submerge incision under water. °Please use good hand washing techniques while changing dressing each day. °May shower starting three days after surgery. °Please use a clean towel to pat the incision dry following  showers. °Continue to use ice for pain and swelling after surgery. °Do not use any lotions or creams on the incision until instructed by your surgeon. ° °Take Xarelto for two and a half more weeks, then discontinue Xarelto. °Once the patient has completed the blood thinner regimen, then take a Baby 81 mg Aspirin daily for three more weeks. ° °  Information on my medicine - XARELTO® (Rivaroxaban) ° °This medication education was reviewed with me or my healthcare representative as part of my discharge preparation.  The pharmacist that spoke with me during my hospital stay was:  Williamson, Erin R, RPH ° °Why was Xarelto® prescribed for you? °Xarelto® was prescribed for you to reduce the risk of blood clots forming after orthopedic surgery. The medical term for these abnormal blood clots is venous thromboembolism (VTE). ° °What do you need to know about xarelto® ? °Take your Xarelto® ONCE DAILY at the same time every day. °You may take it either with or without food. ° °If you have difficulty swallowing the tablet whole, you may crush it and mix in applesauce just prior to taking your dose. ° °Take Xarelto® exactly as prescribed by your doctor and DO NOT stop taking Xarelto® without talking to the doctor who prescribed the medication.  Stopping without other VTE prevention medication to take the place of Xarelto® may increase your risk of developing a clot. ° °After discharge, you should have regular check-up appointments with your healthcare provider that is prescribing your Xarelto®.   ° °What do you do if you miss a dose? °If you miss a dose, take it as soon as you remember on the same day then continue your regularly scheduled once daily regimen the next day. Do not take two doses of Xarelto® on the same day.  ° °Important Safety Information °A possible side effect of Xarelto® is bleeding. You should call your healthcare provider right away if you experience any of the following: °? Bleeding from an injury or your  nose that does not stop. °? Unusual colored urine (red or dark brown) or unusual colored stools (red or black). °? Unusual bruising for unknown reasons. °? A serious fall or if you hit your head (even if there is no bleeding). ° °Some medicines may interact with Xarelto® and might increase your risk of bleeding while on Xarelto®. To help avoid this, consult your healthcare provider or pharmacist prior to using any new prescription or non-prescription medications, including herbals, vitamins, non-steroidal anti-inflammatory drugs (NSAIDs) and supplements. ° °This website has more information on Xarelto®: www.xarelto.com. ° ° ° °

## 2015-12-15 LAB — BASIC METABOLIC PANEL
Anion gap: 7 (ref 5–15)
BUN: 9 mg/dL (ref 6–20)
CHLORIDE: 95 mmol/L — AB (ref 101–111)
CO2: 34 mmol/L — AB (ref 22–32)
Calcium: 8.9 mg/dL (ref 8.9–10.3)
Creatinine, Ser: 0.73 mg/dL (ref 0.44–1.00)
GFR calc Af Amer: >60 mL/min (ref >60)
GFR calc non Af Amer: >60 mL/min (ref >60)
Glucose, Bld: 120 mg/dL — ABNORMAL HIGH (ref 65–99)
POTASSIUM: 3.6 mmol/L (ref 3.5–5.1)
Sodium: 136 mmol/L (ref 135–145)

## 2015-12-15 LAB — CBC
HEMATOCRIT: 36.5 % (ref 36.0–46.0)
Hemoglobin: 11.3 g/dL — ABNORMAL LOW (ref 12.0–15.0)
MCH: 26.1 pg (ref 26.0–34.0)
MCHC: 31 g/dL (ref 30.0–36.0)
MCV: 84.3 fL (ref 78.0–100.0)
Platelets: 230 10*3/uL (ref 150–400)
RBC: 4.33 MIL/uL (ref 3.87–5.11)
RDW: 14.4 % (ref 11.5–15.5)
WBC: 9.7 10*3/uL (ref 4.0–10.5)

## 2015-12-15 NOTE — Evaluation (Signed)
Occupational Therapy Evaluation Patient Details Name: Mary KillWendy W Rentz MRN: [id number] DOB: 1965-06-19 Today's Date: 12/15/2015    History of Present Illness Pt is a 50 y/o female s/p L TKA   Clinical Impression   Pt was admitted for the above surgery. All education was completed. Husband was present for part of session, and he cued her well.  Pt needs this for safety.  No further OT is needed at this time    Follow Up Recommendations  No OT follow up;Supervision/Assistance - 24 hour    Equipment Recommendations  None recommended by OT    Recommendations for Other Services       Precautions / Restrictions Precautions Precautions: Fall;Knee Required Braces or Orthoses: Knee Immobilizer - Left Knee Immobilizer - Left: Discontinue once straight leg raise with < 10 degree lag Restrictions LLE Weight Bearing: Weight bearing as tolerated      Mobility Bed Mobility Overal bed mobility: Needs Assistance Bed Mobility: Supine to Sit     Supine to sit: Min guard by PT     General bed mobility comments: oob  Transfers Overall transfer level: Needs assistance Equipment used: Rolling walker (2 wheeled) Transfers: Sit to/from Stand Sit to Stand: Min guard;Min assist         General transfer comment: close supervision to steadying assistance transition from sit to stand. Cues for UE/LE placement    Balance                                            ADL Overall ADL's : Needs assistance/impaired     Grooming: Wash/dry hands;Min guard;Standing                   Toilet Transfer: Minimal assistance;Ambulation;BSC;RW   Toileting- Clothing Manipulation and Hygiene: Minimal assistance;Sit to/from stand   Tub/ Shower Transfer: Walk-in shower;Minimal assistance;Ambulation     General ADL Comments: husband present for part of session and he cued pt.  He will help with adls as needed.  Practiced the above transfers with mod cues for sequencing and  safety     Vision     Perception     Praxis      Pertinent Vitals/Pain Pain Assessment: 0-10 Pain Score: 7  Pain Location: L knee and abdomen Pain Descriptors / Indicators: Aching;Sore Pain Intervention(s): Limited activity within patient's tolerance;Monitored during session;Premedicated before session;Repositioned;Ice applied     Hand Dominance     Extremity/Trunk Assessment Upper Extremity Assessment Upper Extremity Assessment: Overall WFL for tasks assessed           Communication Communication Communication: No difficulties   Cognition Arousal/Alertness: Awake/alert Behavior During Therapy: WFL for tasks assessed/performed Overall Cognitive Status: Impaired/Different from baseline Area of Impairment: Safety/judgement         Safety/Judgement: Decreased awareness of deficits;Decreased awareness of safety     General Comments: cues for sequence; assistance for walker distance; pt unpredictable at times with hand placement and turns   General Comments       Exercises       Shoulder Instructions      Home Living Family/patient expects to be discharged to:: Private residence Living Arrangements: Spouse/significant other                           Home Equipment: Bedside commode  Prior Functioning/Environment Level of Independence: Independent                 OT Problem List:     OT Treatment/Interventions:      OT Goals(Current goals can be found in the care plan section) Acute Rehab OT Goals Patient Stated Goal: none stated OT Goal Formulation: All assessment and education complete, DC therapy  OT Frequency:     Barriers to D/C:            Co-evaluation              End of Session    Activity Tolerance: Patient tolerated treatment well Patient left: in chair;with call bell/phone within reach;with chair alarm set;with family/visitor present   Time: 9604-5409 OT Time Calculation (min): 24 min Charges:   OT General Charges $OT Visit: 1 Procedure OT Evaluation $OT Eval Low Complexity: 1 Procedure OT Treatments $Self Care/Home Management : 8-22 mins G-Codes:    Xian Alves Jan 08, 2016, 1:11 PM Marica Otter, OTR/L [phone] 08-Jan-2016

## 2015-12-15 NOTE — Progress Notes (Signed)
   Subjective: 2 Days Post-Op Procedure(s) (LRB): LEFT TOTAL KNEE ARTHROPLASTY (Left) Patient reports pain as moderate.   Patient seen in rounds by Dr. Lequita HaltAluisio. Patient is having problems with pain in the knee, requiring pain medications. She has been on chronic pain medications for some time. Patient is ready to go home following two sessions of therapy.  Objective: Vital signs in last 24 hours: Temp:  [98.6 F (37 C)-98.9 F (37.2 C)] 98.9 F (37.2 C) (10/04 0549) Pulse Rate:  [93-111] 93 (10/04 0549) Resp:  [18-20] 20 (10/04 0549) BP: (128-134)/(74-86) 128/80 (10/04 0549) SpO2:  [96 %-98 %] 96 % (10/04 0549)  Intake/Output from previous day:  Intake/Output Summary (Last 24 hours) at 12/15/15 0802 Last data filed at 12/15/15 0600  Gross per 24 hour  Intake           739.17 ml  Output             2800 ml  Net         -2060.83 ml    Intake/Output this shift: No intake/output data recorded.  Labs:  Recent Labs  12/14/15 0439 12/15/15 0635  HGB 11.2* 11.3*    Recent Labs  12/14/15 0439 12/15/15 0635  WBC 8.9 9.7  RBC 4.21 4.33  HCT 35.6* 36.5  PLT 247 230    Recent Labs  12/14/15 0439 12/15/15 0635  NA 141 136  K 4.5 3.6  CL 103 95*  CO2 31 34*  BUN 12 9  CREATININE 0.85 0.73  GLUCOSE 104* 120*  CALCIUM 8.5* 8.9   No results for input(s): LABPT, INR in the last 72 hours.  EXAM: General - Patient is Alert, Appropriate and Oriented Extremity - Neurovascular intact Sensation intact distally Dorsiflexion/Plantar flexion intact Incision - clean, dry, no drainage Motor Function - intact, moving foot and toes well on exam.   Assessment/Plan: 2 Days Post-Op Procedure(s) (LRB): LEFT TOTAL KNEE ARTHROPLASTY (Left) Procedure(s) (LRB): LEFT TOTAL KNEE ARTHROPLASTY (Left) Past Medical History:  Diagnosis Date  . Anemia   . Anxiety   . Bronchitis    hx of   . Cervical pain (neck)   . Chronic pancreatitis (HCC)   . Constipation   . DDD  (degenerative disc disease), cervical   . Depression   . GERD (gastroesophageal reflux disease)   . History of kidney stones   . History of pancreatitis 01-04-2011  . Hypothyroidism   . IBS (irritable bowel syndrome)   . Nausea DUE TO PAIN FROM KIDNEY STONE  . Pancreatic pseudocyst   . Pneumonia    hx of pneumonia   . PONV (postoperative nausea and vomiting)   . Right flank pain   . Right knee pain   . Right ureteral stone   . Swelling of right knee joint   . Urinary tract infection    hx of    Active Problems:   OA (osteoarthritis) of knee  Estimated body mass index is 39.88 kg/m as calculated from the following:   Height as of this encounter: 5' 4.5" (1.638 m).   Weight as of this encounter: 107 kg (236 lb). Up with therapy Discharge home with home health Diet - Regular diet Follow up - in 2 weeks Activity - WBAT Disposition - Home Condition Upon Discharge - Stable D/C Meds - See DC Summary DVT Prophylaxis - Xarelto  Avel Peacerew Perkins, PA-C Orthopaedic Surgery 12/15/2015, 8:02 AM

## 2015-12-15 NOTE — Progress Notes (Signed)
Physical Therapy Treatment Patient Details Name: Roger KillWendy W Pribble MRN: [id number] DOB: 1965-09-08 Today's Date: 12/15/2015    History of Present Illness Pt is a 50 y/o female s/p L TKA    PT Comments    Pt requiring increased verbal cues for technique and safety due to presentation of cognitive impairments (possibly from meds?).  Pt assisted with ambulating in hallway and required increased time.  Requested pt call her spouse end of session in preparation for his presence this afternoon for stair education/performance.   Follow Up Recommendations  Home health PT;Supervision for mobility/OOB     Equipment Recommendations  None recommended by PT    Recommendations for Other Services       Precautions / Restrictions Precautions Precautions: Fall;Knee Required Braces or Orthoses: Knee Immobilizer - Left Knee Immobilizer - Left: Discontinue once straight leg raise with < 10 degree lag Restrictions LLE Weight Bearing: Weight bearing as tolerated    Mobility  Bed Mobility Overal bed mobility: Needs Assistance Bed Mobility: Supine to Sit     Supine to sit: Min guard     General bed mobility comments: cues for self assist L LE, increased time and effort  Transfers Overall transfer level: Needs assistance Equipment used: Rolling walker (2 wheeled) Transfers: Sit to/from Stand Sit to Stand: Min guard         General transfer comment: verbal cues for UE and LE positioning  Ambulation/Gait Ambulation/Gait assistance: Min guard Ambulation Distance (Feet): 80 Feet Assistive device: Rolling walker (2 wheeled) Gait Pattern/deviations: Step-to pattern;Antalgic;Decreased step length - left     General Gait Details: max verbal cues for sequence and safe RW positioning, slow pace   Stairs            Wheelchair Mobility    Modified Rankin (Stroke Patients Only)       Balance                                    Cognition Arousal/Alertness:  Awake/alert (however quickly falls asleep, groggy, drowsy possibly from meds)   Overall Cognitive Status: Impaired/Different from baseline Area of Impairment: Safety/judgement         Safety/Judgement: Decreased awareness of deficits;Decreased awareness of safety     General Comments: pt appears drowsy and groggy, follow commands however presents with poor safety/judgement, requiring cues    Exercises      General Comments        Pertinent Vitals/Pain Pain Assessment: 0-10 Pain Score: 5  Pain Location: L knee Pain Descriptors / Indicators: Aching;Sore Pain Intervention(s): Limited activity within patient's tolerance;Monitored during session;Premedicated before session;Ice applied;Repositioned    Home Living                      Prior Function            PT Goals (current goals can now be found in the care plan section) Progress towards PT goals: Progressing toward goals    Frequency    7X/week      PT Plan Current plan remains appropriate    Co-evaluation             End of Session Equipment Utilized During Treatment: Gait belt;Left knee immobilizer Activity Tolerance: Patient limited by pain Patient left: in chair;with call bell/phone within reach;with chair alarm set     Time: [id number] PT Time Calculation (min) (ACUTE ONLY): 23 min  Charges:  $  Gait Training: 23-37 mins                    G Codes:      Voshon Petro,KATHrine E December 26, 2015, 12:19 PM Zenovia Jarred, PT, DPT 2015/12/26 Pager: [phone]

## 2015-12-15 NOTE — Care Management Note (Signed)
Case Management Note  Patient Details  Name: [name] MRN: [id number] Date of Birth: October 05, 1965  Subjective/Objective:                  LEFT TOTAL KNEE ARTHROPLASTY (Left) Action/Plan: Discharge planning Expected Discharge Date:  12/15/15               Expected Discharge Plan:  [place]  In-House Referral:     Discharge planning Services  CM Consult  Post Acute Care Choice:  Home Health Choice offered to:  Patient  DME Arranged:  N/A DME Agency:  NA  HH Arranged:  PT [place] Agency:  [facility] ([facility]  Status of Service:  Completed, signed off  If discussed at [employer], dates discussed:    Additional Comments: CM met with pt to confirm choice of home health agency; pt's husband approached CM and states he misspoke yesterday and chose [facility] but really wishes to have [facility].   CM spoke with pt who confirms, [facility], is indeed her choice of home health agency.  CM called [facility] to explain cancellation and contacted [facility] rep, Tim with referral.  Pt states she has all DME needed at home.  No other CM needs were communicated. Dellie Catholic, RN 12/15/2015, 11:09 AM

## 2015-12-15 NOTE — Progress Notes (Signed)
Physical Therapy Treatment Note    12/15/15 1500  PT Visit Information  Last PT Received On 12/15/15  Assistance Needed +1  History of Present Illness Pt is a 50 y/o female s/p L TKA  Subjective Data  Subjective Pt ambulated again in hallway short distance and practiced stair technique with spouse assisting.  Pt and spouse feel ready for d/c home.  Recommended pt only mobilize when spouse available to assist for safety, also aware to maintain KI until able to perform SLR.  Precautions  Precautions Fall;Knee  Required Braces or Orthoses Knee Immobilizer - Left  Knee Immobilizer - Left Discontinue once straight leg raise with < 10 degree lag  Restrictions  LLE Weight Bearing WBAT  Pain Assessment  Pain Assessment 0-10  Pain Score 7  Pain Location L knee  Pain Descriptors / Indicators Aching;Sore  Pain Intervention(s) Limited activity within patient's tolerance;Monitored during session;Repositioned  Cognition  Arousal/Alertness Awake/alert  Behavior During Therapy WFL for tasks assessed/performed  Overall Cognitive Status Impaired/Different from baseline  Area of Impairment Safety/judgement  Safety/Judgement Decreased awareness of deficits;Decreased awareness of safety  General Comments requires cues for safety  Transfers  Overall transfer level Needs assistance  Equipment used Rolling walker (2 wheeled)  Transfers Sit to/from Stand  Sit to Stand Min guard  General transfer comment verbal cues for safe technique, min/guard for safety  Ambulation/Gait  Ambulation/Gait assistance Min guard  Ambulation Distance (Feet) 40 Feet  Assistive device Rolling walker (2 wheeled)  Gait Pattern/deviations Step-to pattern;Decreased stance time - left;Antalgic  General Gait Details max verbal cues for sequence and safe RW positioning, slow pace  Stairs Yes  Stairs assistance Min assist  Stair Management Forwards;Step to pattern (held onto spouses UEs for support)  Number of Stairs 3   General stair comments pt performed 2 taller steps ascending then descended 3 shorter steps, spouse assisted in front of pt with UE support (states this is how they performed this for surgeries with her R leg), slight steadying assist provided from PT behind pt, strongly recommended spouse have another person behind pt when peforming steps for safety.  PT - End of Session  Equipment Utilized During Treatment Left knee immobilizer  Activity Tolerance Patient limited by pain  Patient left in chair;with call bell/phone within reach;with chair alarm set;with family/visitor present  PT - Assessment/Plan  PT Plan Current plan remains appropriate  PT Frequency (ACUTE ONLY) 7X/week  Follow Up Recommendations Home health PT;Supervision for mobility/OOB  PT equipment None recommended by PT  PT Goal Progression  Progress towards PT goals Progressing toward goals  PT Time Calculation  PT Start Time (ACUTE ONLY) 1335  PT Stop Time (ACUTE ONLY) 1351  PT Time Calculation (min) (ACUTE ONLY) 16 min  PT General Charges  $$ ACUTE PT VISIT 1 Procedure  PT Treatments  $Gait Training 8-22 mins   Zenovia JarredKati Janna Oak, PT, DPT 12/15/2015 Pager: [phone]

## 2015-12-16 DIAGNOSIS — Z7901 Long term (current) use of anticoagulants: Secondary | ICD-10-CM | POA: Diagnosis not present

## 2015-12-16 DIAGNOSIS — K581 Irritable bowel syndrome with constipation: Secondary | ICD-10-CM | POA: Diagnosis not present

## 2015-12-16 DIAGNOSIS — F329 Major depressive disorder, single episode, unspecified: Secondary | ICD-10-CM | POA: Diagnosis not present

## 2015-12-16 DIAGNOSIS — F419 Anxiety disorder, unspecified: Secondary | ICD-10-CM | POA: Diagnosis not present

## 2015-12-16 DIAGNOSIS — Z96653 Presence of artificial knee joint, bilateral: Secondary | ICD-10-CM | POA: Diagnosis not present

## 2015-12-16 DIAGNOSIS — Z79891 Long term (current) use of opiate analgesic: Secondary | ICD-10-CM | POA: Diagnosis not present

## 2015-12-16 DIAGNOSIS — Z981 Arthrodesis status: Secondary | ICD-10-CM | POA: Diagnosis not present

## 2015-12-16 DIAGNOSIS — Z471 Aftercare following joint replacement surgery: Secondary | ICD-10-CM | POA: Diagnosis not present

## 2015-12-16 DIAGNOSIS — Z6839 Body mass index (BMI) 39.0-39.9, adult: Secondary | ICD-10-CM | POA: Diagnosis not present

## 2015-12-16 DIAGNOSIS — K219 Gastro-esophageal reflux disease without esophagitis: Secondary | ICD-10-CM | POA: Diagnosis not present

## 2015-12-17 DIAGNOSIS — Z7901 Long term (current) use of anticoagulants: Secondary | ICD-10-CM | POA: Diagnosis not present

## 2015-12-17 DIAGNOSIS — Z96653 Presence of artificial knee joint, bilateral: Secondary | ICD-10-CM | POA: Diagnosis not present

## 2015-12-17 DIAGNOSIS — F329 Major depressive disorder, single episode, unspecified: Secondary | ICD-10-CM | POA: Diagnosis not present

## 2015-12-17 DIAGNOSIS — K581 Irritable bowel syndrome with constipation: Secondary | ICD-10-CM | POA: Diagnosis not present

## 2015-12-17 DIAGNOSIS — F419 Anxiety disorder, unspecified: Secondary | ICD-10-CM | POA: Diagnosis not present

## 2015-12-17 DIAGNOSIS — Z471 Aftercare following joint replacement surgery: Secondary | ICD-10-CM | POA: Diagnosis not present

## 2015-12-17 DIAGNOSIS — K219 Gastro-esophageal reflux disease without esophagitis: Secondary | ICD-10-CM | POA: Diagnosis not present

## 2015-12-17 DIAGNOSIS — Z981 Arthrodesis status: Secondary | ICD-10-CM | POA: Diagnosis not present

## 2015-12-17 DIAGNOSIS — Z79891 Long term (current) use of opiate analgesic: Secondary | ICD-10-CM | POA: Diagnosis not present

## 2015-12-17 DIAGNOSIS — Z6839 Body mass index (BMI) 39.0-39.9, adult: Secondary | ICD-10-CM | POA: Diagnosis not present

## 2015-12-20 DIAGNOSIS — Z79891 Long term (current) use of opiate analgesic: Secondary | ICD-10-CM | POA: Diagnosis not present

## 2015-12-20 DIAGNOSIS — Z471 Aftercare following joint replacement surgery: Secondary | ICD-10-CM | POA: Diagnosis not present

## 2015-12-20 DIAGNOSIS — K219 Gastro-esophageal reflux disease without esophagitis: Secondary | ICD-10-CM | POA: Diagnosis not present

## 2015-12-20 DIAGNOSIS — Z7901 Long term (current) use of anticoagulants: Secondary | ICD-10-CM | POA: Diagnosis not present

## 2015-12-20 DIAGNOSIS — F419 Anxiety disorder, unspecified: Secondary | ICD-10-CM | POA: Diagnosis not present

## 2015-12-20 DIAGNOSIS — Z96653 Presence of artificial knee joint, bilateral: Secondary | ICD-10-CM | POA: Diagnosis not present

## 2015-12-20 DIAGNOSIS — F329 Major depressive disorder, single episode, unspecified: Secondary | ICD-10-CM | POA: Diagnosis not present

## 2015-12-20 DIAGNOSIS — Z6839 Body mass index (BMI) 39.0-39.9, adult: Secondary | ICD-10-CM | POA: Diagnosis not present

## 2015-12-20 DIAGNOSIS — K581 Irritable bowel syndrome with constipation: Secondary | ICD-10-CM | POA: Diagnosis not present

## 2015-12-20 DIAGNOSIS — Z981 Arthrodesis status: Secondary | ICD-10-CM | POA: Diagnosis not present

## 2015-12-24 DIAGNOSIS — Z96653 Presence of artificial knee joint, bilateral: Secondary | ICD-10-CM | POA: Diagnosis not present

## 2015-12-24 DIAGNOSIS — Z981 Arthrodesis status: Secondary | ICD-10-CM | POA: Diagnosis not present

## 2015-12-24 DIAGNOSIS — Z7901 Long term (current) use of anticoagulants: Secondary | ICD-10-CM | POA: Diagnosis not present

## 2015-12-24 DIAGNOSIS — K219 Gastro-esophageal reflux disease without esophagitis: Secondary | ICD-10-CM | POA: Diagnosis not present

## 2015-12-24 DIAGNOSIS — Z6839 Body mass index (BMI) 39.0-39.9, adult: Secondary | ICD-10-CM | POA: Diagnosis not present

## 2015-12-24 DIAGNOSIS — F329 Major depressive disorder, single episode, unspecified: Secondary | ICD-10-CM | POA: Diagnosis not present

## 2015-12-24 DIAGNOSIS — F419 Anxiety disorder, unspecified: Secondary | ICD-10-CM | POA: Diagnosis not present

## 2015-12-24 DIAGNOSIS — Z79891 Long term (current) use of opiate analgesic: Secondary | ICD-10-CM | POA: Diagnosis not present

## 2015-12-24 DIAGNOSIS — K581 Irritable bowel syndrome with constipation: Secondary | ICD-10-CM | POA: Diagnosis not present

## 2015-12-24 DIAGNOSIS — Z471 Aftercare following joint replacement surgery: Secondary | ICD-10-CM | POA: Diagnosis not present

## 2015-12-27 DIAGNOSIS — Z96653 Presence of artificial knee joint, bilateral: Secondary | ICD-10-CM | POA: Diagnosis not present

## 2015-12-27 DIAGNOSIS — Z79891 Long term (current) use of opiate analgesic: Secondary | ICD-10-CM | POA: Diagnosis not present

## 2015-12-27 DIAGNOSIS — F419 Anxiety disorder, unspecified: Secondary | ICD-10-CM | POA: Diagnosis not present

## 2015-12-27 DIAGNOSIS — Z981 Arthrodesis status: Secondary | ICD-10-CM | POA: Diagnosis not present

## 2015-12-27 DIAGNOSIS — K219 Gastro-esophageal reflux disease without esophagitis: Secondary | ICD-10-CM | POA: Diagnosis not present

## 2015-12-27 DIAGNOSIS — F329 Major depressive disorder, single episode, unspecified: Secondary | ICD-10-CM | POA: Diagnosis not present

## 2015-12-27 DIAGNOSIS — Z6839 Body mass index (BMI) 39.0-39.9, adult: Secondary | ICD-10-CM | POA: Diagnosis not present

## 2015-12-27 DIAGNOSIS — Z7901 Long term (current) use of anticoagulants: Secondary | ICD-10-CM | POA: Diagnosis not present

## 2015-12-27 DIAGNOSIS — Z471 Aftercare following joint replacement surgery: Secondary | ICD-10-CM | POA: Diagnosis not present

## 2015-12-27 DIAGNOSIS — K581 Irritable bowel syndrome with constipation: Secondary | ICD-10-CM | POA: Diagnosis not present

## 2016-01-01 DIAGNOSIS — F4322 Adjustment disorder with anxiety: Secondary | ICD-10-CM | POA: Diagnosis not present

## 2016-01-01 DIAGNOSIS — F3342 Major depressive disorder, recurrent, in full remission: Secondary | ICD-10-CM | POA: Diagnosis not present

## 2016-01-18 DIAGNOSIS — Z96652 Presence of left artificial knee joint: Secondary | ICD-10-CM | POA: Diagnosis not present

## 2016-01-18 DIAGNOSIS — M1712 Unilateral primary osteoarthritis, left knee: Secondary | ICD-10-CM | POA: Diagnosis not present

## 2016-01-27 DIAGNOSIS — R112 Nausea with vomiting, unspecified: Secondary | ICD-10-CM | POA: Diagnosis not present

## 2016-01-27 DIAGNOSIS — R109 Unspecified abdominal pain: Secondary | ICD-10-CM | POA: Diagnosis not present

## 2016-01-27 DIAGNOSIS — K5900 Constipation, unspecified: Secondary | ICD-10-CM | POA: Diagnosis not present

## 2016-02-09 DIAGNOSIS — J0100 Acute maxillary sinusitis, unspecified: Secondary | ICD-10-CM | POA: Diagnosis not present

## 2016-02-09 DIAGNOSIS — J209 Acute bronchitis, unspecified: Secondary | ICD-10-CM | POA: Diagnosis not present

## 2016-02-09 DIAGNOSIS — R0602 Shortness of breath: Secondary | ICD-10-CM | POA: Diagnosis not present

## 2016-03-14 DIAGNOSIS — G8929 Other chronic pain: Secondary | ICD-10-CM | POA: Diagnosis not present

## 2016-03-14 DIAGNOSIS — M4150 Other secondary scoliosis, site unspecified: Secondary | ICD-10-CM | POA: Diagnosis not present

## 2016-03-14 DIAGNOSIS — M545 Low back pain: Secondary | ICD-10-CM | POA: Diagnosis not present

## 2016-03-14 DIAGNOSIS — M5136 Other intervertebral disc degeneration, lumbar region: Secondary | ICD-10-CM | POA: Diagnosis not present

## 2016-04-04 DIAGNOSIS — R319 Hematuria, unspecified: Secondary | ICD-10-CM | POA: Diagnosis not present

## 2016-04-04 DIAGNOSIS — N390 Urinary tract infection, site not specified: Secondary | ICD-10-CM | POA: Diagnosis not present

## 2016-04-27 DIAGNOSIS — M47812 Spondylosis without myelopathy or radiculopathy, cervical region: Secondary | ICD-10-CM | POA: Diagnosis not present

## 2016-04-27 DIAGNOSIS — Z79891 Long term (current) use of opiate analgesic: Secondary | ICD-10-CM | POA: Diagnosis not present

## 2016-04-27 DIAGNOSIS — M47817 Spondylosis without myelopathy or radiculopathy, lumbosacral region: Secondary | ICD-10-CM | POA: Diagnosis not present

## 2016-04-27 DIAGNOSIS — M961 Postlaminectomy syndrome, not elsewhere classified: Secondary | ICD-10-CM | POA: Diagnosis not present

## 2016-04-27 DIAGNOSIS — G894 Chronic pain syndrome: Secondary | ICD-10-CM | POA: Diagnosis not present

## 2016-04-28 ENCOUNTER — Telehealth: Payer: Self-pay | Admitting: *Deleted

## 2016-04-28 NOTE — Telephone Encounter (Signed)
I'm returning your call.  You are going to need to see Dr. Charlsie Merlesegal for a consult.  Your consent has expired.  "Okay, I kind of figured that.  Can you schedule it for me?"  Let me transfer you to a scheduler.

## 2016-04-28 NOTE — Telephone Encounter (Signed)
"  I'm calling to reschedule my surgery with Dr. Charlsie Merlesegal.  I had to cancel it to have surgery on my knee.  It's doing well now so I want to have my foot surgery.  I don't know if I need to see him again or not.  Please give me a call."

## 2016-05-01 DIAGNOSIS — R198 Other specified symptoms and signs involving the digestive system and abdomen: Secondary | ICD-10-CM | POA: Diagnosis not present

## 2016-05-01 DIAGNOSIS — R112 Nausea with vomiting, unspecified: Secondary | ICD-10-CM | POA: Diagnosis not present

## 2016-05-04 ENCOUNTER — Encounter: Payer: Self-pay | Admitting: Podiatry

## 2016-05-04 ENCOUNTER — Ambulatory Visit (INDEPENDENT_AMBULATORY_CARE_PROVIDER_SITE_OTHER): Payer: BLUE CROSS/BLUE SHIELD | Admitting: Podiatry

## 2016-05-04 ENCOUNTER — Ambulatory Visit (INDEPENDENT_AMBULATORY_CARE_PROVIDER_SITE_OTHER): Payer: BLUE CROSS/BLUE SHIELD

## 2016-05-04 VITALS — BP 96/71 | HR 84 | Resp 16

## 2016-05-04 DIAGNOSIS — M2021 Hallux rigidus, right foot: Secondary | ICD-10-CM | POA: Diagnosis not present

## 2016-05-04 DIAGNOSIS — M79676 Pain in unspecified toe(s): Secondary | ICD-10-CM

## 2016-05-04 DIAGNOSIS — M79671 Pain in right foot: Secondary | ICD-10-CM

## 2016-05-04 NOTE — Patient Instructions (Signed)

## 2016-05-05 NOTE — Progress Notes (Signed)
Subjective:     Patient ID: [name], female   DOB: Mar 30, 1965, 51 y.o.   MRN: [id number]  HPI patient states everything else is feeling pretty good but I'm having a lot of pain in my big toe joint right and I need to get it fixed. Patient states that it's been getting gradually worse and making it hard to walk and the left one did wonderful with surgery   Review of Systems     Objective:   Physical Exam Neurovascular status intact negative Homans sign was noted with patient found to have exquisite discomfort first MPJ right with limitation of motion and swelling of the dorsal surface. Patient has mild midtarsal joint swelling from previous injury but that appears to be improving with minimal discomfort    Assessment:     Hallux limitus deformity with inflammatory capsulitis along with healing stress fracture right    Plan:     H&P condition reviewed and discussed correction. Patient wants surgery and at this point I did allow her to read consent form going over alternative treatments complications. [facility] understanding risk and at this time she signs consent form will utilize air fracture walker postoperatively and started preoperatively and understands recovery can take 6 months to a year and if it turns out the joint it is not of good condition ultimately could require joint fusion or implantation procedure  X-ray indicated there is been a slight increase in deformity with spur formation narrowing of the joint surface noted

## 2016-05-11 ENCOUNTER — Telehealth: Payer: Self-pay | Admitting: *Deleted

## 2016-05-11 NOTE — Telephone Encounter (Signed)
"  I'm scheduled for surgery on Tuesday with Dr. Charlsie Merlesegal.  I told him I had a boot here at home.  My husband and I looked at it and one of the velcro straps doesn't stick.  So, I have decided I better go ahead and get another one to make sure my foot heals properly."  What size shoe do you wear?  I will leave a boot at check-out for you.  "Can someone bring it day of surgery?"  No one can take your boot to surgical center.  "Is it okay for my husband to pick it up?"  Yes, your husband can pick it up.  "How much will I owe Dr. Charlsie Merlesegal?  I haven't heard anything yet.  I got it from the anesthesiologist."  I will get Chip BoerVicki to give you a call with an estimate.  ([facility] rt. 45 minutes)

## 2016-05-11 NOTE — Telephone Encounter (Signed)
"  I didn't know how much the boot costs.  I might be able to work with this one.  Call me back.  Thank you very much."  "The lady that called about anesthesia asked me if Dr. Charlsie Merlesegal was going to do a block.  We are willing to pay for it if it will keep me from hurting longer.  Give me a call back. I hate to bother you so much."

## 2016-05-12 NOTE — Telephone Encounter (Signed)
"  I'm calling in reference to my wife.  We got a cost from anesthesia, one with a block and the other without a block.  I assume she wants the one with the block.  I want to figure out if that is what the doctor wants.  Two, the surgical center has not called us about the Tuesday surgery."  I called and informed Aram [facility]Cynthia [facility] that patient is requesting a block from anesthesia prior to surgery.  She asked that I tell patient her arrival time has changed to 7:30 am.  I'm returning your call.  I apologize for not calling you back yesterday.  I was awaiting a response from Dr. Charlsie Merlesegal.  He said that you could get a block if you like.  So I just called and informed Aram BeechamCynthia at the surgical center.  She said to tell you your arrival time has changed.  You will need to be there at 7:30am.  "Okay, I need to be there at 7:30 am instead of 8 am, that will be fine.  Thank you so much for all your help.  Do I need to pay more money in advance to [facility]?"  We do not have contact with the anesthesiologist.  "I'll give them a call."

## 2016-05-16 ENCOUNTER — Encounter: Payer: Self-pay | Admitting: Podiatry

## 2016-05-16 DIAGNOSIS — K219 Gastro-esophageal reflux disease without esophagitis: Secondary | ICD-10-CM | POA: Diagnosis not present

## 2016-05-16 DIAGNOSIS — M25571 Pain in right ankle and joints of right foot: Secondary | ICD-10-CM | POA: Diagnosis not present

## 2016-05-16 DIAGNOSIS — G8918 Other acute postprocedural pain: Secondary | ICD-10-CM | POA: Diagnosis not present

## 2016-05-16 DIAGNOSIS — M2011 Hallux valgus (acquired), right foot: Secondary | ICD-10-CM | POA: Diagnosis not present

## 2016-05-16 DIAGNOSIS — M2010 Hallux valgus (acquired), unspecified foot: Secondary | ICD-10-CM | POA: Diagnosis not present

## 2016-05-16 DIAGNOSIS — M21621 Bunionette of right foot: Secondary | ICD-10-CM | POA: Diagnosis not present

## 2016-05-19 ENCOUNTER — Other Ambulatory Visit: Payer: Self-pay | Admitting: Podiatry

## 2016-05-19 MED ORDER — OXYCODONE-ACETAMINOPHEN 5-325 MG PO TABS: ORAL_TABLET | ORAL | 0 refills | Status: DC

## 2016-05-19 NOTE — Telephone Encounter (Addendum)
Dr. Charlsie Merlesegal ordered Percocet 5/325mg  #30 one or two tablets every 4-6 hours prn foot pain.Unable to speak with pt's husband on home phone. I informed Casimiro NeedleMichael, pt's husband of Dr. Beverlee Nimsegal's orders. Pt called states she saw a call from this number. I told her I had tried to get her husband, he had called for her pain medication. Pt states she is having some pain in the big toe, and is using a walker, not the crutches. I told her that was fine.

## 2016-05-19 NOTE — Telephone Encounter (Signed)
Pt. Husband called to get refill.

## 2016-05-19 NOTE — Addendum Note (Signed)
Addended by: Alphia Kava'CONNELL, Mone Commisso D on: 05/19/2016 02:56 PM   Modules accepted: Orders

## 2016-05-25 ENCOUNTER — Other Ambulatory Visit: Payer: BLUE CROSS/BLUE SHIELD

## 2016-05-25 DIAGNOSIS — M47817 Spondylosis without myelopathy or radiculopathy, lumbosacral region: Secondary | ICD-10-CM | POA: Diagnosis not present

## 2016-05-25 DIAGNOSIS — M961 Postlaminectomy syndrome, not elsewhere classified: Secondary | ICD-10-CM | POA: Diagnosis not present

## 2016-05-25 DIAGNOSIS — G894 Chronic pain syndrome: Secondary | ICD-10-CM | POA: Diagnosis not present

## 2016-05-25 DIAGNOSIS — M47812 Spondylosis without myelopathy or radiculopathy, cervical region: Secondary | ICD-10-CM | POA: Diagnosis not present

## 2016-05-26 ENCOUNTER — Ambulatory Visit (INDEPENDENT_AMBULATORY_CARE_PROVIDER_SITE_OTHER): Payer: BLUE CROSS/BLUE SHIELD | Admitting: Podiatry

## 2016-05-26 ENCOUNTER — Ambulatory Visit (INDEPENDENT_AMBULATORY_CARE_PROVIDER_SITE_OTHER): Payer: BLUE CROSS/BLUE SHIELD

## 2016-05-26 DIAGNOSIS — M2021 Hallux rigidus, right foot: Secondary | ICD-10-CM | POA: Diagnosis not present

## 2016-05-26 DIAGNOSIS — Z9889 Other specified postprocedural states: Secondary | ICD-10-CM | POA: Diagnosis not present

## 2016-05-26 MED ORDER — OXYCODONE-ACETAMINOPHEN 5-325 MG PO TABS: ORAL_TABLET | ORAL | 0 refills | Status: DC

## 2016-05-28 NOTE — Progress Notes (Signed)
Subjective:     Patient ID: [name], female   DOB: 04/22/1965, 51 y.o.   MRN: [id number]  HPI patient states she's doing very well with her left foot with discomfort still present but improved   Review of Systems     Objective:   Physical Exam Neurovascular status intact negative Homans sign was noted with patient's wound edges left doing well with wound edges well coapted good alignment and good dorsi and plantarflexion of the first MPJ    Assessment:     Doing well post osteotomy first metatarsal left with good alignment    Plan:     H&P x-rays reviewed and today advised on range of motion exercises I really applied sterile dressing I advised on continued immobilization and elevation. May gradually begin getting issue wet and will be seen back in 2 weeks or earlier if needed  X-ray report indicates the pins are in place joint congruence with good alignment

## 2016-05-29 ENCOUNTER — Telehealth: Payer: Self-pay | Admitting: *Deleted

## 2016-05-29 NOTE — Telephone Encounter (Addendum)
Pt states she still has some areas of small blood spots on the ace and she would like to know if she could cover it when she shower. I told pt that until she felt comfortable she could continue to shower as she did after the surgery covering the foot, and she could put a thin coating of neosporin over the sites of concern and cover with non-stick dressing and wrap with an ace. Pt states understanding. 06/05/2016-Pt states she continues to have pain and burning and her husband told her to stop the Neosporin so the area would heal. I asked pt if the discomfort she had was from the incision site or the bone/internal surgery. Pt states she has occasional shooting pt in the toe and joint. I told pt that if she was in the surgery shoe and not the surgical boot, the shoe would allow more movement of the joint and may cause discomfort if she was doing too much. I told pt to back off on the activity and continue the surgical shoe and boot wear as prescribed by Dr. Charlsie Merlesegal and use the ace over telfa covered incision site to help decrease swelling and don't be active to the point of discomfort.06/15/2016-Pt states the compression sock is too tight to wear and she started back with the ace wrap. I told her that was fine, to continue with the ace wrap another week, the try the compression sock putting it on 1st thing in the morning before swinging her foot over the side of the bed, and see if it did better then. Pt states understanding and needs more pain medication she thinks she over did the bending of the toe. I told pt I would have the percocet available for pick up tomorrow in the [place] office.

## 2016-06-02 DIAGNOSIS — M47817 Spondylosis without myelopathy or radiculopathy, lumbosacral region: Secondary | ICD-10-CM | POA: Diagnosis not present

## 2016-06-12 ENCOUNTER — Ambulatory Visit (INDEPENDENT_AMBULATORY_CARE_PROVIDER_SITE_OTHER): Payer: BLUE CROSS/BLUE SHIELD

## 2016-06-12 ENCOUNTER — Ambulatory Visit (INDEPENDENT_AMBULATORY_CARE_PROVIDER_SITE_OTHER): Payer: BLUE CROSS/BLUE SHIELD | Admitting: Podiatry

## 2016-06-12 DIAGNOSIS — M2021 Hallux rigidus, right foot: Secondary | ICD-10-CM | POA: Diagnosis not present

## 2016-06-12 DIAGNOSIS — Z9889 Other specified postprocedural states: Secondary | ICD-10-CM

## 2016-06-12 NOTE — Progress Notes (Signed)
Subjective:     Patient ID: [name], female   DOB: 1965/06/15, 51 y.o.   MRN: [id number]  HPI patient states her right foot continues to improve with minimal discomfort and good range of motion   Review of Systems     Objective:   Physical Exam Neurovascular status intact negative Homans sign was noted with good range of motion first MPJ with no crepitus within the joint and moderate edema in the forefoot    Assessment:     Doing well with moderate edema secondary to the patient being slightly more active than she should be with good range of motion and no crepitus of the joint surface    Plan:     X-rays reviewed compression stocking Ace wrap dispensed instructed on continued elevation immobilization and reappoint in 4 weeks  X-rays indicate the osteotomy is healing well pins in place no signs of movement with joint congruence

## 2016-06-15 MED ORDER — OXYCODONE-ACETAMINOPHEN 5-325 MG PO TABS: ORAL_TABLET | ORAL | 0 refills | Status: DC

## 2016-06-15 NOTE — Addendum Note (Signed)
Addended by: Alphia Kava D on: 06/15/2016 05:55 PM   Modules accepted: Orders

## 2016-06-16 DIAGNOSIS — M47817 Spondylosis without myelopathy or radiculopathy, lumbosacral region: Secondary | ICD-10-CM | POA: Diagnosis not present

## 2016-06-21 DIAGNOSIS — Z96652 Presence of left artificial knee joint: Secondary | ICD-10-CM | POA: Diagnosis not present

## 2016-06-21 DIAGNOSIS — Z471 Aftercare following joint replacement surgery: Secondary | ICD-10-CM | POA: Diagnosis not present

## 2016-06-23 DIAGNOSIS — M47817 Spondylosis without myelopathy or radiculopathy, lumbosacral region: Secondary | ICD-10-CM | POA: Diagnosis not present

## 2016-06-23 DIAGNOSIS — M47812 Spondylosis without myelopathy or radiculopathy, cervical region: Secondary | ICD-10-CM | POA: Diagnosis not present

## 2016-06-23 DIAGNOSIS — M961 Postlaminectomy syndrome, not elsewhere classified: Secondary | ICD-10-CM | POA: Diagnosis not present

## 2016-06-23 DIAGNOSIS — G894 Chronic pain syndrome: Secondary | ICD-10-CM | POA: Diagnosis not present

## 2016-06-25 ENCOUNTER — Emergency Department (HOSPITAL_COMMUNITY): Payer: BLUE CROSS/BLUE SHIELD

## 2016-06-25 ENCOUNTER — Encounter (HOSPITAL_COMMUNITY): Payer: Self-pay | Admitting: Emergency Medicine

## 2016-06-25 ENCOUNTER — Emergency Department (HOSPITAL_COMMUNITY)
Admission: EM | Admit: 2016-06-25 | Discharge: 2016-06-25 | Disposition: A | Discharge status: Home / Self Care | Payer: BLUE CROSS/BLUE SHIELD | Attending: Emergency Medicine | Admitting: Emergency Medicine

## 2016-06-25 DIAGNOSIS — E039 Hypothyroidism, unspecified: Secondary | ICD-10-CM | POA: Diagnosis not present

## 2016-06-25 DIAGNOSIS — Z96653 Presence of artificial knee joint, bilateral: Secondary | ICD-10-CM | POA: Diagnosis not present

## 2016-06-25 DIAGNOSIS — Z79899 Other long term (current) drug therapy: Secondary | ICD-10-CM | POA: Insufficient documentation

## 2016-06-25 DIAGNOSIS — R1031 Right lower quadrant pain: Secondary | ICD-10-CM | POA: Diagnosis present

## 2016-06-25 DIAGNOSIS — R109 Unspecified abdominal pain: Secondary | ICD-10-CM | POA: Diagnosis not present

## 2016-06-25 DIAGNOSIS — R1110 Vomiting, unspecified: Secondary | ICD-10-CM | POA: Diagnosis not present

## 2016-06-25 DIAGNOSIS — K529 Noninfective gastroenteritis and colitis, unspecified: Secondary | ICD-10-CM | POA: Insufficient documentation

## 2016-06-25 LAB — COMPREHENSIVE METABOLIC PANEL
ALBUMIN: 3.9 g/dL (ref 3.5–5.0)
ALK PHOS: 97 U/L (ref 38–126)
ALT: 18 U/L (ref 14–54)
AST: 17 U/L (ref 15–41)
Anion gap: 10 (ref 5–15)
BILIRUBIN TOTAL: 0.5 mg/dL (ref 0.3–1.2)
BUN: 16 mg/dL (ref 6–20)
CALCIUM: 9.1 mg/dL (ref 8.9–10.3)
CO2: 29 mmol/L (ref 22–32)
CREATININE: 0.82 mg/dL (ref 0.44–1.00)
Chloride: 98 mmol/L — ABNORMAL LOW (ref 101–111)
GFR calc Af Amer: >60 mL/min (ref >60)
GFR calc non Af Amer: >60 mL/min (ref >60)
GLUCOSE: 96 mg/dL (ref 65–99)
Potassium: 3.2 mmol/L — ABNORMAL LOW (ref 3.5–5.1)
SODIUM: 137 mmol/L (ref 135–145)
TOTAL PROTEIN: 7 g/dL (ref 6.5–8.1)

## 2016-06-25 LAB — LIPASE, BLOOD: Lipase: 15 U/L (ref 11–51)

## 2016-06-25 LAB — CBC
HCT: 37.9 % (ref 36.0–46.0)
HEMOGLOBIN: 12.1 g/dL (ref 12.0–15.0)
MCH: 26.3 pg (ref 26.0–34.0)
MCHC: 31.9 g/dL (ref 30.0–36.0)
MCV: 82.4 fL (ref 78.0–100.0)
Platelets: 254 10*3/uL (ref 150–400)
RBC: 4.6 MIL/uL (ref 3.87–5.11)
RDW: 14.6 % (ref 11.5–15.5)
WBC: 15.1 10*3/uL — AB (ref 4.0–10.5)

## 2016-06-25 LAB — URINALYSIS, ROUTINE W REFLEX MICROSCOPIC
BACTERIA UA: NONE SEEN
Bilirubin Urine: NEGATIVE
Glucose, UA: NEGATIVE mg/dL
Hgb urine dipstick: NEGATIVE
KETONES UR: NEGATIVE mg/dL
Nitrite: NEGATIVE
PH: 6 (ref 5.0–8.0)
PROTEIN: NEGATIVE mg/dL
Specific Gravity, Urine: 1.02 (ref 1.005–1.030)

## 2016-06-25 MED ORDER — METRONIDAZOLE 500 MG PO TABS: 500.0000 mg | ORAL_TABLET | Freq: Two times a day (BID) | ORAL | 0 refills | Status: DC

## 2016-06-25 MED ORDER — ONDANSETRON 4 MG PO TBDP: 4.0000 mg | ORAL_TABLET | Freq: Three times a day (TID) | ORAL | 0 refills | Status: DC | PRN

## 2016-06-25 MED ORDER — CIPROFLOXACIN HCL 500 MG PO TABS: 500.0000 mg | ORAL_TABLET | Freq: Two times a day (BID) | ORAL | 0 refills | Status: DC

## 2016-06-25 MED ORDER — METRONIDAZOLE IN NACL 5-0.79 MG/ML-% IV SOLN
500.0000 mg | Freq: Once | INTRAVENOUS | Status: AC
  Administered 2016-06-25: 500 mg via INTRAVENOUS
  Filled 2016-06-25: qty 100

## 2016-06-25 MED ORDER — IOPAMIDOL (ISOVUE-300) INJECTION 61%
INTRAVENOUS | Status: AC
  Administered 2016-06-25: 100 mL via INTRAVENOUS
  Filled 2016-06-25: qty 100

## 2016-06-25 MED ORDER — MORPHINE SULFATE (PF) 4 MG/ML IV SOLN
6.0000 mg | Freq: Once | INTRAVENOUS | Status: AC
  Administered 2016-06-25: 6 mg via INTRAVENOUS
  Filled 2016-06-25: qty 2

## 2016-06-25 MED ORDER — CIPROFLOXACIN IN D5W 400 MG/200ML IV SOLN
400.0000 mg | Freq: Once | INTRAVENOUS | Status: AC
  Administered 2016-06-25: 400 mg via INTRAVENOUS
  Filled 2016-06-25: qty 200

## 2016-06-25 MED ORDER — SODIUM CHLORIDE 0.9 % IV BOLUS (SEPSIS)
1000.0000 mL | Freq: Once | INTRAVENOUS | Status: AC
  Administered 2016-06-25: 1000 mL via INTRAVENOUS

## 2016-06-25 MED ORDER — ONDANSETRON HCL 4 MG/2ML IJ SOLN
4.0000 mg | Freq: Once | INTRAMUSCULAR | Status: AC
  Administered 2016-06-25: 4 mg via INTRAVENOUS
  Filled 2016-06-25: qty 2

## 2016-06-25 MED ORDER — OXYCODONE HCL 5 MG PO TABS
10.0000 mg | ORAL_TABLET | Freq: Once | ORAL | Status: AC
  Administered 2016-06-25: 10 mg via ORAL
  Filled 2016-06-25: qty 2

## 2016-06-25 NOTE — ED Notes (Signed)
Bed: WA17 Expected date:  Expected time:  Means of arrival:  Comments: [place]

## 2016-06-25 NOTE — ED Notes (Signed)
Pt still has IV ABX going and will be d/c when there are completed.

## 2016-06-25 NOTE — ED Provider Notes (Signed)
[facility]-EMERGENCY DEPT Provider Note   CSN: [id number] Arrival date & time: 06/25/16  1332     History   Chief Complaint Chief Complaint  Patient presents with  . RLQ pain  . Emesis    HPI [name] is a 51 y.o. female.  The history is provided by the patient and the spouse.    3 days of progressively worsening right lower quadrant abdominal pain associated multiple episodes of emesis (approximately 7-8 per the patient's husband report). MAXIMUM TEMPERATURE of 100.0. No sick contacts. No history of any similar. She has a history of hysterectomy and cholecystectomy. She is a decreased bowel movements over the last few days as well. No urinary symptoms. No other associated or exacerbating symptoms.  Past Medical History:  Diagnosis Date  . Anemia   . Anxiety   . Bronchitis    hx of   . Cervical pain (neck)   . Chronic pancreatitis (HCC)   . Constipation   . DDD (degenerative disc disease), cervical   . Depression   . GERD (gastroesophageal reflux disease)   . History of kidney stones   . History of pancreatitis 01-04-2011  . Hypothyroidism   . IBS (irritable bowel syndrome)   . Nausea DUE TO PAIN FROM KIDNEY STONE  . Pancreatic pseudocyst   . Pneumonia    hx of pneumonia   . PONV (postoperative nausea and vomiting)   . Right flank pain   . Right knee pain   . Right ureteral stone   . Swelling of right knee joint   . Urinary tract infection    hx of     Patient Active Problem List   Diagnosis Date Noted  . OA (osteoarthritis) of knee 12/13/2015  . Obese 09/01/2014  . 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 S/P revision of total knee 08/31/2014  . S/P right TKA 01/13/2014  . S/P knee replacement 01/13/2014  . Acute bronchitis 04/28/2013  . Chest pain 04/28/2013  . Chronic cough 02/02/2013  . Hypoxia in the setting of recent bronchitis and cough 06/07/2012  . Toxic encephalopathy 06/07/2012  . Leukocytosis 07/27/2011  . Metabolic encephalopathy 07/27/2011   . UTI (lower urinary tract infection) 07/27/2011  . Hx of pancreatitis 07/27/2011  . Anemia 07/27/2011  . Hypothyroid 07/27/2011  . Depression 07/27/2011  . GERD (gastroesophageal reflux disease) 07/27/2011  . Pancreatic pseudocyst 07/27/2011    Past Surgical History:  Procedure Laterality Date  . ABDOMINAL HYSTERECTOMY    . ANTERIOR LUMBAR FUSION  11-09-2010    L5 - S1  . BACK SURGERY  2012   lower back surgery   . CERVICAL FUSION  1999   C4 - 7  . CERVICAL SPINE SURGERY  APRIL 2011   REMOVAL SCAR TISSUE  . colonscopy     . CYSTO/ RIGHT RETROGRADE PYELOGRAM/ RIGHT URETEROSCOPY AND STENT PLACEMENT  09-24-2009   HX RIGHT URETERAL CALCULUS  . CYSTOSCOPY WITH URETEROSCOPY  12/20/2011   Procedure: CYSTOSCOPY WITH URETEROSCOPY;  Surgeon: Milford Cage, MD;  Location: [facility];  Service: Urology;  Laterality: Right;  . DIAGNOSTIC LAPAROSCOPY  1992   ENDOMETRIOSIS  . EUS  03/01/2011   Procedure: UPPER ENDOSCOPIC ULTRASOUND (EUS) LINEAR;  Surgeon: Freddy Jaksch, MD;  Location: [facility];  Service: Endoscopy;  Laterality: N/A;  . FINE NEEDLE ASPIRATION  03/01/2011   Procedure: FINE NEEDLE ASPIRATION (FNA) LINEAR;  Surgeon: Freddy Jaksch, MD;  Location: [facility];  Service: Endoscopy;  Laterality: N/A;  .  HERNIA REPAIR  AS CHILD  . JOINT REPLACEMENT    . KNEE ARTHROSCOPY  AUG 2013   RIGHT KNEE  . LAPAROSCOPIC ASSISTED VAGINAL HYSTERECTOMY  1997  . LAPAROSCOPIC CHOLECYSTECTOMY  01-09-2011  . ROBOTIC-ASSISTED RIGHT SALPINGO-OOPHORECTOMY W/ LYSIS ADHESIONS  12-21-2009   HX ENDOMETRIOSIS AND PELVIC PAIN  . TONSILLECTOMY  AS CHILD  . TOTAL KNEE ARTHROPLASTY Right 01/13/2014   Procedure: RIGHT TOTAL KNEE ARTHROPLASTY;  Surgeon: Shelda Pal, MD;  Location: [facility];  Service: Orthopedics;  Laterality: Right;  . TOTAL KNEE ARTHROPLASTY Left 12/13/2015   Procedure: LEFT TOTAL KNEE ARTHROPLASTY;  Surgeon: Ollen Gross, MD;  Location: [facility];  Service:  Orthopedics;  Laterality: Left;  . TOTAL KNEE REVISION Right 08/31/2014   Procedure: RIGHT TOTAL KNEE ARTHROPLASTY RESECTION;  Surgeon: Durene Romans, MD;  Location: [facility];  Service: Orthopedics;  Laterality: Right;  . URETEROSCOPIC STONE EXTRACTIONS     X3    OB History    No data available       Home Medications    Prior to Admission medications   Medication Sig Start Date End Date Taking? Authorizing Provider  albuterol (PROVENTIL HFA;VENTOLIN HFA) 108 (90 BASE) MCG/ACT inhaler Inhale 2 puffs into the lungs every 6 (six) hours as needed for wheezing.   Yes Historical Provider, MD  ALPRAZolam Prudy Feeler) 1 MG tablet Take 1 mg by mouth 3 (three) times daily.    Yes Historical Provider, MD  BELBUCA 150 MCG FILM Take 150 mcg by mouth 2 (two) times daily. 05/26/16  Yes Historical Provider, MD  buPROPion (WELLBUTRIN XL) 300 MG 24 hr tablet Take 300 mg by mouth every morning.  01/05/11  Yes Historical Provider, MD  docusate sodium (COLACE) 100 MG capsule Take 1 capsule (100 mg total) by mouth 2 (two) times daily. Patient taking differently: Take 200 mg by mouth at bedtime as needed for moderate constipation.  09/01/14  Yes Lanney Gins, PA-C  DULoxetine (CYMBALTA) 60 MG capsule Take 60 mg by mouth 2 (two) times daily.   Yes Historical Provider, MD  hydrochlorothiazide (HYDRODIURIL) 25 MG tablet Take 25 mg by mouth daily with lunch.  10/30/10  Yes Historical Provider, MD  hydrocortisone-pramoxine (PROCTOFOAM-HC) rectal foam Place 1 applicator rectally 2 (two) times daily as needed for hemorrhoids.   Yes Historical Provider, MD  levothyroxine (SYNTHROID, LEVOTHROID) 125 MCG tablet Take 125 mcg by mouth daily before breakfast.   Yes Historical Provider, MD  Meth-Hyo-M Bl-Na Phos-Ph Sal (URIBEL) 118 MG CAPS Take 118 mg by mouth 3 (three) times daily. 05/16/16  Yes Historical Provider, MD  methocarbamol (ROBAXIN) 500 MG tablet Take 1 tablet (500 mg total) by mouth every 6 (six) hours as needed for muscle  spasms. 12/14/15  Yes Alexzandrew L Perkins, PA-C  nitrofurantoin, macrocrystal-monohydrate, (MACROBID) 100 MG capsule Take 100 mg by mouth daily.   Yes Historical Provider, MD  omeprazole (PRILOSEC) 20 MG capsule Take 20 mg by mouth daily with lunch.    Yes Historical Provider, MD  oxyCODONE 10 MG TABS Take 1-2 tablets (10-20 mg total) by mouth every 3 (three) hours as needed for moderate pain or severe pain. Patient taking differently: Take 10-20 mg by mouth 3 (three) times daily.  12/14/15  Yes Alexzandrew L Perkins, PA-C  pentosan polysulfate (ELMIRON) 100 MG capsule Take 100 mg by mouth 3 (three) times daily.   Yes Historical Provider, MD  polycarbophil (FIBERCON) 625 MG tablet Take 1,250 mg by mouth 2 (two) times daily.   Yes Historical Provider,  MD  polyethylene glycol (MIRALAX / GLYCOLAX) packet Take 17 g by mouth daily as needed for moderate constipation.    Yes Historical Provider, MD  pregabalin (LYRICA) 300 MG capsule Take 300 mg by mouth 2 (two) times daily.   Yes Historical Provider, MD  promethazine (PHENERGAN) 25 MG tablet Take 1 tablet (25 mg total) by mouth 3 (three) times daily as needed for nausea. Patient taking differently: Take 25 mg by mouth every 6 (six) hours as needed for nausea.  09/01/14  Yes Lanney Gins, PA-C  sodium chloride (OCEAN) 0.65 % SOLN nasal spray Place 1 spray into both nostrils daily as needed for congestion. After flonase.   Yes Historical Provider, MD  Tamsulosin HCl (FLOMAX) 0.4 MG CAPS Take 0.4 mg by mouth daily with lunch.    Yes Historical Provider, MD  ciprofloxacin (CIPRO) 500 MG tablet Take 1 tablet (500 mg total) by mouth 2 (two) times daily. One po bid x 7 days 06/25/16   Marily Memos, MD  metroNIDAZOLE (FLAGYL) 500 MG tablet Take 1 tablet (500 mg total) by mouth 2 (two) times daily. One po bid x 7 days 06/25/16   Marily Memos, MD  ondansetron (ZOFRAN ODT) 4 MG disintegrating tablet Take 1 tablet (4 mg total) by mouth every 8 (eight) hours as needed  for nausea or vomiting. 06/25/16   Marily Memos, MD    Family History Family History  Problem Relation Age of Onset  . Cancer Father   . Fibromyalgia Sister     Social History Social History  Substance Use Topics  . Smoking status: Never Smoker  . Smokeless tobacco: Never Used  . Alcohol use No     Allergies   Deltasone [prednisone]; Solu-medrol [methylprednisolone]; Dilaudid [hydromorphone hcl]; and Zolpidem tartrate   Review of Systems Review of Systems  All other systems reviewed and are negative.    Physical Exam Updated Vital Signs BP 99/61   Pulse 100   Temp 98.5 F (36.9 C) (Oral)   Resp (!) 29   Ht 5' 4.5" (1.638 m)   Wt 225 lb (102.1 kg)   SpO2 95%   BMI 38.02 kg/m   Physical Exam  Constitutional: She is oriented to person, place, and time. She appears well-developed and well-nourished.  HENT:  Head: Normocephalic and atraumatic.  Eyes: Conjunctivae and EOM are normal.  Neck: Normal range of motion.  Cardiovascular: Normal rate and regular rhythm.   No murmur heard. Pulmonary/Chest: Effort normal and breath sounds normal. No stridor. No respiratory distress.  Abdominal: Soft. She exhibits no distension. There is tenderness.  Musculoskeletal: Normal range of motion. She exhibits no edema or deformity.  Neurological: She is alert and oriented to person, place, and time. No cranial nerve deficit. Coordination normal.  Skin: Skin is warm and dry. No erythema. No pallor.  Nursing note and vitals reviewed.    ED Treatments / Results  Labs (all labs ordered are listed, but only abnormal results are displayed) Labs Reviewed  COMPREHENSIVE METABOLIC PANEL - Abnormal; Notable for the following:       Result Value   Potassium 3.2 (*)    Chloride 98 (*)    All other components within normal limits  URINALYSIS, ROUTINE W REFLEX MICROSCOPIC - Abnormal; Notable for the following:    Color, Urine GREEN (*)    Leukocytes, UA TRACE (*)    Squamous  Epithelial / LPF 0-5 (*)    All other components within normal limits  CBC - Abnormal; Notable for  the following:    WBC 15.1 (*)    All other components within normal limits  LIPASE, BLOOD    EKG  EKG Interpretation None       Radiology Ct Abdomen Pelvis W Contrast  Result Date: 06/25/2016 CLINICAL DATA:  Right side abdominal pain, vomiting starting yesterday, possible appendicitis, history of inflammatory bowel disease EXAM: CT ABDOMEN AND PELVIS WITH CONTRAST TECHNIQUE: Multidetector CT imaging of the abdomen and pelvis was performed using the standard protocol following bolus administration of intravenous contrast. CONTRAST:  ISOVUE-300 IOPAMIDOL (ISOVUE-300) INJECTION 61% COMPARISON:  12/14/2014 FINDINGS: Lower chest: Lung bases shows no acute findings Hepatobiliary: No focal hepatic mass. Stable probable cyst in lateral segment of left hepatic lobe measures 5 mm Pancreas: Unremarkable. No pancreatic ductal dilatation or surrounding inflammatory changes. Spleen:  Enhanced spleen shows no focal abnormality. Adrenals/Urinary Tract: No adrenal gland mass. Enhanced kidneys are symmetrical in size. No hydronephrosis or hydroureter. No calcified ureteral calculi. Bilateral visualized ureter is unremarkable. No calcified calculi are noted within under distended urinary bladder. Delayed renal images shows bilateral renal symmetrical excretion. Bilateral visualized proximal ureter is unremarkable. Stomach/Bowel: There is no small bowel obstruction. No gastric outlet obstruction. No thickened or dilated small bowel loops. No pericecal inflammation. Normal appendix is partially visualized in axial image 67. There is abnormal segmental thickening of colonic wall in right colon above the ileocecal bowel. Wall thickness measures up to 1.1 cm. There is mild stranding of surrounding fat. Findings are consistent with segmental colitis. This segment measures at least 10 cm in length. Please see coronal  image 62. Infectious colitis or inflammatory bowel disease cannot be excluded. Less likely neoplastic or ischemic process. Moderate stool and some colonic gas noted in transverse colon. Moderate stool noted within descending colon. Some colonic stool and gas noted within redundant sigmoid colon. No distal colonic obstruction. No distal colitis or diverticulitis. Vascular/Lymphatic: Small nonspecific lymph nodes are noted in right lower quadrant mesentery the largest measures 1 cm borderline probable reactive. No aortic aneurysm. No retroperitoneal adenopathy. Reproductive: The patient is status post hysterectomy no pelvic mass or adenopathy. Other: No free abdominal air. No inguinal adenopathy. There is a small umbilical and supraumbilical hernia containing fat measures at least 2 cm. There is no evidence of acute complication. Musculoskeletal: Sagittal images of the spine shows degenerative changes lower thoracic spine. Postsurgical changes are noted lumbar spine at L5 S1 level. Significant disc space flattening with endplate sclerotic changes vacuum disc phenomenon anterior and posterior spurring at L3-L4 level. IMPRESSION: 1. There is segmental thickening of colonic wall and enhancement of mucosa with mild stranding of pericolonic fat in right colon above the ileocecal valve. Findings are consistent with segmental colitis. This may be due to infectious colitis or inflammatory bowel disease. Less likely neoplastic or ischemic colitis. Clinical correlation is necessary. 2. No pericecal inflammation. Normal appendix is partially visualized. No small bowel obstruction. Unremarkable terminal ileum. 3. No hydronephrosis or hydroureter. 4. Degenerative changes thoracolumbar spine. 5. Status post hysterectomy. Electronically Signed   By: Natasha Mead M.D.   On: 06/25/2016 18:08    Procedures Procedures (including critical care time)  Medications Ordered in ED Medications  metroNIDAZOLE (FLAGYL) IVPB 500 mg (500 mg  Intravenous New Bag/Given 06/25/16 1927)  oxyCODONE (Oxy IR/ROXICODONE) immediate release tablet 10 mg (not administered)  ondansetron (ZOFRAN) injection 4 mg (4 mg Intravenous Given 06/25/16 1731)  sodium chloride 0.9 % bolus 1,000 mL (0 mLs Intravenous Stopped 06/25/16 1835)  morphine 4 MG/ML injection  6 mg (6 mg Intravenous Given 06/25/16 1731)  iopamidol (ISOVUE-300) 61 % injection (100 mLs Intravenous Contrast Given 06/25/16 1715)  ciprofloxacin (CIPRO) IVPB 400 mg (0 mg Intravenous Stopped 06/25/16 1948)  sodium chloride 0.9 % bolus 1,000 mL (1,000 mLs Intravenous New Bag/Given 06/25/16 1846)  morphine 4 MG/ML injection 6 mg (6 mg Intravenous Given 06/25/16 1842)     Initial Impression / Assessment and Plan / ED Course  I have reviewed the triage vital signs and the nursing notes.  Pertinent labs & imaging results that were available during my care of the patient were reviewed by me and considered in my medical decision making (see chart for details).     Workup consistent with simple colitis. Abdomen is nonsurgical. Pain is improved with medications here. Started on Cipro Flagyl. Plan for discharge on Cipro Flagyl and patient has pain medicine at home she'll discuss with her pain management clinic about increasing the dosage. Otherwise follow with primary doctor in a few days to ensure symptoms improving.  Final Clinical Impressions(s) / ED Diagnoses   Final diagnoses:  Colitis    New Prescriptions New Prescriptions   CIPROFLOXACIN (CIPRO) 500 MG TABLET    Take 1 tablet (500 mg total) by mouth 2 (two) times daily. One po bid x 7 days   METRONIDAZOLE (FLAGYL) 500 MG TABLET    Take 1 tablet (500 mg total) by mouth 2 (two) times daily. One po bid x 7 days   ONDANSETRON (ZOFRAN ODT) 4 MG DISINTEGRATING TABLET    Take 1 tablet (4 mg total) by mouth every 8 (eight) hours as needed for nausea or vomiting.     Marily Memos, MD 06/25/16 2014

## 2016-06-25 NOTE — ED Triage Notes (Signed)
patient c/o right sided abd pain with vomiting that started yesterday. patient states that she went to urgent care and was sent here to rule out appendicitis.  Patient has PMH kidney stones but states that this pain is worse and differ than her other kidney stones.  Patient denies urinary problems but states that she has IBS.

## 2016-06-29 ENCOUNTER — Other Ambulatory Visit: Payer: Self-pay | Admitting: Gastroenterology

## 2016-06-29 ENCOUNTER — Ambulatory Visit
Admission: RE | Admit: 2016-06-29 | Discharge: 2016-06-29 | Disposition: A | Discharge status: Home / Self Care | Payer: BLUE CROSS/BLUE SHIELD | Source: Ambulatory Visit | Attending: Gastroenterology | Admitting: Gastroenterology

## 2016-06-29 DIAGNOSIS — R109 Unspecified abdominal pain: Secondary | ICD-10-CM

## 2016-06-29 DIAGNOSIS — K5900 Constipation, unspecified: Secondary | ICD-10-CM

## 2016-07-01 DIAGNOSIS — F3342 Major depressive disorder, recurrent, in full remission: Secondary | ICD-10-CM | POA: Diagnosis not present

## 2016-07-10 ENCOUNTER — Ambulatory Visit: Payer: BLUE CROSS/BLUE SHIELD

## 2016-07-14 ENCOUNTER — Ambulatory Visit (INDEPENDENT_AMBULATORY_CARE_PROVIDER_SITE_OTHER): Payer: Self-pay | Admitting: Podiatry

## 2016-07-14 ENCOUNTER — Ambulatory Visit (INDEPENDENT_AMBULATORY_CARE_PROVIDER_SITE_OTHER): Payer: BLUE CROSS/BLUE SHIELD

## 2016-07-14 DIAGNOSIS — M2021 Hallux rigidus, right foot: Secondary | ICD-10-CM

## 2016-07-14 DIAGNOSIS — Z9889 Other specified postprocedural states: Secondary | ICD-10-CM

## 2016-07-15 NOTE — Progress Notes (Signed)
Subjective:    Patient ID: [name], female   DOB: 51 y.o.   MRN: [id number]   HPI patient presents stating he's doing better with the right foot and states that she's getting mild discomfort but not severe and is walking more now    ROS      Objective:  Physical Exam Neurovascular status intact negative Homans sign was noted with patient's right first MPJ healing well with wound edges well coapted and good range of motion first MPJ with minimal discomfort noted    Assessment:    Doing well post osteotomy first metatarsal right with excellent range of motion     Plan:    H&P conditions reviewed and x-rays reviewed and today I recommended gradual increase in activities dispensed anklet for compression with continued elevation and reappoint 6 weeks or earlier if any problems should occur  X-rays indicate osteotomy is healing well pins in place joint congruence no spurring noted

## 2016-07-21 DIAGNOSIS — G894 Chronic pain syndrome: Secondary | ICD-10-CM | POA: Diagnosis not present

## 2016-07-21 DIAGNOSIS — M961 Postlaminectomy syndrome, not elsewhere classified: Secondary | ICD-10-CM | POA: Diagnosis not present

## 2016-07-21 DIAGNOSIS — M47817 Spondylosis without myelopathy or radiculopathy, lumbosacral region: Secondary | ICD-10-CM | POA: Diagnosis not present

## 2016-07-21 DIAGNOSIS — M47812 Spondylosis without myelopathy or radiculopathy, cervical region: Secondary | ICD-10-CM | POA: Diagnosis not present

## 2016-07-25 DIAGNOSIS — Z8719 Personal history of other diseases of the digestive system: Secondary | ICD-10-CM | POA: Diagnosis not present

## 2016-07-25 DIAGNOSIS — R1030 Lower abdominal pain, unspecified: Secondary | ICD-10-CM | POA: Diagnosis not present

## 2016-07-25 DIAGNOSIS — R109 Unspecified abdominal pain: Secondary | ICD-10-CM | POA: Diagnosis not present

## 2016-07-25 DIAGNOSIS — N200 Calculus of kidney: Secondary | ICD-10-CM | POA: Diagnosis not present

## 2016-07-26 DIAGNOSIS — R1084 Generalized abdominal pain: Secondary | ICD-10-CM | POA: Diagnosis not present

## 2016-07-26 DIAGNOSIS — K5900 Constipation, unspecified: Secondary | ICD-10-CM | POA: Diagnosis not present

## 2016-07-26 DIAGNOSIS — R935 Abnormal findings on diagnostic imaging of other abdominal regions, including retroperitoneum: Secondary | ICD-10-CM | POA: Diagnosis not present

## 2016-07-26 DIAGNOSIS — D72829 Elevated white blood cell count, unspecified: Secondary | ICD-10-CM | POA: Diagnosis not present

## 2016-07-28 DIAGNOSIS — D649 Anemia, unspecified: Secondary | ICD-10-CM | POA: Diagnosis not present

## 2016-07-31 DIAGNOSIS — M47817 Spondylosis without myelopathy or radiculopathy, lumbosacral region: Secondary | ICD-10-CM | POA: Diagnosis not present

## 2016-08-04 DIAGNOSIS — D175 Benign lipomatous neoplasm of intra-abdominal organs: Secondary | ICD-10-CM | POA: Diagnosis not present

## 2016-08-04 DIAGNOSIS — K529 Noninfective gastroenteritis and colitis, unspecified: Secondary | ICD-10-CM | POA: Diagnosis not present

## 2016-08-04 DIAGNOSIS — R933 Abnormal findings on diagnostic imaging of other parts of digestive tract: Secondary | ICD-10-CM | POA: Diagnosis not present

## 2016-08-04 DIAGNOSIS — K5289 Other specified noninfective gastroenteritis and colitis: Secondary | ICD-10-CM | POA: Diagnosis not present

## 2016-08-11 DIAGNOSIS — K529 Noninfective gastroenteritis and colitis, unspecified: Secondary | ICD-10-CM | POA: Diagnosis not present

## 2016-08-17 NOTE — Progress Notes (Signed)
1. Bi-Planar osteotomy with fixation 1st right

## 2016-08-18 DIAGNOSIS — M47812 Spondylosis without myelopathy or radiculopathy, cervical region: Secondary | ICD-10-CM | POA: Diagnosis not present

## 2016-08-18 DIAGNOSIS — G894 Chronic pain syndrome: Secondary | ICD-10-CM | POA: Diagnosis not present

## 2016-08-18 DIAGNOSIS — M961 Postlaminectomy syndrome, not elsewhere classified: Secondary | ICD-10-CM | POA: Diagnosis not present

## 2016-08-18 DIAGNOSIS — M47817 Spondylosis without myelopathy or radiculopathy, lumbosacral region: Secondary | ICD-10-CM | POA: Diagnosis not present

## 2016-08-23 ENCOUNTER — Other Ambulatory Visit: Payer: Self-pay | Admitting: Gastroenterology

## 2016-08-25 ENCOUNTER — Ambulatory Visit: Payer: BLUE CROSS/BLUE SHIELD

## 2016-09-01 ENCOUNTER — Ambulatory Visit (INDEPENDENT_AMBULATORY_CARE_PROVIDER_SITE_OTHER): Payer: BLUE CROSS/BLUE SHIELD | Admitting: Podiatry

## 2016-09-01 ENCOUNTER — Ambulatory Visit (INDEPENDENT_AMBULATORY_CARE_PROVIDER_SITE_OTHER): Payer: BLUE CROSS/BLUE SHIELD

## 2016-09-01 DIAGNOSIS — Z9889 Other specified postprocedural states: Secondary | ICD-10-CM

## 2016-09-01 DIAGNOSIS — M2021 Hallux rigidus, right foot: Secondary | ICD-10-CM

## 2016-09-04 ENCOUNTER — Ambulatory Visit (HOSPITAL_COMMUNITY)
Admission: RE | Admit: 2016-09-04 | Discharge: 2016-09-04 | Disposition: A | Discharge status: Home / Self Care | Payer: BLUE CROSS/BLUE SHIELD | Source: Ambulatory Visit | Attending: Gastroenterology | Admitting: Gastroenterology

## 2016-09-04 ENCOUNTER — Encounter (HOSPITAL_COMMUNITY)
Admission: RE | Disposition: A | Discharge status: Home / Self Care | Payer: Self-pay | Source: Ambulatory Visit | Attending: Gastroenterology

## 2016-09-04 DIAGNOSIS — K5900 Constipation, unspecified: Secondary | ICD-10-CM | POA: Insufficient documentation

## 2016-09-04 HISTORY — PX: ANAL RECTAL MANOMETRY: SHX6358

## 2016-09-04 SURGERY — MANOMETRY, ANORECTAL

## 2016-09-04 NOTE — Progress Notes (Signed)
Subjective:    Patient ID: [name], female   DOB: 51 y.o.   MRN: [id number]   HPI patient states she's doing real well with her foot and is able to walk without pain at this time    ROS      Objective:  Physical Exam neurovascular status intact negative Homans sign noted with right foot healing well with osteotomy and good alignment good positional component and no pain upon movement     Assessment:    Doing well post osteotomy right     Plan:    H&P conditions reviewed and recommended continued elevation compression and reappoint for us to recheck again in the next 8 weeks.  X-ray indicates osteotomy is healing well with joint open

## 2016-09-04 NOTE — Progress Notes (Signed)
Anal Manometry done per protocol. Pt tolerated well without complication. Report to be sent to Dr. Maisie Fushomas to read.

## 2016-09-05 ENCOUNTER — Encounter (HOSPITAL_COMMUNITY): Payer: Self-pay | Admitting: Gastroenterology

## 2016-09-08 DIAGNOSIS — K5900 Constipation, unspecified: Secondary | ICD-10-CM | POA: Diagnosis not present

## 2016-10-11 ENCOUNTER — Telehealth: Payer: Self-pay | Admitting: Podiatry

## 2016-10-11 NOTE — Telephone Encounter (Signed)
I'm a patient of Dr. Beverlee Nimsegal's. I have been having a problem with one of my big toes. I've tried ointment and everything on it but it just busts open and bleeds when I'm walking or anything. I've been trying to wear tennis shoes because of the way my feet and ankles are. I didn't know if there was anything he could recommend I could use to help. You can reach me at [phone].

## 2016-10-11 NOTE — Telephone Encounter (Signed)
I told pt that possibly her skin was getting too wet in the athletic shoes or even in sandals and then getting super dry and cracking. I told pt not to put lotion on her feet and then put on shoes, and to make sure she wore all cotton sock and to put lotion on at bedtime. I told pt that in cases, she may have a fungus and should make an appt, pt agreed and I transferred to schedulers.

## 2016-10-12 ENCOUNTER — Ambulatory Visit: Payer: BLUE CROSS/BLUE SHIELD | Admitting: Podiatry

## 2016-10-13 DIAGNOSIS — G894 Chronic pain syndrome: Secondary | ICD-10-CM | POA: Diagnosis not present

## 2016-10-13 DIAGNOSIS — M961 Postlaminectomy syndrome, not elsewhere classified: Secondary | ICD-10-CM | POA: Diagnosis not present

## 2016-10-13 DIAGNOSIS — M47817 Spondylosis without myelopathy or radiculopathy, lumbosacral region: Secondary | ICD-10-CM | POA: Diagnosis not present

## 2016-10-13 DIAGNOSIS — M47812 Spondylosis without myelopathy or radiculopathy, cervical region: Secondary | ICD-10-CM | POA: Diagnosis not present

## 2016-10-18 ENCOUNTER — Ambulatory Visit (INDEPENDENT_AMBULATORY_CARE_PROVIDER_SITE_OTHER): Payer: BLUE CROSS/BLUE SHIELD | Admitting: Podiatry

## 2016-10-18 DIAGNOSIS — M79676 Pain in unspecified toe(s): Secondary | ICD-10-CM | POA: Diagnosis not present

## 2016-10-18 DIAGNOSIS — Z9889 Other specified postprocedural states: Secondary | ICD-10-CM | POA: Diagnosis not present

## 2016-10-19 NOTE — Progress Notes (Signed)
Subjective:    Patient ID: [name], female   DOB: 51 y.o.   MRN: [id number]   HPI patient presents with a split toe right and just wanted to make sure there was no issues with this or infection    ROS      Objective:  Physical Exam neurovascular status intact with patient doing well postoperatively with good alignment of the first MPJ good range of motion with no crepitus and splitting of the digit     Assessment:  Digital splitting secondary to dry scan       Plan:     Explained dermatitis condition and we'll start read by did term and Vaseline and will be seen back if any redness drainage or other pathology should occur

## 2016-11-09 DIAGNOSIS — K5900 Constipation, unspecified: Secondary | ICD-10-CM | POA: Diagnosis not present

## 2016-11-11 DIAGNOSIS — H1033 Unspecified acute conjunctivitis, bilateral: Secondary | ICD-10-CM | POA: Diagnosis not present

## 2016-11-14 DIAGNOSIS — H1033 Unspecified acute conjunctivitis, bilateral: Secondary | ICD-10-CM | POA: Diagnosis not present

## 2016-11-22 DIAGNOSIS — H11823 Conjunctivochalasis, bilateral: Secondary | ICD-10-CM | POA: Diagnosis not present

## 2016-12-08 DIAGNOSIS — M47812 Spondylosis without myelopathy or radiculopathy, cervical region: Secondary | ICD-10-CM | POA: Diagnosis not present

## 2016-12-08 DIAGNOSIS — M47817 Spondylosis without myelopathy or radiculopathy, lumbosacral region: Secondary | ICD-10-CM | POA: Diagnosis not present

## 2016-12-08 DIAGNOSIS — Z79891 Long term (current) use of opiate analgesic: Secondary | ICD-10-CM | POA: Diagnosis not present

## 2016-12-08 DIAGNOSIS — G894 Chronic pain syndrome: Secondary | ICD-10-CM | POA: Diagnosis not present

## 2016-12-08 DIAGNOSIS — M961 Postlaminectomy syndrome, not elsewhere classified: Secondary | ICD-10-CM | POA: Diagnosis not present

## 2016-12-20 IMAGING — NM NM BONE 3 PHASE
9 series · 19 of 19 positions shown · non-contrast
Comparison: None.

CLINICAL DATA: Right knee pain for 1 month follow-up total right
knee replacement 5 months prior. Left knee pain for 6 months.

EXAM:
NUCLEAR MEDICINE 3-PHASE BONE SCAN
TECHNIQUE: Radionuclide angiographic images, immediate static blood pool
images, and 3-hour delayed static images were obtained of the knees
after intravenous injection of radiopharmaceutical.
RADIOPHARMACEUTICALS:  Twenty-six Oechnetium-11m MDP IV

[Series 1: flow · 4.14mm/px · 6 of 40 frames shown (1 of 2)]
[frame 4/40  full-range]
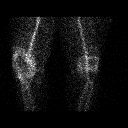
[frame 10/40  full-range]
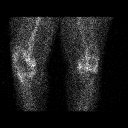
[frame 17/40  full-range]
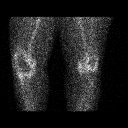
[frame 24/40  full-range]
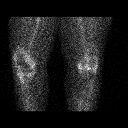
[frame 30/40  full-range]
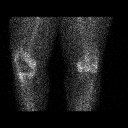
[frame 37/40  full-range]
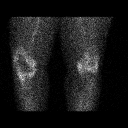

[Series 1: flow · 4.14mm/px · 6 of 40 frames shown (2 of 2)]
[frame 4/40  full-range]
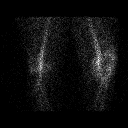
[frame 10/40  full-range]
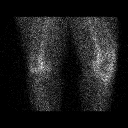
[frame 17/40  full-range]
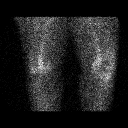
[frame 24/40  full-range]
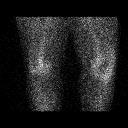
[frame 30/40  full-range]
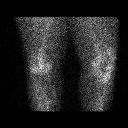
[frame 37/40  full-range]
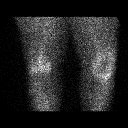

[Series 2: wbr_bone_60 blood pool · 2.07mm/px · 1 of 1 slices shown (1 of 4)]
[im 1/1]
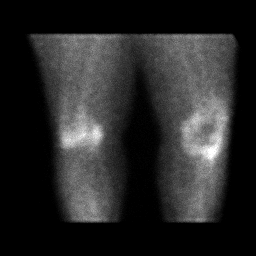

[Series 2: wbr_bone_60 blood pool · 2.07mm/px · 1 of 1 slices shown (2 of 4)]
[im 1/1]
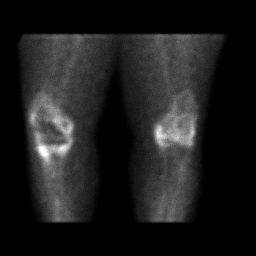

[Series 3: wbr_bone_60 blood pool · 2.07mm/px · 1 of 1 slices shown (3 of 4)]
[im 1/1]
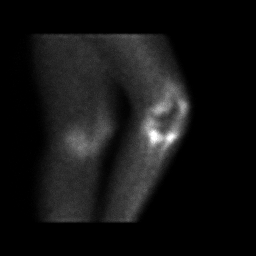

[Series 3: wbr_bone_60 blood pool · 2.07mm/px · 1 of 1 slices shown (4 of 4)]
[im 1/1]
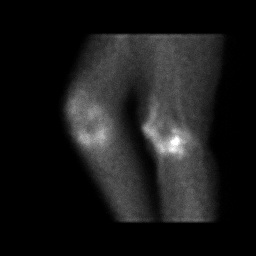

[Series 4: wbr_bone_60 delays · 2.07mm/px · 1 of 1 slices shown (1 of 3)]
[im 1/1]
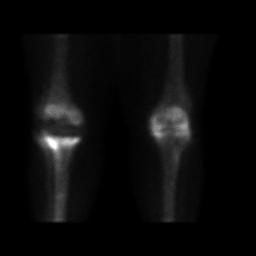

[Series 5: wbr_bone_60 delays · 2.07mm/px · 1 of 1 slices shown (2 of 3)]
[im 1/1]
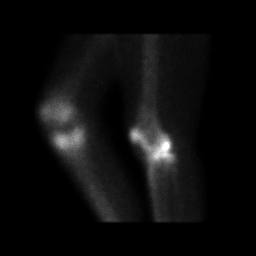

[Series 5: wbr_bone_60 delays · 2.07mm/px · 1 of 1 slices shown (3 of 3)]
[im 1/1]
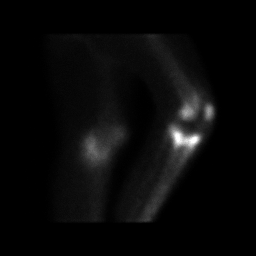

[19 of 19 positions shown; findings below may reference images not displayed]

FINDINGS: Vascular phase: There is increased blood flow to the right knee of
above and below the knee joint. Mild increased blood flow to the
left knee joint additionally.

Blood pool phase: There is fairly intense uptake within the right
knee associated with the joint capsule and femoral condyles above
the knee joint and tibial plateau beneath the knee joint. Within the
left knee abnormal uptake at the femoral condyles and the the
suprapatellar bursa.

Delayed phase: The most intense uptake in the right knee on the
delayed phases iswithin the tibial plateau with focality in the
lateral tibial plateau.

Moderate uptake within the left knee at the femoral condyles and
tibial plateau.
IMPRESSION: 1. Abnormal uptake within the right knee on all 3 phases of the bone
scan is consistent loosening or infection. Most intense uptake in
the delayed imaging is in the tibial plateau.
2. Abnormal uptake within the left knee suggests a combination of
bursitis and osteoarthritis.

## 2016-12-22 ENCOUNTER — Other Ambulatory Visit: Payer: Self-pay | Admitting: Family Medicine

## 2016-12-22 DIAGNOSIS — Z1231 Encounter for screening mammogram for malignant neoplasm of breast: Secondary | ICD-10-CM

## 2016-12-27 ENCOUNTER — Ambulatory Visit
Admission: RE | Admit: 2016-12-27 | Discharge: 2016-12-27 | Disposition: A | Discharge status: Home / Self Care | Payer: BLUE CROSS/BLUE SHIELD | Source: Ambulatory Visit | Attending: Family Medicine | Admitting: Family Medicine

## 2016-12-27 DIAGNOSIS — Z1231 Encounter for screening mammogram for malignant neoplasm of breast: Secondary | ICD-10-CM

## 2016-12-27 DIAGNOSIS — F329 Major depressive disorder, single episode, unspecified: Secondary | ICD-10-CM | POA: Diagnosis not present

## 2016-12-27 DIAGNOSIS — E039 Hypothyroidism, unspecified: Secondary | ICD-10-CM | POA: Diagnosis not present

## 2016-12-27 DIAGNOSIS — E559 Vitamin D deficiency, unspecified: Secondary | ICD-10-CM | POA: Diagnosis not present

## 2016-12-27 DIAGNOSIS — E782 Mixed hyperlipidemia: Secondary | ICD-10-CM | POA: Diagnosis not present

## 2016-12-28 DIAGNOSIS — B9789 Other viral agents as the cause of diseases classified elsewhere: Secondary | ICD-10-CM | POA: Diagnosis not present

## 2016-12-28 DIAGNOSIS — J209 Acute bronchitis, unspecified: Secondary | ICD-10-CM | POA: Diagnosis not present

## 2016-12-30 DIAGNOSIS — F3342 Major depressive disorder, recurrent, in full remission: Secondary | ICD-10-CM | POA: Diagnosis not present

## 2016-12-30 DIAGNOSIS — F410 Panic disorder [episodic paroxysmal anxiety] without agoraphobia: Secondary | ICD-10-CM | POA: Diagnosis not present

## 2017-01-11 DIAGNOSIS — E669 Obesity, unspecified: Secondary | ICD-10-CM | POA: Diagnosis not present

## 2017-01-11 DIAGNOSIS — E039 Hypothyroidism, unspecified: Secondary | ICD-10-CM | POA: Diagnosis not present

## 2017-01-11 DIAGNOSIS — K861 Other chronic pancreatitis: Secondary | ICD-10-CM | POA: Diagnosis not present

## 2017-01-11 DIAGNOSIS — R7989 Other specified abnormal findings of blood chemistry: Secondary | ICD-10-CM | POA: Diagnosis not present

## 2017-01-25 ENCOUNTER — Ambulatory Visit: Payer: BLUE CROSS/BLUE SHIELD | Attending: Gastroenterology | Admitting: Physical Therapy

## 2017-01-25 ENCOUNTER — Encounter: Payer: Self-pay | Admitting: Physical Therapy

## 2017-01-25 DIAGNOSIS — M6281 Muscle weakness (generalized): Secondary | ICD-10-CM | POA: Diagnosis not present

## 2017-01-25 DIAGNOSIS — R278 Other lack of coordination: Secondary | ICD-10-CM | POA: Insufficient documentation

## 2017-01-25 DIAGNOSIS — R252 Cramp and spasm: Secondary | ICD-10-CM | POA: Diagnosis not present

## 2017-01-25 NOTE — Patient Instructions (Signed)
Squatty potty  Toileting Techniques for Bowel Movements (Defecation) Using your belly (abdomen) and pelvic floor muscles to have a bowel movement is usually instinctive.  Sometimes people can have problems with these muscles and have to relearn proper defecation (emptying) techniques.  If you have weakness in your muscles, organs that are falling out, decreased sensation in your pelvis, or ignore your urge to go, you may find yourself straining to have a bowel movement.  You are straining if you are: . holding your breath or taking in a huge gulp of air and holding it  . keeping your lips and jaw tensed and closed tightly . turning red in the face because of excessive pushing or forcing . developing or worsening your  hemorrhoids . getting faint while pushing . not emptying completely and have to defecate many times a day  If you are straining, you are actually making it harder for yourself to have a bowel movement.  Many people find they are pulling up with the pelvic floor muscles and closing off instead of opening the anus. Due to lack pelvic floor relaxation and coordination the abdominal muscles, one has to work harder to push the feces out.  Many people have never been taught how to defecate efficiently and effectively.  Notice what happens to your body when you are having a bowel movement.  While you are sitting on the toilet pay attention to the following areas: . Jaw and mouth position . Angle of your hips   . Whether your feet touch the ground or not . Arm placement  . Spine position . Waist . Belly tension . Anus (opening of the anal canal)  An Evacuation/Defecation Plan   Here are the 4 basic points:  1. Lean forward enough for your elbows to rest on your knees 2. Support your feet on the floor or use a low stool if your feet don't touch the floor  3. Push out your belly as if you have swallowed a beach ball-you should feel a widening of your waist 4. Open and relax your  pelvic floor muscles, rather than tightening around the anus      The following conditions my require modifications to your toileting posture:  . If you have had surgery in the past that limits your back, hip, pelvic, knee or ankle flexibility . Constipation   Your healthcare practitioner may make the following additional suggestions and adjustments:  1) Sit on the toilet  a) Make sure your feet are supported. b) Notice your hip angle and spine position-most people find it effective to lean forward or raise their knees, which can help the muscles around the anus to relax  c) When you lean forward, place your forearms on your thighs for support  2) Relax suggestions a) Breath deeply in through your nose and out slowly through your mouth as if you are smelling the flowers and blowing out the candles. b) To become aware of how to relax your muscles, contracting and releasing muscles can be helpful.  Pull your pelvic floor muscles in tightly by using the image of holding back gas, or closing around the anus (visualize making a circle smaller) and lifting the anus up and in.  Then release the muscles and your anus should drop down and feel open. Repeat 5 times ending with the feeling of relaxation. c) Keep your pelvic floor muscles relaxed; let your belly bulge out. d) The digestive tract starts at the mouth and ends at the anal  opening, so be sure to relax both ends of the tube.  Place your tongue on the roof of your mouth with your teeth separated.  This helps relax your mouth and will help to relax the anus at the same time.  3) Empty (defecation) a) Keep your pelvic floor and sphincter relaxed, then bulge your anal muscles.  Make the anal opening wide.  b) Stick your belly out as if you have swallowed a beach ball. c) Make your belly wall hard using your belly muscles while continuing to breathe. Doing this makes it easier to open your anus. d) Breath out and give a grunt (or try using  other sounds such as ahhhh, shhhhh, ohhhh or grrrrrrr).  4) Finish a) As you finish your bowel movement, pull the pelvic floor muscles up and in.  This will leave your anus in the proper place rather than remaining pushed out and down. If you leave your anus pushed out and down, it will start to feel as though that is normal and give you incorrect signals about needing to have a bowel movement.    Introduction to Bowel Health Diet and daily habits can help you predict when your bowels will move on a regular basis.  The consistency and quantity of the stool is usually more important than the frequency.  The goal is to have a regular bowel movement that is soft but formed.   Tips on Emptying Regularly . Eat breakfast.  Usually the best time of day for a bowel movement will be a half hour to an hour after eating.  These times are best because the body uses the gastrocolic reflex, a stimulation of bowel motion that occurs with eating, to help produce a bowel movement.  For some people even a simple hot drink in the morning can help the reflex action begin. . Eat all your meals at a predictable time each day.  The bowel functions best when food is introduced at the same regular intervals. . The amount of food eaten at a given time of day should be about the same size from day to day.  The bowel functions best when food is introduced in similar quantities from day to day. It is fine to have a small breakfast and a large lunch, or vice versa, just be consistent. . Eat two servings of fruit or vegetables and at least one serving of a complex carbohydrates (whole grains such as brown rice, bran, whole wheat bread, or oatmeal) at each meal. . Drink plenty of water-ideally eight glasses a day.  Be sure to increase your water intake if you are increasing fiber into your diet.  Maintain Healthy Habits . Exercise daily.  You may exercise at any time of day, but you may find that bowel function is helped most if  the exercise is at a consistent time each day. . Make sure that you are not rushed and have convenient access to a bathroom at your selected time to empty your bowels.    Toilet meditation on you tube by [facility]    Toilet [facility] [address], [address] [place], [place] [address] Phone # [phone] Fax [phone]

## 2017-01-25 NOTE — Therapy (Signed)
[facility] [address] [place], [place], [address] Phone: [phone]   Fax:  [phone]  Physical Therapy Evaluation  Patient Details  Name: [name] MRN: [id number] Date of Birth: [birth year] Referring Provider: Dr. Willis Modena   Encounter Date: 01/25/2017  PT End of Session - 01/25/17 1148    Visit Number  1    Date for PT Re-Evaluation  04/19/17    Authorization Type  [employer]    Authorization - Visit Number  1    Authorization - Number of Visits  50    PT Start Time  1100    PT Stop Time  1145    PT Time Calculation (min)  45 min    Activity Tolerance  Patient tolerated treatment well    Behavior During Therapy  [facility] for tasks assessed/performed       Past Medical History:  Diagnosis Date  . Anemia   . Anxiety   . Bronchitis    hx of   . Cervical pain (neck)   . Chronic pancreatitis (HCC)   . Constipation   . DDD (degenerative disc disease), cervical   . Depression   . GERD (gastroesophageal reflux disease)   . History of kidney stones   . History of pancreatitis 01-04-2011  . Hypothyroidism   . IBS (irritable bowel syndrome)   . Nausea DUE TO PAIN FROM KIDNEY STONE  . Pancreatic pseudocyst   . Pneumonia    hx of pneumonia   . PONV (postoperative nausea and vomiting)   . Right flank pain   . Right knee pain   . Right ureteral stone   . Swelling of right knee joint   . Urinary tract infection    hx of     Past Surgical History:  Procedure Laterality Date  . ABDOMINAL HYSTERECTOMY    . ANAL RECTAL MANOMETRY N/A 09/04/2016   Procedure: ANO RECTAL MANOMETRY;  Surgeon: Willis Modena, MD;  Location: [facility];  Service: Endoscopy;  Laterality: N/A;  . ANTERIOR LUMBAR FUSION  11-09-2010    L5 - S1  . BACK SURGERY  2012   lower back surgery   . CERVICAL FUSION  1999   C4 - 7  . CERVICAL SPINE SURGERY  APRIL 2011   REMOVAL SCAR TISSUE  . colonscopy     . CYSTO/ RIGHT  RETROGRADE PYELOGRAM/ RIGHT URETEROSCOPY AND STENT PLACEMENT  09-24-2009   HX RIGHT URETERAL CALCULUS  . CYSTOSCOPY WITH URETEROSCOPY  12/20/2011   Procedure: CYSTOSCOPY WITH URETEROSCOPY;  Surgeon: Milford Cage, MD;  Location: [facility];  Service: Urology;  Laterality: Right;  . DIAGNOSTIC LAPAROSCOPY  1992   ENDOMETRIOSIS  . EUS  03/01/2011   Procedure: UPPER ENDOSCOPIC ULTRASOUND (EUS) LINEAR;  Surgeon: Freddy Jaksch, MD;  Location: [facility];  Service: Endoscopy;  Laterality: N/A;  . FINE NEEDLE ASPIRATION  03/01/2011   Procedure: FINE NEEDLE ASPIRATION (FNA) LINEAR;  Surgeon: Freddy Jaksch, MD;  Location: [facility];  Service: Endoscopy;  Laterality: N/A;  . HERNIA REPAIR  AS CHILD  . JOINT REPLACEMENT    . KNEE ARTHROSCOPY  AUG 2013   RIGHT KNEE  . LAPAROSCOPIC ASSISTED VAGINAL HYSTERECTOMY  1997  . LAPAROSCOPIC CHOLECYSTECTOMY  01-09-2011  . ROBOTIC-ASSISTED RIGHT SALPINGO-OOPHORECTOMY W/ LYSIS ADHESIONS  12-21-2009   HX ENDOMETRIOSIS AND PELVIC PAIN  . TONSILLECTOMY  AS CHILD  . TOTAL KNEE ARTHROPLASTY Right 01/13/2014   Procedure: RIGHT TOTAL KNEE ARTHROPLASTY;  Surgeon:  Shelda Pal, MD;  Location: [facility];  Service: Orthopedics;  Laterality: Right;  . TOTAL KNEE ARTHROPLASTY Left 12/13/2015   Procedure: LEFT TOTAL KNEE ARTHROPLASTY;  Surgeon: Ollen Gross, MD;  Location: [facility];  Service: Orthopedics;  Laterality: Left;  . TOTAL KNEE REVISION Right 08/31/2014   Procedure: RIGHT TOTAL KNEE ARTHROPLASTY RESECTION;  Surgeon: Durene Romans, MD;  Location: [facility];  Service: Orthopedics;  Laterality: Right;  . URETEROSCOPIC STONE EXTRACTIONS     X3    There were no vitals filed for this visit.   Subjective Assessment - 01/25/17 1102    Subjective  Chronic constipation since she was little.  Patient will have one to 2 bowel movements per week. Has to strain to have a bowel movement. Patient will be in the commode for 45 min to 1 hour for a bowel  movement. Patient does not fully evacuate the bowels.     Patient is accompained by:  Family member husband    Patient Stated Goals  decrease strain of bowel movement; reduction of bloating    Currently in Pain?  Yes    Pain Score  8     Pain Location  Abdomen back    Pain Orientation  Anterior;Posterior    Pain Descriptors / Indicators  Dull;Aching    Pain Type  Chronic pain    Pain Onset  More than a month ago    Pain Frequency  Intermittent    Aggravating Factors   just before a bowel movement    Pain Relieving Factors  bowel movement    Multiple Pain Sites  No         [facility] PT Assessment - 01/25/17 0001      Assessment   Medical Diagnosis  K59.00 constipation    Referring Provider  Dr. Willis Modena    Prior Therapy  None      Precautions   Precautions  None      Restrictions   Weight Bearing Restrictions  No      Balance Screen   Has the patient fallen in the past 6 months  No    Has the patient had a decrease in activity level because of a fear of falling?   No    Is the patient reluctant to leave their home because of a fear of falling?   No      Home Public house manager residence      Prior Function   Level of Independence  Independent      Cognition   Overall Cognitive Status  Within Functional Limits for tasks assessed      Observation/Other Assessments   Focus on Therapeutic Outcomes (FOTO)   limited by 60% due to constipation, pain and orthopedic surgeries      Posture/Postural Control   Posture/Postural Control  Postural limitations    Postural Limitations  Rounded Shoulders;Forward head      ROM / Strength   AROM / PROM / Strength  AROM;PROM;Strength      AROM   Lumbar Extension  decreased by 25%    Lumbar - Right Side Bend  decreased by 25%    Lumbar - Left Side Bend  decreased by 25%      Strength   Overall Strength Comments  contract abdominals with lower abdominals pouch out    Right Hip Flexion  4/5    Left Hip  Flexion  4/5      Flexibility   Soft  Tissue Assessment /Muscle Length  yes    Hamstrings  decreased bil.     Quadriceps  decreased bil.     Levator Ani  decreased bil.       Palpation   SI assessment   right ilium is rotated anteriorly    Palpation comment  lower abdominal tenderness with SI provacative test; tenderness located throughout the abdomen; surgical sites have good mobility       Transfers   Transfers  Not assessed      Ambulation/Gait   Ambulation/Gait  No             Objective measurements completed on examination: See above findings.    Pelvic Floor Special Questions - 01/25/17 0001    Prior Pregnancies  Yes    Number of Pregnancies  1    Number of Vaginal Deliveries  1    Any difficulty with labor and deliveries  No    Currently Sexually Active  Yes    Marinoff Scale  no problems    Urinary Leakage  No    Fecal incontinence  No    Falling out feeling (prolapse)  No    Pelvic Floor Internal Exam  will be assessed next visit                PT Education - 01/25/17 1143    Education provided  Yes    Education Details  correct toileting, squatty potty, intro to bowel health, you tube toilet meditation    Person(s) Educated  Patient    Methods  Explanation;Demonstration;Verbal cues;Handout    Comprehension  Verbalized understanding;Returned demonstration       PT Short Term Goals - 01/25/17 1239      PT SHORT TERM GOAL #1   Title  independent with initial HEP    Time  4    Period  Weeks    Status  New    Target Date  02/22/17      PT SHORT TERM GOAL #2   Title  ability to strain </= 25% less with bowel movement due to improve muscle coordination    Time  4    Period  Weeks    Status  New    Target Date  02/22/17      PT SHORT TERM GOAL #3   Title  understand correct toileting technique to have a bowel movement and using you tube video for pelvic floor meditaiton    Time  4    Period  Weeks    Status  New    Target Date   02/22/17      PT SHORT TERM GOAL #4   Title  understand how to perform abdominal massage to asssist in intestinal mobility for improved bowel movement    Time  4    Period  Weeks    Status  New    Target Date  02/22/17        PT Long Term Goals - 01/25/17 1315      PT LONG TERM GOAL #1   Title  independent with HEP and umderstands how to progress them    Time  12    Period  Weeks    Status  New    Target Date  04/19/17      PT LONG TERM GOAL #2   Title  ability to strain >/= 50% less due to relaxing the pelvic floor and generate enough pressure to push the bowels out.  Time  12    Period  Weeks    Status  New    Target Date  04/19/17      PT LONG TERM GOAL #3   Title  resting level of pelvic floor while simulating  bowel movement </= 4uv    Time  12    Period  Weeks    Status  New    Target Date  04/19/17      PT LONG TERM GOAL #4   Title  ability to have 3 or more bowel movements per week due to using techniques to for increasing mobility of intestines    Time  12    Period  Weeks    Status  New    Target Date  04/19/17             Plan - 01/25/17 1149    Clinical Impression Statement  Patient is a 51 year old female with chronic constipation causing severe pain at level 8/10 in the abdomen and back making it difficult to perform daily activities and going out socially.  Patient has tenderness located in her abdomen with tightness in the muscles.  Abdominal strength is 3/5 with protrusion in lower abdominals due to poor muscle coordination.  Patient has weakness in bilateral hip flexion.  Patient will have 2 bowel  movements per week with her abdomen swelling up so it is difficulty for her to wear her clothes.  Patient has a history of many abdominal surgeries increaseing scar tissue in the abdominal region.  Patient pelvic floor strength will be assessed at next visit when she is more comfortable with the therapist.  Patient will benefit from skilled therapy  for education on correct bowel movement posture, improve tissue mobility and improve muscle coordination with anal sphincter and abdominals with bowel movement.     History and Personal Factors relevant to plan of care:  bil. knee replaced; chronic pancreatitis; Partial hysterectomy; low back surgery through abdomen; cervical fusion; kidney stones    Clinical Presentation  Evolving    Clinical Presentation due to:  Patient is not able to go out when she is constipated due to pain    Clinical Decision Making  Moderate    Rehab Potential  Good    Clinical Impairments Affecting Rehab Potential  bil. knee replaced; chronic pancreatitis; Partial hysterectomy; low back surgery through abdomen; cervical fusion; kidney stones    PT Frequency  1x / week    PT Duration  12 weeks    PT Treatment/Interventions  Biofeedback;Cryotherapy;Electrical Stimulation;Moist Heat;Ultrasound;Therapeutic exercise;Therapeutic activities;Neuromuscular re-education;Patient/family education;Manual techniques;Dry needling    PT Next Visit Plan  abdominal massage; pelvic floor EMG; assess pelvic floor through anus    PT Home Exercise Plan  abdominal massage    Consulted and Agree with Plan of Care  Patient;Family member/caregiver    Family Member Consulted  husband       Patient will benefit from skilled therapeutic intervention in order to improve the following deficits and impairments:  Pain, Increased fascial restricitons, Decreased mobility, Decreased endurance, Decreased strength, Decreased coordination  Visit Diagnosis: Muscle weakness (generalized) - Plan: PT plan of care cert/re-cert  Other lack of coordination - Plan: PT plan of care cert/re-cert  Cramp and spasm - Plan: PT plan of care cert/re-cert     Problem List Patient Active Problem List   Diagnosis Date Noted  . OA (osteoarthritis) of knee 12/13/2015  . Obese 09/01/2014  . 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 S/P  revision of total knee 08/31/2014   . S/P right TKA 01/13/2014  . S/P knee replacement 01/13/2014  . Acute bronchitis 04/28/2013  . Chest pain 04/28/2013  . Chronic cough 02/02/2013  . Hypoxia in the setting of recent bronchitis and cough 06/07/2012  . Toxic encephalopathy 06/07/2012  . Leukocytosis 07/27/2011  . Metabolic encephalopathy 07/27/2011  . UTI (lower urinary tract infection) 07/27/2011  . Hx of pancreatitis 07/27/2011  . Anemia 07/27/2011  . Hypothyroid 07/27/2011  . Depression 07/27/2011  . GERD (gastroesophageal reflux disease) 07/27/2011  . Pancreatic pseudocyst 07/27/2011    Eulis Fosterheryl Gray, PT 01/25/17 1:23 PM   [facility] [address], [address] [place], [place], [address] Phone: [phone]   Fax:  [phone]  Name: [name] MRN: [id number] Date of Birth: 01/15/1966

## 2017-01-29 ENCOUNTER — Encounter: Payer: BLUE CROSS/BLUE SHIELD | Admitting: Physical Therapy

## 2017-02-05 ENCOUNTER — Encounter: Payer: Self-pay | Admitting: Physical Therapy

## 2017-02-05 ENCOUNTER — Ambulatory Visit: Payer: BLUE CROSS/BLUE SHIELD | Admitting: Physical Therapy

## 2017-02-05 DIAGNOSIS — M6281 Muscle weakness (generalized): Secondary | ICD-10-CM

## 2017-02-05 DIAGNOSIS — R252 Cramp and spasm: Secondary | ICD-10-CM | POA: Diagnosis not present

## 2017-02-05 DIAGNOSIS — R278 Other lack of coordination: Secondary | ICD-10-CM

## 2017-02-05 NOTE — Therapy (Signed)
[facility] [address], [address] [place], [place], [address] Phone: [phone]   Fax:  [phone]  Physical Therapy Treatment  Patient Details  Name: Mary KillWendy W Huntsman MRN: [id number] Date of Birth: [birth year] Referring Provider: Dr. Willis ModenaWilliam Outlaw   Encounter Date: 02/05/2017  PT End of Session - 02/05/17 1408    Visit Number  2    Date for PT Re-Evaluation  04/19/17    Authorization Type  BCBS    Authorization - Visit Number  2    Authorization - Number of Visits  50    PT Start Time  1400    PT Stop Time  1440    PT Time Calculation (min)  40 min    Activity Tolerance  Patient tolerated treatment well    Behavior During Therapy  [facility] for tasks assessed/performed       Past Medical History:  Diagnosis Date  . Anemia   . Anxiety   . Bronchitis    hx of   . Cervical pain (neck)   . Chronic pancreatitis (HCC)   . Constipation   . DDD (degenerative disc disease), cervical   . Depression   . GERD (gastroesophageal reflux disease)   . History of kidney stones   . History of pancreatitis 01-04-2011  . Hypothyroidism   . IBS (irritable bowel syndrome)   . Nausea DUE TO PAIN FROM KIDNEY STONE  . Pancreatic pseudocyst   . Pneumonia    hx of pneumonia   . PONV (postoperative nausea and vomiting)   . Right flank pain   . Right knee pain   . Right ureteral stone   . Swelling of right knee joint   . Urinary tract infection    hx of     Past Surgical History:  Procedure Laterality Date  . ABDOMINAL HYSTERECTOMY    . ANAL RECTAL MANOMETRY N/A 09/04/2016   Procedure: ANO RECTAL MANOMETRY;  Surgeon: Willis Modenautlaw, William, MD;  Location: [facility];  Service: Endoscopy;  Laterality: N/A;  . ANTERIOR LUMBAR FUSION  11-09-2010    L5 - S1  . BACK SURGERY  2012   lower back surgery   . CERVICAL FUSION  1999   C4 - 7  . CERVICAL SPINE SURGERY  APRIL 2011   REMOVAL SCAR TISSUE  . colonscopy     . CYSTO/ RIGHT RETROGRADE  PYELOGRAM/ RIGHT URETEROSCOPY AND STENT PLACEMENT  09-24-2009   HX RIGHT URETERAL CALCULUS  . CYSTOSCOPY WITH URETEROSCOPY  12/20/2011   Procedure: CYSTOSCOPY WITH URETEROSCOPY;  Surgeon: Milford Cageaniel Young Woodruff, MD;  [facility];  Service: Urology;  Laterality: Right;  . DIAGNOSTIC LAPAROSCOPY  1992   ENDOMETRIOSIS  . EUS  03/01/2011   Procedure: UPPER ENDOSCOPIC ULTRASOUND (EUS) LINEAR;  Surgeon: Freddy JakschWilliam M Outlaw, MD;  Location: [facility];  Service: Endoscopy;  Laterality: N/A;  . FINE NEEDLE ASPIRATION  03/01/2011   Procedure: FINE NEEDLE ASPIRATION (FNA) LINEAR;  Surgeon: Freddy JakschWilliam M Outlaw, MD;  Location: [facility];  Service: Endoscopy;  Laterality: N/A;  . HERNIA REPAIR  AS CHILD  . JOINT REPLACEMENT    . KNEE ARTHROSCOPY  AUG 2013   RIGHT KNEE  . LAPAROSCOPIC ASSISTED VAGINAL HYSTERECTOMY  1997  . LAPAROSCOPIC CHOLECYSTECTOMY  01-09-2011  . ROBOTIC-ASSISTED RIGHT SALPINGO-OOPHORECTOMY W/ LYSIS ADHESIONS  12-21-2009   HX ENDOMETRIOSIS AND PELVIC PAIN  . TONSILLECTOMY  AS CHILD  . TOTAL KNEE ARTHROPLASTY Right 01/13/2014   Procedure: RIGHT TOTAL KNEE ARTHROPLASTY;  Surgeon:  Shelda PalMatthew D Olin, MD;  Location: [facility];  Service: Orthopedics;  Laterality: Right;  . TOTAL KNEE ARTHROPLASTY Left 12/13/2015   Procedure: LEFT TOTAL KNEE ARTHROPLASTY;  Surgeon: Ollen GrossFrank Aluisio, MD;  Location[facility];  Service: Orthopedics;  Laterality: Left;  . TOTAL KNEE REVISION Right 08/31/2014   Procedure: RIGHT TOTAL KNEE ARTHROPLASTY RESECTION;  Surgeon: Durene RomansMatthew Olin, MD;  Location: [facility];  Service: Orthopedics;  Laterality: Right;  . URETEROSCOPIC STONE EXTRACTIONS     X3    There were no vitals filed for this visit.  Subjective Assessment - 02/05/17 1403    Subjective  I have gotten the squatty potty.  If I sit there too long I have pain in my knees and back. I have been using the potty squatty in the morning.     Patient Stated Goals  decrease strain of bowel movement;  reduction of bloating    Currently in Pain?  Yes    Pain Score  6     Pain Location  Abdomen back    Pain Orientation  Anterior;Posterior    Pain Descriptors / Indicators  Dull;Aching    Pain Type  Chronic pain    Pain Onset  More than a month ago    Pain Frequency  Intermittent    Aggravating Factors   just before a bowel movement    Pain Relieving Factors  bowel movement    Multiple Pain Sites  No                   Pelvic Floor Special Questions - 02/05/17 0001    Biofeedback  resting tone is 0.8 uv; quick contraction onle 1 time average is 4.4  uv; 10 second contraction 3.49 uv; 20 second contraction 2.32 uv; contract hols 5 sec then quickly relax with breathing, when add abdominals patient  is not able to contract the pelvic floor     Biofeedback sensor type  Surface rectum    Biofeedback Activity  Quick contraction sidely; assessment        [facility] Adult PT Treatment/Exercise - 02/05/17 0001      Self-Care   Self-Care  Other Self-Care Comments    Other Self-Care Comments   time on squatty potty  to not aggravate the knees and back, after toileting  do 5 quick contractions to improve tone of anal sphincter             PT Education - 02/05/17 1438    Education provided  Yes    Education Details  pelvic floor contraction in sidely    Person(s) Educated  Patient    Methods  Explanation;Demonstration;Verbal cues;Handout    Comprehension  Returned demonstration;Verbalized understanding       PT Short Term Goals - 02/05/17 1443      PT SHORT TERM GOAL #1   Title  independent with initial HEP    Baseline  still learning    Time  4    Period  Weeks    Status  On-going      PT SHORT TERM GOAL #2   Title  ability to strain </= 25% less with bowel movement due to improve muscle coordination    Time  4    Period  Weeks    Status  On-going      PT SHORT TERM GOAL #3   Title  understand correct toileting technique to have a bowel movement and using you  tube video for pelvic floor meditaiton  Time  4    Period  Weeks    Status  Achieved      PT SHORT TERM GOAL #4   Title  understand how to perform abdominal massage to asssist in intestinal mobility for improved bowel movement    Time  4    Period  Weeks    Status  New        PT Long Term Goals - 01/25/17 1315      PT LONG TERM GOAL #1   Title  independent with HEP and umderstands how to progress them    Time  12    Period  Weeks    Status  New    Target Date  04/19/17      PT LONG TERM GOAL #2   Title  ability to strain >/= 50% less due to relaxing the pelvic floor and generate enough pressure to push the bowels out.     Time  12    Period  Weeks    Status  New    Target Date  04/19/17      PT LONG TERM GOAL #3   Title  resting level of pelvic floor while simulating  bowel movement </= 4uv    Time  12    Period  Weeks    Status  New    Target Date  04/19/17      PT LONG TERM GOAL #4   Title  ability to have 3 or more bowel movements per week due to using techniques to for increasing mobility of intestines    Time  12    Period  Weeks    Status  New    Target Date  04/19/17            Plan - 02/05/17 1439    Clinical Impression Statement  Patient has difficulty with pelvic floor contraction with breathing.  Patient is using the squatty potty but if there too long she will have increase in knee and back pain.  Patient is unable to contract her pelvic floor with laughing.  Patient is not able to perfrom 3 quick flicks due to lack of pelvic floor coordination.  Patient will benefit from skilled therapy for correct bowel movement posture, improve tissue mobility and improve muscle coordination with anal sphincter and abdominal  with bowel movement.     Rehab Potential  Good    Clinical Impairments Affecting Rehab Potential  bil. knee replaced; chronic pancreatitis; Partial hysterectomy; low back surgery through abdomen; cervical fusion; kidney stones    PT  Frequency  1x / week    PT Duration  12 weeks    PT Treatment/Interventions  Biofeedback;Cryotherapy;Electrical Stimulation;Moist Heat;Ultrasound;Therapeutic exercise;Therapeutic activities;Neuromuscular re-education;Patient/family education;Manual techniques;Dry needling    PT Next Visit Plan  abdominal massage; pelvic floor EMG; correct pelvis, quick flicks    PT Home Exercise Plan  abdominal massage    Consulted and Agree with Plan of Care  Patient       Patient will benefit from skilled therapeutic intervention in order to improve the following deficits and impairments:  Pain, Increased fascial restricitons, Decreased mobility, Decreased endurance, Decreased strength, Decreased coordination  Visit Diagnosis: Muscle weakness (generalized)  Other lack of coordination  Cramp and spasm     Problem List Patient Active Problem List   Diagnosis Date Noted  . OA (osteoarthritis) of knee 12/13/2015  . Obese 09/01/2014  . 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 S/P revision of total knee 08/31/2014  .  S/P right TKA 01/13/2014  . S/P knee replacement 01/13/2014  . Acute bronchitis 04/28/2013  . Chest pain 04/28/2013  . Chronic cough 02/02/2013  . Hypoxia in the setting of recent bronchitis and cough 06/07/2012  . Toxic encephalopathy 06/07/2012  . Leukocytosis 07/27/2011  . Metabolic encephalopathy 07/27/2011  . UTI (lower urinary tract infection) 07/27/2011  . Hx of pancreatitis 07/27/2011  . Anemia 07/27/2011  . Hypothyroid 07/27/2011  . Depression 07/27/2011  . GERD (gastroesophageal reflux disease) 07/27/2011  . Pancreatic pseudocyst 07/27/2011    Eulis Foster, PT 02/05/17 2:44 PM   [facility] [address], [address] [place], [place], [address] Phone: [phone]   Fax:  [phone]  Name: MYRON LONA MRN: [id number] Date of Birth: [birth year]

## 2017-02-05 NOTE — Patient Instructions (Addendum)
Slow Contraction: Gravity Eliminated (Side-Lying)    Lie on left side, hips and knees slightly bent. Slowly squeeze pelvic floor for __5_ seconds. Rest for _10__ seconds. Repeat _10__ times. Do _3__ times a day.   Copyright  VHI. All rights reserved.  [facility] [address], [address] [place], [place] [address] Phone # [phone] Fax [phone]

## 2017-02-14 ENCOUNTER — Ambulatory Visit: Payer: BLUE CROSS/BLUE SHIELD | Attending: Gastroenterology | Admitting: Physical Therapy

## 2017-02-14 ENCOUNTER — Encounter: Payer: Self-pay | Admitting: Physical Therapy

## 2017-02-14 DIAGNOSIS — M6281 Muscle weakness (generalized): Secondary | ICD-10-CM | POA: Diagnosis not present

## 2017-02-14 DIAGNOSIS — R252 Cramp and spasm: Secondary | ICD-10-CM | POA: Diagnosis not present

## 2017-02-14 DIAGNOSIS — R278 Other lack of coordination: Secondary | ICD-10-CM | POA: Insufficient documentation

## 2017-02-14 NOTE — Patient Instructions (Addendum)
About Abdominal Massage  Abdominal massage, also called external colon massage, is a self-treatment circular massage technique that can reduce and eliminate gas and ease constipation. The colon naturally contracts in waves in a clockwise direction starting from inside the right hip, moving up toward the ribs, across the belly, and down inside the left hip.  When you perform circular abdominal massage, you help stimulate your colon's normal wave pattern of movement called peristalsis.  It is most beneficial when done after eating.  Positioning You can practice abdominal massage with oil while lying down, or in the shower with soap.  Some people find that it is just as effective to do the massage through clothing while sitting or standing.  How to Massage Start by placing your finger tips or knuckles on your right side, just inside your hip bone.  . Make small circular movements while you move upward toward your rib cage.   . Once you reach the bottom right side of your rib cage, take your circular movements across to the left side of the bottom of your rib cage.  . Next, move downward until you reach the inside of your left hip bone.  This is the path your feces travel in your colon. . Continue to perform your abdominal massage in this pattern for 10 minutes each day.     You can apply as much pressure as is comfortable in your massage.  Start gently and build pressure as you continue to practice.  Notice any areas of pain as you massage; areas of slight pain may be relieved as you massage, but if you have areas of significant or intense pain, consult with your healthcare provider.  Other Considerations . General physical activity including bending and stretching can have a beneficial massage-like effect on the colon.  Deep breathing can also stimulate the colon because breathing deeply activates the same nervous system that supplies the colon.   . Abdominal massage should always be used in  combination with a bowel-conscious diet that is high in the proper type of fiber for you, fluids (primarily water), and a regular exercise program. [facility] [address], [address] [place], [place] [address] Phone # [phone] Fax [phone]  

## 2017-02-14 NOTE — Therapy (Signed)
[facility] [address], [address] [place], [place], [address] Phone: [phone]   Fax:  [phone]  Physical Therapy Treatment  Patient Details  Name: [name] MRN: [id number] Date of Birth: 1966-01-23 Referring Provider: Dr. Willis Modena   Encounter Date: 02/14/2017  PT End of Session - 02/14/17 1157    Visit Number  3    Date for PT Re-Evaluation  04/19/17    Authorization Type  BCBS    Authorization - Visit Number  3    Authorization - Number of Visits  50    PT Start Time  1152    PT Stop Time  1230    PT Time Calculation (min)  38 min    Activity Tolerance  Patient tolerated treatment well    Behavior During Therapy  [facility] for tasks assessed/performed       Past Medical History:  Diagnosis Date  . Anemia   . Anxiety   . Bronchitis    hx of   . Cervical pain (neck)   . Chronic pancreatitis (HCC)   . Constipation   . DDD (degenerative disc disease), cervical   . Depression   . GERD (gastroesophageal reflux disease)   . History of kidney stones   . History of pancreatitis 01-04-2011  . Hypothyroidism   . IBS (irritable bowel syndrome)   . Nausea DUE TO PAIN FROM KIDNEY STONE  . Pancreatic pseudocyst   . Pneumonia    hx of pneumonia   . PONV (postoperative nausea and vomiting)   . Right flank pain   . Right knee pain   . Right ureteral stone   . Swelling of right knee joint   . Urinary tract infection    hx of     Past Surgical History:  Procedure Laterality Date  . ABDOMINAL HYSTERECTOMY    . ANAL RECTAL MANOMETRY N/A 09/04/2016   Procedure: ANO RECTAL MANOMETRY;  Surgeon: Willis Modena, MD;  Location: [facility];  Service: Endoscopy;  Laterality: N/A;  . ANTERIOR LUMBAR FUSION  11-09-2010    L5 - S1  . BACK SURGERY  2012   lower back surgery   . CERVICAL FUSION  1999   C4 - 7  . CERVICAL SPINE SURGERY  APRIL 2011   REMOVAL SCAR TISSUE  . colonscopy     . CYSTO/ RIGHT RETROGRADE  PYELOGRAM/ RIGHT URETEROSCOPY AND STENT PLACEMENT  09-24-2009   HX RIGHT URETERAL CALCULUS  . CYSTOSCOPY WITH URETEROSCOPY  12/20/2011   Procedure: CYSTOSCOPY WITH URETEROSCOPY;  Surgeon: Milford Cage, MD;  Location: [facility];  Service: Urology;  Laterality: Right;  . DIAGNOSTIC LAPAROSCOPY  1992   ENDOMETRIOSIS  . EUS  03/01/2011   Procedure: UPPER ENDOSCOPIC ULTRASOUND (EUS) LINEAR;  Surgeon: Freddy Jaksch, MD;  [facility]: [facility];  Service: Endoscopy;  Laterality: N/A;  . FINE NEEDLE ASPIRATION  03/01/2011   Procedure: FINE NEEDLE ASPIRATION (FNA) LINEAR;  Surgeon: Freddy Jaksch, MD;  [facility]: [facility];  Service: Endoscopy;  Laterality: N/A;  . HERNIA REPAIR  AS CHILD  . JOINT REPLACEMENT    . KNEE ARTHROSCOPY  AUG 2013   RIGHT KNEE  . LAPAROSCOPIC ASSISTED VAGINAL HYSTERECTOMY  1997  . LAPAROSCOPIC CHOLECYSTECTOMY  01-09-2011  . ROBOTIC-ASSISTED RIGHT SALPINGO-OOPHORECTOMY W/ LYSIS ADHESIONS  12-21-2009   HX ENDOMETRIOSIS AND PELVIC PAIN  . TONSILLECTOMY  AS CHILD  . TOTAL KNEE ARTHROPLASTY Right 01/13/2014   Procedure: RIGHT TOTAL KNEE ARTHROPLASTY;  Surgeon:  Shelda PalMatthew D Olin, MD;  Location: [facility];  Service: Orthopedics;  Laterality: Right;  . TOTAL KNEE ARTHROPLASTY Left 12/13/2015   Procedure: LEFT TOTAL KNEE ARTHROPLASTY;  Surgeon: Ollen GrossFrank Aluisio, MD;  Location[facility];  Service: Orthopedics;  Laterality: Left;  . TOTAL KNEE REVISION Right 08/31/2014   Procedure: RIGHT TOTAL KNEE ARTHROPLASTY RESECTION;  Surgeon: Durene RomansMatthew Olin, MD;  [facility];  Service: Orthopedics;  Laterality: Right;  . URETEROSCOPIC STONE EXTRACTIONS     X3    There were no vitals filed for this visit.  Subjective Assessment - 02/14/17 1154    Subjective  I feel good from last visit.. I feel like I am fully emptying her bowels.     Patient Stated Goals  decrease strain of bowel movement; reduction of bloating    Currently in Pain?  Yes    Pain Score  5     Pain  Location  Abdomen    Pain Orientation  Lower;Anterior    Pain Type  Chronic pain    Pain Onset  More than a month ago    Pain Frequency  Intermittent    Aggravating Factors   not having a bowel movement    Pain Relieving Factors  bowel movement    Multiple Pain Sites  No                      [facility] Adult PT Treatment/Exercise - 02/14/17 0001      Manual Therapy   Manual Therapy  Soft tissue mobilization;Myofascial release    Manual therapy comments  soft tissue work to lumbar paraspinals with assistive device    Soft tissue mobilization  lumbar paraspinals, bil. diaphgram, transverse abdominus, obliques    Myofascial Release  the lumbar and abdominal fascia in supine, prone and stand with leaning on mat  to bring the fasia forward to decrease abdominal strain             PT Education - 02/14/17 1227    Education provided  Yes    Education Details  abdominal massage    Person(s) Educated  Patient    Methods  Explanation;Demonstration;Verbal cues;Handout    Comprehension  Returned demonstration;Verbalized understanding       PT Short Term Goals - 02/14/17 1156      PT SHORT TERM GOAL #1   Title  independent with initial HEP    Time  4    Period  Weeks    Status  Achieved      PT SHORT TERM GOAL #2   Title  ability to strain </= 25% less with bowel movement due to improve muscle coordination    Baseline  bowel movements are 80% better    Time  4    Period  Weeks    Status  Achieved      PT SHORT TERM GOAL #3   Title  understand correct toileting technique to have a bowel movement and using you tube video for pelvic floor meditaiton    Time  4    Period  Weeks    Status  Achieved      PT SHORT TERM GOAL #4   Title  understand how to perform abdominal massage to asssist in intestinal mobility for improved bowel movement    Time  4    Period  Weeks    Status  Achieved        PT Long Term Goals - 01/25/17 1315  PT LONG TERM GOAL #1    Title  independent with HEP and umderstands how to progress them    Time  12    Period  Weeks    Status  New    Target Date  04/19/17      PT LONG TERM GOAL #2   Title  ability to strain >/= 50% less due to relaxing the pelvic floor and generate enough pressure to push the bowels out.     Time  12    Period  Weeks    Status  New    Target Date  04/19/17      PT LONG TERM GOAL #3   Title  resting level of pelvic floor while simulating  bowel movement </= 4uv    Time  12    Period  Weeks    Status  New    Target Date  04/19/17      PT LONG TERM GOAL #4   Title  ability to have 3 or more bowel movements per week due to using techniques to for increasing mobility of intestines    Time  12    Period  Weeks    Status  New    Target Date  04/19/17            Plan - 02/14/17 1229    Clinical Impression Statement  Patient is now able to have a bowel movement without straining.  She knows to not strain and how to sit on commode correctly.  Patient has tissue restrictions in the abdoment and lumbar region.  Patient had reduction in pain after soft tissue work and able to contract the abdominal correctly after the soft tissue work.  Patient is independent with her initial HEP and understands how to perform the abdominal massage. Patient will benefit from skilled therapy for correct bowel movement posture, improve tissue mobility and improve muscle coordination with anal sphincter and abdominal with bowel movement.     Rehab Potential  Good    Clinical Impairments Affecting Rehab Potential  bil. knee replaced; chronic pancreatitis; Partial hysterectomy; low back surgery through abdomen; cervical fusion; kidney stones    PT Frequency  1x / week    PT Duration  12 weeks    PT Treatment/Interventions  Biofeedback;Cryotherapy;Electrical Stimulation;Moist Heat;Ultrasound;Therapeutic exercise;Therapeutic activities;Neuromuscular re-education;Patient/family education;Manual techniques;Dry  needling    PT Next Visit Plan  abdominal massage;correct pelvis, quick flicks    PT Home Exercise Plan  MD signed initial note    Consulted and Agree with Plan of Care  Patient       Patient will benefit from skilled therapeutic intervention in order to improve the following deficits and impairments:  Pain, Increased fascial restricitons, Decreased mobility, Decreased endurance, Decreased strength, Decreased coordination  Visit Diagnosis: Muscle weakness (generalized)  Other lack of coordination  Cramp and spasm     Problem List Patient Active Problem List   Diagnosis Date Noted  . OA (osteoarthritis) of knee 12/13/2015  . Obese 09/01/2014  . 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 S/P revision of total knee 08/31/2014  . S/P right TKA 01/13/2014  . S/P knee replacement 01/13/2014  . Acute bronchitis 04/28/2013  . Chest pain 04/28/2013  . Chronic cough 02/02/2013  . Hypoxia in the setting of recent bronchitis and cough 06/07/2012  . Toxic encephalopathy 06/07/2012  . Leukocytosis 07/27/2011  . Metabolic encephalopathy 07/27/2011  . UTI (lower urinary tract infection) 07/27/2011  . Hx of pancreatitis 07/27/2011  . Anemia  07/27/2011  . Hypothyroid 07/27/2011  . Depression 07/27/2011  . GERD (gastroesophageal reflux disease) 07/27/2011  . Pancreatic pseudocyst 07/27/2011    [name], PT 02/14/17 12:33 PM   [facility] [address] [place], [place], [address] Phone: [phone]   Fax:  [phone]  Name: [name] MRN: [id number] Date of Birth: [birth year]

## 2017-02-21 ENCOUNTER — Encounter: Payer: Self-pay | Admitting: Physical Therapy

## 2017-02-21 ENCOUNTER — Ambulatory Visit: Payer: BLUE CROSS/BLUE SHIELD | Admitting: Physical Therapy

## 2017-02-21 DIAGNOSIS — M6281 Muscle weakness (generalized): Secondary | ICD-10-CM

## 2017-02-21 DIAGNOSIS — R278 Other lack of coordination: Secondary | ICD-10-CM

## 2017-02-21 DIAGNOSIS — R252 Cramp and spasm: Secondary | ICD-10-CM | POA: Diagnosis not present

## 2017-02-21 NOTE — Therapy (Addendum)
[facility] [address], [place] [facility] [place], [place], [address] Phone: [phone]   Fax:  [phone]  Physical Therapy Treatment  Patient Details  Name: [name] MRN: [id number] Date of Birth: 08-31-1965 Referring Provider: Dr. Arta Silence   Encounter Date: 02/21/2017  PT End of Session - 02/21/17 1256    Visit Number  4    Date for PT Re-Evaluation  04/19/17    Authorization Type  [employer]    Authorization - Visit Number  4    Authorization - Number of Visits  50    PT Start Time  6160    PT Stop Time  1225    PT Time Calculation (min)  40 min    Activity Tolerance  Patient tolerated treatment well    Behavior During Therapy  [facility] for tasks assessed/performed       Past Medical History:  Diagnosis Date  . Anemia   . Anxiety   . Bronchitis    hx of   . Cervical pain (neck)   . Chronic pancreatitis ([place])   . Constipation   . DDD (degenerative disc disease), cervical   . Depression   . GERD (gastroesophageal reflux disease)   . History of kidney stones   . History of pancreatitis 01-04-2011  . Hypothyroidism   . IBS (irritable bowel syndrome)   . Nausea DUE TO PAIN FROM KIDNEY STONE  . Pancreatic pseudocyst   . Pneumonia    hx of pneumonia   . PONV (postoperative nausea and vomiting)   . Right flank pain   . Right knee pain   . Right ureteral stone   . Swelling of right knee joint   . Urinary tract infection    hx of     Past Surgical History:  Procedure Laterality Date  . ABDOMINAL HYSTERECTOMY    . ANAL RECTAL MANOMETRY N/A 09/04/2016   Procedure: ANO RECTAL MANOMETRY;  Surgeon: Arta Silence, MD;  Location: [facility];  Service: Endoscopy;  Laterality: N/A;  . ANTERIOR LUMBAR FUSION  11-09-2010    L5 - S1  . BACK SURGERY  2012   lower back surgery   . CERVICAL FUSION  1999   C4 - 7  . CERVICAL SPINE SURGERY  APRIL 2011   REMOVAL SCAR TISSUE  . colonscopy     . CYSTO/ RIGHT RETROGRADE  PYELOGRAM/ RIGHT URETEROSCOPY AND STENT PLACEMENT  09-24-2009   HX RIGHT URETERAL CALCULUS  . CYSTOSCOPY WITH URETEROSCOPY  12/20/2011   Procedure: CYSTOSCOPY WITH URETEROSCOPY;  Surgeon: Molli Hazard, MD;  Location[facility];  Service: Urology;  Laterality: Right;  . DIAGNOSTIC LAPAROSCOPY  1992   ENDOMETRIOSIS  . EUS  03/01/2011   Procedure: UPPER ENDOSCOPIC ULTRASOUND (EUS) LINEAR;  Surgeon: Landry Dyke, MD;  Location: [facility];  Service: Endoscopy;  Laterality: N/A;  . FINE NEEDLE ASPIRATION  03/01/2011   Procedure: FINE NEEDLE ASPIRATION (FNA) LINEAR;  Surgeon: Landry Dyke, MD;  Location: [facility];  Service: Endoscopy;  Laterality: N/A;  . HERNIA REPAIR  AS CHILD  . JOINT REPLACEMENT    . KNEE ARTHROSCOPY  AUG 2013   RIGHT KNEE  . LAPAROSCOPIC ASSISTED VAGINAL HYSTERECTOMY  1997  . LAPAROSCOPIC CHOLECYSTECTOMY  01-09-2011  . ROBOTIC-ASSISTED RIGHT SALPINGO-OOPHORECTOMY W/ LYSIS ADHESIONS  12-21-2009   HX ENDOMETRIOSIS AND PELVIC PAIN  . TONSILLECTOMY  AS CHILD  . TOTAL KNEE ARTHROPLASTY Right 01/13/2014   Procedure: RIGHT TOTAL KNEE ARTHROPLASTY;  Surgeon:  Mauri Pole, MD;  Location: [facility];  Service: Orthopedics;  Laterality: Right;  . TOTAL KNEE ARTHROPLASTY Left 12/13/2015   Procedure: LEFT TOTAL KNEE ARTHROPLASTY;  Surgeon: Gaynelle Arabian, MD;  Location: [facility];  Service: Orthopedics;  Laterality: Left;  . TOTAL KNEE REVISION Right 08/31/2014   Procedure: RIGHT TOTAL KNEE ARTHROPLASTY RESECTION;  Surgeon: Paralee Cancel, MD;  Location: [facility];  Service: Orthopedics;  Laterality: Right;  . URETEROSCOPIC STONE EXTRACTIONS     X3    There were no vitals filed for this visit.  Subjective Assessment - 02/21/17 1149    Subjective  I have something wrong with my eyes. I am going to have eye surgery. I have personal stress. I am not straining now and I am breathing with using my stool. Massage is helping my stomach. I am going to a nutrionist  to help me eat better. I have not had a bowel movement in 4 days.     Patient Stated Goals  decrease strain of bowel movement; reduction of bloating    Currently in Pain?  Yes    Pain Score  5     Pain Location  Abdomen    Pain Orientation  Lower;Anterior    Pain Descriptors / Indicators  Aching;Dull    Pain Type  Chronic pain    Pain Onset  More than a month ago    Pain Frequency  Intermittent    Aggravating Factors   not having a bowel movement    Pain Relieving Factors  bowel movement    Multiple Pain Sites  No                      [facility] Adult PT Treatment/Exercise - 02/21/17 0001      Manual Therapy   Manual Therapy  Soft tissue mobilization;Myofascial release    Manual therapy comments  bil. diaphgram, transverse abdominus, above and below umbilicus,     Myofascial Release  release of the respiratory diaphgram and urogenital diaphgram with going through 3 ways of restriction using one hand anterior and other hand posterior               PT Short Term Goals - 02/14/17 1156      PT SHORT TERM GOAL #1   Title  independent with initial HEP    Time  4    Period  Weeks    Status  Achieved      PT SHORT TERM GOAL #2   Title  ability to strain </= 25% less with bowel movement due to improve muscle coordination    Baseline  bowel movements are 80% better    Time  4    Period  Weeks    Status  Achieved      PT SHORT TERM GOAL #3   Title  understand correct toileting technique to have a bowel movement and using you tube video for pelvic floor meditaiton    Time  4    Period  Weeks    Status  Achieved      PT SHORT TERM GOAL #4   Title  understand how to perform abdominal massage to asssist in intestinal mobility for improved bowel movement    Time  4    Period  Weeks    Status  Achieved        PT Long Term Goals - 02/21/17 1156      PT LONG TERM GOAL #1   Title  independent with HEP and umderstands how to progress them    Baseline  still  learning    Time  12    Period  Weeks    Status  On-going      PT LONG TERM GOAL #2   Title  ability to strain >/= 50% less due to relaxing the pelvic floor and generate enough pressure to push the bowels out.     Time  12    Period  Weeks    Status  Achieved      PT LONG TERM GOAL #3   Title  resting level of pelvic floor while simulating  bowel movement </= 4uv    Time  12    Period  Weeks    Status  New      PT LONG TERM GOAL #4   Title  ability to have 3 or more bowel movements per week due to using techniques to for increasing mobility of intestines    Baseline  2 bowel movements per week    Time  12    Period  Weeks    Status  On-going            Plan - 02/21/17 1257    Clinical Impression Statement  Patient is able to have a bowel movement without straining.  Patient is able to have 2 bowel movements per week. Patient has tightness in her abdomen.  After soft tissue work to abdomen increased bowel sounds and reduction in abdominal swelling. Patient showed therapist an abdominal curl-up  and had difficulty. Patient will benefit  from skilled threap for correct bowel movement psoture, umprove  tissue mobility and improve muscle coordination with anal sphincter and abdominal with bower movement.     Rehab Potential  Good    Clinical Impairments Affecting Rehab Potential  bil. knee replaced; chronic pancreatitis; Partial hysterectomy; low back surgery through abdomen; cervical fusion; kidney stones    PT Frequency  1x / week    PT Duration  12 weeks    PT Treatment/Interventions  Biofeedback;Cryotherapy;Electrical Stimulation;Moist Heat;Ultrasound;Therapeutic exercise;Therapeutic activities;Neuromuscular re-education;Patient/family education;Manual techniques;Dry needling    PT Next Visit Plan  abdominal strength; pelvic EMG    PT Home Exercise Plan  MD signed initial note    Consulted and Agree with Plan of Care  Patient       Patient will benefit from skilled  therapeutic intervention in order to improve the following deficits and impairments:  Pain, Increased fascial restricitons, Decreased mobility, Decreased endurance, Decreased strength, Decreased coordination  Visit Diagnosis: Muscle weakness (generalized)  Other lack of coordination  Cramp and spasm     Problem List Patient Active Problem List   Diagnosis Date Noted  . OA (osteoarthritis) of knee 12/13/2015  . Obese 09/01/2014  . 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 S/P revision of total knee 08/31/2014  . S/P right TKA 01/13/2014  . S/P knee replacement 01/13/2014  . Acute bronchitis 04/28/2013  . Chest pain 04/28/2013  . Chronic cough 02/02/2013  . Hypoxia in the setting of recent bronchitis and cough 06/07/2012  . Toxic encephalopathy 06/07/2012  . Leukocytosis 07/27/2011  . Metabolic encephalopathy 01/11/5944  . UTI (lower urinary tract infection) 07/27/2011  . Hx of pancreatitis 07/27/2011  . Anemia 07/27/2011  . Hypothyroid 07/27/2011  . Depression 07/27/2011  . GERD (gastroesophageal reflux disease) 07/27/2011  . Pancreatic pseudocyst 07/27/2011    Earlie Counts, PT 02/21/17 1:03 PM   [facility] [address]  [address] [place], [place], [address] Phone: [phone]   Fax:  [phone]  Name: [name] MRN: [id number] Date of Birth: Jul 31, 1965  PHYSICAL THERAPY DISCHARGE SUMMARY  Visits from Start of Care: 4  Current functional level related to goals / functional outcomes: See above.    Remaining deficits: See above.  Patient was waiting to have eye surgery and has not called back to reschedule her appointment.    Education / Equipment: HEP Plan:                                                    Patient goals were not met. Patient is being discharged due to not returning since the last visit. Thank you for the referral. Earlie Counts, PT 03/29/17 10:00 AM   ?????

## 2017-02-28 ENCOUNTER — Encounter: Payer: BLUE CROSS/BLUE SHIELD | Admitting: Physical Therapy

## 2017-03-08 ENCOUNTER — Encounter: Payer: BLUE CROSS/BLUE SHIELD | Admitting: Physical Therapy

## 2017-03-15 ENCOUNTER — Encounter: Payer: BLUE CROSS/BLUE SHIELD | Admitting: Physical Therapy

## 2017-03-15 DIAGNOSIS — H11823 Conjunctivochalasis, bilateral: Secondary | ICD-10-CM | POA: Diagnosis not present

## 2017-03-20 ENCOUNTER — Telehealth: Payer: Self-pay | Admitting: Physical Therapy

## 2017-03-20 NOTE — Telephone Encounter (Signed)
Called patient. Spoke to patient.  She is going to see an eye specialist and will not be attending therapy yet.  Her cert ends on 2/7 so we will keep her chart open till then.  Eulis FosterCheryl Gimena Buick, PT @1 /10/2017@ 3:18 PM

## 2017-03-21 ENCOUNTER — Encounter: Payer: Self-pay | Admitting: Registered"

## 2017-03-21 ENCOUNTER — Encounter: Payer: BLUE CROSS/BLUE SHIELD | Attending: Family Medicine | Admitting: Registered"

## 2017-03-21 DIAGNOSIS — E669 Obesity, unspecified: Secondary | ICD-10-CM

## 2017-03-21 DIAGNOSIS — Z713 Dietary counseling and surveillance: Secondary | ICD-10-CM | POA: Diagnosis not present

## 2017-03-21 DIAGNOSIS — K859 Acute pancreatitis without necrosis or infection, unspecified: Secondary | ICD-10-CM | POA: Diagnosis not present

## 2017-03-21 NOTE — Patient Instructions (Addendum)
Recommend doing 4-5 small meals/snacks per day.   Try to have balanced meals like the My Plate example (see handout). Try to include more vegetables, fruits, and whole grains at meals.   Try to limit intake of high fat and spicy foods (see handout).  Try to include more water and less sugar sweetened beverages. Try to work toward getting in around 64 oz of water per day. Recommend sipping on water throughout the day. This can also help with constipation as well.   Make sure there are at least 3 hours between your last meal and when you go to sleep    Practice Mindful Eating  At meal and snack times, put away electronics (TV, phone, tablet, etc.) and try to eat seated at a table so you can better focus on eating your meal/snack and promote listening to your body's fullness and hunger signals.  If you feel that you are wanting to snack because your are bored or due to emotions and not because you are hungry, try to do a fun activity (read a book, take a walk, talk with a friend, etc.) for at least 20 minutes.  Recommend finding a fun hobby that can help you when feeling down or bored when home alone and help reduce eating due to boredom/emotions.    Https://whatscooking.AdDates.czfns.usda.gov/ -help with healthy recipes   Calorieking.com -nutrition information on packaged foods and at restaurants for help with choosing low fat foods.   Myfitnesspal free app to track food intake and help assess fat content of foods.

## 2017-03-21 NOTE — Progress Notes (Signed)
Medical Nutrition Therapy:  Appt start time: 1010 end time:  1130.   Assessment:  Primary concerns today: Pt referred for weight management and chronic pancreatitis. Pt reports she developed chronic pancreatitis from a gallstone. Pt also reports that she has had IBS-Constipation since a child. Pt also has GERD and interstitial cystitis. Pt reports bloating and tenderness in her abdomen following meals, especially if she consumes spicy foods. Pt is taking a prescribed high dose of vitamin D due to vitamin D deficiency. Pt was unsure of what her last vitamin D level was, but reports that her doctor is going to recheck her levels soon. Pt says she would like more information on how to cook healthier meals. She reports she and her husband prepare [place] meals that are higher in sugar and fat. Pt reports she tries to watch her fat intake and eat low fat foods.   Pt reports that she often wants to snack if she gets depressed or emotional and reports getting depressed sometimes due to having several health conditions. Pt reports she sees a therapist for her depression. Pt reports that her mother lives next door and that also puts more stress on her. She reports that her husband is out of town for business sometimes and she will snack more often when he is away.   Preferred Learning Style:   No preference indicated   Learning Readiness:   Ready  MEDICATIONS: See list.    DIETARY INTAKE:  Usual eating pattern includes 3 meals and several snacks each day. Meals eaten at home are usually eaten together at the table. Pt reports sometimes eating in front of TV in living room if husband wants to watch a show, but that she prefers eating at the table. Pt reports that she tries to avoid eating close to bedtime.   Everyday foods include granola or protein bars. Avoided foods include high fat foods. Pt reports that drinking too much water or tea at one time sometimes makes her throw up.     24-hr recall:  B  ( AM): 2 Granola bars (100 kcal each), water, Code Red Mt. Dew Snk ( AM): None reported.  L ( PM): English muffin, honey Malaw[place]urkey, sweet tea (less sugar than used to put in tea) Snk ( PM): granola bar (100 kcal) D ( PM): Subway teriyaki chicken, cheese, lettuce, tomatoes, cucumbers, green peppers on 6" sunflower seed bread, unsure what she had to drink Snk ( PM): 2 granola bar (100 kcal), 1 PET fudge bar Beverages: Code Red Mt. Dew ~12 oz, some water  Usual physical activity: Pt reports that she used to walk some for activity and she used to enjoy swimming.   Progress Towards Goal(s):  In progress.   Nutritional Diagnosis:  NI-5.11.1 Predicted suboptimal nutrient intake As related to unbalanced meals, inadequate intake of plain water, and emotional eating/snacking.  As evidenced by pt's reported dietary recall and habits .    Intervention:  Nutrition counseling provided. At beginning of appointment pt started getting a little down with discussing her health conditions. Discussed how pt should be proud of herself for coming to her appointment today and for all the positive things she is making efforts to do for herself. Dietitian provided education regarding balanced nutrition, mindful eating, and nutrition therapy for pancreatitis and GERD. Recommended pt eat 4-5 small meals rather than current 3 regular meals to help with bloating and pain following meals which is likely due to pancreatitis and/or GERD. Pt is going to call  doctor's office to ask about how she should take [place] with doing more, smaller meals versus her current schedule of 3 meals per day. Discussed strategies to help with emotional eating. Recommended pt find a fun hobby she would enjoy doing so she will having something fun to do and occupy her time and help lift her spirits when she is home alone. Pt reports she could start walking some. Discussed that pt could include walking for physical activity, but to ensure any physical activity  she does is cleared with her doctor. Discussed tracking food intake with MyFitnessPal and journal any GI symptoms she encounters so she can assess if there are certain foods or eating habits that may attributing to symptoms. Encouraged pt to drink more water and less sugar sweetened beverages. Discussed sipping on water throughout the day rather than drinking a lot at one time. Pt appeared agreeable to information/goals discussed.   Goals/Instructions:   Recommend doing 4-5 small meals/snacks per day.   Try to have balanced meals like the My Plate example (see handout). Try to include more vegetables, fruits, and whole grains at meals.   Try to limit intake of high fat and spicy foods (see handout).  Try to include more water and less sugar sweetened beverages. Try to work toward getting in around 64 oz of water per day. Recommend sipping on water throughout the day. This can also help with constipation as well.   Make sure there are at least 3 hours between your last meal and when you go to sleep    Practice Mindful Eating  At meal and snack times, put away electronics (TV, phone, tablet, etc.) and try to eat seated at a table so you can better focus on eating your meal/snack and promote listening to your body's fullness and hunger signals.  If you feel that you are wanting to snack because your are bored or due to emotions and not because you are hungry, try to do a fun activity (read a book, take a walk, talk with a friend, etc.) for at least 20 minutes.  Recommend finding a fun hobby that can help you when feeling down or bored when home alone and help reduce eating due to boredom/emotions.    Https://whatscooking.AdDates.cz -help with healthy recipes   Calorieking.com -nutrition information on packaged foods and at restaurants for help with choosing low fat foods.   My Fitness Pal free app to track food intake and help assess fat content of foods.   Symptom journal to help assess  if certain foods/habits may be attributing to/worsening GI symptoms.   Teaching Method Utilized:  Visual Auditory  Handouts given during visit include:  Balanced plate with food list.   Nutrition Therapy for Pancreatitis  Barriers to learning/adherence to lifestyle change: None indicated.   Demonstrated degree of understanding via:  Teach Back   Monitoring/Evaluation:  Dietary intake, exercise, and body weight in 1 month(s).

## 2017-03-22 ENCOUNTER — Encounter: Payer: BLUE CROSS/BLUE SHIELD | Admitting: Physical Therapy

## 2017-03-23 DIAGNOSIS — H02413 Mechanical ptosis of bilateral eyelids: Secondary | ICD-10-CM | POA: Diagnosis not present

## 2017-03-28 DIAGNOSIS — H02413 Mechanical ptosis of bilateral eyelids: Secondary | ICD-10-CM | POA: Diagnosis not present

## 2017-03-29 ENCOUNTER — Encounter: Payer: BLUE CROSS/BLUE SHIELD | Admitting: Physical Therapy

## 2017-03-30 DIAGNOSIS — M47816 Spondylosis without myelopathy or radiculopathy, lumbar region: Secondary | ICD-10-CM | POA: Diagnosis not present

## 2017-04-05 ENCOUNTER — Encounter: Payer: BLUE CROSS/BLUE SHIELD | Admitting: Physical Therapy

## 2017-04-05 DIAGNOSIS — Z23 Encounter for immunization: Secondary | ICD-10-CM | POA: Diagnosis not present

## 2017-04-10 DIAGNOSIS — M961 Postlaminectomy syndrome, not elsewhere classified: Secondary | ICD-10-CM | POA: Diagnosis not present

## 2017-04-10 DIAGNOSIS — M47817 Spondylosis without myelopathy or radiculopathy, lumbosacral region: Secondary | ICD-10-CM | POA: Diagnosis not present

## 2017-04-10 DIAGNOSIS — M47812 Spondylosis without myelopathy or radiculopathy, cervical region: Secondary | ICD-10-CM | POA: Diagnosis not present

## 2017-04-10 DIAGNOSIS — G894 Chronic pain syndrome: Secondary | ICD-10-CM | POA: Diagnosis not present

## 2017-04-23 DIAGNOSIS — H02413 Mechanical ptosis of bilateral eyelids: Secondary | ICD-10-CM | POA: Diagnosis not present

## 2017-04-23 DIAGNOSIS — H02411 Mechanical ptosis of right eyelid: Secondary | ICD-10-CM | POA: Diagnosis not present

## 2017-04-23 DIAGNOSIS — H02403 Unspecified ptosis of bilateral eyelids: Secondary | ICD-10-CM | POA: Diagnosis not present

## 2017-04-23 DIAGNOSIS — H02412 Mechanical ptosis of left eyelid: Secondary | ICD-10-CM | POA: Diagnosis not present

## 2017-04-25 ENCOUNTER — Ambulatory Visit: Payer: BLUE CROSS/BLUE SHIELD | Admitting: Registered"

## 2017-05-10 ENCOUNTER — Ambulatory Visit: Payer: BLUE CROSS/BLUE SHIELD | Admitting: Podiatry

## 2017-05-21 ENCOUNTER — Ambulatory Visit: Payer: BLUE CROSS/BLUE SHIELD | Admitting: Podiatry

## 2017-05-21 ENCOUNTER — Ambulatory Visit (INDEPENDENT_AMBULATORY_CARE_PROVIDER_SITE_OTHER): Payer: BLUE CROSS/BLUE SHIELD

## 2017-05-21 ENCOUNTER — Encounter: Payer: Self-pay | Admitting: Podiatry

## 2017-05-21 VITALS — BP 124/78 | HR 85 | Resp 16

## 2017-05-21 DIAGNOSIS — M2021 Hallux rigidus, right foot: Secondary | ICD-10-CM

## 2017-05-21 DIAGNOSIS — S92301A Fracture of unspecified metatarsal bone(s), right foot, initial encounter for closed fracture: Secondary | ICD-10-CM | POA: Diagnosis not present

## 2017-05-21 NOTE — Progress Notes (Signed)
Subjective:   Patient ID: [name], female   DOB: 52 y.o.   MRN: [id number]   HPI Patient presents stating for the last 6 or 7 weeks have had a lot of pain on top of my right foot and is been gradually making it harder for me to walk.  Patient states that she did drop a shampoo bottle on her foot   ROS      Objective:  Physical Exam  Neurovascular status intact with intense discomfort second metatarsal base right with swelling around the area and continued excellent range of motion of the first MPJ right secondary to previous surgery for chronic hallux limitus deformity     Assessment:  Possibility for trauma of the right foot with possibility for fracture of the metatarsal shaft     Plan:  H&P condition reviewed and at this time I recommended cast immobilization and placed in the air fracture walker.  I reviewed the fracture and the fact that it ultimately may require surgery if it does not heal but we are going to try immobilization first  X-ray indicates that the patient has a fracture of the base of the second metatarsal right.  The patient's first MPJ pins are in place and joint is functioning well clinically with mild restriction noted on radiographic appearance

## 2017-06-04 DIAGNOSIS — M47817 Spondylosis without myelopathy or radiculopathy, lumbosacral region: Secondary | ICD-10-CM | POA: Diagnosis not present

## 2017-06-04 DIAGNOSIS — G894 Chronic pain syndrome: Secondary | ICD-10-CM | POA: Diagnosis not present

## 2017-06-04 DIAGNOSIS — M47812 Spondylosis without myelopathy or radiculopathy, cervical region: Secondary | ICD-10-CM | POA: Diagnosis not present

## 2017-06-04 DIAGNOSIS — M961 Postlaminectomy syndrome, not elsewhere classified: Secondary | ICD-10-CM | POA: Diagnosis not present

## 2017-06-11 ENCOUNTER — Encounter: Payer: Self-pay | Admitting: Podiatry

## 2017-06-11 ENCOUNTER — Ambulatory Visit (INDEPENDENT_AMBULATORY_CARE_PROVIDER_SITE_OTHER): Payer: BLUE CROSS/BLUE SHIELD | Admitting: Podiatry

## 2017-06-11 ENCOUNTER — Ambulatory Visit (INDEPENDENT_AMBULATORY_CARE_PROVIDER_SITE_OTHER): Payer: BLUE CROSS/BLUE SHIELD

## 2017-06-11 DIAGNOSIS — M2021 Hallux rigidus, right foot: Secondary | ICD-10-CM | POA: Diagnosis not present

## 2017-06-11 DIAGNOSIS — S92301A Fracture of unspecified metatarsal bone(s), right foot, initial encounter for closed fracture: Secondary | ICD-10-CM

## 2017-06-11 NOTE — Progress Notes (Signed)
Subjective:   Patient ID: [name], female   DOB: 52 y.o.   MRN: [id number]   HPI Patient presents stating that the right foot still is sore and it is still swollen and has made mild improvement but still is quite symptomatic   ROS      Objective:  Physical Exam  Neurovascular status intact with edema in the right forefoot around the metatarsal proximal shaft second that is present     Assessment:  Low-grade fracture of the second metatarsal right base which hopefully is healing but too early to be able to make a complete determination     Plan:  Reviewed condition and recommended utilization of cam walker for stability.  Patient will be seen back 4 weeks and if it does not improve at that point we may need to consider CT scan to make sure that there is no nonunion or complete delayed union at this  X-ray indicates are still quite a bit healing to go of the proximal portion of the second metatarsal shaft and especially the medial side still has some healing to go

## 2017-06-14 DIAGNOSIS — Z87442 Personal history of urinary calculi: Secondary | ICD-10-CM | POA: Diagnosis not present

## 2017-06-14 DIAGNOSIS — R109 Unspecified abdominal pain: Secondary | ICD-10-CM | POA: Diagnosis not present

## 2017-06-23 DIAGNOSIS — F410 Panic disorder [episodic paroxysmal anxiety] without agoraphobia: Secondary | ICD-10-CM | POA: Diagnosis not present

## 2017-06-23 DIAGNOSIS — F3342 Major depressive disorder, recurrent, in full remission: Secondary | ICD-10-CM | POA: Diagnosis not present

## 2017-06-25 DIAGNOSIS — L820 Inflamed seborrheic keratosis: Secondary | ICD-10-CM | POA: Diagnosis not present

## 2017-06-25 DIAGNOSIS — L308 Other specified dermatitis: Secondary | ICD-10-CM | POA: Diagnosis not present

## 2017-07-09 ENCOUNTER — Ambulatory Visit (INDEPENDENT_AMBULATORY_CARE_PROVIDER_SITE_OTHER): Payer: BLUE CROSS/BLUE SHIELD

## 2017-07-09 ENCOUNTER — Encounter: Payer: Self-pay | Admitting: Podiatry

## 2017-07-09 ENCOUNTER — Ambulatory Visit: Payer: BLUE CROSS/BLUE SHIELD | Admitting: Podiatry

## 2017-07-09 DIAGNOSIS — S92301G Fracture of unspecified metatarsal bone(s), right foot, subsequent encounter for fracture with delayed healing: Secondary | ICD-10-CM

## 2017-07-09 NOTE — Progress Notes (Signed)
Subjective:   Patient ID: [name], female   DOB: 52 y.o.   MRN: [id number]   HPI Patient presents stating the foot seems to gradually be getting better but it still quite sore now mostly wearing the boot at this time   ROS      Objective:  Physical Exam  Neurovascular status intact negative Homans sign noted with patient's right midfoot still showing swelling but improved from a month ago.  Still sore with palpation     Assessment:  Fracture base the second metatarsal which appears to be gradually healing     Plan:  H&P x-rays reviewed and today she will gradually increase her activity levels be seen back in 1 month and I do not recommend currently CT scan unless it is persistently sore and swollen over the next month or 2.  X-ray indicates that the base of the second metatarsal right appears to be healing at the current time

## 2017-07-27 DIAGNOSIS — E559 Vitamin D deficiency, unspecified: Secondary | ICD-10-CM | POA: Diagnosis not present

## 2017-07-27 DIAGNOSIS — E039 Hypothyroidism, unspecified: Secondary | ICD-10-CM | POA: Diagnosis not present

## 2017-07-27 DIAGNOSIS — R7989 Other specified abnormal findings of blood chemistry: Secondary | ICD-10-CM | POA: Diagnosis not present

## 2017-07-27 DIAGNOSIS — M1810 Unilateral primary osteoarthritis of first carpometacarpal joint, unspecified hand: Secondary | ICD-10-CM | POA: Diagnosis not present

## 2017-07-27 DIAGNOSIS — J208 Acute bronchitis due to other specified organisms: Secondary | ICD-10-CM | POA: Diagnosis not present

## 2017-07-27 DIAGNOSIS — B9689 Other specified bacterial agents as the cause of diseases classified elsewhere: Secondary | ICD-10-CM | POA: Diagnosis not present

## 2017-08-03 DIAGNOSIS — M47817 Spondylosis without myelopathy or radiculopathy, lumbosacral region: Secondary | ICD-10-CM | POA: Diagnosis not present

## 2017-08-03 DIAGNOSIS — Z79891 Long term (current) use of opiate analgesic: Secondary | ICD-10-CM | POA: Diagnosis not present

## 2017-08-03 DIAGNOSIS — M961 Postlaminectomy syndrome, not elsewhere classified: Secondary | ICD-10-CM | POA: Diagnosis not present

## 2017-08-03 DIAGNOSIS — M47812 Spondylosis without myelopathy or radiculopathy, cervical region: Secondary | ICD-10-CM | POA: Diagnosis not present

## 2017-08-03 DIAGNOSIS — G894 Chronic pain syndrome: Secondary | ICD-10-CM | POA: Diagnosis not present

## 2017-08-08 ENCOUNTER — Ambulatory Visit: Payer: BLUE CROSS/BLUE SHIELD | Admitting: Podiatry

## 2017-08-15 ENCOUNTER — Ambulatory Visit (INDEPENDENT_AMBULATORY_CARE_PROVIDER_SITE_OTHER): Payer: BLUE CROSS/BLUE SHIELD | Admitting: Podiatry

## 2017-08-15 ENCOUNTER — Encounter: Payer: Self-pay | Admitting: Podiatry

## 2017-08-15 ENCOUNTER — Ambulatory Visit (INDEPENDENT_AMBULATORY_CARE_PROVIDER_SITE_OTHER): Payer: BLUE CROSS/BLUE SHIELD

## 2017-08-15 DIAGNOSIS — L600 Ingrowing nail: Secondary | ICD-10-CM | POA: Diagnosis not present

## 2017-08-15 DIAGNOSIS — S92301G Fracture of unspecified metatarsal bone(s), right foot, subsequent encounter for fracture with delayed healing: Secondary | ICD-10-CM

## 2017-08-16 DIAGNOSIS — J0100 Acute maxillary sinusitis, unspecified: Secondary | ICD-10-CM | POA: Diagnosis not present

## 2017-08-17 NOTE — Progress Notes (Signed)
Subjective:   Patient ID: [name], female   DOB: 52 y.o.   MRN: [id number]   HPI Patient states that her foot is starting to feel better but that there is still some swelling if she does a lot of walking in her big toenails can be bothersome at times also   ROS      Objective:  Physical Exam  Neurovascular status intact with discomfort still base second metatarsal right with improvement present but discomfort still upon deep palpation with nail disease bilateral with incurvation of the beds     Assessment:  Fracture base of second metatarsal right with gradual healing and improvement with nail disease     Plan:  H&P condition reviewed and for the fracture I recommended ice therapy and gradual reduction of immobilization.  For the nails I advised on trimming which was performed today and discussed permanent procedure at one point future if the spicules were bothersome

## 2017-09-04 DIAGNOSIS — F341 Dysthymic disorder: Secondary | ICD-10-CM | POA: Diagnosis not present

## 2017-09-04 DIAGNOSIS — F411 Generalized anxiety disorder: Secondary | ICD-10-CM | POA: Diagnosis not present

## 2017-09-04 DIAGNOSIS — F605 Obsessive-compulsive personality disorder: Secondary | ICD-10-CM | POA: Diagnosis not present

## 2017-09-10 DIAGNOSIS — F605 Obsessive-compulsive personality disorder: Secondary | ICD-10-CM | POA: Diagnosis not present

## 2017-09-10 DIAGNOSIS — F341 Dysthymic disorder: Secondary | ICD-10-CM | POA: Diagnosis not present

## 2017-09-10 DIAGNOSIS — F411 Generalized anxiety disorder: Secondary | ICD-10-CM | POA: Diagnosis not present

## 2017-09-18 DIAGNOSIS — F605 Obsessive-compulsive personality disorder: Secondary | ICD-10-CM | POA: Diagnosis not present

## 2017-09-18 DIAGNOSIS — F341 Dysthymic disorder: Secondary | ICD-10-CM | POA: Diagnosis not present

## 2017-09-18 DIAGNOSIS — F411 Generalized anxiety disorder: Secondary | ICD-10-CM | POA: Diagnosis not present

## 2017-10-02 ENCOUNTER — Encounter: Payer: Self-pay | Admitting: Podiatry

## 2017-10-02 ENCOUNTER — Telehealth: Payer: Self-pay | Admitting: Podiatry

## 2017-10-02 DIAGNOSIS — F411 Generalized anxiety disorder: Secondary | ICD-10-CM | POA: Diagnosis not present

## 2017-10-02 DIAGNOSIS — F605 Obsessive-compulsive personality disorder: Secondary | ICD-10-CM | POA: Diagnosis not present

## 2017-10-02 DIAGNOSIS — F341 Dysthymic disorder: Secondary | ICD-10-CM | POA: Diagnosis not present

## 2017-10-02 NOTE — Telephone Encounter (Signed)
I'm requesting a copy of my x-rays to a disc to take for an appointment I have scheduled on Wednesday 10 October 2017. Please e-mail the form to me to the e-mail address of [email or web address]. Thank you.

## 2017-10-02 NOTE — Progress Notes (Signed)
Medical records release form requested by patient was e-mailed to her at the e-mail address of [email or web address] for pt to fill out and sign to obtain a disc of her x-rays.

## 2017-10-04 ENCOUNTER — Telehealth: Payer: Self-pay | Admitting: Podiatry

## 2017-10-04 NOTE — Telephone Encounter (Signed)
I called the patient to let her know that her requested medical records were ready for her to pick up at her convenience at the front desk. I reminded her that there is a $5.00 charge for the disc of x-rays. I told her our office hours tomorrow are 7:30 am - 4:00 pm.

## 2017-10-05 DIAGNOSIS — G894 Chronic pain syndrome: Secondary | ICD-10-CM | POA: Diagnosis not present

## 2017-10-05 DIAGNOSIS — M961 Postlaminectomy syndrome, not elsewhere classified: Secondary | ICD-10-CM | POA: Diagnosis not present

## 2017-10-05 DIAGNOSIS — M47817 Spondylosis without myelopathy or radiculopathy, lumbosacral region: Secondary | ICD-10-CM | POA: Diagnosis not present

## 2017-10-05 DIAGNOSIS — M47812 Spondylosis without myelopathy or radiculopathy, cervical region: Secondary | ICD-10-CM | POA: Diagnosis not present

## 2017-10-10 DIAGNOSIS — M79671 Pain in right foot: Secondary | ICD-10-CM | POA: Diagnosis not present

## 2017-10-10 DIAGNOSIS — S92324A Nondisplaced fracture of second metatarsal bone, right foot, initial encounter for closed fracture: Secondary | ICD-10-CM | POA: Diagnosis not present

## 2017-10-16 ENCOUNTER — Other Ambulatory Visit: Payer: Self-pay | Admitting: Orthopedic Surgery

## 2017-10-16 ENCOUNTER — Ambulatory Visit
Admission: RE | Admit: 2017-10-16 | Discharge: 2017-10-16 | Disposition: A | Discharge status: Home / Self Care | Payer: BLUE CROSS/BLUE SHIELD | Source: Ambulatory Visit | Attending: Orthopedic Surgery | Admitting: Orthopedic Surgery

## 2017-10-16 DIAGNOSIS — S92324A Nondisplaced fracture of second metatarsal bone, right foot, initial encounter for closed fracture: Secondary | ICD-10-CM

## 2017-10-16 DIAGNOSIS — S92322D Displaced fracture of second metatarsal bone, left foot, subsequent encounter for fracture with routine healing: Secondary | ICD-10-CM | POA: Diagnosis not present

## 2017-10-18 DIAGNOSIS — M1712 Unilateral primary osteoarthritis, left knee: Secondary | ICD-10-CM | POA: Diagnosis not present

## 2017-10-18 DIAGNOSIS — M25361 Other instability, right knee: Secondary | ICD-10-CM | POA: Diagnosis not present

## 2017-10-30 DIAGNOSIS — S92324K Nondisplaced fracture of second metatarsal bone, right foot, subsequent encounter for fracture with nonunion: Secondary | ICD-10-CM | POA: Diagnosis not present

## 2017-11-14 ENCOUNTER — Telehealth: Payer: Self-pay | Admitting: Podiatry

## 2017-11-14 NOTE — Telephone Encounter (Signed)
This is Aeronautical engineer with [employer] representing the bone growth stimulators. I'm calling because [name] is seeing a new doctor who has ordered a bone growth stimulator for a foot injury. I'm calling to get her original medical records from where she saw Dr. Charlsie Merles previously and when she initially injured her foot. If you would give me a call back at [phone] with any questions. Thank you and have a good day.

## 2017-11-14 NOTE — Telephone Encounter (Signed)
I called [name] and asked if he had a signed medical records release form where pt signed authorizing [place] to release records. Scott said no and that was one reason he was calling. I told him if he could get the pt to sign a release form with the new providers office then he could fax to [place] at [phone] and we would take care of that request.

## 2017-11-21 DIAGNOSIS — S92321K Displaced fracture of second metatarsal bone, right foot, subsequent encounter for fracture with nonunion: Secondary | ICD-10-CM | POA: Diagnosis not present

## 2017-11-28 DIAGNOSIS — M47816 Spondylosis without myelopathy or radiculopathy, lumbar region: Secondary | ICD-10-CM | POA: Diagnosis not present

## 2017-12-04 DIAGNOSIS — F605 Obsessive-compulsive personality disorder: Secondary | ICD-10-CM | POA: Diagnosis not present

## 2017-12-04 DIAGNOSIS — F341 Dysthymic disorder: Secondary | ICD-10-CM | POA: Diagnosis not present

## 2017-12-04 DIAGNOSIS — F411 Generalized anxiety disorder: Secondary | ICD-10-CM | POA: Diagnosis not present

## 2017-12-10 DIAGNOSIS — M961 Postlaminectomy syndrome, not elsewhere classified: Secondary | ICD-10-CM | POA: Diagnosis not present

## 2017-12-10 DIAGNOSIS — M47812 Spondylosis without myelopathy or radiculopathy, cervical region: Secondary | ICD-10-CM | POA: Diagnosis not present

## 2017-12-10 DIAGNOSIS — G894 Chronic pain syndrome: Secondary | ICD-10-CM | POA: Diagnosis not present

## 2017-12-10 DIAGNOSIS — M47817 Spondylosis without myelopathy or radiculopathy, lumbosacral region: Secondary | ICD-10-CM | POA: Diagnosis not present

## 2017-12-18 NOTE — H&P (Signed)
TOTAL KNEE REVISION ADMISSION H&P  Patient is being admitted for right revision total knee arthroplasty.  Subjective:  Chief Complaint:Right knee malalignment and instability  HPI: The patient is a 52 year old female who presents for pre-operative visit in preparation for their right total knee arthroplasty revision, which is scheduled on 01/09/2018 with Dr. Lequita Halt at [facility]. The patient has history of right total knee arthroplasty and right knee polyethylene revision. She has a significant valgus deformity, as well as gross instability in the right knee, which has impacted her quality of life and ability to do activities of daily living. The patient currently has a diagnosis of unstable right total knee arthroplasty and has failed conservative treatments including physical therapy, activity modification, and use of assistive devices. X-rays of the right knee demonstrate an Attune knee with an alignment that is approximately 15 degrees of valgus. The malalignment is mainly coming from the tibial side. The patient denies an active infection.  Patient Active Problem List   Diagnosis Date Noted  . OA (osteoarthritis) of knee 12/13/2015  . Obese 09/01/2014  . 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 S/P revision of total knee 08/31/2014  . S/P right TKA 01/13/2014  . S/P knee replacement 01/13/2014  . Acute bronchitis 04/28/2013  . Chest pain 04/28/2013  . Chronic cough 02/02/2013  . Hypoxia in the setting of recent bronchitis and cough 06/07/2012  . Toxic encephalopathy 06/07/2012  . Leukocytosis 07/27/2011  . Metabolic encephalopathy 07/27/2011  . UTI (lower urinary tract infection) 07/27/2011  . Hx of pancreatitis 07/27/2011  . Anemia 07/27/2011  . Hypothyroid 07/27/2011  . Depression 07/27/2011  . GERD (gastroesophageal reflux disease) 07/27/2011  . Pancreatic pseudocyst 07/27/2011   Past Medical History:  Diagnosis Date  . Anemia   . Anxiety   . Bronchitis    hx of     . Cervical pain (neck)   . Chronic pancreatitis (HCC)   . Constipation   . DDD (degenerative disc disease), cervical   . Depression   . GERD (gastroesophageal reflux disease)   . History of kidney stones   . History of pancreatitis 01-04-2011  . Hypothyroidism   . IBS (irritable bowel syndrome)   . Nausea DUE TO PAIN FROM KIDNEY STONE  . Pancreatic pseudocyst   . Pneumonia    hx of pneumonia   . PONV (postoperative nausea and vomiting)   . Right flank pain   . Right knee pain   . Right ureteral stone   . Swelling of right knee joint   . Urinary tract infection    hx of     Past Surgical History:  Procedure Laterality Date  . ABDOMINAL HYSTERECTOMY    . ANAL RECTAL MANOMETRY N/A 09/04/2016   Procedure: ANO RECTAL MANOMETRY;  Surgeon: Willis Modena, MD;  Location: [facility];  Service: Endoscopy;  Laterality: N/A;  . ANTERIOR LUMBAR FUSION  11-09-2010    L5 - S1  . BACK SURGERY  2012   lower back surgery   . CERVICAL FUSION  1999   C4 - 7  . CERVICAL SPINE SURGERY  APRIL 2011   REMOVAL SCAR TISSUE  . colonscopy     . CYSTO/ RIGHT RETROGRADE PYELOGRAM/ RIGHT URETEROSCOPY AND STENT PLACEMENT  09-24-2009   HX RIGHT URETERAL CALCULUS  . CYSTOSCOPY WITH URETEROSCOPY  12/20/2011   Procedure: CYSTOSCOPY WITH URETEROSCOPY;  Surgeon: Milford Cage, MD;  Location: [facility];  Service: Urology;  Laterality: Right;  . DIAGNOSTIC  LAPAROSCOPY  1992   ENDOMETRIOSIS  . EUS  03/01/2011   Procedure: UPPER ENDOSCOPIC ULTRASOUND (EUS) LINEAR;  Surgeon: Freddy Jaksch, MD;  Location: [facility];  Service: Endoscopy;  Laterality: N/A;  . FINE NEEDLE ASPIRATION  03/01/2011   Procedure: FINE NEEDLE ASPIRATION (FNA) LINEAR;  Surgeon: Freddy Jaksch, MD;  Location: [facility];  Service: Endoscopy;  Laterality: N/A;  . HERNIA REPAIR  AS CHILD  . JOINT REPLACEMENT    . KNEE ARTHROSCOPY  AUG 2013   RIGHT KNEE  . LAPAROSCOPIC ASSISTED VAGINAL HYSTERECTOMY  1997   . LAPAROSCOPIC CHOLECYSTECTOMY  01-09-2011  . ROBOTIC-ASSISTED RIGHT SALPINGO-OOPHORECTOMY W/ LYSIS ADHESIONS  12-21-2009   HX ENDOMETRIOSIS AND PELVIC PAIN  . TONSILLECTOMY  AS CHILD  . TOTAL KNEE ARTHROPLASTY Right 01/13/2014   Procedure: RIGHT TOTAL KNEE ARTHROPLASTY;  Surgeon: Shelda Pal, MD;  Location: [facility];  Service: Orthopedics;  Laterality: Right;  . TOTAL KNEE ARTHROPLASTY Left 12/13/2015   Procedure: LEFT TOTAL KNEE ARTHROPLASTY;  Surgeon: Ollen Gross, MD;  Location: [facility];  Service: Orthopedics;  Laterality: Left;  . TOTAL KNEE REVISION Right 08/31/2014   Procedure: RIGHT TOTAL KNEE ARTHROPLASTY RESECTION;  Surgeon: Durene Romans, MD;  Location: [facility];  Service: Orthopedics;  Laterality: Right;  . URETEROSCOPIC STONE EXTRACTIONS     X3    No current facility-administered medications for this encounter.    Current Outpatient Medications  Medication Sig Dispense Refill Last Dose  . albuterol (PROVENTIL HFA;VENTOLIN HFA) 108 (90 BASE) MCG/ACT inhaler Inhale 2 puffs into the lungs every 6 (six) hours as needed for wheezing.   Taking  . ALPRAZolam (XANAX) 1 MG tablet Take 1 mg by mouth 3 (three) times daily.    Taking  . BELBUCA 150 MCG FILM Take 150 mcg by mouth 2 (two) times daily.   Taking  . buPROPion (WELLBUTRIN XL) 300 MG 24 hr tablet Take 300 mg by mouth every morning.    Taking  . ciprofloxacin (CIPRO) 500 MG tablet Take 1 tablet (500 mg total) by mouth 2 (two) times daily. One po bid x 7 days 14 tablet 0 Taking  . diclofenac sodium (VOLTAREN) 1 % GEL Apply 4 (four) times daily topically.   Taking  . docusate sodium (COLACE) 100 MG capsule Take 1 capsule (100 mg total) by mouth 2 (two) times daily. (Patient taking differently: Take 200 mg by mouth at bedtime as needed for moderate constipation. ) 10 capsule 0 Taking  . DULoxetine (CYMBALTA) 60 MG capsule Take 60 mg by mouth 2 (two) times daily.   Taking  . hydrochlorothiazide (HYDRODIURIL) 25 MG tablet Take 25 mg by  mouth daily with lunch.    Taking  . hydrocortisone-pramoxine (PROCTOFOAM-HC) rectal foam Place 1 applicator rectally 2 (two) times daily as needed for hemorrhoids.   Taking  . levothyroxine (SYNTHROID, LEVOTHROID) 125 MCG tablet Take 125 mcg by mouth daily before breakfast.   Taking  . lipase/protease/amylase (CREON) 12000 units CPEP capsule Take 36,000 Units by mouth.    Taking  . Meth-Hyo-M Bl-Na Phos-Ph Sal (URIBEL) 118 MG CAPS Take 118 mg by mouth 3 (three) times daily.   Taking  . methocarbamol (ROBAXIN) 500 MG tablet Take 1 tablet (500 mg total) by mouth every 6 (six) hours as needed for muscle spasms. (Patient taking differently: Take 500 mg by mouth. ) 80 tablet 0 Taking  . metroNIDAZOLE (FLAGYL) 500 MG tablet Take 1 tablet (500 mg total) by mouth 2 (two) times daily. One po bid  x 7 days 14 tablet 0 Taking  . nitrofurantoin, macrocrystal-monohydrate, (MACROBID) 100 MG capsule Take 100 mg by mouth daily.   Taking  . omeprazole (PRILOSEC) 20 MG capsule Take 20 mg by mouth daily with lunch.    Taking  . ondansetron (ZOFRAN ODT) 4 MG disintegrating tablet Take 1 tablet (4 mg total) by mouth every 8 (eight) hours as needed for nausea or vomiting. 20 tablet 0 Taking  . ondansetron (ZOFRAN) 4 MG tablet Take 4 mg by mouth every 8 (eight) hours as needed for nausea or vomiting.   Taking  . oxyCODONE 10 MG TABS Take 1-2 tablets (10-20 mg total) by mouth every 3 (three) hours as needed for moderate pain or severe pain. (Patient taking differently: Take 10-20 mg by mouth 3 (three) times daily. ) 90 tablet 0 Taking  . oxyCODONE-acetaminophen (PERCOCET) 10-325 MG tablet Take 1 tablet by mouth every 4 (four) hours as needed for pain.   Taking  . pentosan polysulfate (ELMIRON) 100 MG capsule Take 100 mg by mouth 3 (three) times daily.   Taking  . polycarbophil (FIBERCON) 625 MG tablet Take 1,250 mg by mouth 2 (two) times daily.   Taking  . polyethylene glycol (MIRALAX / GLYCOLAX) packet Take 17 g by mouth  daily as needed for moderate constipation.    Taking  . pregabalin (LYRICA) 300 MG capsule Take 300 mg by mouth 2 (two) times daily.   Taking  . promethazine (PHENERGAN) 25 MG tablet Take 1 tablet (25 mg total) by mouth 3 (three) times daily as needed for nausea. (Patient taking differently: Take 25 mg by mouth every 6 (six) hours as needed for nausea. ) 30 tablet 0 Taking  . sodium chloride (OCEAN) 0.65 % SOLN nasal spray Place 1 spray into both nostrils daily as needed for congestion. After flonase.   Taking  . Tamsulosin HCl (FLOMAX) 0.4 MG CAPS Take 0.4 mg by mouth daily with lunch.    Taking  . Vitamin D, Ergocalciferol, (DRISDOL) 50000 units CAPS capsule Take 50,000 Units by mouth.   Taking   Facility-Administered Medications Ordered in Other Encounters  Medication Dose Route Frequency Provider Last Rate Last Dose  . vancomycin (VANCOCIN) 1,000 mg in sodium chloride 0.9 % 500 mL IVPB  1,000 mg Intravenous Once Perkins, Alexzandrew L, PA-C       Allergies  Allergen Reactions  . Deltasone [Prednisone] Other (See Comments)    Puts her into a coma. Was admitted to ICU for it.  . Methylprednisolone Sodium Succ Other (See Comments) and Shortness Of Breath    Sob, Ams Sob, Ams   . Solu-Medrol [Methylprednisolone] Shortness Of Breath and Other (See Comments)    Sob, Ams  . Ambien [Zolpidem Tartrate]   . Fentanyl   . Septra Ds [Sulfamethoxazole-Trimethoprim]   . Zolpidem   . Dilaudid [Hydromorphone Hcl] Other (See Comments)    Hallucinations  . Zolpidem Tartrate Other (See Comments)    hallucinations    Social History   Tobacco Use  . Smoking status: Never Smoker  . Smokeless tobacco: Never Used  Substance Use Topics  . Alcohol use: No    Family History  Problem Relation Age of Onset  . Constipation Mother   . Cancer Father   . Fibromyalgia Sister       Review of Systems  Constitutional: Negative for chills and fever.  HENT: Negative for congestion, sore throat and  tinnitus.   Eyes: Negative for double vision, photophobia and pain.  Respiratory: Negative  for cough, shortness of breath and wheezing.   Cardiovascular: Negative for chest pain, palpitations and orthopnea.  Gastrointestinal: Negative for heartburn, nausea and vomiting.  Genitourinary: Negative for dysuria, frequency and urgency.  Musculoskeletal: Positive for joint pain.  Neurological: Negative for dizziness, weakness and headaches.     Objective:  Physical Exam  Well nourished and well developed. General: Alert and oriented x3, cooperative and pleasant, no acute distress. Head: normocephalic, atraumatic, neck supple. Eyes: EOMI. Respiratory: breath sounds clear in all fields, no wheezing, rales, or rhonchi. Cardiovascular: Regular rate and rhythm, no murmurs, gallops or rubs. Abdomen: non-tender to palpation and soft, normoactive bowel sounds. Musculoskeletal: Right knee exam: No swelling. Significant valgus alignment of approximately 15 degrees. She does have some pseudo-laxity where I can correct her back to approximately 8-10 degrees with varus stressing. Range of motion: 0 - 125 degrees. Severe AP laxity during flexion. Left knee exam: No swelling. Range of motion: 0 - 130 degrees. Knee has excellent alignment. Knee is stable and nontender. Calves soft and nontender. Motor function intact in LE. Strength 5/5 LE bilaterally. Neuro: Distal pulses 2+. Sensation to light touch intact in LE.  Vital signs in last 24 hours: Blood pressure: 104/76 mmHg Pulse: 64 bpm   Labs:  Estimated body mass index is 38.62 kg/m as calculated from the following:   Height as of 09/04/16: 5\' 4"  (1.626 m).   Weight as of 06/25/16: 102.1 kg.  Imaging Review X-rays of the right knee demonstrate an Attune knee with an alignment that is approximately 15 degrees of valgus. The malalignment is mainly coming from the tibial side.   Preoperative templating of the joint replacement has been completed,  documented, and submitted to the Operating Room personnel in order to optimize intra-operative equipment management.   Assessment/Plan:  End stage arthritis, right knee(s) with failed previous arthroplasty.   The patient history, physical examination, clinical judgment of the provider and imaging studies are consistent with end stage degenerative joint disease of the right knee(s), previous total knee arthroplasty. Revision total knee arthroplasty is deemed medically necessary. The treatment options including medical management, injection therapy, arthroscopy and revision arthroplasty were discussed at length. The risks and benefits of revision total knee arthroplasty were presented and reviewed. The risks due to aseptic loosening, infection, stiffness, patella tracking problems, thromboembolic complications and other imponderables were discussed. The patient acknowledged the explanation, agreed to proceed with the plan and consent was signed. Patient is being admitted for inpatient treatment for surgery, pain control, PT, OT, prophylactic antibiotics, VTE prophylaxis, progressive ambulation and ADL's and discharge planning.The patient is planning to be discharged home with home health services.  Therapy Plans: HHPT then outpatient therapy Disposition: Home with husband Planned DVT Prophylaxis: Aspirin 325 mg BID DME needed: None PCP: Dr. Corliss Blacker [facility]) TXA: IV Allergies: Dilaudid (hallucinations), prednisone Anesthesia Concerns: Nausea Other: Post-operative pain management will be complicated by chronic opioid use.  Patient reports history of coma following high-dose steroids, will remove dexamethasone from pre-op orders.  - Patient was instructed on what medications to stop prior to surgery. - Follow-up visit in 2 weeks with Dr. Lequita Halt - Begin physical therapy following surgery - Pre-operative lab work as pre-surgical testing - Prescriptions will be provided in hospital at time  of discharge  Arther Abbott, PA-C Orthopedic Surgery [facility] [place]

## 2017-12-22 DIAGNOSIS — F341 Dysthymic disorder: Secondary | ICD-10-CM | POA: Diagnosis not present

## 2017-12-22 DIAGNOSIS — F410 Panic disorder [episodic paroxysmal anxiety] without agoraphobia: Secondary | ICD-10-CM | POA: Diagnosis not present

## 2017-12-25 DIAGNOSIS — Z01818 Encounter for other preprocedural examination: Secondary | ICD-10-CM | POA: Diagnosis not present

## 2017-12-25 DIAGNOSIS — E039 Hypothyroidism, unspecified: Secondary | ICD-10-CM | POA: Diagnosis not present

## 2017-12-25 DIAGNOSIS — Z23 Encounter for immunization: Secondary | ICD-10-CM | POA: Diagnosis not present

## 2017-12-25 DIAGNOSIS — E559 Vitamin D deficiency, unspecified: Secondary | ICD-10-CM | POA: Diagnosis not present

## 2017-12-25 DIAGNOSIS — M25561 Pain in right knee: Secondary | ICD-10-CM | POA: Diagnosis not present

## 2017-12-29 DIAGNOSIS — S8262XA Displaced fracture of lateral malleolus of left fibula, initial encounter for closed fracture: Secondary | ICD-10-CM

## 2017-12-29 HISTORY — DX: Displaced fracture of lateral malleolus of left fibula, initial encounter for closed fracture: S82.62XA

## 2017-12-31 DIAGNOSIS — M84375A Stress fracture, left foot, initial encounter for fracture: Secondary | ICD-10-CM | POA: Diagnosis not present

## 2017-12-31 DIAGNOSIS — S99912A Unspecified injury of left ankle, initial encounter: Secondary | ICD-10-CM | POA: Diagnosis not present

## 2018-01-01 DIAGNOSIS — M79672 Pain in left foot: Secondary | ICD-10-CM | POA: Diagnosis not present

## 2018-01-01 DIAGNOSIS — S8262XA Displaced fracture of lateral malleolus of left fibula, initial encounter for closed fracture: Secondary | ICD-10-CM | POA: Diagnosis not present

## 2018-01-01 DIAGNOSIS — S92354A Nondisplaced fracture of fifth metatarsal bone, right foot, initial encounter for closed fracture: Secondary | ICD-10-CM | POA: Diagnosis not present

## 2018-01-01 DIAGNOSIS — M25572 Pain in left ankle and joints of left foot: Secondary | ICD-10-CM | POA: Diagnosis not present

## 2018-01-02 NOTE — Patient Instructions (Signed)
[name] [facility]  01/02/2018   Your procedure is scheduled on: 01-09-18   Report to [facility] Main  Entrance    Report to admitting at 11:00AM    Call this number if you have problems the morning of surgery [phone]     Remember: Do not eat food After Midnight. YOU MAY HAVE CLEAR LIQUIDS FROM MIDNIGHT UNTIL 7:30AM. NOTHING BY MOUTH AFTER 7:30AM! BRUSH YOUR TEETH MORNING OF SURGERY AND RINSE YOUR MOUTH OUT, NO CHEWING GUM CANDY OR MINTS.      CLEAR LIQUID DIET   Foods Allowed                                                                     Foods Excluded  Coffee and tea, regular and decaf                             liquids that you cannot  Plain Jell-O in any flavor                                             see through such as: Fruit ices (not with fruit pulp)                                     milk, soups, orange juice  Iced Popsicles                                    All solid food Carbonated beverages, regular and diet                                    Cranberry, grape and apple juices Sports drinks like Gatorade Lightly seasoned clear broth or consume(fat free) Sugar, honey syrup  Sample Menu Breakfast                                Lunch                                     Supper Cranberry juice                    Beef broth                            Chicken broth Jell-O                                     Grape juice  Apple juice Coffee or tea                        Jell-O                                      Popsicle                                                Coffee or tea                        Coffee or tea  _____________________________________________________________________       Take these medicines the morning of surgery with A SIP OF WATER: duloxetine, levothyroxine, xanax If needed, albuterol inhaler if needed                                You may not have any metal on your body  including hair pins and              piercings  Do not wear jewelry, make-up, lotions, powders or perfumes, deodorant             Do not wear nail polish.  Do not shave  48 hours prior to surgery.        Do not bring valuables to the hospital. [place] IS NOT             RESPONSIBLE   FOR VALUABLES.  Contacts, dentures or bridgework may not be worn into surgery.  Leave suitcase in the car. After surgery it may be brought to your room.                 Please read over the following fact sheets you were given: _____________________________________________________________________             [facility] - Preparing for Surgery Before surgery, you can play an important role.  Because skin is not sterile, your skin needs to be as free of germs as possible.  You can reduce the number of germs on your skin by washing with CHG (chlorahexidine gluconate) soap before surgery.  CHG is an antiseptic cleaner which kills germs and bonds with the skin to continue killing germs even after washing. Please DO NOT use if you have an allergy to CHG or antibacterial soaps.  If your skin becomes reddened/irritated stop using the CHG and inform your nurse when you arrive at Short Stay. Do not shave (including legs and underarms) for at least 48 hours prior to the first CHG shower.  You may shave your face/neck. Please follow these instructions carefully:  1.  Shower with CHG Soap the night before surgery and the  morning of Surgery.  2.  If you choose to wash your hair, wash your hair first as usual with your  normal  shampoo.  3.  After you shampoo, rinse your hair and body thoroughly to remove the  shampoo.                           4.  Use CHG as you would any other liquid soap.  You can  apply chg directly  to the skin and wash                       Gently with a scrungie or clean washcloth.  5.  Apply the CHG Soap to your body ONLY FROM THE NECK DOWN.   Do not use on face/ open                            Wound or open sores. Avoid contact with eyes, ears mouth and genitals (private parts).                       Wash face,  Genitals (private parts) with your normal soap.             6.  Wash thoroughly, paying special attention to the area where your surgery  will be performed.  7.  Thoroughly rinse your body with warm water from the neck down.  8.  DO NOT shower/wash with your normal soap after using and rinsing off  the CHG Soap.                9.  Pat yourself dry with a clean towel.            10.  Wear clean pajamas.            11.  Place clean sheets on your bed the night of your first shower and do not  sleep with pets. Day of Surgery : Do not apply any lotions/deodorants the morning of surgery.  Please wear clean clothes to the hospital/surgery center.  FAILURE TO FOLLOW THESE INSTRUCTIONS MAY RESULT IN THE CANCELLATION OF YOUR SURGERY PATIENT SIGNATURE_________________________________  NURSE SIGNATURE__________________________________  ________________________________________________________________________   [name]  An incentive spirometer is a tool that can help keep your lungs clear and active. This tool measures how well you are filling your lungs with each breath. Taking long deep breaths may help reverse or decrease the chance of developing breathing (pulmonary) problems (especially infection) following:  A long period of time when you are unable to move or be active. BEFORE THE PROCEDURE   If the spirometer includes an indicator to show your best effort, your nurse or respiratory therapist will set it to a desired goal.  If possible, sit up straight or lean slightly forward. Try not to slouch.  Hold the incentive spirometer in an upright position. INSTRUCTIONS FOR USE  1. Sit on the edge of your bed if possible, or sit up as far as you can in bed or on a chair. 2. Hold the incentive spirometer in an upright position. 3. Breathe out normally. 4. Place  the mouthpiece in your mouth and seal your lips tightly around it. 5. Breathe in slowly and as deeply as possible, raising the piston or the ball toward the top of the column. 6. Hold your breath for 3-5 seconds or for as long as possible. Allow the piston or ball to fall to the bottom of the column. 7. Remove the mouthpiece from your mouth and breathe out normally. 8. Rest for a few seconds and repeat Steps 1 through 7 at least 10 times every 1-2 hours when you are awake. Take your time and take a few normal breaths between deep breaths. 9. The spirometer may include an indicator to show your best effort. Use the indicator as a goal to work toward during  each repetition. 10. After each set of 10 deep breaths, practice coughing to be sure your lungs are clear. If you have an incision (the cut made at the time of surgery), support your incision when coughing by placing a pillow or rolled up towels firmly against it. Once you are able to get out of bed, walk around indoors and cough well. You may stop using the incentive spirometer when instructed by your caregiver.  RISKS AND COMPLICATIONS  Take your time so you do not get dizzy or light-headed.  If you are in pain, you may need to take or ask for pain medication before doing incentive spirometry. It is harder to take a deep breath if you are having pain. AFTER USE  Rest and breathe slowly and easily.  It can be helpful to keep track of a log of your progress. Your caregiver can provide you with a simple table to help with this. If you are using the spirometer at home, follow these instructions: [place] IF:   You are having difficultly using the spirometer.  You have trouble using the spirometer as often as instructed.  Your pain medication is not giving enough relief while using the spirometer.  You develop fever of 100.5 F (38.1 C) or higher. SEEK IMMEDIATE MEDICAL CARE IF:   You cough up bloody sputum that had not been  present before.  You develop fever of 102 F (38.9 C) or greater.  You develop worsening pain at or near the incision site. MAKE SURE YOU:   Understand these instructions.  Will watch your condition.  Will get help right away if you are not doing well or get worse. Document Released: 07/10/2006 Document Revised: 05/22/2011 Document Reviewed: 09/10/2006 Exit[employer] Patient Information 2014 [employer], [place].   ________________________________________________________________________  WHAT IS A BLOOD TRANSFUSION? Blood Transfusion Information  A transfusion is the replacement of blood or some of its parts. Blood is made up of multiple cells which provide different functions.  Red blood cells carry oxygen and are used for blood loss replacement.  White blood cells fight against infection.  Platelets control bleeding.  Plasma helps clot blood.  Other blood products are available for specialized needs, such as hemophilia or other clotting disorders. BEFORE THE TRANSFUSION  Who gives blood for transfusions?   Healthy volunteers who are fully evaluated to make sure their blood is safe. This is blood bank blood. Transfusion therapy is the safest it has ever been in the practice of medicine. Before blood is taken from a donor, a complete history is taken to make sure that person has no history of diseases nor engages in risky social behavior (examples are intravenous drug use or sexual activity with multiple partners). The donor's travel history is screened to minimize risk of transmitting infections, such as malaria. The donated blood is tested for signs of infectious diseases, such as HIV and hepatitis. The blood is then tested to be sure it is compatible with you in order to minimize the chance of a transfusion reaction. If you or a relative donates blood, this is often done in anticipation of surgery and is not appropriate for emergency situations. It takes many days to process the donated  blood. RISKS AND COMPLICATIONS Although transfusion therapy is very safe and saves many lives, the main dangers of transfusion include:   Getting an infectious disease.  Developing a transfusion reaction. This is an allergic reaction to something in the blood you were given. Every precaution is taken to prevent  this. The decision to have a blood transfusion has been considered carefully by your caregiver before blood is given. Blood is not given unless the benefits outweigh the risks. AFTER THE TRANSFUSION  Right after receiving a blood transfusion, you will usually feel much better and more energetic. This is especially true if your red blood cells have gotten low (anemic). The transfusion raises the level of the red blood cells which carry oxygen, and this usually causes an energy increase.  The nurse administering the transfusion will monitor you carefully for complications. HOME CARE INSTRUCTIONS  No special instructions are needed after a transfusion. You may find your energy is better. Speak with your caregiver about any limitations on activity for underlying diseases you may have. SEEK MEDICAL CARE IF:   Your condition is not improving after your transfusion.  You develop redness or irritation at the intravenous (IV) site. SEEK IMMEDIATE MEDICAL CARE IF:  Any of the following symptoms occur over the next 12 hours:  Shaking chills.  You have a temperature by mouth above 102 F (38.9 C), not controlled by medicine.  Chest, back, or muscle pain.  People around you feel you are not acting correctly or are confused.  Shortness of breath or difficulty breathing.  Dizziness and fainting.  You get a rash or develop hives.  You have a decrease in urine output.  Your urine turns a dark color or changes to pink, red, or brown. Any of the following symptoms occur over the next 10 days:  You have a temperature by mouth above 102 F (38.9 C), not controlled by  medicine.  Shortness of breath.  Weakness after normal activity.  The white part of the eye turns yellow (jaundice).  You have a decrease in the amount of urine or are urinating less often.  Your urine turns a dark color or changes to pink, red, or brown. Document Released: 02/25/2000 Document Revised: 05/22/2011 Document Reviewed: 10/14/2007 [facility] Patient Information 2014 [place], [place].  _______________________________________________________________________

## 2018-01-02 NOTE — Progress Notes (Signed)
Surgical clearance Dr Corliss Blacker 12-25-17 on chart

## 2018-01-04 ENCOUNTER — Encounter (HOSPITAL_COMMUNITY)
Admission: RE | Admit: 2018-01-04 | Discharge: 2018-01-04 | Disposition: A | Discharge status: Home / Self Care | Payer: BLUE CROSS/BLUE SHIELD | Source: Ambulatory Visit | Attending: Orthopedic Surgery | Admitting: Orthopedic Surgery

## 2018-01-04 ENCOUNTER — Encounter (HOSPITAL_COMMUNITY): Payer: Self-pay

## 2018-01-04 ENCOUNTER — Other Ambulatory Visit: Payer: Self-pay

## 2018-01-04 DIAGNOSIS — Z01812 Encounter for preprocedural laboratory examination: Secondary | ICD-10-CM | POA: Diagnosis not present

## 2018-01-04 HISTORY — DX: Other physeal fracture of right metatarsal, initial encounter for closed fracture: S99.191A

## 2018-01-04 HISTORY — DX: Displaced fracture of lateral malleolus of left fibula, initial encounter for closed fracture: S82.62XA

## 2018-01-04 LAB — CBC
HEMATOCRIT: 41.4 % (ref 36.0–46.0)
Hemoglobin: 12.5 g/dL (ref 12.0–15.0)
MCH: 25.6 pg — AB (ref 26.0–34.0)
MCHC: 30.2 g/dL (ref 30.0–36.0)
MCV: 84.7 fL (ref 80.0–100.0)
Platelets: 250 10*3/uL (ref 150–400)
RBC: 4.89 MIL/uL (ref 3.87–5.11)
RDW: 14.6 % (ref 11.5–15.5)
WBC: 6.4 10*3/uL (ref 4.0–10.5)
nRBC: 0 % (ref 0.0–0.2)

## 2018-01-04 LAB — COMPREHENSIVE METABOLIC PANEL
ALBUMIN: 3.9 g/dL (ref 3.5–5.0)
ALT: 14 U/L (ref 0–44)
ANION GAP: 7 (ref 5–15)
AST: 22 U/L (ref 15–41)
Alkaline Phosphatase: 93 U/L (ref 38–126)
BILIRUBIN TOTAL: 0.9 mg/dL (ref 0.3–1.2)
BUN: 14 mg/dL (ref 6–20)
CO2: 31 mmol/L (ref 22–32)
Calcium: 9.2 mg/dL (ref 8.9–10.3)
Chloride: 101 mmol/L (ref 98–111)
Creatinine, Ser: 0.95 mg/dL (ref 0.44–1.00)
Glucose, Bld: 102 mg/dL — ABNORMAL HIGH (ref 70–99)
Potassium: 3.5 mmol/L (ref 3.5–5.1)
Sodium: 139 mmol/L (ref 135–145)
TOTAL PROTEIN: 6.8 g/dL (ref 6.5–8.1)

## 2018-01-04 LAB — APTT: aPTT: 37 seconds — ABNORMAL HIGH (ref 24–36)

## 2018-01-04 LAB — SURGICAL PCR SCREEN
MRSA, PCR: NEGATIVE
STAPHYLOCOCCUS AUREUS: NEGATIVE

## 2018-01-04 LAB — PROTIME-INR
INR: 0.92
Prothrombin Time: 12.3 seconds (ref 11.4–15.2)

## 2018-01-08 MED ORDER — BUPIVACAINE LIPOSOME 1.3 % IJ SUSP
20.0000 mL | INTRAMUSCULAR | Status: DC
  Filled 2018-01-08: qty 20

## 2018-01-09 ENCOUNTER — Encounter (HOSPITAL_COMMUNITY): Payer: Self-pay

## 2018-01-09 ENCOUNTER — Inpatient Hospital Stay (HOSPITAL_COMMUNITY): Payer: BLUE CROSS/BLUE SHIELD | Admitting: Certified Registered Nurse Anesthetist

## 2018-01-09 ENCOUNTER — Other Ambulatory Visit: Payer: Self-pay

## 2018-01-09 ENCOUNTER — Telehealth (HOSPITAL_COMMUNITY): Payer: Self-pay | Admitting: *Deleted

## 2018-01-09 ENCOUNTER — Encounter (HOSPITAL_COMMUNITY)
Admission: RE | Disposition: A | Discharge status: Home Health | Payer: Self-pay | Source: Home / Self Care | Attending: Orthopedic Surgery

## 2018-01-09 ENCOUNTER — Inpatient Hospital Stay (HOSPITAL_COMMUNITY)
Admission: RE | Admit: 2018-01-09 | Discharge: 2018-01-11 | DRG: 467 | Disposition: A | Discharge status: Home Health | Payer: BLUE CROSS/BLUE SHIELD | Attending: Orthopedic Surgery | Admitting: Orthopedic Surgery

## 2018-01-09 DIAGNOSIS — Z792 Long term (current) use of antibiotics: Secondary | ICD-10-CM

## 2018-01-09 DIAGNOSIS — Z79890 Hormone replacement therapy (postmenopausal): Secondary | ICD-10-CM | POA: Diagnosis not present

## 2018-01-09 DIAGNOSIS — Z87442 Personal history of urinary calculi: Secondary | ICD-10-CM

## 2018-01-09 DIAGNOSIS — Z90721 Acquired absence of ovaries, unilateral: Secondary | ICD-10-CM

## 2018-01-09 DIAGNOSIS — Z6838 Body mass index (BMI) 38.0-38.9, adult: Secondary | ICD-10-CM | POA: Diagnosis not present

## 2018-01-09 DIAGNOSIS — E669 Obesity, unspecified: Secondary | ICD-10-CM | POA: Diagnosis present

## 2018-01-09 DIAGNOSIS — K861 Other chronic pancreatitis: Secondary | ICD-10-CM | POA: Diagnosis not present

## 2018-01-09 DIAGNOSIS — M1711 Unilateral primary osteoarthritis, right knee: Secondary | ICD-10-CM | POA: Diagnosis present

## 2018-01-09 DIAGNOSIS — Z8719 Personal history of other diseases of the digestive system: Secondary | ICD-10-CM

## 2018-01-09 DIAGNOSIS — Z8701 Personal history of pneumonia (recurrent): Secondary | ICD-10-CM

## 2018-01-09 DIAGNOSIS — Z791 Long term (current) use of non-steroidal anti-inflammatories (NSAID): Secondary | ICD-10-CM

## 2018-01-09 DIAGNOSIS — Z96659 Presence of unspecified artificial knee joint: Secondary | ICD-10-CM

## 2018-01-09 DIAGNOSIS — Z885 Allergy status to narcotic agent status: Secondary | ICD-10-CM

## 2018-01-09 DIAGNOSIS — M21061 Valgus deformity, not elsewhere classified, right knee: Secondary | ICD-10-CM | POA: Diagnosis not present

## 2018-01-09 DIAGNOSIS — T84032A Mechanical loosening of internal right knee prosthetic joint, initial encounter: Secondary | ICD-10-CM | POA: Diagnosis not present

## 2018-01-09 DIAGNOSIS — Z888 Allergy status to other drugs, medicaments and biological substances status: Secondary | ICD-10-CM

## 2018-01-09 DIAGNOSIS — Z8269 Family history of other diseases of the musculoskeletal system and connective tissue: Secondary | ICD-10-CM

## 2018-01-09 DIAGNOSIS — Z8379 Family history of other diseases of the digestive system: Secondary | ICD-10-CM

## 2018-01-09 DIAGNOSIS — K219 Gastro-esophageal reflux disease without esophagitis: Secondary | ICD-10-CM | POA: Diagnosis not present

## 2018-01-09 DIAGNOSIS — F329 Major depressive disorder, single episode, unspecified: Secondary | ICD-10-CM | POA: Diagnosis present

## 2018-01-09 DIAGNOSIS — Z882 Allergy status to sulfonamides status: Secondary | ICD-10-CM

## 2018-01-09 DIAGNOSIS — T84018A Broken internal joint prosthesis, other site, initial encounter: Secondary | ICD-10-CM

## 2018-01-09 DIAGNOSIS — Y792 Prosthetic and other implants, materials and accessory orthopedic devices associated with adverse incidents: Secondary | ICD-10-CM | POA: Diagnosis present

## 2018-01-09 DIAGNOSIS — Z981 Arthrodesis status: Secondary | ICD-10-CM

## 2018-01-09 DIAGNOSIS — K581 Irritable bowel syndrome with constipation: Secondary | ICD-10-CM | POA: Diagnosis present

## 2018-01-09 DIAGNOSIS — K863 Pseudocyst of pancreas: Secondary | ICD-10-CM | POA: Diagnosis present

## 2018-01-09 DIAGNOSIS — Z9049 Acquired absence of other specified parts of digestive tract: Secondary | ICD-10-CM | POA: Diagnosis not present

## 2018-01-09 DIAGNOSIS — E039 Hypothyroidism, unspecified: Secondary | ICD-10-CM | POA: Diagnosis not present

## 2018-01-09 DIAGNOSIS — Z79891 Long term (current) use of opiate analgesic: Secondary | ICD-10-CM

## 2018-01-09 DIAGNOSIS — Z96651 Presence of right artificial knee joint: Secondary | ICD-10-CM | POA: Diagnosis not present

## 2018-01-09 DIAGNOSIS — Z90710 Acquired absence of both cervix and uterus: Secondary | ICD-10-CM | POA: Diagnosis not present

## 2018-01-09 DIAGNOSIS — F419 Anxiety disorder, unspecified: Secondary | ICD-10-CM | POA: Diagnosis present

## 2018-01-09 DIAGNOSIS — Z87440 Personal history of urinary (tract) infections: Secondary | ICD-10-CM

## 2018-01-09 DIAGNOSIS — Z79899 Other long term (current) drug therapy: Secondary | ICD-10-CM

## 2018-01-09 DIAGNOSIS — G8918 Other acute postprocedural pain: Secondary | ICD-10-CM | POA: Diagnosis not present

## 2018-01-09 HISTORY — PX: TOTAL KNEE REVISION: SHX996

## 2018-01-09 LAB — TYPE AND SCREEN
ABO/RH(D): B POS
Antibody Screen: NEGATIVE

## 2018-01-09 SURGERY — TOTAL KNEE REVISION
Anesthesia: Regional | Site: Knee | Laterality: Right

## 2018-01-09 MED ORDER — DOCUSATE SODIUM 100 MG PO CAPS
100.0000 mg | ORAL_CAPSULE | Freq: Two times a day (BID) | ORAL | Status: DC
  Administered 2018-01-09 – 2018-01-11 (×4): 100 mg via ORAL
  Filled 2018-01-09 (×4): qty 1

## 2018-01-09 MED ORDER — GABAPENTIN 300 MG PO CAPS
300.0000 mg | ORAL_CAPSULE | Freq: Once | ORAL | Status: AC
  Administered 2018-01-09: 300 mg via ORAL

## 2018-01-09 MED ORDER — CHLORHEXIDINE GLUCONATE 4 % EX LIQD: 60.0000 mL | Freq: Once | CUTANEOUS | Status: DC

## 2018-01-09 MED ORDER — METHOCARBAMOL 500 MG IVPB - SIMPLE MED
500.0000 mg | Freq: Four times a day (QID) | INTRAVENOUS | Status: DC | PRN
  Administered 2018-01-09: 500 mg via INTRAVENOUS
  Filled 2018-01-09: qty 50

## 2018-01-09 MED ORDER — SODIUM CHLORIDE 0.9 % IR SOLN
Status: DC | PRN
  Administered 2018-01-09: 1000 mL

## 2018-01-09 MED ORDER — ONDANSETRON HCL 4 MG/2ML IJ SOLN
INTRAMUSCULAR | Status: DC | PRN
  Administered 2018-01-09: 4 mg via INTRAVENOUS

## 2018-01-09 MED ORDER — PROPOFOL 500 MG/50ML IV EMUL
INTRAVENOUS | Status: DC | PRN
  Administered 2018-01-09: 50 ug/kg/min via INTRAVENOUS

## 2018-01-09 MED ORDER — METHOCARBAMOL 1000 MG/10ML IJ SOLN
500.0000 mg | Freq: Once | INTRAVENOUS | Status: AC
  Administered 2018-01-09: 500 mg via INTRAVENOUS
  Filled 2018-01-09: qty 500

## 2018-01-09 MED ORDER — MIDAZOLAM HCL 2 MG/2ML IJ SOLN
INTRAMUSCULAR | Status: AC
  Filled 2018-01-09: qty 2

## 2018-01-09 MED ORDER — TRANEXAMIC ACID-NACL 1000-0.7 MG/100ML-% IV SOLN
1000.0000 mg | INTRAVENOUS | Status: AC
  Administered 2018-01-09: 1000 mg via INTRAVENOUS
  Filled 2018-01-09: qty 100

## 2018-01-09 MED ORDER — ONDANSETRON HCL 4 MG/2ML IJ SOLN
4.0000 mg | Freq: Four times a day (QID) | INTRAMUSCULAR | Status: DC | PRN
  Administered 2018-01-09 (×2): 4 mg via INTRAVENOUS
  Filled 2018-01-09 (×2): qty 2

## 2018-01-09 MED ORDER — PROMETHAZINE HCL 25 MG/ML IJ SOLN
6.2500 mg | INTRAMUSCULAR | Status: DC | PRN
  Administered 2018-01-09: 6.25 mg via INTRAVENOUS

## 2018-01-09 MED ORDER — METHOCARBAMOL 500 MG IVPB - SIMPLE MED
INTRAVENOUS | Status: AC
  Administered 2018-01-09: 500 mg
  Filled 2018-01-09: qty 50

## 2018-01-09 MED ORDER — DEXAMETHASONE SODIUM PHOSPHATE 10 MG/ML IJ SOLN
INTRAMUSCULAR | Status: AC
  Filled 2018-01-09: qty 1

## 2018-01-09 MED ORDER — DULOXETINE HCL 60 MG PO CPEP
60.0000 mg | ORAL_CAPSULE | Freq: Two times a day (BID) | ORAL | Status: DC
  Administered 2018-01-09 – 2018-01-11 (×4): 60 mg via ORAL
  Filled 2018-01-09 (×4): qty 1

## 2018-01-09 MED ORDER — DIPHENHYDRAMINE HCL 12.5 MG/5ML PO ELIX
12.5000 mg | ORAL_SOLUTION | ORAL | Status: DC | PRN
  Administered 2018-01-09: 25 mg via ORAL
  Filled 2018-01-09: qty 10

## 2018-01-09 MED ORDER — ACETAMINOPHEN 10 MG/ML IV SOLN
1000.0000 mg | Freq: Four times a day (QID) | INTRAVENOUS | Status: DC
  Administered 2018-01-09: 1000 mg via INTRAVENOUS
  Filled 2018-01-09: qty 100

## 2018-01-09 MED ORDER — URELLE 81 MG PO TABS
1.0000 | ORAL_TABLET | Freq: Four times a day (QID) | ORAL | Status: DC | PRN
  Filled 2018-01-09: qty 1

## 2018-01-09 MED ORDER — CEFAZOLIN SODIUM-DEXTROSE 2-4 GM/100ML-% IV SOLN
2.0000 g | Freq: Four times a day (QID) | INTRAVENOUS | Status: AC
  Administered 2018-01-09 – 2018-01-10 (×2): 2 g via INTRAVENOUS
  Filled 2018-01-09 (×2): qty 100

## 2018-01-09 MED ORDER — CEFAZOLIN SODIUM-DEXTROSE 2-4 GM/100ML-% IV SOLN
2.0000 g | INTRAVENOUS | Status: AC
  Administered 2018-01-09: 2 g via INTRAVENOUS
  Filled 2018-01-09: qty 100

## 2018-01-09 MED ORDER — POLYETHYLENE GLYCOL 3350 17 G PO PACK: 17.0000 g | PACK | Freq: Every day | ORAL | Status: DC | PRN

## 2018-01-09 MED ORDER — ALBUTEROL SULFATE (2.5 MG/3ML) 0.083% IN NEBU
2.5000 mg | INHALATION_SOLUTION | RESPIRATORY_TRACT | Status: DC | PRN
  Administered 2018-01-11: 2.5 mg via RESPIRATORY_TRACT
  Filled 2018-01-09: qty 3

## 2018-01-09 MED ORDER — ONDANSETRON HCL 4 MG/2ML IJ SOLN: 4.0000 mg | Freq: Once | INTRAMUSCULAR | Status: DC | PRN

## 2018-01-09 MED ORDER — MENTHOL 3 MG MT LOZG: 1.0000 | LOZENGE | OROMUCOSAL | Status: DC | PRN

## 2018-01-09 MED ORDER — ALBUTEROL SULFATE HFA 108 (90 BASE) MCG/ACT IN AERS: 2.0000 | INHALATION_SPRAY | RESPIRATORY_TRACT | Status: DC | PRN

## 2018-01-09 MED ORDER — PROPOFOL 10 MG/ML IV BOLUS
INTRAVENOUS | Status: DC | PRN
  Administered 2018-01-09: 20 mg via INTRAVENOUS

## 2018-01-09 MED ORDER — CLONIDINE HCL (ANALGESIA) 100 MCG/ML EP SOLN
EPIDURAL | Status: DC | PRN
  Administered 2018-01-09: 100 ug

## 2018-01-09 MED ORDER — ONDANSETRON HCL 4 MG/2ML IJ SOLN
INTRAMUSCULAR | Status: AC
  Filled 2018-01-09: qty 2

## 2018-01-09 MED ORDER — FENTANYL CITRATE (PF) 100 MCG/2ML IJ SOLN
INTRAMUSCULAR | Status: AC
  Administered 2018-01-09: 50 ug via INTRAVENOUS
  Filled 2018-01-09: qty 2

## 2018-01-09 MED ORDER — FLEET ENEMA 7-19 GM/118ML RE ENEM: 1.0000 | ENEMA | Freq: Once | RECTAL | Status: DC | PRN

## 2018-01-09 MED ORDER — NITROFURANTOIN MONOHYD MACRO 100 MG PO CAPS
100.0000 mg | ORAL_CAPSULE | Freq: Every day | ORAL | Status: DC
  Administered 2018-01-10 – 2018-01-11 (×2): 100 mg via ORAL
  Filled 2018-01-09 (×2): qty 1

## 2018-01-09 MED ORDER — TAPENTADOL HCL ER 50 MG PO TB12
150.0000 mg | ORAL_TABLET | Freq: Two times a day (BID) | ORAL | Status: DC
  Administered 2018-01-09 – 2018-01-11 (×4): 150 mg via ORAL
  Filled 2018-01-09 (×4): qty 3

## 2018-01-09 MED ORDER — EPHEDRINE 5 MG/ML INJ
INTRAVENOUS | Status: AC
  Filled 2018-01-09: qty 10

## 2018-01-09 MED ORDER — PREGABALIN 100 MG PO CAPS
300.0000 mg | ORAL_CAPSULE | Freq: Two times a day (BID) | ORAL | Status: DC
  Administered 2018-01-09 – 2018-01-11 (×4): 300 mg via ORAL
  Filled 2018-01-09 (×4): qty 3

## 2018-01-09 MED ORDER — ACETAMINOPHEN 500 MG PO TABS
1000.0000 mg | ORAL_TABLET | Freq: Four times a day (QID) | ORAL | Status: AC
  Administered 2018-01-09 – 2018-01-10 (×4): 1000 mg via ORAL
  Filled 2018-01-09 (×4): qty 2

## 2018-01-09 MED ORDER — BUPIVACAINE LIPOSOME 1.3 % IJ SUSP
INTRAMUSCULAR | Status: DC | PRN
  Administered 2018-01-09: 20 mL

## 2018-01-09 MED ORDER — PANCRELIPASE (LIP-PROT-AMYL) 12000-38000 UNITS PO CPEP
36000.0000 [IU] | ORAL_CAPSULE | Freq: Three times a day (TID) | ORAL | Status: DC
  Administered 2018-01-10 – 2018-01-11 (×4): 36000 [IU] via ORAL
  Filled 2018-01-09 (×5): qty 3

## 2018-01-09 MED ORDER — SODIUM CHLORIDE 0.9 % IJ SOLN
INTRAMUSCULAR | Status: AC
  Filled 2018-01-09: qty 50

## 2018-01-09 MED ORDER — PHENYLEPHRINE 40 MCG/ML (10ML) SYRINGE FOR IV PUSH (FOR BLOOD PRESSURE SUPPORT)
PREFILLED_SYRINGE | INTRAVENOUS | Status: AC
  Filled 2018-01-09: qty 10

## 2018-01-09 MED ORDER — MORPHINE SULFATE (PF) 2 MG/ML IV SOLN
1.0000 mg | INTRAVENOUS | Status: DC | PRN
  Administered 2018-01-09 (×2): 1 mg via INTRAVENOUS
  Filled 2018-01-09 (×2): qty 1

## 2018-01-09 MED ORDER — FENTANYL CITRATE (PF) 100 MCG/2ML IJ SOLN
25.0000 ug | INTRAMUSCULAR | Status: DC | PRN
  Administered 2018-01-09: 50 ug via INTRAVENOUS

## 2018-01-09 MED ORDER — HYDROCHLOROTHIAZIDE 25 MG PO TABS
25.0000 mg | ORAL_TABLET | Freq: Every day | ORAL | Status: DC
  Administered 2018-01-10 – 2018-01-11 (×2): 25 mg via ORAL
  Filled 2018-01-09 (×2): qty 1

## 2018-01-09 MED ORDER — PROPOFOL 10 MG/ML IV BOLUS
INTRAVENOUS | Status: AC
  Filled 2018-01-09: qty 40

## 2018-01-09 MED ORDER — PHENYLEPHRINE 40 MCG/ML (10ML) SYRINGE FOR IV PUSH (FOR BLOOD PRESSURE SUPPORT)
PREFILLED_SYRINGE | INTRAVENOUS | Status: DC | PRN
  Administered 2018-01-09 (×2): 80 ug via INTRAVENOUS
  Administered 2018-01-09 (×2): 40 ug via INTRAVENOUS
  Administered 2018-01-09: 80 ug via INTRAVENOUS
  Administered 2018-01-09: 40 ug via INTRAVENOUS

## 2018-01-09 MED ORDER — BUPIVACAINE IN DEXTROSE 0.75-8.25 % IT SOLN
INTRATHECAL | Status: DC | PRN
  Administered 2018-01-09: 1.8 mL via INTRATHECAL

## 2018-01-09 MED ORDER — ROPIVACAINE HCL 5 MG/ML IJ SOLN
INTRAMUSCULAR | Status: DC | PRN
  Administered 2018-01-09: 30 mL via PERINEURAL

## 2018-01-09 MED ORDER — ALPRAZOLAM 1 MG PO TABS
1.0000 mg | ORAL_TABLET | Freq: Three times a day (TID) | ORAL | Status: DC | PRN
  Administered 2018-01-09 – 2018-01-11 (×2): 1 mg via ORAL
  Filled 2018-01-09 (×2): qty 1

## 2018-01-09 MED ORDER — SODIUM CHLORIDE 0.9 % IV SOLN
INTRAVENOUS | Status: DC
  Administered 2018-01-09: 18:00:00 via INTRAVENOUS
  Administered 2018-01-10: 1000 mL via INTRAVENOUS
  Administered 2018-01-10: 06:00:00 via INTRAVENOUS

## 2018-01-09 MED ORDER — EPHEDRINE SULFATE-NACL 50-0.9 MG/10ML-% IV SOSY
PREFILLED_SYRINGE | INTRAVENOUS | Status: DC | PRN
  Administered 2018-01-09 (×2): 10 mg via INTRAVENOUS
  Administered 2018-01-09: 15 mg via INTRAVENOUS
  Administered 2018-01-09: 10 mg via INTRAVENOUS
  Administered 2018-01-09: 5 mg via INTRAVENOUS

## 2018-01-09 MED ORDER — LEVOTHYROXINE SODIUM 125 MCG PO TABS
125.0000 ug | ORAL_TABLET | Freq: Every day | ORAL | Status: DC
  Administered 2018-01-10 – 2018-01-11 (×2): 125 ug via ORAL
  Filled 2018-01-09 (×2): qty 1

## 2018-01-09 MED ORDER — METOCLOPRAMIDE HCL 5 MG PO TABS: 5.0000 mg | ORAL_TABLET | Freq: Three times a day (TID) | ORAL | Status: DC | PRN

## 2018-01-09 MED ORDER — OXYCODONE HCL 5 MG PO TABS
5.0000 mg | ORAL_TABLET | ORAL | Status: DC | PRN
  Administered 2018-01-09: 10 mg via ORAL
  Filled 2018-01-09: qty 2

## 2018-01-09 MED ORDER — 0.9 % SODIUM CHLORIDE (POUR BTL) OPTIME
TOPICAL | Status: DC | PRN
  Administered 2018-01-09: 1000 mL

## 2018-01-09 MED ORDER — LORATADINE 10 MG PO TABS: 10.0000 mg | ORAL_TABLET | Freq: Every day | ORAL | Status: DC | PRN

## 2018-01-09 MED ORDER — METOCLOPRAMIDE HCL 5 MG/ML IJ SOLN: 5.0000 mg | Freq: Three times a day (TID) | INTRAMUSCULAR | Status: DC | PRN

## 2018-01-09 MED ORDER — PROMETHAZINE HCL 25 MG/ML IJ SOLN
INTRAMUSCULAR | Status: AC
  Filled 2018-01-09: qty 1

## 2018-01-09 MED ORDER — ASPIRIN EC 325 MG PO TBEC
325.0000 mg | DELAYED_RELEASE_TABLET | Freq: Two times a day (BID) | ORAL | Status: DC
  Administered 2018-01-10 – 2018-01-11 (×3): 325 mg via ORAL
  Filled 2018-01-09 (×3): qty 1

## 2018-01-09 MED ORDER — STERILE WATER FOR IRRIGATION IR SOLN
Status: DC | PRN
  Administered 2018-01-09: 2000 mL

## 2018-01-09 MED ORDER — FENTANYL CITRATE (PF) 100 MCG/2ML IJ SOLN
50.0000 ug | INTRAMUSCULAR | Status: DC | PRN
  Filled 2018-01-09: qty 2

## 2018-01-09 MED ORDER — SODIUM CHLORIDE 0.9 % IJ SOLN
INTRAMUSCULAR | Status: DC | PRN
  Administered 2018-01-09: 60 mL

## 2018-01-09 MED ORDER — URIBEL 118 MG PO CAPS: 118.0000 mg | ORAL_CAPSULE | Freq: Four times a day (QID) | ORAL | Status: DC | PRN

## 2018-01-09 MED ORDER — BISACODYL 10 MG RE SUPP: 10.0000 mg | Freq: Every day | RECTAL | Status: DC | PRN

## 2018-01-09 MED ORDER — PROMETHAZINE HCL 25 MG PO TABS
25.0000 mg | ORAL_TABLET | Freq: Three times a day (TID) | ORAL | Status: DC | PRN
  Administered 2018-01-10 (×2): 25 mg via ORAL
  Filled 2018-01-09 (×2): qty 1

## 2018-01-09 MED ORDER — SODIUM CHLORIDE 0.9 % IJ SOLN
INTRAMUSCULAR | Status: AC
  Filled 2018-01-09: qty 10

## 2018-01-09 MED ORDER — ONDANSETRON HCL 4 MG PO TABS
4.0000 mg | ORAL_TABLET | Freq: Four times a day (QID) | ORAL | Status: DC | PRN
  Administered 2018-01-10: 4 mg via ORAL
  Filled 2018-01-09: qty 1

## 2018-01-09 MED ORDER — MORPHINE SULFATE (PF) 2 MG/ML IV SOLN
2.0000 mg | INTRAVENOUS | Status: DC | PRN
  Administered 2018-01-09 – 2018-01-10 (×2): 4 mg via INTRAVENOUS
  Administered 2018-01-10 (×2): 2 mg via INTRAVENOUS
  Administered 2018-01-10 (×4): 4 mg via INTRAVENOUS
  Administered 2018-01-11: 2 mg via INTRAVENOUS
  Filled 2018-01-09: qty 1
  Filled 2018-01-09: qty 2
  Filled 2018-01-09: qty 1
  Filled 2018-01-09 (×4): qty 2
  Filled 2018-01-09: qty 1
  Filled 2018-01-09: qty 2

## 2018-01-09 MED ORDER — METHOCARBAMOL 500 MG PO TABS
500.0000 mg | ORAL_TABLET | Freq: Four times a day (QID) | ORAL | Status: DC | PRN
  Administered 2018-01-09 – 2018-01-11 (×5): 500 mg via ORAL
  Filled 2018-01-09 (×5): qty 1

## 2018-01-09 MED ORDER — LACTATED RINGERS IV SOLN
INTRAVENOUS | Status: DC
  Administered 2018-01-09: 1000 mL via INTRAVENOUS
  Administered 2018-01-09: 14:00:00 via INTRAVENOUS

## 2018-01-09 MED ORDER — OXYCODONE HCL 5 MG PO TABS
10.0000 mg | ORAL_TABLET | ORAL | Status: DC | PRN
  Administered 2018-01-09 – 2018-01-11 (×9): 15 mg via ORAL
  Filled 2018-01-09 (×10): qty 3

## 2018-01-09 MED ORDER — MIDAZOLAM HCL 2 MG/2ML IJ SOLN
1.0000 mg | INTRAMUSCULAR | Status: DC | PRN
  Administered 2018-01-09: 2 mg via INTRAVENOUS

## 2018-01-09 MED ORDER — GABAPENTIN 300 MG PO CAPS
ORAL_CAPSULE | ORAL | Status: AC
  Filled 2018-01-09: qty 1

## 2018-01-09 MED ORDER — PHENOL 1.4 % MT LIQD
1.0000 | OROMUCOSAL | Status: DC | PRN
  Filled 2018-01-09: qty 177

## 2018-01-09 SURGICAL SUPPLY — 76 items
ADAPTER BOLT FEMORAL +2/-2 (Knees) ×2 IMPLANT
ADPR FEM +2/-2 OFST BOLT (Knees) ×1 IMPLANT
ADPR FEM 5D STRL KN PFC SGM (Orthopedic Implant) ×1 IMPLANT
AUG FEM SZ3 4 CMB POST STRL LF (Knees) ×1 IMPLANT
AUG FEM SZ3 4 STRL LF KN RT TI (Knees) ×2 IMPLANT
AUG FEM SZ3 8 CMB POST STRL LF (Knees) ×1 IMPLANT
AUG TIB 1X15 REV STP WDG (Orthopedic Implant) ×2 IMPLANT
BAG DECANTER FOR FLEXI CONT (MISCELLANEOUS) ×1 IMPLANT
BAG SPEC THK2 15X12 ZIP CLS (MISCELLANEOUS)
BAG ZIPLOCK 12X15 (MISCELLANEOUS) IMPLANT
BANDAGE ACE 6X5 VEL STRL LF (GAUZE/BANDAGES/DRESSINGS) ×3 IMPLANT
BLADE SAG 18X100X1.27 (BLADE) ×3 IMPLANT
BLADE SAW SGTL 11.0X1.19X90.0M (BLADE) ×3 IMPLANT
BONE CEMENT GENTAMICIN (Cement) ×9 IMPLANT
CEMENT BONE GENTAMICIN 40 (Cement) ×3 IMPLANT
CEMENT RESTRICTOR DEPUY SZ 3 (Cement) ×3 IMPLANT
CLOSURE WOUND 1/2 X4 (GAUZE/BANDAGES/DRESSINGS) ×1
CLOTH BEACON ORANGE TIMEOUT ST (SAFETY) IMPLANT
COMP FEM CEM RT SZ3 (Orthopedic Implant) ×3 IMPLANT
COMPONENT FEM CEM RT SZ3 (Orthopedic Implant) IMPLANT
COVER SURGICAL LIGHT HANDLE (MISCELLANEOUS) ×3 IMPLANT
COVER WAND RF STERILE (DRAPES) ×2 IMPLANT
CUFF TOURN SGL QUICK 34 (TOURNIQUET CUFF) ×3
CUFF TRNQT CYL 34X4X40X1 (TOURNIQUET CUFF) ×1 IMPLANT
DECANTER SPIKE VIAL GLASS SM (MISCELLANEOUS) ×2 IMPLANT
DISTAL WEDGE PFC 4MM RIGHT (Knees) ×6 IMPLANT
DRAPE U-SHAPE 47X51 STRL (DRAPES) ×3 IMPLANT
DRSG ADAPTIC 3X8 NADH LF (GAUZE/BANDAGES/DRESSINGS) ×3 IMPLANT
DRSG PAD ABDOMINAL 8X10 ST (GAUZE/BANDAGES/DRESSINGS) ×3 IMPLANT
DURAPREP 26ML APPLICATOR (WOUND CARE) ×3 IMPLANT
ELECT REM PT RETURN 15FT ADLT (MISCELLANEOUS) ×3 IMPLANT
EVACUATOR 1/8 PVC DRAIN (DRAIN) ×3 IMPLANT
FEMORAL ADAPTER (Orthopedic Implant) ×3 IMPLANT
GAUZE SPONGE 4X4 12PLY STRL (GAUZE/BANDAGES/DRESSINGS) ×3 IMPLANT
GLOVE BIO SURGEON STRL SZ7 (GLOVE) ×3 IMPLANT
GLOVE BIO SURGEON STRL SZ8 (GLOVE) ×3 IMPLANT
GLOVE BIOGEL PI IND STRL 7.0 (GLOVE) ×1 IMPLANT
GLOVE BIOGEL PI IND STRL 8 (GLOVE) ×1 IMPLANT
GLOVE BIOGEL PI INDICATOR 7.0 (GLOVE) ×2
GLOVE BIOGEL PI INDICATOR 8 (GLOVE) ×2
GOWN STRL REUS W/TWL LRG LVL3 (GOWN DISPOSABLE) ×3 IMPLANT
GOWN STRL REUS W/TWL XL LVL3 (GOWN DISPOSABLE) ×3 IMPLANT
HANDPIECE INTERPULSE COAX TIP (DISPOSABLE) ×3
HOLDER FOLEY CATH W/STRAP (MISCELLANEOUS) ×2 IMPLANT
IMMOBILIZER KNEE 20 (SOFTGOODS) ×3
IMMOBILIZER KNEE 20 THIGH 36 (SOFTGOODS) ×1 IMPLANT
INSERT TC3 TIBIAL SZ3 20.0 (Knees) ×2 IMPLANT
MANIFOLD NEPTUNE II (INSTRUMENTS) ×3 IMPLANT
NS IRRIG 1000ML POUR BTL (IV SOLUTION) ×3 IMPLANT
PACK TOTAL KNEE CUSTOM (KITS) ×3 IMPLANT
PAD ABD 8X10 STRL (GAUZE/BANDAGES/DRESSINGS) ×2 IMPLANT
PADDING CAST COTTON 6X4 STRL (CAST SUPPLIES) ×6 IMPLANT
POSITIONER SURGICAL ARM (MISCELLANEOUS) ×3 IMPLANT
POST AVE PFC 4MM (Knees) ×2 IMPLANT
POST AVE PFC 8MM (Knees) ×2 IMPLANT
SET HNDPC FAN SPRY TIP SCT (DISPOSABLE) ×1 IMPLANT
STEM TIBIA PFC 13X30MM (Stem) ×3 IMPLANT
STEM UNIVERSAL REVISION 75X16 (Stem) ×2 IMPLANT
STRIP CLOSURE SKIN 1/2X4 (GAUZE/BANDAGES/DRESSINGS) ×2 IMPLANT
SUT MNCRL AB 4-0 PS2 18 (SUTURE) ×3 IMPLANT
SUT STRATAFIX 0 PDS 27 VIOLET (SUTURE) ×3
SUT VIC AB 2-0 CT1 27 (SUTURE) ×9
SUT VIC AB 2-0 CT1 TAPERPNT 27 (SUTURE) ×3 IMPLANT
SUTURE STRATFX 0 PDS 27 VIOLET (SUTURE) ×1 IMPLANT
SWAB COLLECTION DEVICE MRSA (MISCELLANEOUS) IMPLANT
SWAB CULTURE ESWAB REG 1ML (MISCELLANEOUS) IMPLANT
SYR 50ML LL SCALE MARK (SYRINGE) ×6 IMPLANT
TOWER CARTRIDGE SMART MIX (DISPOSABLE) ×3 IMPLANT
TRAY FOLEY MTR SLVR 16FR STAT (SET/KITS/TRAYS/PACK) ×1 IMPLANT
TRAY REVISION SZ 3 15MM (Joint) ×2 IMPLANT
TRAY SLEEVE CEM ML (Knees) ×3 IMPLANT
TUBE KAMVAC SUCTION (TUBING) IMPLANT
WATER STERILE IRR 1000ML POUR (IV SOLUTION) ×5 IMPLANT
WEDGE DISTAL PFC RIGHT 4MM (Knees) IMPLANT
WEDGE STEP 1.5X15MM JOINT (Orthopedic Implant) ×6 IMPLANT
WRAP KNEE MAXI GEL POST OP (GAUZE/BANDAGES/DRESSINGS) ×2 IMPLANT

## 2018-01-09 NOTE — Op Note (Signed)
NAMEKEMBERLY, TAVES MEDICAL RECORD [id number] ACCOUNT [id number] DATE OF BIRTH:1965/03/19 FACILITY: [facility] LOCATION: [id number] PHYSICIAN:Judas Mohammad Dulcy Fanny, MD  OPERATIVE REPORT  DATE OF PROCEDURE:  01/09/2018  PREOPERATIVE DIAGNOSIS:  Loose unstable right total knee arthroplasty.  POSTOPERATIVE DIAGNOSIS:  Loose unstable right total knee arthroplasty.  PROCEDURE:  Right total knee arthroplasty revision.  SURGEON:  Ollen Gross, MD  ASSISTANT:  Inocencio Homes, PA-C   ANESTHESIA:  Spinal with adductor canal block.  ESTIMATED BLOOD LOSS:  50 mL.  DRAINS:  Hemovac x1.  COMPLICATIONS:  None.  CONDITION:  Stable to recovery.  BRIEF CLINICAL NOTE:  The patient is a 52 year old female who had a total knee arthroplasty done several years ago.  It has collapsed into progressive valgus, and it is very unstable and very painful for her.  The tibial component is obviously loose as  it has migrated.  She presents now for total knee arthroplasty revision.  PROCEDURE IN DETAIL:  After successful administration of adductor canal block and spinal anesthetic, a tourniquet was placed high on her right thigh, and her right lower extremity was prepped and draped in the usual sterile fashion.  Extremity was  wrapped in Esmarch and tourniquet inflated to 300 mmHg.  A midline incision was made with a 10 blade through the subcutaneous tissue through the extensor mechanism.  A fresh blade was used to make a medial parapatellar arthrotomy.  No evidence of any  abnormal fluid in the joint.  Soft tissue over the proximal medial tibia subperiosteally elevated to the joint line with a knife and into the semimembranosus bursa with a Cobb elevator.  Soft tissue laterally was elevated with attention being paid to  avoiding the patellar tendon on the tibial tubercle.  I was able to evert the patella and flex to 90 degrees.  I was then able to translate the tibia forward, essentially dislocating the polyethylene  from the femoral component.  I then removed the  polyethylene.  There was a rotating platform, Attune polyethylene, which was approximately 15 mm thick.  Once it was removed and the tibia subluxed forward and circumferential retraction placed around the tibia, the tibia collapsed into significant  valgus.  I used an oscillating saw to interrupt the interface between the tibial component and bone.  Bone had actually overgrown the lateral aspect of the bone.  I had to chip away the bone in order to get to the lateral border of the component.  The  component was removed with no bone loss.  There was a significant amount of bone loss laterally from the way it collapsed into valgus, but no additional bone loss with removing it.  We then placed the extramedullary tibial alignment guide, referencing  proximally at the medial aspect of the tibial tubercle and distally along the second metatarsal axis and tibial crest.  Block was pinned to remove about 2 mm off the more deficient lateral side.  This led to a large resection medially.  Resection was  made with an oscillating saw.  Down to the level of the resection, either 1 or 2 was the appropriate tibial size, but we needed to get up to a 3 or 4 to match the femur.  Thus, we decided to use the revision tibial block, which is a size 3 tapered, down  to a size 1 with a 3B at the joint line, 1 being at the resection level.  We then prepared the proximal tibia first with reaming the canal up to 13 mm  for a 13 mm cemented stem and then preparing with a 29 sleeve, also preparing proximally with the  modular drill and modular drill Plus 13 x 30 stem.  We then addressed the femur.  Osteotomes were used to disrupt the interface between the femoral component and bone.  The femoral component was removed easily with minimal to no bone loss.  Intercondylar notch was accessed, and I accessed the femoral  canal.  The canal was thoroughly irrigated.  We reamed up to 16 mm, which  had an excellent press fit.  A 60 mm reamer was left in the canal to serve as our intramedullary cutting guide.  The distal femoral cutting block was placed, the block pinned to  remove about 2 mm off the distal femur to get a flat resection level.  I decided to use 4 mm distal augments to build the joint line back to where it was supposed to be.  Size 3 was going to be the most appropriate femoral component.  We then placed AP  cutting block, and rotation was marked off the epicondylar axis and confirmed with a spacer block to greater rectangular stable flexion space.  We did not remove any bone anteriorly or through the chamfers, and I went to the +4 position to get any bone  posteromedial, +8 position to get any bone posterolaterally.  Thus, we needed a 4 mm posteromedial augment, 8 mm posterolateral augment.  Intercondylar block was then placed for making the intercondylar cut for the TC3 component.  The trials were placed.  On the tibial side, it was a thick block which is a size 3 proximal, size 1 at the resection level, and it is 15 mm thick.  I also placed 5 mm augments medial and lateral at the resection level to build it back up to the  appropriate level.  In addition, we had a 29 sleeve.  This was placed for rotational stability.  A 13 x 30 stem extension was placed.  Tibial component had excellent fit.  Then on the femoral side, we did a size 3 TC3 femur 16 x 75 stem extension in a +2  position, 5 degrees of valgus for the right, also with 4 mm augments distally, medial and lateral, 8 mm posterior augment lateral, 4 mm posterior augment medial.  This had excellent fit on the native femur.  I placed a 15 mm insert at first.  With  slight hyperextension, we went to a 17.5 initially, 20 with a 20.  Full extension was achieved with excellent varus/valgus and anterior/posterior balance throughout full range of motion.  The patellar component was intact, but I had to do a patelloplasty  to remove all  of the soft tissue around the component.  Once the patelloplasty was completed, then the patella tracked normally.  At this point, the components were assembled on the back table.  The tourniquet was released for initial tourniquet time of  57 minutes.  The tourniquet was held down for 8 minutes while the components were assembled on the back table.  There was minimal bleeding, and the bleeding that was encountered was stopped with electrocautery.  After 8 minutes, we rewrapped the leg in  Esmarch and reinflated the tourniquet to 300 mmHg.  All the trials were removed and the cut bone surfaces prepared with pulsatile lavage.  Cement was mixed, and once ready for implantation, we cemented the tibial side first.  Please note that a size 3  tibial cement restrictor had been  placed prior to this.  On the tibial side, we cemented the component, which is a thick tray, size 3 at the joint line, size 1 at the resection level with 5 mm augments medial and lateral, a 13 x 30 stem extension, a 29  sleeve.  We cemented this in place with excellent stability.  All extruded cement was removed.  On the femoral side, we cemented distally and had the press-fit stem.  It is a size 3 TC3 femur, 4 mm augments medial and lateral distally, 8 mm  posterolateral augment, 4 mm posteromedial augment, and the stem is a 16 x 75 in a +2 position and 5 degrees of valgus.  It was impacted, and all extruded cement removed.  A 20 mm trial insert was placed.  Knee was held in full extension.  We then  injected 20 mL of Exparel mixed with 60 mL of saline into the posterior capsular structures, the medial and lateral gutters, the extensor mechanism, periosteum of the femur.  A Hemovac drain was placed.  The permanent 20 mm TC3 rotating platform insert  was placed into the tibial tray.  The wound was again copiously irrigated with saline solution and the arthrotomy closed over a drain with a running 0 Stratafix suture.  Flexion against gravity  was 125 degrees, and the patella tracked normally.  The  tourniquet was released for the second tourniquet time of 18 minutes.  The subcu was closed over a second limb of the Hemovac drain with interrupted 2-0 Vicryl and skin closed with staples.  Drains were hooked to suction.  Incision was cleaned and dried  and a bulky sterile dressing applied.  She was placed into a knee immobilizer, awakened and transported to recovery in stable condition.  Note that a surgical assistant is a medical necessity for this procedure to do it in a safe and expeditious manner.  Surgical assistant was necessary for retraction of vital ligaments and neurovascular structures as well as for proper positioning of the  limb for removal safely of the old implant as well as proper positioning of the limb for a safe and accurate placement of the new implant.  LN/NUANCE  D:01/09/2018 T:01/09/2018 JOB:[id number]

## 2018-01-09 NOTE — Anesthesia Procedure Notes (Addendum)
Spinal  Patient location during procedure: OR Start time: 01/09/2018 12:51 PM End time: 01/09/2018 12:59 PM Reason for block: at surgeon's request Staffing Anesthesiologist: Murvin Natal, MD Resident/CRNA: West Pugh, CRNA Performed: anesthesiologist  Preanesthetic Checklist Completed: patient identified, site marked, surgical consent, pre-op evaluation, timeout performed, IV checked, risks and benefits discussed and monitors and equipment checked Spinal Block Patient position: sitting Prep: Betadine and DuraPrep Patient monitoring: continuous pulse ox, heart rate and blood pressure Approach: midline Injection technique: single-shot Needle Needle type: Pencan  Needle gauge: 24 G Needle length: 9 cm Assessment Sensory level: T6 Additional Notes Expiration of kit checked and confirmed. Patient tolerated procedure well,without complications x 2  attempts. Loss of motor and sensory on exam post injection. Per CRNA Gilford Rile unsuccessful attempt. Dr Roanna Banning successful with successful attempt. Clear CSF return.

## 2018-01-09 NOTE — Anesthesia Procedure Notes (Signed)
Anesthesia [facility] Block: Adductor canal block   Pre-Anesthetic Checklist: ,, timeout performed, Correct Patient, Correct Site, Correct Laterality, Correct Procedure,, site marked, risks and benefits discussed, Surgical consent,  Pre-op evaluation,  At surgeon's request and post-op pain management  Laterality: Right  Prep: chloraprep       Needles:  Injection technique: Single-shot  Needle Type: Echogenic Stimulator Needle     Needle Length: 10cm  Needle Gauge: 21     Additional Needles:   Procedures:,,,, ultrasound used (permanent image in chart),,,,  Narrative:  Start time: 01/09/2018 12:20 PM End time: 01/09/2018 12:30 PM Injection made incrementally with aspirations every 5 mL.  Performed by: Personally  Anesthesiologist: Leonides Grills, MD  Additional Notes: Functioning IV was confirmed and monitors were applied. A time-out was performed. Hand hygiene and sterile gloves were used. The thigh was placed in a frog-leg position and prepped in a sterile fashion. A 21ga Pajunk echogenic stimulator needle was placed using ultrasound guidance.  Negative aspiration and negative test dose prior to incremental administration of local anesthetic. The patient tolerated the procedure well.

## 2018-01-09 NOTE — Anesthesia Postprocedure Evaluation (Signed)
Anesthesia Post Note  Patient: [name] [facility]  Procedure(s) Performed: RIGHT TOTAL KNEE REVISION (Right Knee)     Patient location during evaluation: PACU Anesthesia Type: Regional and Spinal Level of consciousness: oriented and awake and alert Pain management: pain level controlled Vital Signs Assessment: post-procedure vital signs reviewed and stable Respiratory status: spontaneous breathing, respiratory function stable and patient connected to nasal cannula oxygen Cardiovascular status: blood pressure returned to baseline and stable Postop Assessment: no headache, no backache, no apparent nausea or vomiting and spinal receding Anesthetic complications: no    Last Vitals:  Vitals:   01/09/18 1644 01/09/18 1854  BP: 98/66 108/60  Pulse: (!) 57 71  Resp: 18 18  Temp: (!) 36.3 C 36.6 C  SpO2: 95% 100%    Last Pain:  Vitals:   01/09/18 1915  TempSrc:   PainSc: 6                  Jamirah Zelaya P Tawnya Pujol

## 2018-01-09 NOTE — Progress Notes (Signed)
PT Cancellation Note  Patient Details Name: [name] MRN: [id number] DOB: 07-Jul-1965   Cancelled Treatment:    Reason Eval/Treat Not Completed: Pain limiting ability to participate - Pt sobbing upon PT arrival to room due to R knee pain. Pt reporting 20/10 pain, and states she cannot move tonight. PT offered sitting EOB, pt declined and states she will get up when her pain is under control. PT to follow up tomorrow morning.   Nicola Police, PT Acute Rehabilitation Services Pager [phone]  Office [phone]    Tyrone Apple D Despina Hidden 01/09/2018, 7:25 PM

## 2018-01-09 NOTE — Interval H&P Note (Signed)
History and Physical Interval Note:  01/09/2018 12:36 PM  [name]  has presented today for surgery, with the diagnosis of loose unstable right total knee arthroplasty  The various methods of treatment have been discussed with the patient and family. After consideration of risks, benefits and other options for treatment, the patient has consented to  Procedure(s) with comments: RIGHT TOTAL KNEE REVISION (Right) - as a surgical intervention .  The patient's history has been reviewed, patient examined, no change in status, stable for surgery.  I have reviewed the patient's chart and labs.  Questions were answered to the patient's satisfaction.     [name]

## 2018-01-09 NOTE — Anesthesia Procedure Notes (Signed)
Procedure Name: MAC Date/Time: 01/09/2018 12:51 PM Performed by: West Pugh, CRNA Pre-anesthesia Checklist: Patient identified, Emergency Drugs available, Suction available, Patient being monitored and Timeout performed Patient Re-evaluated:Patient Re-evaluated prior to induction Oxygen Delivery Method: Nasal cannula and Simple face mask Preoxygenation: Pre-oxygenation with 100% oxygen Induction Type: IV induction Placement Confirmation: positive ETCO2 Dental Injury: Teeth and Oropharynx as per pre-operative assessment

## 2018-01-09 NOTE — Plan of Care (Signed)
Plan of care 

## 2018-01-09 NOTE — Transfer of Care (Signed)
Immediate Anesthesia Transfer of Care Note  Patient: [name] [facility]  Procedure(s) Performed: RIGHT TOTAL KNEE REVISION (Right Knee)  Patient Location: PACU  Anesthesia Type:Spinal and MAC combined with regional for post-op pain  Level of Consciousness: awake, alert , oriented and patient cooperative  Airway & Oxygen Therapy: Patient Spontanous Breathing and Patient connected to face mask oxygen  Post-op Assessment: Report given to RN and Post -op Vital signs reviewed and stable  Post vital signs: Reviewed and stable  Last Vitals:  Vitals Value Taken Time  BP    Temp    Pulse 58 01/09/2018  3:26 PM  Resp 17 01/09/2018  3:26 PM  SpO2 97 % 01/09/2018  3:26 PM  Vitals shown include unvalidated device data.  Last Pain:  Vitals:   01/09/18 1149  TempSrc:   PainSc: 7       Patients Stated Pain Goal: 4 (22/29/79 8921)  Complications: No apparent anesthesia complications

## 2018-01-09 NOTE — Anesthesia Preprocedure Evaluation (Addendum)
Anesthesia Evaluation  Patient identified by MRN, date of birth, ID band Patient awake    Reviewed: Allergy & Precautions, NPO status , Patient's Chart, lab work & pertinent test results  History of Anesthesia Complications (+) PONV and history of anesthetic complications  Airway Mallampati: II  TM Distance: >3 FB Neck ROM: Full    Dental no notable dental hx.    Pulmonary neg pulmonary ROS,    Pulmonary exam normal breath sounds clear to auscultation       Cardiovascular negative cardio ROS Normal cardiovascular exam Rhythm:Regular Rate:Normal     Neuro/Psych PSYCHIATRIC DISORDERS Anxiety Depression negative neurological ROS     GI/Hepatic negative GI ROS, (+)     substance abuse  , IBS (irritable bowel syndrome)   Endo/Other  Hypothyroidism   Renal/GU negative Renal ROS     Musculoskeletal negative musculoskeletal ROS (+) narcotic dependentChronic pain   Abdominal (+) + obese,   Peds  Hematology negative hematology ROS (+)   Anesthesia Other Findings loose unstable right total knee arthroplasty  Reproductive/Obstetrics                            Anesthesia Physical Anesthesia Plan  ASA: III  Anesthesia Plan: Spinal and Regional   Post-op Pain Management:  Regional for Post-op pain   Induction:   PONV Risk Score and Plan: 3 and Midazolam, Ondansetron and Treatment may vary due to age or medical condition  Airway Management Planned: Natural Airway  Additional Equipment:   Intra-op Plan:   Post-operative Plan:   Informed Consent: I have reviewed the patients History and Physical, chart, labs and discussed the procedure including the risks, benefits and alternatives for the proposed anesthesia with the patient or authorized representative who has indicated his/her understanding and acceptance.   Dental advisory given  Plan Discussed with: CRNA  Anesthesia Plan  Comments:         Anesthesia Quick Evaluation

## 2018-01-09 NOTE — Brief Op Note (Signed)
01/09/2018  3:09 PM  PATIENT:  [name]  52 y.o. female  PRE-OPERATIVE DIAGNOSIS:  loose unstable right total knee arthroplasty  POST-OPERATIVE DIAGNOSIS:  loose unstable right total knee arthroplasty  PROCEDURE:  Procedure(s) with comments: RIGHT TOTAL KNEE REVISION (Right) - Adductor Block  SURGEON:  Surgeon(s) and Role:    Ollen Gross, MD - Primary  PHYSICIAN ASSISTANT:   ASSISTANTS: Arther Abbott, PA-C  ANESTHESIA:   Adductor canal block and spinal  EBL:  50 mL   BLOOD ADMINISTERED:none  DRAINS: (Medium) Hemovact drain(s) in the right knee with  Suction Open   LOCAL MEDICATIONS USED:  OTHER Exparel  COUNTS:  YES  TOURNIQUET:   Total Tourniquet Time Documented: Thigh (Right) - 58 minutes Thigh (Right) - 19 minutes Total: Thigh (Right) - 77 minutes   DICTATION: .Other Dictation: Dictation Number [id number]  PLAN OF CARE: Admit to inpatient   PATIENT DISPOSITION:  PACU - hemodynamically stable.

## 2018-01-09 NOTE — Progress Notes (Signed)
AssistedDr. Ellender with right, ultrasound guided, adductor canal block. Side rails up, monitors on throughout procedure. See vital signs in flow sheet. Tolerated Procedure well.  

## 2018-01-10 ENCOUNTER — Encounter (HOSPITAL_COMMUNITY): Payer: Self-pay | Admitting: Orthopedic Surgery

## 2018-01-10 LAB — BASIC METABOLIC PANEL
ANION GAP: 5 (ref 5–15)
BUN: 14 mg/dL (ref 6–20)
CALCIUM: 8.5 mg/dL — AB (ref 8.9–10.3)
CHLORIDE: 103 mmol/L (ref 98–111)
CO2: 32 mmol/L (ref 22–32)
Creatinine, Ser: 0.9 mg/dL (ref 0.44–1.00)
GFR calc non Af Amer: >60 mL/min (ref >60)
Glucose, Bld: 102 mg/dL — ABNORMAL HIGH (ref 70–99)
POTASSIUM: 3.5 mmol/L (ref 3.5–5.1)
Sodium: 140 mmol/L (ref 135–145)

## 2018-01-10 LAB — CBC
HEMATOCRIT: 36 % (ref 36.0–46.0)
HEMOGLOBIN: 10.6 g/dL — AB (ref 12.0–15.0)
MCH: 25.4 pg — ABNORMAL LOW (ref 26.0–34.0)
MCHC: 29.4 g/dL — ABNORMAL LOW (ref 30.0–36.0)
MCV: 86.3 fL (ref 80.0–100.0)
NRBC: 0 % (ref 0.0–0.2)
Platelets: 196 10*3/uL (ref 150–400)
RBC: 4.17 MIL/uL (ref 3.87–5.11)
RDW: 15 % (ref 11.5–15.5)
WBC: 8.7 10*3/uL (ref 4.0–10.5)

## 2018-01-10 MED ORDER — METHOCARBAMOL 500 MG PO TABS: 500.0000 mg | ORAL_TABLET | Freq: Four times a day (QID) | ORAL | 0 refills | Status: DC | PRN

## 2018-01-10 MED ORDER — OXYCODONE HCL 5 MG PO TABS: 5.0000 mg | ORAL_TABLET | Freq: Four times a day (QID) | ORAL | 0 refills | Status: DC | PRN

## 2018-01-10 MED ORDER — ASPIRIN 325 MG PO TBEC: 325.0000 mg | DELAYED_RELEASE_TABLET | Freq: Two times a day (BID) | ORAL | 0 refills | Status: DC

## 2018-01-10 NOTE — Evaluation (Signed)
Physical Therapy Evaluation Patient Details Name: [name] MRN: [id number] DOB: 06/28/1965 Today's Date: 01/10/2018   History of Present Illness  52 yo female S/P R TKA revision, Left lateral malleolus fx(CAM boot), Right fifth MT fracture. H/O left TKA, cervical and lumbar fusion.  Clinical Impression  The patient is very sleepy. Patient did tolerate mobility to recliner and there exercises to right knee. The patient Has a CAm boot. Patient to have post op shoe for right foot as sneaker is too tight.    Follow Up Recommendations Follow surgeon's recommendation for DC plan and follow-up therapies(recommend HHPT)    Equipment Recommendations  None recommended by PT    Recommendations for Other Services       Precautions / Restrictions Precautions Precautions: Fall;Knee Required Braces or Orthoses: Knee Immobilizer - Right;Other Brace/Splint Knee Immobilizer - Right: On when out of bed or walking Other Brace/Splint: CAM boot  left.      Mobility  Bed Mobility Overal bed mobility: Needs Assistance Bed Mobility: Supine to Sit     Supine to sit: Min assist     General bed mobility comments: support left leg  Transfers Overall transfer level: Needs assistance Equipment used: Rolling walker (2 wheeled) Transfers: Sit to/from [employer] Sit to Stand: Mod assist Stand pivot transfers: Mod assist       General transfer comment: patient has Cam boot on. Patient lost balance x 1 due to not bearing weight on the right leg while transferring to recliner.  Ambulation/Gait                Stairs            Wheelchair Mobility    Modified Rankin (Stroke Patients Only)       Balance Overall balance assessment: History of Falls;Needs assistance   Sitting balance-Leahy Scale: Good     Standing balance support: During functional activity;Bilateral upper extremity supported Standing balance-Leahy Scale: Poor Standing balance comment:  did better when placed weight on right leg                             Pertinent Vitals/Pain Pain Assessment: 0-10 Pain Score: 8  Pain Descriptors / Indicators: Aching;Discomfort;Grimacing;Sore Pain Intervention(s): Monitored during session;Limited activity within patient's tolerance;Patient requesting pain meds-RN notified;Premedicated before session;Repositioned;Ice applied    Home Living Family/patient expects to be discharged to:: Private residence Living Arrangements: Spouse/significant other Available Help at Discharge: Family;Available PRN/intermittently Type of Home: House Home Access: Stairs to enter Entrance Stairs-Rails: Right Entrance Stairs-Number of Steps: 6 Home Layout: One level Home Equipment: Walker - 2 wheels;Walker - 4 wheels;Cane - single point;Bedside commode      Prior Function Level of Independence: Independent with assistive device(s)               Hand Dominance        Extremity/Trunk Assessment   Upper Extremity Assessment Upper Extremity Assessment: Overall WFL for tasks assessed    Lower Extremity Assessment Lower Extremity Assessment: RLE deficits/detail RLE Deficits / Details: 15-40 knee flexion       Communication   Communication: No difficulties  Cognition Arousal/Alertness: Lethargic;Suspect due to medications Behavior During Therapy: [facility] for tasks assessed/performed;Restless Overall Cognitive Status: Within Functional Limits for tasks assessed  General Comments      Exercises Total Joint Exercises Ankle Circles/Pumps: AROM;Both;10 reps;Seated Quad Sets: AROM;Both;10 reps;Seated Heel Slides: AAROM;Right;10 reps;Supine Hip ABduction/ADduction: AAROM;Right;10 reps;Supine Straight Leg Raises: AAROM;Right;10 reps;Supine   Assessment/Plan    PT Assessment Patient needs continued PT services  PT Problem List Decreased strength;Decreased range of  motion;Decreased activity tolerance;Decreased mobility;Decreased knowledge of precautions;Decreased safety awareness;Decreased knowledge of use of DME;Decreased cognition       PT Treatment Interventions DME instruction;Therapeutic exercise;Gait training;Stair training;Functional mobility training;Therapeutic activities;Patient/family education    PT Goals (Current goals can be found in the Care Plan section)  Acute Rehab PT Goals Patient Stated Goal: to walk again PT Goal Formulation: With patient Time For Goal Achievement: 01/17/18 Potential to Achieve Goals: Good    Frequency 7X/week   Barriers to discharge        Co-evaluation               AM-PAC PT "6 Clicks" Daily Activity  Outcome Measure Difficulty turning over in bed (including adjusting bedclothes, sheets and blankets)?: A Lot Difficulty moving from lying on back to sitting on the side of the bed? : A Lot Difficulty sitting down on and standing up from a chair with arms (e.g., wheelchair, bedside commode, etc,.)?: Unable Help needed moving to and from a bed to chair (including a wheelchair)?: Total Help needed walking in hospital room?: Total Help needed climbing 3-5 steps with a railing? : Total 6 Click Score: 8    End of Session Equipment Utilized During Treatment: Right knee immobilizer Activity Tolerance: Patient limited by pain Patient left: in chair;with call bell/phone within reach;with chair alarm set Nurse Communication: Mobility status PT Visit Diagnosis: Unsteadiness on feet (R26.81)    Time: 4098-1191 PT Time Calculation (min) (ACUTE ONLY): 45 min   Charges:   PT Evaluation $PT Eval Low Complexity: 1 Low PT Treatments $Gait Training: 8-22 mins $Therapeutic Exercise: 8-22 mins        [name] PT Acute Rehabilitation Services Pager [phone] Office [phone]   [name] 01/10/2018, 1:14 PM

## 2018-01-10 NOTE — Progress Notes (Signed)
   Subjective: 1 Day Post-Op Procedure(s) (LRB): RIGHT TOTAL KNEE REVISION (Right) Patient reports pain as moderate.   Patient seen in rounds with Dr. Lequita Halt. Patient is well, and has had no acute complaints or problems other than pain in the right knee. Foley catheter removed this AM. Denies chest pain, SOB, or calf pain.  We will start therapy today.   Objective: Vital signs in last 24 hours: Temp:  [97.4 F (36.3 C)-98.8 F (37.1 C)] 98.3 F (36.8 C) (10/31 0438) Pulse Rate:  [40-124] 70 (10/31 0438) Resp:  [9-20] 18 (10/31 0438) BP: (93-142)/(60-107) 108/64 (10/31 0438) SpO2:  [91 %-100 %] 99 % (10/31 0438) Weight:  [102.5 kg] 102.5 kg (10/30 1640)  Intake/Output from previous day:  Intake/Output Summary (Last 24 hours) at 01/10/2018 0808 Last data filed at 01/10/2018 6962 Gross per 24 hour  Intake 2264.42 ml  Output 1875 ml  Net 389.42 ml    Labs: Recent Labs    01/10/18 0522  HGB 10.6*   Recent Labs    01/10/18 0522  WBC 8.7  RBC 4.17  HCT 36.0  PLT 196   Recent Labs    01/10/18 0522  NA 140  K 3.5  CL 103  CO2 32  BUN 14  CREATININE 0.90  GLUCOSE 102*  CALCIUM 8.5*   Exam: General - Patient is Alert and Oriented Extremity - Neurologically intact Neurovascular intact Sensation intact distally Dorsiflexion/Plantar flexion intact Dressing - dressing C/D/I Motor Function - intact, moving foot and toes well on exam.   Past Medical History:  Diagnosis Date  . Anemia   . Anxiety   . Bronchitis    hx of   . Cervical pain (neck)   . Chronic pancreatitis (HCC)   . Closed fracture of base of fifth metatarsal bone of right foot at metaphyseal-diaphyseal junction   . Closed fracture of left lateral malleolus 12/29/2017   saw podiatry at [facility] and was  prescribed bone growth stimulator device to use before she has knee surgery   . Constipation   . DDD (degenerative disc disease), cervical   . Depression   . GERD (gastroesophageal reflux  disease)   . History of kidney stones   . History of pancreatitis 01-04-2011  . Hypothyroidism   . IBS (irritable bowel syndrome)   . Nausea DUE TO PAIN FROM KIDNEY STONE  . Pancreatic pseudocyst   . Pneumonia    hx of pneumonia   . PONV (postoperative nausea and vomiting)   . Right flank pain   . Right knee pain   . Right ureteral stone   . Swelling of right knee joint   . Urinary tract infection    hx of     Assessment/Plan: 1 Day Post-Op Procedure(s) (LRB): RIGHT TOTAL KNEE REVISION (Right) Principal Problem:   Failed total knee arthroplasty (HCC)  Estimated body mass index is 38.79 kg/m as calculated from the following:   Height as of this encounter: 5\' 4"  (1.626 m).   Weight as of this encounter: 102.5 kg. Advance diet Up with therapy  DVT Prophylaxis - Aspirin Weight bearing as tolerated. D/C O2 and pulse ox and try on room air. Hemovac pulled without difficulty, will begin therapy today.  Plan is to go Home after hospital stay. Plan for discharge tomorrow if progresses with therapy and meeting goals.   Arther Abbott, PA-C Orthopedic Surgery 01/10/2018, 8:08 AM

## 2018-01-10 NOTE — Care Management Note (Signed)
Case Management Note  Patient Details  Name: JEWELENE MAIRENA MRN: [id number] Date of Birth: [birth year]  Subjective/Objective:    Discharge planning, spoke with patient and spouse at bedside. Have chosen [facility] for [facility] PT, evaluate and treat.   Action/Plan: Contacted [facility] for referral. They have accepted. Has RW and 3n1. [phone]                Expected Discharge Date:  01/10/18               Expected Discharge Plan:  Home w Home Health Services  In-House Referral:  NA  Discharge planning Services  CM Consult  Post Acute Care Choice:  Home Health Choice offered to:  Patient  DME Arranged:  N/A DME Agency:  NA  HH Arranged:  PT HH Agency:  [facility] ([facility]  Status of Service:  Completed, signed off  If discussed at [employer], dates discussed:    Additional Comments:  Alexis Goodell, RN 01/10/2018, 9:34 AM

## 2018-01-10 NOTE — Progress Notes (Signed)
Wasted morphine 1 mg at 6am with charge nurse david.  We were signed in to pyxis and wasted it properly.  Apparently we didn't save it because it still showed up as undocumented waste.  Contacted pharmacy and they said to put a note in pt's chart.

## 2018-01-10 NOTE — Progress Notes (Signed)
Writer has given pt multiple medications this shift in attempts to control her pain to the right knee.  Patient called the dr herself without consulting staff.  Dr. Then called me and discussed options for better pain control. Dr will enter new orders.

## 2018-01-10 NOTE — Progress Notes (Signed)
Physical Therapy Treatment Patient Details Name: [name] MRN: [id number] DOB: 10/07/1965 Today's Date: 01/10/2018    History of Present Illness 52 yo female S/P R TKA revision, Left lateral malleolus fx(CAM boot), Right fifth MT fracture. H/O left TKA, cervical and lumbar fusion.    PT Comments    The patient had been premedicated with IV meds and was drowsy but did ambulate x 15' Continue PT   Follow Up Recommendations  Follow surgeon's recommendation for DC plan and follow-up therapies     Equipment Recommendations  None recommended by PT    Recommendations for Other Services       Precautions / Restrictions Precautions Precautions: Fall;Knee Required Braces or Orthoses: Knee Immobilizer - Right;Other Brace/Splint Knee Immobilizer - Right: On when out of bed or walking Other Brace/Splint: CAM boot  left. post op shoe on R    Mobility  Bed Mobility   Bed Mobility: Supine to Sit;Sit to Supine     Supine to sit: Min assist Sit to supine: Min assist   General bed mobility comments: support right leg  Transfers Overall transfer level: Needs assistance Equipment used: Rolling walker (2 wheeled) Transfers: Sit to/from Stand Sit to Stand: Mod assist Stand pivot transfers: Mod assist       General transfer comment: patient has Cam boot on left, post op shoe on right. Did not use KI but would have been safer.  Ambulation/Gait Ambulation/Gait assistance: Mod assist;+2 safety/equipment Gait Distance (Feet): 15 Feet Assistive device: Rolling walker (2 wheeled) Gait Pattern/deviations: Step-to pattern;Antalgic;Decreased step length - right;Decreased stance time - right     General Gait Details: patient is sleepy, spouse present and providing direction to patient. Patient is unsteady without KI.    Stairs             Wheelchair Mobility    Modified Rankin (Stroke Patients Only)       Balance                                             Cognition Arousal/Alertness: Awake/alert                                            Exercises      General Comments        Pertinent Vitals/Pain Pain Score: 7  Pain Location: right knee Pain Descriptors / Indicators: Aching;Discomfort;Grimacing;Sore Pain Intervention(s): Monitored during session;Premedicated before session;Repositioned;Ice applied    Home Living                      Prior Function            PT Goals (current goals can now be found in the care plan section) Progress towards PT goals: Progressing toward goals    Frequency    7X/week      PT Plan Current plan remains appropriate    Co-evaluation              AM-PAC PT "6 Clicks" Daily Activity  Outcome Measure  Difficulty turning over in bed (including adjusting bedclothes, sheets and blankets)?: A Lot Difficulty moving from lying on back to sitting on the side of the bed? : A Lot Difficulty sitting down on and standing up from a  chair with arms (e.g., wheelchair, bedside commode, etc,.)?: Unable Help needed moving to and from a bed to chair (including a wheelchair)?: Total Help needed walking in hospital room?: Total Help needed climbing 3-5 steps with a railing? : Total 6 Click Score: 8    End of Session   Activity Tolerance: Patient limited by pain Patient left: in bed;with call bell/phone within reach;with bed alarm set;with family/visitor present Nurse Communication: Mobility status       Time: 1027-2536 PT Time Calculation (min) (ACUTE ONLY): 19 min  Charges:  $Gait Training: 8-22 mins                     Blanchard Kelch PT Acute Rehabilitation Services Pager [phone] Office [phone]     Rada Hay 01/10/2018, 5:36 PM

## 2018-01-11 LAB — BASIC METABOLIC PANEL
Anion gap: 8 (ref 5–15)
BUN: 8 mg/dL (ref 6–20)
CALCIUM: 8.6 mg/dL — AB (ref 8.9–10.3)
CO2: 29 mmol/L (ref 22–32)
CREATININE: 0.86 mg/dL (ref 0.44–1.00)
Chloride: 98 mmol/L (ref 98–111)
GFR calc Af Amer: >60 mL/min (ref >60)
GFR calc non Af Amer: >60 mL/min (ref >60)
Glucose, Bld: 109 mg/dL — ABNORMAL HIGH (ref 70–99)
Potassium: 3.9 mmol/L (ref 3.5–5.1)
SODIUM: 135 mmol/L (ref 135–145)

## 2018-01-11 LAB — CBC
HCT: 34.9 % — ABNORMAL LOW (ref 36.0–46.0)
Hemoglobin: 10.7 g/dL — ABNORMAL LOW (ref 12.0–15.0)
MCH: 25.8 pg — ABNORMAL LOW (ref 26.0–34.0)
MCHC: 30.7 g/dL (ref 30.0–36.0)
MCV: 84.3 fL (ref 80.0–100.0)
Platelets: 204 10*3/uL (ref 150–400)
RBC: 4.14 MIL/uL (ref 3.87–5.11)
RDW: 14.6 % (ref 11.5–15.5)
WBC: 9.1 10*3/uL (ref 4.0–10.5)
nRBC: 0 % (ref 0.0–0.2)

## 2018-01-11 MED ORDER — ACETAMINOPHEN 325 MG PO TABS: 650.0000 mg | ORAL_TABLET | Freq: Four times a day (QID) | ORAL | Status: DC | PRN

## 2018-01-11 NOTE — Progress Notes (Signed)
Physical Therapy Treatment Patient Details Name: [name] MRN: [id number] DOB: May 05, 1965 Today's Date: 01/11/2018    History of Present Illness 52 yo female S/P R TKA revision, Left lateral malleolus fx(CAM boot), Right fifth MT fracture. H/O left TKA, cervical and lumbar fusion.    PT Comments    POD # 2 pm session with spouse present "This is her third knee surgery" stated spouse who was very supportive and knowledgeable.  Assisted with amb a greater distance in hallway and reviewed HEP TE's.   Pt has met goals to D/C to home .  Follow Up Recommendations  Follow surgeon's recommendation for DC plan and follow-up therapies     Equipment Recommendations  None recommended by PT    Recommendations for Other Services       Precautions / Restrictions Precautions Precautions: Fall;Knee Required Braces or Orthoses: Knee Immobilizer - Right;Other Brace/Splint Knee Immobilizer - Right: On when out of bed or walking Other Brace/Splint: CAM boot  left. post op shoe on R Restrictions Weight Bearing Restrictions: No RLE Weight Bearing: Weight bearing as tolerated    Mobility  Bed Mobility Overal bed mobility: Needs Assistance Bed Mobility: Supine to Sit     Supine to sit: Min assist     General bed mobility comments: support right leg  Transfers Overall transfer level: Needs assistance Equipment used: Rolling walker (2 wheeled) Transfers: Sit to/from Stand Sit to Stand: Min guard;Min assist Stand pivot transfers: Min assist       General transfer comment: 25% VC's on proper hand placement and increased time   Ambulation/Gait Ambulation/Gait assistance: Min assist Gait Distance (Feet): 45 Feet Assistive device: Rolling walker (2 wheeled) Gait Pattern/deviations: Step-to pattern;Antalgic;Decreased step length - right;Decreased stance time - right     General Gait Details: shaky and unsteady.  Instructed pt to decrease gait speed and increase upright posture.   Also required VC's for safety with turns and proper walker to self distance.     Stairs Stairs: (pt has a ramp and a wheelchair)           Wheelchair Mobility    Modified Rankin (Stroke Patients Only)       Balance                                            Cognition Arousal/Alertness: Awake/alert Behavior During Therapy: WFL for tasks assessed/performed Overall Cognitive Status: Within Functional Limits for tasks assessed                                        Exercises      General Comments        Pertinent Vitals/Pain Pain Assessment: No/denies pain Pain Score: 5  Pain Location: right knee Pain Descriptors / Indicators: Aching;Discomfort;Grimacing;Sore Pain Intervention(s): Monitored during session;Repositioned;Ice applied;Premedicated before session    Home Living                      Prior Function            PT Goals (current goals can now be found in the care plan section) Progress towards PT goals: Progressing toward goals    Frequency    7X/week      PT Plan Current plan remains appropriate  Co-evaluation              AM-PAC PT "6 Clicks" Daily Activity  Outcome Measure  Difficulty turning over in bed (including adjusting bedclothes, sheets and blankets)?: A Lot Difficulty moving from lying on back to sitting on the side of the bed? : A Lot Difficulty sitting down on and standing up from a chair with arms (e.g., wheelchair, bedside commode, etc,.)?: A Lot Help needed moving to and from a bed to chair (including a wheelchair)?: A Lot Help needed walking in hospital room?: A Lot Help needed climbing 3-5 steps with a railing? : A Lot 6 Click Score: 12    End of Session Equipment Utilized During Treatment: Right knee immobilizer Activity Tolerance: Patient limited by fatigue;No increased pain Patient left: in bed;with call bell/phone within reach;with bed alarm set;with family/visitor  present Nurse Communication: Mobility status(pt will need another PT session with when spouse arrives ) PT Visit Diagnosis: Unsteadiness on feet (R26.81)     Time: 1335-1400 PT Time Calculation (min) (ACUTE ONLY): 25 min  Charges:  $Gait Training: 8-22 mins $Therapeutic Exercise: 8-22 mins                     Rica Koyanagi  PTA Acute  Rehabilitation Services Pager      [phone] Office      [phone]

## 2018-01-11 NOTE — Progress Notes (Signed)
Physical Therapy Treatment Patient Details Name: [name] MRN: [id number] DOB: May 01, 1965 Today's Date: 01/11/2018    History of Present Illness 52 yo female S/P R TKA revision, Left lateral malleolus fx(CAM boot), Right fifth MT fracture. H/O left TKA, cervical and lumbar fusion.    PT Comments    POD #2 am session  Assisted OOB to [facility] then amb in hallway.  shaky and unsteady.  Instructed pt to decrease gait speed and increase upright posture.  Also required VC's for safety with turns and proper walker to self distance.  Returned to room and Performed all supine TE's following HEP handout.  Instructed on proper tech, freq as well as use of ICE.        Follow Up Recommendations  Follow surgeon's recommendation for DC plan and follow-up therapies     Equipment Recommendations  None recommended by PT    Recommendations for Other Services       Precautions / Restrictions Precautions Precautions: Fall;Knee Required Braces or Orthoses: Knee Immobilizer - Right;Other Brace/Splint Knee Immobilizer - Right: On when out of bed or walking Other Brace/Splint: CAM boot  left. post op shoe on R Restrictions Weight Bearing Restrictions: No RLE Weight Bearing: Weight bearing as tolerated    Mobility  Bed Mobility Overal bed mobility: Needs Assistance Bed Mobility: Supine to Sit     Supine to sit: Min assist     General bed mobility comments: support right leg  Transfers Overall transfer level: Needs assistance Equipment used: Rolling walker (2 wheeled) Transfers: Sit to/from Stand Sit to Stand: Min guard;Min assist Stand pivot transfers: Min assist       General transfer comment: 25% VC's on proper hand placement and increased time   Ambulation/Gait Ambulation/Gait assistance: Min assist Gait Distance (Feet): 25 Feet Assistive device: Rolling walker (2 wheeled) Gait Pattern/deviations: Step-to pattern;Antalgic;Decreased step length - right;Decreased stance time -  right     General Gait Details: shaky and unsteady.  Instructed pt to decrease gait speed and increase upright posture.  Also required VC's for safety with turns and proper walker to self distance.     Stairs             Wheelchair Mobility    Modified Rankin (Stroke Patients Only)       Balance                                            Cognition Arousal/Alertness: Awake/alert Behavior During Therapy: WFL for tasks assessed/performed Overall Cognitive Status: Within Functional Limits for tasks assessed                                        Exercises   Total Knee Replacement TE's 10 reps B LE ankle pumps 10 reps towel squeezes 10 reps knee presses 10 reps heel slides  10 reps SAQ's 10 reps SLR's 10 reps ABD Followed by ICE     General Comments        Pertinent Vitals/Pain Pain Assessment: No/denies pain Pain Score: 5  Pain Location: right knee Pain Descriptors / Indicators: Aching;Discomfort;Grimacing;Sore Pain Intervention(s): Monitored during session;Repositioned;Ice applied;Premedicated before session    Home Living  Prior Function            PT Goals (current goals can now be found in the care plan section) Progress towards PT goals: Progressing toward goals    Frequency    7X/week      PT Plan Current plan remains appropriate    Co-evaluation              AM-PAC PT "6 Clicks" Daily Activity  Outcome Measure  Difficulty turning over in bed (including adjusting bedclothes, sheets and blankets)?: A Lot Difficulty moving from lying on back to sitting on the side of the bed? : A Lot Difficulty sitting down on and standing up from a chair with arms (e.g., wheelchair, bedside commode, etc,.)?: A Lot Help needed moving to and from a bed to chair (including a wheelchair)?: A Lot Help needed walking in hospital room?: A Lot Help needed climbing 3-5 steps with a railing?  : A Lot 6 Click Score: 12    End of Session Equipment Utilized During Treatment: Right knee immobilizer Activity Tolerance: Patient limited by fatigue;No increased pain Patient left: in bed;with call bell/phone within reach;with bed alarm set;with family/visitor present Nurse Communication: Mobility status(pt will need another PT session with when spouse arrives ) PT Visit Diagnosis: Unsteadiness on feet (R26.81)     Time: 1610-9604 PT Time Calculation (min) (ACUTE ONLY): 24 min  Charges:  $Gait Training: 8-22 mins $Therapeutic Activity: 8-22 mins                     Felecia Shelling  PTA Acute  Rehabilitation Services Pager      [phone] Office      [phone]

## 2018-01-11 NOTE — Progress Notes (Signed)
   Subjective: 2 Days Post-Op Procedure(s) (LRB): RIGHT TOTAL KNEE REVISION (Right) Patient reports pain as moderate.   Patient seen in rounds for Dr. Lequita Halt. Patient is well, and has had no acute complaints or problems Plan is to go Home after hospital stay.  Objective: Vital signs in last 24 hours: Temp:  [98.4 F (36.9 C)-100.5 F (38.1 C)] 98.7 F (37.1 C) (11/01 0647) Pulse Rate:  [71-93] 93 (11/01 0647) Resp:  [14-20] 20 (11/01 0647) BP: (100-130)/(57-98) 125/75 (11/01 0647) SpO2:  [91 %-100 %] 91 % (11/01 0647)  Intake/Output from previous day:  Intake/Output Summary (Last 24 hours) at 01/11/2018 0809 Last data filed at 01/11/2018 0653 Gross per 24 hour  Intake 480 ml  Output 3700 ml  Net -3220 ml    Intake/Output this shift: No intake/output data recorded.  Labs: Recent Labs    01/10/18 0522 01/11/18 0511  HGB 10.6* 10.7*   Recent Labs    01/10/18 0522 01/11/18 0511  WBC 8.7 9.1  RBC 4.17 4.14  HCT 36.0 34.9*  PLT 196 204   Recent Labs    01/10/18 0522 01/11/18 0511  NA 140 135  K 3.5 3.9  CL 103 98  CO2 32 29  BUN 14 8  CREATININE 0.90 0.86  GLUCOSE 102* 109*  CALCIUM 8.5* 8.6*   No results for input(s): LABPT, INR in the last 72 hours.  Exam: General - Patient is Alert and Oriented Extremity - Neurologically intact Neurovascular intact Sensation intact distally Dorsiflexion/Plantar flexion intact Dressing/Incision - clean, dry, no drainage Motor Function - intact, moving foot and toes well on exam.   Past Medical History:  Diagnosis Date  . Anemia   . Anxiety   . Bronchitis    hx of   . Cervical pain (neck)   . Chronic pancreatitis (HCC)   . Closed fracture of base of fifth metatarsal bone of right foot at metaphyseal-diaphyseal junction   . Closed fracture of left lateral malleolus 12/29/2017   saw podiatry at [facility] and was  prescribed bone growth stimulator device to use before she has knee surgery   . Constipation     . DDD (degenerative disc disease), cervical   . Depression   . GERD (gastroesophageal reflux disease)   . History of kidney stones   . History of pancreatitis 01-04-2011  . Hypothyroidism   . IBS (irritable bowel syndrome)   . Nausea DUE TO PAIN FROM KIDNEY STONE  . Pancreatic pseudocyst   . Pneumonia    hx of pneumonia   . PONV (postoperative nausea and vomiting)   . Right flank pain   . Right knee pain   . Right ureteral stone   . Swelling of right knee joint   . Urinary tract infection    hx of     Assessment/Plan: 2 Days Post-Op Procedure(s) (LRB): RIGHT TOTAL KNEE REVISION (Right) Principal Problem:   Failed total knee arthroplasty (HCC)  Estimated body mass index is 38.79 kg/m as calculated from the following:   Height as of this encounter: 5\' 4"  (1.626 m).   Weight as of this encounter: 102.5 kg. Up with therapy D/C IV fluids  DVT Prophylaxis - Aspirin Weight-bearing as tolerated  Plan for discharge today if progresses with therapy and tolerating oral pain medications. Discontinued IV pain medications this AM. Scheduled for HHPT. Follow-up in the office in 2 weeks with Dr. Lequita Halt.  Arther Abbott, PA-C Orthopedic Surgery 01/11/2018, 8:09 AM

## 2018-01-12 DIAGNOSIS — K861 Other chronic pancreatitis: Secondary | ICD-10-CM | POA: Diagnosis not present

## 2018-01-12 DIAGNOSIS — E669 Obesity, unspecified: Secondary | ICD-10-CM | POA: Diagnosis not present

## 2018-01-12 DIAGNOSIS — Z7982 Long term (current) use of aspirin: Secondary | ICD-10-CM | POA: Diagnosis not present

## 2018-01-12 DIAGNOSIS — T84022D Instability of internal right knee prosthesis, subsequent encounter: Secondary | ICD-10-CM | POA: Diagnosis not present

## 2018-01-12 DIAGNOSIS — Z6838 Body mass index (BMI) 38.0-38.9, adult: Secondary | ICD-10-CM | POA: Diagnosis not present

## 2018-01-12 DIAGNOSIS — Z87440 Personal history of urinary (tract) infections: Secondary | ICD-10-CM | POA: Diagnosis not present

## 2018-01-12 DIAGNOSIS — F419 Anxiety disorder, unspecified: Secondary | ICD-10-CM | POA: Diagnosis not present

## 2018-01-12 DIAGNOSIS — Z8701 Personal history of pneumonia (recurrent): Secondary | ICD-10-CM | POA: Diagnosis not present

## 2018-01-12 DIAGNOSIS — Z981 Arthrodesis status: Secondary | ICD-10-CM | POA: Diagnosis not present

## 2018-01-12 DIAGNOSIS — M5030 Other cervical disc degeneration, unspecified cervical region: Secondary | ICD-10-CM | POA: Diagnosis not present

## 2018-01-12 DIAGNOSIS — Z96652 Presence of left artificial knee joint: Secondary | ICD-10-CM | POA: Diagnosis not present

## 2018-01-12 DIAGNOSIS — F329 Major depressive disorder, single episode, unspecified: Secondary | ICD-10-CM | POA: Diagnosis not present

## 2018-01-12 DIAGNOSIS — Z9181 History of falling: Secondary | ICD-10-CM | POA: Diagnosis not present

## 2018-01-14 NOTE — Discharge Summary (Signed)
Physician Discharge Summary   Patient ID: [name] MRN: [id number] DOB/AGE: May 29, 1965 52 y.o.  Admit date: 01/09/2018 Discharge date: 01/11/2018  Primary Diagnosis: Loose unstable right total knee arthroplasty   Admission Diagnoses:  Past Medical History:  Diagnosis Date  . Anemia   . Anxiety   . Bronchitis    hx of   . Cervical pain (neck)   . Chronic pancreatitis ([place])   . Closed fracture of base of fifth metatarsal bone of right foot at metaphyseal-diaphyseal junction   . Closed fracture of left lateral malleolus 12/29/2017   saw podiatry at [facility] and was  prescribed bone growth stimulator device to use before she has knee surgery   . Constipation   . DDD (degenerative disc disease), cervical   . Depression   . GERD (gastroesophageal reflux disease)   . History of kidney stones   . History of pancreatitis 01-04-2011  . Hypothyroidism   . IBS (irritable bowel syndrome)   . Nausea DUE TO PAIN FROM KIDNEY STONE  . Pancreatic pseudocyst   . Pneumonia    hx of pneumonia   . PONV (postoperative nausea and vomiting)   . Right flank pain   . Right knee pain   . Right ureteral stone   . Swelling of right knee joint   . Urinary tract infection    hx of    Discharge Diagnoses:   Principal Problem:   Failed total knee arthroplasty ([place])  Estimated body mass index is 38.79 kg/m as calculated from the following:   Height as of this encounter: 5' 4"  (1.626 m).   Weight as of this encounter: 102.5 kg.  Procedure:  Procedure(s) (LRB): RIGHT TOTAL KNEE REVISION (Right)   Consults: None  HPI: The patient is a 51 year old female who had a total knee arthroplasty done several years ago.  It has collapsed into progressive valgus, and it is very unstable and very painful for her.  The tibial component is obviously loose as it has migrated.  She presents now for total knee arthroplasty revision.  Laboratory Data: Admission on 01/09/2018, Discharged on 01/11/2018    Component Date Value Ref Range Status  . WBC 01/10/2018 8.7  4.0 - 10.5 K/uL Final  . RBC 01/10/2018 4.17  3.87 - 5.11 MIL/uL Final  . Hemoglobin 01/10/2018 10.6* 12.0 - 15.0 g/dL Final  . HCT 01/10/2018 36.0  36.0 - 46.0 % Final  . MCV 01/10/2018 86.3  80.0 - 100.0 fL Final  . MCH 01/10/2018 25.4* 26.0 - 34.0 pg Final  . MCHC 01/10/2018 29.4* 30.0 - 36.0 g/dL Final  . RDW 01/10/2018 15.0  11.5 - 15.5 % Final  . Platelets 01/10/2018 196  150 - 400 K/uL Final  . nRBC 01/10/2018 0.0  0.0 - 0.2 % Final   Performed at [facility], [place] [address], [place], [place] [address]  . Sodium 01/10/2018 140  135 - 145 mmol/L Final  . Potassium 01/10/2018 3.5  3.5 - 5.1 mmol/L Final  . Chloride 01/10/2018 103  98 - 111 mmol/L Final  . CO2 01/10/2018 32  22 - 32 mmol/L Final  . Glucose, Bld 01/10/2018 102* 70 - 99 mg/dL Final  . BUN 01/10/2018 14  6 - 20 mg/dL Final  . Creatinine, Ser 01/10/2018 0.90  0.44 - 1.00 mg/dL Final  . Calcium 01/10/2018 8.5* 8.9 - 10.3 mg/dL Final  . GFR calc non Af Amer 01/10/2018 >60  >60 mL/min Final  . GFR calc Af  Amer 01/10/2018 >60  >60 mL/min Final   Comment: (NOTE) The eGFR has been calculated using the CKD EPI equation. This calculation has not been validated in all clinical situations. eGFR's persistently <60 mL/min signify possible Chronic Kidney Disease.   Georgiann Hahn gap 01/10/2018 5  5 - 15 Final   Performed at [facility], [place] [address] [place], [place] [address]  . WBC 01/11/2018 9.1  4.0 - 10.5 K/uL Final  . RBC 01/11/2018 4.14  3.87 - 5.11 MIL/uL Final  . Hemoglobin 01/11/2018 10.7* 12.0 - 15.0 g/dL Final  . HCT 01/11/2018 34.9* 36.0 - 46.0 % Final  . MCV 01/11/2018 84.3  80.0 - 100.0 fL Final  . MCH 01/11/2018 25.8* 26.0 - 34.0 pg Final  . MCHC 01/11/2018 30.7  30.0 - 36.0 g/dL Final  . RDW 01/11/2018 14.6  11.5 - 15.5 % Final  . Platelets 01/11/2018 204  150 - 400 K/uL Final  . nRBC 01/11/2018 0.0  0.0 - 0.2  % Final   Performed at [facility], [place] [address] [place], [place] [address]  . Sodium 01/11/2018 135  135 - 145 mmol/L Final  . Potassium 01/11/2018 3.9  3.5 - 5.1 mmol/L Final  . Chloride 01/11/2018 98  98 - 111 mmol/L Final  . CO2 01/11/2018 29  22 - 32 mmol/L Final  . Glucose, Bld 01/11/2018 109* 70 - 99 mg/dL Final  . BUN 01/11/2018 8  6 - 20 mg/dL Final  . Creatinine, Ser 01/11/2018 0.86  0.44 - 1.00 mg/dL Final  . Calcium 01/11/2018 8.6* 8.9 - 10.3 mg/dL Final  . GFR calc non Af Amer 01/11/2018 >60  >60 mL/min Final  . GFR calc Af Amer 01/11/2018 >60  >60 mL/min Final   Comment: (NOTE) The eGFR has been calculated using the CKD EPI equation. This calculation has not been validated in all clinical situations. eGFR's persistently <60 mL/min signify possible Chronic Kidney Disease.   Georgiann Hahn gap 01/11/2018 8  5 - 15 Final   Performed at [facility], [place] [address] [place], [place] [address]  Hospital Outpatient Visit on 01/04/2018  Component Date Value Ref Range Status  . aPTT 01/04/2018 37* 24 - 36 seconds Final   Comment:        IF BASELINE aPTT IS ELEVATED, SUGGEST PATIENT RISK ASSESSMENT BE USED TO DETERMINE APPROPRIATE ANTICOAGULANT THERAPY. Performed at [facility], [place] [address], [place], [place] [address]   . WBC 01/04/2018 6.4  4.0 - 10.5 K/uL Final  . RBC 01/04/2018 4.89  3.87 - 5.11 MIL/uL Final  . Hemoglobin 01/04/2018 12.5  12.0 - 15.0 g/dL Final  . HCT 01/04/2018 41.4  36.0 - 46.0 % Final  . MCV 01/04/2018 84.7  80.0 - 100.0 fL Final  . MCH 01/04/2018 25.6* 26.0 - 34.0 pg Final  . MCHC 01/04/2018 30.2  30.0 - 36.0 g/dL Final  . RDW 01/04/2018 14.6  11.5 - 15.5 % Final  . Platelets 01/04/2018 250  150 - 400 K/uL Final  . nRBC 01/04/2018 0.0  0.0 - 0.2 % Final   Performed at [facility], [place] [address] [place], [place] [address]  . Sodium 01/04/2018 139  135 - 145 mmol/L Final   . Potassium 01/04/2018 3.5  3.5 - 5.1 mmol/L Final  . Chloride 01/04/2018 101  98 - 111 mmol/L Final  . CO2 01/04/2018 31  22 - 32 mmol/L Final  . Glucose, Bld 01/04/2018  102* 70 - 99 mg/dL Final  . BUN 01/04/2018 14  6 - 20 mg/dL Final  . Creatinine, Ser 01/04/2018 0.95  0.44 - 1.00 mg/dL Final  . Calcium 01/04/2018 9.2  8.9 - 10.3 mg/dL Final  . Total Protein 01/04/2018 6.8  6.5 - 8.1 g/dL Final  . Albumin 01/04/2018 3.9  3.5 - 5.0 g/dL Final  . AST 01/04/2018 22  15 - 41 U/L Final  . ALT 01/04/2018 14  0 - 44 U/L Final  . Alkaline Phosphatase 01/04/2018 93  38 - 126 U/L Final  . Total Bilirubin 01/04/2018 0.9  0.3 - 1.2 mg/dL Final  . GFR calc non Af Amer 01/04/2018 >60  >60 mL/min Final  . GFR calc Af Amer 01/04/2018 >60  >60 mL/min Final   Comment: (NOTE) The eGFR has been calculated using the CKD EPI equation. This calculation has not been validated in all clinical situations. eGFR's persistently <60 mL/min signify possible Chronic Kidney Disease.   Georgiann Hahn gap 01/04/2018 7  5 - 15 Final   Performed at [facility], [place] [address], [place], [place] [address]  . Prothrombin Time 01/04/2018 12.3  11.4 - 15.2 seconds Final  . INR 01/04/2018 0.92   Final   Performed at [facility], [place] [address], [place], [place] [address]  . ABO/RH(D) 01/04/2018 B POS   Final  . Antibody Screen 01/04/2018 NEG   Final  . Sample Expiration 01/04/2018 01/12/2018   Final  . Extend sample reason 01/04/2018    Final                   Value:NO TRANSFUSIONS OR PREGNANCY IN THE PAST 3 MONTHS Performed at [place] [address] [place], [place] [address]   . MRSA, PCR 01/04/2018 NEGATIVE  NEGATIVE Final  . Staphylococcus aureus 01/04/2018 NEGATIVE  NEGATIVE Final   Comment: (NOTE) The Xpert SA Assay (FDA approved for NASAL specimens in patients 57 years of age and older), is one component of a comprehensive surveillance program. It is  not intended to diagnose infection nor to guide or monitor treatment. Performed at [facility], [place] [address], [place], [place] [address]      X-Rays:No results found.  EKG: Orders placed or performed during the hospital encounter of 10/29/12  . ED EKG   (only if pt is 52 y.o. or older and pain is above umbilicus)  . ED EKG   (only if pt is 52 y.o. or older and pain is above umbilicus)  . EKG     Hospital Course: DANYIA BORUNDA is a 52 y.o. who was admitted to [facility]. They were brought to the operating room on 01/09/2018 and underwent Procedure(s): RIGHT TOTAL KNEE REVISION.  Patient tolerated the procedure well and was later transferred to the recovery room and then to the orthopaedic floor for postoperative care. They were given PO and IV analgesics for pain control following their surgery. They were given 24 hours of postoperative antibiotics of  Anti-infectives (From admission, onward)   Start     Dose/Rate Route Frequency Ordered Stop   01/09/18 2000  ceFAZolin (ANCEF) IVPB 2g/100 mL premix     2 g 200 mL/hr over 30 Minutes Intravenous Every 6 hours 01/09/18 1703 01/10/18 0308   01/09/18 1115  ceFAZolin (ANCEF) IVPB 2g/100 mL premix     2 g 200 mL/hr over 30 Minutes Intravenous On call to O.R. 01/09/18 1111 01/09/18  1331     and started on DVT prophylaxis in the form of Aspirin.   PT and OT were ordered for total joint protocol. Discharge planning consulted to help with postop disposition and equipment needs. Patient had a decent night on the evening of surgery. They started to get up OOB with therapy on POD #1. Hemovac drain was pulled without difficulty on day one. Continued to work with therapy into POD #2. Pt was seen during rounds on day two and was ready to go home pending progress with therapy. Dressing was changed and the incision was clean, dry, and intact with no drainage. Pt worked with therapy for two additional sessions and was meeting  their goals. She was discharged to home later that day in stable condition.  Diet: Regular diet Activity: WBAT Follow-up: in 2 weeks with Dr. Wynelle Link Disposition: Home with HHPT Discharged Condition: stable   Discharge Instructions    Call MD / Call 911   Complete by:  As directed    If you experience chest pain or shortness of breath, CALL 911 and be transported to the hospital emergency room.  If you develope a fever above 101 F, pus (white drainage) or increased drainage or redness at the wound, or calf pain, call your surgeon's office.   Change dressing   Complete by:  As directed    Change the dressing daily with sterile 4 x 4 inch gauze dressing and apply TED hose.   Constipation Prevention   Complete by:  As directed    Drink plenty of fluids.  Prune juice may be helpful.  You may use a stool softener, such as Colace (over the counter) 100 mg twice a day.  Use MiraLax (over the counter) for constipation as needed.   Diet - low sodium heart healthy   Complete by:  As directed    Discharge instructions   Complete by:  As directed    Dr. Gaynelle Arabian Total Joint Specialist [facility] [address] [place], [place] [address] [phone]  TOTAL KNEE REPLACEMENT POSTOPERATIVE DIRECTIONS  Knee Rehabilitation, Guidelines Following Surgery  Results after knee surgery are often greatly improved when you follow the exercise, range of motion and muscle strengthening exercises prescribed by your doctor. Safety measures are also important to protect the knee from further injury. Any time any of these exercises cause you to have increased pain or swelling in your knee joint, decrease the amount until you are comfortable again and slowly increase them. If you have problems or questions, call your caregiver or physical therapist for advice.   HOME CARE INSTRUCTIONS  Remove items at home which could result in a fall. This includes throw rugs or furniture in walking pathways.   ICE to the affected knee every three hours for 30 minutes at a time and then as needed for pain and swelling.  Continue to use ice on the knee for pain and swelling from surgery. You may notice swelling that will progress down to the foot and ankle.  This is normal after surgery.  Elevate the leg when you are not up walking on it.   Continue to use the breathing machine which will help keep your temperature down.  It is common for your temperature to cycle up and down following surgery, especially at night when you are not up moving around and exerting yourself.  The breathing machine keeps your lungs expanded and your temperature down. Do not place pillow under knee, focus on keeping  the knee straight while resting   DIET You may resume your previous home diet once your are discharged from the hospital.  DRESSING / WOUND CARE / SHOWERING You may shower 3 days after surgery, but keep the wounds dry during showering.  You may use an occlusive plastic wrap (Press'n Seal for example), NO SOAKING/SUBMERGING IN THE BATHTUB.  If the bandage gets wet, change with a clean dry gauze.  If the incision gets wet, pat the wound dry with a clean towel. You may start showering once you are discharged home but do not submerge the incision under water. Just pat the incision dry and apply a dry gauze dressing on daily. Change the surgical dressing daily and reapply a dry dressing each time.  ACTIVITY Walk with your walker as instructed. Use walker as long as suggested by your caregivers. Avoid periods of inactivity such as sitting longer than an hour when not asleep. This helps prevent blood clots.  You may resume a sexual relationship in one month or when given the OK by your doctor.  You may return to work once you are cleared by your doctor.  Do not drive a car for 6 weeks or until released by you surgeon.  Do not drive while taking narcotics.  WEIGHT BEARING Weight bearing as tolerated with assist device  (walker, cane, etc) as directed, use it as long as suggested by your surgeon or therapist, typically at least 4-6 weeks.  POSTOPERATIVE CONSTIPATION PROTOCOL Constipation - defined medically as fewer than three stools per week and severe constipation as less than one stool per week.  One of the most common issues patients have following surgery is constipation.  Even if you have a regular bowel pattern at home, your normal regimen is likely to be disrupted due to multiple reasons following surgery.  Combination of anesthesia, postoperative narcotics, change in appetite and fluid intake all can affect your bowels.  In order to avoid complications following surgery, here are some recommendations in order to help you during your recovery period.  Colace (docusate) - Pick up an over-the-counter form of Colace or another stool softener and take twice a day as long as you are requiring postoperative pain medications.  Take with a full glass of water daily.  If you experience loose stools or diarrhea, hold the colace until you stool forms back up.  If your symptoms do not get better within 1 week or if they get worse, check with your doctor.  Dulcolax (bisacodyl) - Pick up over-the-counter and take as directed by the product packaging as needed to assist with the movement of your bowels.  Take with a full glass of water.  Use this product as needed if not relieved by Colace only.   MiraLax (polyethylene glycol) - Pick up over-the-counter to have on hand.  MiraLax is a solution that will increase the amount of water in your bowels to assist with bowel movements.  Take as directed and can mix with a glass of water, juice, soda, coffee, or tea.  Take if you go more than two days without a movement. Do not use MiraLax more than once per day. Call your doctor if you are still constipated or irregular after using this medication for 7 days in a row.  If you continue to have problems with postoperative constipation,  please contact the office for further assistance and recommendations.  If you experience "the worst abdominal pain ever" or develop nausea or vomiting, please contact the office immediatly  for further recommendations for treatment.  ITCHING  If you experience itching with your medications, try taking only a single pain pill, or even half a pain pill at a time.  You can also use Benadryl over the counter for itching or also to help with sleep.   TED HOSE STOCKINGS Wear the elastic stockings on both legs for three weeks following surgery during the day but you may remove then at night for sleeping.  MEDICATIONS See your medication summary on the "After Visit Summary" that the nursing staff will review with you prior to discharge.  You may have some home medications which will be placed on hold until you complete the course of blood thinner medication.  It is important for you to complete the blood thinner medication as prescribed by your surgeon.  Continue your approved medications as instructed at time of discharge.  PRECAUTIONS If you experience chest pain or shortness of breath - call 911 immediately for transfer to the hospital emergency department.  If you develop a fever greater that 101 F, purulent drainage from wound, increased redness or drainage from wound, foul odor from the wound/dressing, or calf pain - CONTACT YOUR SURGEON.                                                   FOLLOW-UP APPOINTMENTS Make sure you keep all of your appointments after your operation with your surgeon and caregivers. You should call the office at the above phone number and make an appointment for approximately two weeks after the date of your surgery or on the date instructed by your surgeon outlined in the "After Visit Summary".   RANGE OF MOTION AND STRENGTHENING EXERCISES  Rehabilitation of the knee is important following a knee injury or an operation. After just a few days of immobilization, the muscles  of the thigh which control the knee become weakened and shrink (atrophy). Knee exercises are designed to build up the tone and strength of the thigh muscles and to improve knee motion. Often times heat used for twenty to thirty minutes before working out will loosen up your tissues and help with improving the range of motion but do not use heat for the first two weeks following surgery. These exercises can be done on a training (exercise) mat, on the floor, on a table or on a bed. Use what ever works the best and is most comfortable for you Knee exercises include:  Leg Lifts - While your knee is still immobilized in a splint or cast, you can do straight leg raises. Lift the leg to 60 degrees, hold for 3 sec, and slowly lower the leg. Repeat 10-20 times 2-3 times daily. Perform this exercise against resistance later as your knee gets better.  Quad and Hamstring Sets - Tighten up the muscle on the front of the thigh (Quad) and hold for 5-10 sec. Repeat this 10-20 times hourly. Hamstring sets are done by pushing the foot backward against an object and holding for 5-10 sec. Repeat as with quad sets.  Leg Slides: Lying on your back, slowly slide your foot toward your buttocks, bending your knee up off the floor (only go as far as is comfortable). Then slowly slide your foot back down until your leg is flat on the floor again. Angel Wings: Lying on your back spread your legs  to the side as far apart as you can without causing discomfort.  A rehabilitation program following serious knee injuries can speed recovery and prevent re-injury in the future due to weakened muscles. Contact your doctor or a physical therapist for more information on knee rehabilitation.   IF YOU ARE TRANSFERRED TO A SKILLED REHAB FACILITY If the patient is transferred to a skilled rehab facility following release from the hospital, a list of the current medications will be sent to the facility for the patient to continue.  When discharged  from the skilled rehab facility, please have the facility set up the patient's [place] prior to being released. Also, the skilled facility will be responsible for providing the patient with their medications at time of release from the facility to include their pain medication, the muscle relaxants, and their blood thinner medication. If the patient is still at the rehab facility at time of the two week follow up appointment, the skilled rehab facility will also need to assist the patient in arranging follow up appointment in our office and any transportation needs.  MAKE SURE YOU:  Understand these instructions.  Get help right away if you are not doing well or get worse.    Pick up stool softner and laxative for home use following surgery while on pain medications. Do not submerge incision under water. Please use good hand washing techniques while changing dressing each day. May shower starting three days after surgery. Please use a clean towel to pat the incision dry following showers. Continue to use ice for pain and swelling after surgery. Do not use any lotions or creams on the incision until instructed by your surgeon.   Do not put a pillow under the knee. Place it under the heel.   Complete by:  As directed    Driving restrictions   Complete by:  As directed    No driving for two weeks   TED hose   Complete by:  As directed    Use stockings (TED hose) for three weeks on both leg(s).  You may remove them at night for sleeping.   Weight bearing as tolerated   Complete by:  As directed      Allergies as of 01/11/2018      Reactions   Deltasone [prednisone] Other (See Comments)   Puts her into a coma. Was admitted to ICU for it.   Methylprednisolone Sodium Succ Shortness Of Breath, Other (See Comments)   Sob, Ams   Solu-medrol [methylprednisolone] Shortness Of Breath, Other (See Comments)   Sob, Ams   Fentanyl Other (See Comments)   Unknown   Septra Ds  [sulfamethoxazole-trimethoprim] Other (See Comments)   Unknown   Dilaudid [hydromorphone Hcl] Other (See Comments)   Hallucinations   Zolpidem Tartrate Other (See Comments)   hallucinations      Medication List    TAKE these medications   albuterol 108 (90 Base) MCG/ACT inhaler Commonly known as:  PROVENTIL HFA;VENTOLIN HFA Inhale 2 puffs into the lungs every 4 (four) hours as needed for wheezing or shortness of breath.   ALPRAZolam 1 MG tablet Commonly known as:  XANAX Take 1 mg by mouth 3 (three) times daily as needed for anxiety.   aspirin 325 MG EC tablet Take 1 tablet (325 mg total) by mouth 2 (two) times daily. Take one tablet (325 mg) Aspirin two times a day for three weeks following surgery. Then take one baby Aspirin (81 mg) once a day for three  weeks. Then discontinue aspirin.   diclofenac sodium 1 % Gel Commonly known as:  VOLTAREN Apply 1 application topically 4 (four) times daily as needed (for pain).   docusate sodium 100 MG capsule Commonly known as:  COLACE Take 1 capsule (100 mg total) by mouth 2 (two) times daily. What changed:    how much to take  when to take this  reasons to take this   DULoxetine 60 MG capsule Commonly known as:  CYMBALTA Take 60 mg by mouth 2 (two) times daily.   hydrochlorothiazide 25 MG tablet Commonly known as:  HYDRODIURIL Take 25 mg by mouth daily.   hydrocortisone-pramoxine rectal foam Commonly known as:  PROCTOFOAM-HC Place 1 applicator rectally 2 (two) times daily as needed for hemorrhoids.   levothyroxine 125 MCG tablet Commonly known as:  SYNTHROID, LEVOTHROID Take 125 mcg by mouth daily before breakfast.   lipase/protease/amylase 36000 UNITS Cpep capsule Commonly known as:  CREON Take 36,000 Units by mouth 3 (three) times daily with meals.   loratadine 10 MG tablet Commonly known as:  CLARITIN Take 10 mg by mouth daily as needed for allergies.   methocarbamol 500 MG tablet Commonly known as:   ROBAXIN Take 1 tablet (500 mg total) by mouth every 6 (six) hours as needed for muscle spasms.   nitrofurantoin (macrocrystal-monohydrate) 100 MG capsule Commonly known as:  MACROBID Take 100 mg by mouth daily.   NUCYNTA ER 150 MG Tb12 Generic drug:  Tapentadol HCl Take 150 mg by mouth every 12 (twelve) hours.   ondansetron 4 MG disintegrating tablet Commonly known as:  ZOFRAN-ODT Take 1 tablet (4 mg total) by mouth every 8 (eight) hours as needed for nausea or vomiting.   oxyCODONE 5 MG immediate release tablet Commonly known as:  Oxy IR/ROXICODONE Take 1-2 tablets (5-10 mg total) by mouth every 6 (six) hours as needed for moderate pain (pain score 4-6). What changed:    medication strength  how much to take  when to take this  reasons to take this   polycarbophil 625 MG tablet Commonly known as:  FIBERCON Take 625 mg by mouth 3 (three) times daily.   polyethylene glycol packet Commonly known as:  MIRALAX / GLYCOLAX Take 17 g by mouth daily as needed for moderate constipation.   pregabalin 300 MG capsule Commonly known as:  LYRICA Take 300 mg by mouth 2 (two) times daily.   promethazine 25 MG tablet Commonly known as:  PHENERGAN Take 1 tablet (25 mg total) by mouth 3 (three) times daily as needed for nausea. What changed:    when to take this  reasons to take this   sodium chloride 0.65 % Soln nasal spray Commonly known as:  OCEAN Place 1 spray into both nostrils daily as needed for congestion.   TART CHERRY ADVANCED PO Take 1 capsule by mouth 3 (three) times daily.   URIBEL 118 MG Caps Take 118 mg by mouth 4 (four) times daily as needed (for UTI).   Vitamin D3 2000 units Tabs Take 2,000 Units by mouth daily.            Discharge Care Instructions  (From admission, onward)         Start     Ordered   01/10/18 0000  Weight bearing as tolerated     01/10/18 0812   01/10/18 0000  Change dressing    Comments:  Change the dressing daily with  sterile 4 x 4 inch gauze dressing and apply TED hose.  01/10/18 4090         Follow-up Information    Gaynelle Arabian, MD. Schedule an appointment as soon as possible for a visit on 01/24/2018.   Specialty:  Orthopedic Surgery Contact information: [address] [place] [address] [phone]        [facility] Follow up.   Specialty:  [place] Why:  physical therapy Contact information: [place] [address] [phone]           Signed: Theresa Duty, PA-C Orthopedic Surgery 01/14/2018, 1:02 PM

## 2018-01-15 DIAGNOSIS — Z8701 Personal history of pneumonia (recurrent): Secondary | ICD-10-CM | POA: Diagnosis not present

## 2018-01-15 DIAGNOSIS — E669 Obesity, unspecified: Secondary | ICD-10-CM | POA: Diagnosis not present

## 2018-01-15 DIAGNOSIS — Z981 Arthrodesis status: Secondary | ICD-10-CM | POA: Diagnosis not present

## 2018-01-15 DIAGNOSIS — F419 Anxiety disorder, unspecified: Secondary | ICD-10-CM | POA: Diagnosis not present

## 2018-01-15 DIAGNOSIS — T84022D Instability of internal right knee prosthesis, subsequent encounter: Secondary | ICD-10-CM | POA: Diagnosis not present

## 2018-01-15 DIAGNOSIS — K861 Other chronic pancreatitis: Secondary | ICD-10-CM | POA: Diagnosis not present

## 2018-01-15 DIAGNOSIS — Z7982 Long term (current) use of aspirin: Secondary | ICD-10-CM | POA: Diagnosis not present

## 2018-01-15 DIAGNOSIS — Z96652 Presence of left artificial knee joint: Secondary | ICD-10-CM | POA: Diagnosis not present

## 2018-01-15 DIAGNOSIS — Z6838 Body mass index (BMI) 38.0-38.9, adult: Secondary | ICD-10-CM | POA: Diagnosis not present

## 2018-01-15 DIAGNOSIS — M5030 Other cervical disc degeneration, unspecified cervical region: Secondary | ICD-10-CM | POA: Diagnosis not present

## 2018-01-15 DIAGNOSIS — Z87440 Personal history of urinary (tract) infections: Secondary | ICD-10-CM | POA: Diagnosis not present

## 2018-01-15 DIAGNOSIS — F329 Major depressive disorder, single episode, unspecified: Secondary | ICD-10-CM | POA: Diagnosis not present

## 2018-01-15 DIAGNOSIS — Z9181 History of falling: Secondary | ICD-10-CM | POA: Diagnosis not present

## 2018-01-16 DIAGNOSIS — E669 Obesity, unspecified: Secondary | ICD-10-CM | POA: Diagnosis not present

## 2018-01-16 DIAGNOSIS — K861 Other chronic pancreatitis: Secondary | ICD-10-CM | POA: Diagnosis not present

## 2018-01-16 DIAGNOSIS — F419 Anxiety disorder, unspecified: Secondary | ICD-10-CM | POA: Diagnosis not present

## 2018-01-16 DIAGNOSIS — M5030 Other cervical disc degeneration, unspecified cervical region: Secondary | ICD-10-CM | POA: Diagnosis not present

## 2018-01-16 DIAGNOSIS — Z96652 Presence of left artificial knee joint: Secondary | ICD-10-CM | POA: Diagnosis not present

## 2018-01-16 DIAGNOSIS — Z87440 Personal history of urinary (tract) infections: Secondary | ICD-10-CM | POA: Diagnosis not present

## 2018-01-16 DIAGNOSIS — Z981 Arthrodesis status: Secondary | ICD-10-CM | POA: Diagnosis not present

## 2018-01-16 DIAGNOSIS — T84022D Instability of internal right knee prosthesis, subsequent encounter: Secondary | ICD-10-CM | POA: Diagnosis not present

## 2018-01-16 DIAGNOSIS — Z8701 Personal history of pneumonia (recurrent): Secondary | ICD-10-CM | POA: Diagnosis not present

## 2018-01-16 DIAGNOSIS — Z6838 Body mass index (BMI) 38.0-38.9, adult: Secondary | ICD-10-CM | POA: Diagnosis not present

## 2018-01-16 DIAGNOSIS — F329 Major depressive disorder, single episode, unspecified: Secondary | ICD-10-CM | POA: Diagnosis not present

## 2018-01-16 DIAGNOSIS — Z7982 Long term (current) use of aspirin: Secondary | ICD-10-CM | POA: Diagnosis not present

## 2018-01-16 DIAGNOSIS — Z9181 History of falling: Secondary | ICD-10-CM | POA: Diagnosis not present

## 2018-01-18 DIAGNOSIS — M5030 Other cervical disc degeneration, unspecified cervical region: Secondary | ICD-10-CM | POA: Diagnosis not present

## 2018-01-18 DIAGNOSIS — Z8701 Personal history of pneumonia (recurrent): Secondary | ICD-10-CM | POA: Diagnosis not present

## 2018-01-18 DIAGNOSIS — Z7982 Long term (current) use of aspirin: Secondary | ICD-10-CM | POA: Diagnosis not present

## 2018-01-18 DIAGNOSIS — T84022D Instability of internal right knee prosthesis, subsequent encounter: Secondary | ICD-10-CM | POA: Diagnosis not present

## 2018-01-18 DIAGNOSIS — E669 Obesity, unspecified: Secondary | ICD-10-CM | POA: Diagnosis not present

## 2018-01-18 DIAGNOSIS — Z9181 History of falling: Secondary | ICD-10-CM | POA: Diagnosis not present

## 2018-01-18 DIAGNOSIS — Z87440 Personal history of urinary (tract) infections: Secondary | ICD-10-CM | POA: Diagnosis not present

## 2018-01-18 DIAGNOSIS — F329 Major depressive disorder, single episode, unspecified: Secondary | ICD-10-CM | POA: Diagnosis not present

## 2018-01-18 DIAGNOSIS — Z96652 Presence of left artificial knee joint: Secondary | ICD-10-CM | POA: Diagnosis not present

## 2018-01-18 DIAGNOSIS — K861 Other chronic pancreatitis: Secondary | ICD-10-CM | POA: Diagnosis not present

## 2018-01-18 DIAGNOSIS — F419 Anxiety disorder, unspecified: Secondary | ICD-10-CM | POA: Diagnosis not present

## 2018-01-18 DIAGNOSIS — Z981 Arthrodesis status: Secondary | ICD-10-CM | POA: Diagnosis not present

## 2018-01-18 DIAGNOSIS — Z6838 Body mass index (BMI) 38.0-38.9, adult: Secondary | ICD-10-CM | POA: Diagnosis not present

## 2018-01-21 DIAGNOSIS — E669 Obesity, unspecified: Secondary | ICD-10-CM | POA: Diagnosis not present

## 2018-01-21 DIAGNOSIS — T84022D Instability of internal right knee prosthesis, subsequent encounter: Secondary | ICD-10-CM | POA: Diagnosis not present

## 2018-01-21 DIAGNOSIS — Z9181 History of falling: Secondary | ICD-10-CM | POA: Diagnosis not present

## 2018-01-21 DIAGNOSIS — Z8701 Personal history of pneumonia (recurrent): Secondary | ICD-10-CM | POA: Diagnosis not present

## 2018-01-21 DIAGNOSIS — Z6838 Body mass index (BMI) 38.0-38.9, adult: Secondary | ICD-10-CM | POA: Diagnosis not present

## 2018-01-21 DIAGNOSIS — Z7982 Long term (current) use of aspirin: Secondary | ICD-10-CM | POA: Diagnosis not present

## 2018-01-21 DIAGNOSIS — Z981 Arthrodesis status: Secondary | ICD-10-CM | POA: Diagnosis not present

## 2018-01-21 DIAGNOSIS — F419 Anxiety disorder, unspecified: Secondary | ICD-10-CM | POA: Diagnosis not present

## 2018-01-21 DIAGNOSIS — Z96652 Presence of left artificial knee joint: Secondary | ICD-10-CM | POA: Diagnosis not present

## 2018-01-21 DIAGNOSIS — M5030 Other cervical disc degeneration, unspecified cervical region: Secondary | ICD-10-CM | POA: Diagnosis not present

## 2018-01-21 DIAGNOSIS — F329 Major depressive disorder, single episode, unspecified: Secondary | ICD-10-CM | POA: Diagnosis not present

## 2018-01-21 DIAGNOSIS — K861 Other chronic pancreatitis: Secondary | ICD-10-CM | POA: Diagnosis not present

## 2018-01-21 DIAGNOSIS — Z87440 Personal history of urinary (tract) infections: Secondary | ICD-10-CM | POA: Diagnosis not present

## 2018-02-11 DIAGNOSIS — M961 Postlaminectomy syndrome, not elsewhere classified: Secondary | ICD-10-CM | POA: Diagnosis not present

## 2018-02-11 DIAGNOSIS — G894 Chronic pain syndrome: Secondary | ICD-10-CM | POA: Diagnosis not present

## 2018-02-11 DIAGNOSIS — M47812 Spondylosis without myelopathy or radiculopathy, cervical region: Secondary | ICD-10-CM | POA: Diagnosis not present

## 2018-02-11 DIAGNOSIS — M47817 Spondylosis without myelopathy or radiculopathy, lumbosacral region: Secondary | ICD-10-CM | POA: Diagnosis not present

## 2018-02-12 DIAGNOSIS — M79671 Pain in right foot: Secondary | ICD-10-CM | POA: Diagnosis not present

## 2018-02-12 DIAGNOSIS — Z96651 Presence of right artificial knee joint: Secondary | ICD-10-CM | POA: Diagnosis not present

## 2018-02-12 DIAGNOSIS — M79672 Pain in left foot: Secondary | ICD-10-CM | POA: Diagnosis not present

## 2018-02-12 DIAGNOSIS — Z471 Aftercare following joint replacement surgery: Secondary | ICD-10-CM | POA: Diagnosis not present

## 2018-02-12 DIAGNOSIS — S92354A Nondisplaced fracture of fifth metatarsal bone, right foot, initial encounter for closed fracture: Secondary | ICD-10-CM | POA: Diagnosis not present

## 2018-02-12 DIAGNOSIS — M25572 Pain in left ankle and joints of left foot: Secondary | ICD-10-CM | POA: Diagnosis not present

## 2018-03-29 ENCOUNTER — Other Ambulatory Visit: Payer: Self-pay | Admitting: Family Medicine

## 2018-03-29 DIAGNOSIS — Z1231 Encounter for screening mammogram for malignant neoplasm of breast: Secondary | ICD-10-CM

## 2018-04-12 DIAGNOSIS — G894 Chronic pain syndrome: Secondary | ICD-10-CM | POA: Diagnosis not present

## 2018-04-12 DIAGNOSIS — M47812 Spondylosis without myelopathy or radiculopathy, cervical region: Secondary | ICD-10-CM | POA: Diagnosis not present

## 2018-04-12 DIAGNOSIS — M961 Postlaminectomy syndrome, not elsewhere classified: Secondary | ICD-10-CM | POA: Diagnosis not present

## 2018-04-12 DIAGNOSIS — M47817 Spondylosis without myelopathy or radiculopathy, lumbosacral region: Secondary | ICD-10-CM | POA: Diagnosis not present

## 2018-05-02 ENCOUNTER — Ambulatory Visit: Payer: BLUE CROSS/BLUE SHIELD

## 2018-05-08 ENCOUNTER — Ambulatory Visit
Admission: RE | Admit: 2018-05-08 | Discharge: 2018-05-08 | Disposition: A | Discharge status: Home / Self Care | Payer: BLUE CROSS/BLUE SHIELD | Source: Ambulatory Visit | Attending: Family Medicine | Admitting: Family Medicine

## 2018-05-08 DIAGNOSIS — Z1231 Encounter for screening mammogram for malignant neoplasm of breast: Secondary | ICD-10-CM | POA: Diagnosis not present

## 2018-05-20 DIAGNOSIS — F341 Dysthymic disorder: Secondary | ICD-10-CM | POA: Diagnosis not present

## 2018-05-20 DIAGNOSIS — F411 Generalized anxiety disorder: Secondary | ICD-10-CM | POA: Diagnosis not present

## 2018-05-20 DIAGNOSIS — F605 Obsessive-compulsive personality disorder: Secondary | ICD-10-CM | POA: Diagnosis not present

## 2018-06-01 DIAGNOSIS — F410 Panic disorder [episodic paroxysmal anxiety] without agoraphobia: Secondary | ICD-10-CM | POA: Diagnosis not present

## 2018-06-01 DIAGNOSIS — F3342 Major depressive disorder, recurrent, in full remission: Secondary | ICD-10-CM | POA: Diagnosis not present

## 2018-06-07 DIAGNOSIS — M47812 Spondylosis without myelopathy or radiculopathy, cervical region: Secondary | ICD-10-CM | POA: Diagnosis not present

## 2018-06-07 DIAGNOSIS — Z79891 Long term (current) use of opiate analgesic: Secondary | ICD-10-CM | POA: Diagnosis not present

## 2018-06-07 DIAGNOSIS — M961 Postlaminectomy syndrome, not elsewhere classified: Secondary | ICD-10-CM | POA: Diagnosis not present

## 2018-06-07 DIAGNOSIS — G894 Chronic pain syndrome: Secondary | ICD-10-CM | POA: Diagnosis not present

## 2018-06-07 DIAGNOSIS — M47817 Spondylosis without myelopathy or radiculopathy, lumbosacral region: Secondary | ICD-10-CM | POA: Diagnosis not present

## 2018-06-19 DIAGNOSIS — J0100 Acute maxillary sinusitis, unspecified: Secondary | ICD-10-CM | POA: Diagnosis not present

## 2018-07-05 DIAGNOSIS — J209 Acute bronchitis, unspecified: Secondary | ICD-10-CM | POA: Diagnosis not present

## 2018-07-09 DIAGNOSIS — F411 Generalized anxiety disorder: Secondary | ICD-10-CM | POA: Diagnosis not present

## 2018-07-09 DIAGNOSIS — F605 Obsessive-compulsive personality disorder: Secondary | ICD-10-CM | POA: Diagnosis not present

## 2018-07-09 DIAGNOSIS — F341 Dysthymic disorder: Secondary | ICD-10-CM | POA: Diagnosis not present

## 2018-08-02 DIAGNOSIS — M961 Postlaminectomy syndrome, not elsewhere classified: Secondary | ICD-10-CM | POA: Diagnosis not present

## 2018-08-02 DIAGNOSIS — M47812 Spondylosis without myelopathy or radiculopathy, cervical region: Secondary | ICD-10-CM | POA: Diagnosis not present

## 2018-08-02 DIAGNOSIS — M47817 Spondylosis without myelopathy or radiculopathy, lumbosacral region: Secondary | ICD-10-CM | POA: Diagnosis not present

## 2018-08-02 DIAGNOSIS — G894 Chronic pain syndrome: Secondary | ICD-10-CM | POA: Diagnosis not present

## 2018-08-15 DIAGNOSIS — K589 Irritable bowel syndrome without diarrhea: Secondary | ICD-10-CM | POA: Diagnosis not present

## 2018-08-15 DIAGNOSIS — E559 Vitamin D deficiency, unspecified: Secondary | ICD-10-CM | POA: Diagnosis not present

## 2018-08-15 DIAGNOSIS — Z131 Encounter for screening for diabetes mellitus: Secondary | ICD-10-CM | POA: Diagnosis not present

## 2018-08-15 DIAGNOSIS — K5903 Drug induced constipation: Secondary | ICD-10-CM | POA: Diagnosis not present

## 2018-08-15 DIAGNOSIS — E039 Hypothyroidism, unspecified: Secondary | ICD-10-CM | POA: Diagnosis not present

## 2018-09-24 DIAGNOSIS — Z79899 Other long term (current) drug therapy: Secondary | ICD-10-CM | POA: Diagnosis not present

## 2018-09-25 DIAGNOSIS — M47816 Spondylosis without myelopathy or radiculopathy, lumbar region: Secondary | ICD-10-CM | POA: Diagnosis not present

## 2018-10-07 DIAGNOSIS — M47817 Spondylosis without myelopathy or radiculopathy, lumbosacral region: Secondary | ICD-10-CM | POA: Diagnosis not present

## 2018-10-07 DIAGNOSIS — G894 Chronic pain syndrome: Secondary | ICD-10-CM | POA: Diagnosis not present

## 2018-10-07 DIAGNOSIS — M47812 Spondylosis without myelopathy or radiculopathy, cervical region: Secondary | ICD-10-CM | POA: Diagnosis not present

## 2018-10-07 DIAGNOSIS — M961 Postlaminectomy syndrome, not elsewhere classified: Secondary | ICD-10-CM | POA: Diagnosis not present

## 2018-12-02 DIAGNOSIS — M47812 Spondylosis without myelopathy or radiculopathy, cervical region: Secondary | ICD-10-CM | POA: Diagnosis not present

## 2018-12-02 DIAGNOSIS — G894 Chronic pain syndrome: Secondary | ICD-10-CM | POA: Diagnosis not present

## 2018-12-02 DIAGNOSIS — M47817 Spondylosis without myelopathy or radiculopathy, lumbosacral region: Secondary | ICD-10-CM | POA: Diagnosis not present

## 2018-12-02 DIAGNOSIS — M961 Postlaminectomy syndrome, not elsewhere classified: Secondary | ICD-10-CM | POA: Diagnosis not present

## 2018-12-05 DIAGNOSIS — F3342 Major depressive disorder, recurrent, in full remission: Secondary | ICD-10-CM | POA: Diagnosis not present

## 2018-12-05 DIAGNOSIS — F410 Panic disorder [episodic paroxysmal anxiety] without agoraphobia: Secondary | ICD-10-CM | POA: Diagnosis not present

## 2018-12-23 DIAGNOSIS — R05 Cough: Secondary | ICD-10-CM | POA: Diagnosis not present

## 2018-12-24 DIAGNOSIS — R05 Cough: Secondary | ICD-10-CM | POA: Diagnosis not present

## 2019-01-24 DIAGNOSIS — R109 Unspecified abdominal pain: Secondary | ICD-10-CM | POA: Diagnosis not present

## 2019-01-24 DIAGNOSIS — Z87442 Personal history of urinary calculi: Secondary | ICD-10-CM | POA: Diagnosis not present

## 2019-01-27 DIAGNOSIS — M961 Postlaminectomy syndrome, not elsewhere classified: Secondary | ICD-10-CM | POA: Diagnosis not present

## 2019-01-27 DIAGNOSIS — G894 Chronic pain syndrome: Secondary | ICD-10-CM | POA: Diagnosis not present

## 2019-01-27 DIAGNOSIS — M47817 Spondylosis without myelopathy or radiculopathy, lumbosacral region: Secondary | ICD-10-CM | POA: Diagnosis not present

## 2019-01-27 DIAGNOSIS — Z79891 Long term (current) use of opiate analgesic: Secondary | ICD-10-CM | POA: Diagnosis not present

## 2019-01-27 DIAGNOSIS — M47812 Spondylosis without myelopathy or radiculopathy, cervical region: Secondary | ICD-10-CM | POA: Diagnosis not present

## 2019-01-30 DIAGNOSIS — K5900 Constipation, unspecified: Secondary | ICD-10-CM | POA: Diagnosis not present

## 2019-01-30 DIAGNOSIS — R112 Nausea with vomiting, unspecified: Secondary | ICD-10-CM | POA: Diagnosis not present

## 2019-01-30 DIAGNOSIS — R109 Unspecified abdominal pain: Secondary | ICD-10-CM | POA: Diagnosis not present

## 2019-03-27 ENCOUNTER — Other Ambulatory Visit: Payer: Self-pay | Admitting: Family Medicine

## 2019-03-27 DIAGNOSIS — Z23 Encounter for immunization: Secondary | ICD-10-CM | POA: Diagnosis not present

## 2019-03-27 DIAGNOSIS — E782 Mixed hyperlipidemia: Secondary | ICD-10-CM | POA: Diagnosis not present

## 2019-03-27 DIAGNOSIS — R413 Other amnesia: Secondary | ICD-10-CM | POA: Diagnosis not present

## 2019-03-27 DIAGNOSIS — Z0000 Encounter for general adult medical examination without abnormal findings: Secondary | ICD-10-CM | POA: Diagnosis not present

## 2019-03-27 DIAGNOSIS — E039 Hypothyroidism, unspecified: Secondary | ICD-10-CM | POA: Diagnosis not present

## 2019-03-27 DIAGNOSIS — Z1231 Encounter for screening mammogram for malignant neoplasm of breast: Secondary | ICD-10-CM

## 2019-03-27 DIAGNOSIS — E559 Vitamin D deficiency, unspecified: Secondary | ICD-10-CM | POA: Diagnosis not present

## 2019-03-27 DIAGNOSIS — M19049 Primary osteoarthritis, unspecified hand: Secondary | ICD-10-CM | POA: Diagnosis not present

## 2019-03-27 DIAGNOSIS — N951 Menopausal and female climacteric states: Secondary | ICD-10-CM | POA: Diagnosis not present

## 2019-04-01 DIAGNOSIS — M47817 Spondylosis without myelopathy or radiculopathy, lumbosacral region: Secondary | ICD-10-CM | POA: Diagnosis not present

## 2019-04-01 DIAGNOSIS — G894 Chronic pain syndrome: Secondary | ICD-10-CM | POA: Diagnosis not present

## 2019-04-01 DIAGNOSIS — M961 Postlaminectomy syndrome, not elsewhere classified: Secondary | ICD-10-CM | POA: Diagnosis not present

## 2019-04-01 DIAGNOSIS — M47812 Spondylosis without myelopathy or radiculopathy, cervical region: Secondary | ICD-10-CM | POA: Diagnosis not present

## 2019-04-07 ENCOUNTER — Other Ambulatory Visit: Payer: Self-pay | Admitting: Family Medicine

## 2019-04-07 DIAGNOSIS — M8580 Other specified disorders of bone density and structure, unspecified site: Secondary | ICD-10-CM

## 2019-05-12 ENCOUNTER — Other Ambulatory Visit: Payer: Self-pay

## 2019-05-12 ENCOUNTER — Ambulatory Visit
Admission: RE | Admit: 2019-05-12 | Discharge: 2019-05-12 | Disposition: A | Discharge status: Home / Self Care | Payer: BC Managed Care – PPO | Source: Ambulatory Visit | Attending: Family Medicine | Admitting: Family Medicine

## 2019-05-12 DIAGNOSIS — Z1231 Encounter for screening mammogram for malignant neoplasm of breast: Secondary | ICD-10-CM | POA: Diagnosis not present

## 2019-05-15 DIAGNOSIS — J988 Other specified respiratory disorders: Secondary | ICD-10-CM | POA: Diagnosis not present

## 2019-05-15 DIAGNOSIS — Z03818 Encounter for observation for suspected exposure to other biological agents ruled out: Secondary | ICD-10-CM | POA: Diagnosis not present

## 2019-05-27 DIAGNOSIS — M47812 Spondylosis without myelopathy or radiculopathy, cervical region: Secondary | ICD-10-CM | POA: Diagnosis not present

## 2019-05-27 DIAGNOSIS — M47817 Spondylosis without myelopathy or radiculopathy, lumbosacral region: Secondary | ICD-10-CM | POA: Diagnosis not present

## 2019-05-27 DIAGNOSIS — M961 Postlaminectomy syndrome, not elsewhere classified: Secondary | ICD-10-CM | POA: Diagnosis not present

## 2019-05-27 DIAGNOSIS — G894 Chronic pain syndrome: Secondary | ICD-10-CM | POA: Diagnosis not present

## 2019-07-22 DIAGNOSIS — M47817 Spondylosis without myelopathy or radiculopathy, lumbosacral region: Secondary | ICD-10-CM | POA: Diagnosis not present

## 2019-07-22 DIAGNOSIS — M47812 Spondylosis without myelopathy or radiculopathy, cervical region: Secondary | ICD-10-CM | POA: Diagnosis not present

## 2019-07-22 DIAGNOSIS — G894 Chronic pain syndrome: Secondary | ICD-10-CM | POA: Diagnosis not present

## 2019-07-22 DIAGNOSIS — M961 Postlaminectomy syndrome, not elsewhere classified: Secondary | ICD-10-CM | POA: Diagnosis not present

## 2019-07-24 DIAGNOSIS — M654 Radial styloid tenosynovitis [de Quervain]: Secondary | ICD-10-CM | POA: Diagnosis not present

## 2019-09-22 DIAGNOSIS — G894 Chronic pain syndrome: Secondary | ICD-10-CM | POA: Diagnosis not present

## 2019-09-22 DIAGNOSIS — M47817 Spondylosis without myelopathy or radiculopathy, lumbosacral region: Secondary | ICD-10-CM | POA: Diagnosis not present

## 2019-09-22 DIAGNOSIS — M47812 Spondylosis without myelopathy or radiculopathy, cervical region: Secondary | ICD-10-CM | POA: Diagnosis not present

## 2019-09-22 DIAGNOSIS — M961 Postlaminectomy syndrome, not elsewhere classified: Secondary | ICD-10-CM | POA: Diagnosis not present

## 2019-09-23 DIAGNOSIS — M79671 Pain in right foot: Secondary | ICD-10-CM | POA: Diagnosis not present

## 2019-10-14 DIAGNOSIS — J45909 Unspecified asthma, uncomplicated: Secondary | ICD-10-CM | POA: Diagnosis not present

## 2019-10-14 DIAGNOSIS — J988 Other specified respiratory disorders: Secondary | ICD-10-CM | POA: Diagnosis not present

## 2019-11-25 DIAGNOSIS — M47812 Spondylosis without myelopathy or radiculopathy, cervical region: Secondary | ICD-10-CM | POA: Diagnosis not present

## 2019-11-25 DIAGNOSIS — M961 Postlaminectomy syndrome, not elsewhere classified: Secondary | ICD-10-CM | POA: Diagnosis not present

## 2019-11-25 DIAGNOSIS — Z79891 Long term (current) use of opiate analgesic: Secondary | ICD-10-CM | POA: Diagnosis not present

## 2019-11-25 DIAGNOSIS — M47817 Spondylosis without myelopathy or radiculopathy, lumbosacral region: Secondary | ICD-10-CM | POA: Diagnosis not present

## 2019-11-25 DIAGNOSIS — G894 Chronic pain syndrome: Secondary | ICD-10-CM | POA: Diagnosis not present

## 2019-11-26 DIAGNOSIS — M25561 Pain in right knee: Secondary | ICD-10-CM | POA: Diagnosis not present

## 2019-11-26 DIAGNOSIS — M79671 Pain in right foot: Secondary | ICD-10-CM | POA: Diagnosis not present

## 2019-12-17 DIAGNOSIS — T8484XA Pain due to internal orthopedic prosthetic devices, implants and grafts, initial encounter: Secondary | ICD-10-CM | POA: Diagnosis not present

## 2019-12-25 DIAGNOSIS — M2021 Hallux rigidus, right foot: Secondary | ICD-10-CM | POA: Diagnosis not present

## 2020-01-09 ENCOUNTER — Emergency Department (HOSPITAL_COMMUNITY)
Admission: EM | Admit: 2020-01-09 | Discharge: 2020-01-09 | Disposition: A | Discharge status: Home / Self Care | Payer: BC Managed Care – PPO | Attending: Emergency Medicine | Admitting: Emergency Medicine

## 2020-01-09 ENCOUNTER — Other Ambulatory Visit: Payer: Self-pay

## 2020-01-09 DIAGNOSIS — Z96653 Presence of artificial knee joint, bilateral: Secondary | ICD-10-CM | POA: Insufficient documentation

## 2020-01-09 DIAGNOSIS — R519 Headache, unspecified: Secondary | ICD-10-CM | POA: Insufficient documentation

## 2020-01-09 DIAGNOSIS — Z79899 Other long term (current) drug therapy: Secondary | ICD-10-CM | POA: Diagnosis not present

## 2020-01-09 DIAGNOSIS — Z7982 Long term (current) use of aspirin: Secondary | ICD-10-CM | POA: Insufficient documentation

## 2020-01-09 DIAGNOSIS — R5383 Other fatigue: Secondary | ICD-10-CM | POA: Insufficient documentation

## 2020-01-09 DIAGNOSIS — Z20822 Contact with and (suspected) exposure to covid-19: Secondary | ICD-10-CM

## 2020-01-09 DIAGNOSIS — E039 Hypothyroidism, unspecified: Secondary | ICD-10-CM | POA: Diagnosis not present

## 2020-01-09 LAB — RESPIRATORY PANEL BY RT PCR (FLU A&B, COVID)
Influenza A by PCR: NEGATIVE
Influenza B by PCR: NEGATIVE
SARS Coronavirus 2 by RT PCR: NEGATIVE

## 2020-01-09 NOTE — Discharge Instructions (Addendum)
Please maintain isolation precautions pending results of your COVID-19 testing.  If positive, you will receive a phone call from hospital staff about possibly receiving monoclonal antibody infusions.  Please follow-up with your primary care provider regarding today's encounter.  Your physical exam is entirely reassuring and do not feel as though laboratory work-up or imaging is warranted given brevity of illness.  Return to the ED or seek immediate medical attention should you experience any new or worsening symptoms.

## 2020-01-09 NOTE — ED Triage Notes (Signed)
Recently exposed to Covid c/o headache and fatigue. Pt has [facility] and [place] back in April.

## 2020-01-09 NOTE — ED Provider Notes (Signed)
[facility]   CSN: [id number] Arrival date & time: 01/09/20  1319     History Chief Complaint  Patient presents with  . Fatigue    [name] is a 54 y.o. female with no relevant past medical history presents the ED with complaints of headache and fatigue subsequent to known COVID-19 exposure.  Patient is fully immunized for COVID-19.  On my examination, patient states that she has only had a 1 day history of headache and fatigue.  She denies any cough, chest pain, shortness of breath, dyspnea on exertion, abdominal pain, diminished appetite, nausea vomiting, congestion, sore throat, changes in bowel habits, or other symptoms.  She states that she only wants to get tested because her mother was just diagnosed with COVID-19 and she has been spending time with her.  She did not realize that she would need to be roomed here in the ED.  No history of clots or clotting disorder.  She did have minor foot surgery approximately 4 weeks ago.  No unilateral extremity swelling or edema.  She is declining the need for any medication for her headache symptoms.   HPI     Past Medical History:  Diagnosis Date  . Anemia   . Anxiety   . Bronchitis    hx of   . Cervical pain (neck)   . Chronic pancreatitis (HCC)   . Closed fracture of base of fifth metatarsal bone of right foot at metaphyseal-diaphyseal junction   . Closed fracture of left lateral malleolus 12/29/2017   [facility] and was  prescribed bone growth stimulator device to use before she has knee surgery   . Constipation   . DDD (degenerative disc disease), cervical   . Depression   . GERD (gastroesophageal reflux disease)   . History of kidney stones   . History of pancreatitis 01-04-2011  . Hypothyroidism   . IBS (irritable bowel syndrome)   . Nausea DUE TO PAIN FROM KIDNEY STONE  . Pancreatic pseudocyst   . Pneumonia    hx of pneumonia   . PONV  (postoperative nausea and vomiting)   . Right flank pain   . Right knee pain   . Right ureteral stone   . Swelling of right knee joint   . Urinary tract infection    hx of     Patient Active Problem List   Diagnosis Date Noted  . 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 OA (osteoarthritis) of knee 12/13/2015  . Obese 09/01/2014  . 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 S/P revision of total knee 08/31/2014  . S/P right TKA 01/13/2014  . S/P knee replacement 01/13/2014  . Acute bronchitis 04/28/2013  . Chest pain 04/28/2013  . Chronic cough 02/02/2013  . Hypoxia in the setting of recent bronchitis and cough 06/07/2012  . Toxic encephalopathy 06/07/2012  . Leukocytosis 07/27/2011  . Metabolic encephalopathy 07/27/2011  . UTI (lower urinary tract infection) 07/27/2011  . Hx of pancreatitis 07/27/2011  . Anemia 07/27/2011  . Hypothyroid 07/27/2011  . Depression 07/27/2011  . GERD (gastroesophageal reflux disease) 07/27/2011  . Pancreatic pseudocyst 07/27/2011    Past Surgical History:  Procedure Laterality Date  . ABDOMINAL HYSTERECTOMY    . ANAL RECTAL MANOMETRY N/A 09/04/2016   Procedure: ANO RECTAL MANOMETRY;  Surgeon: Willis Modena, MD;  [facility]: [facility];  Service: Endoscopy;  Laterality: N/A;  . ANTERIOR LUMBAR FUSION  11-09-2010    L5 -  S1  . BACK SURGERY  2012   lower back surgery   . CERVICAL FUSION  1999   C4 - 7  . CERVICAL SPINE SURGERY  APRIL 2011   REMOVAL SCAR TISSUE  . colonscopy     . CYSTO/ RIGHT RETROGRADE PYELOGRAM/ RIGHT URETEROSCOPY AND STENT PLACEMENT  09-24-2009   HX RIGHT URETERAL CALCULUS  . CYSTOSCOPY WITH URETEROSCOPY  12/20/2011   Procedure: CYSTOSCOPY WITH URETEROSCOPY;  Surgeon: Milford Cageaniel Young Woodruff, MD;  [facility];  Service: Urology;  Laterality: Right;  . DIAGNOSTIC LAPAROSCOPY  1992   ENDOMETRIOSIS  . EUS  03/01/2011   Procedure: UPPER ENDOSCOPIC ULTRASOUND (EUS) LINEAR;  Surgeon: Freddy JakschWilliam M  Outlaw, MD;  [facility];  Service: Endoscopy;  Laterality: N/A;  . FINE NEEDLE ASPIRATION  03/01/2011   Procedure: FINE NEEDLE ASPIRATION (FNA) LINEAR;  Surgeon: Freddy JakschWilliam M Outlaw, MD;  [facility];  Service: Endoscopy;  Laterality: N/A;  . HERNIA REPAIR  AS CHILD  . JOINT REPLACEMENT    . KNEE ARTHROSCOPY  AUG 2013   RIGHT KNEE  . LAPAROSCOPIC ASSISTED VAGINAL HYSTERECTOMY  1997  . LAPAROSCOPIC CHOLECYSTECTOMY  01-09-2011  . ROBOTIC-ASSISTED RIGHT SALPINGO-OOPHORECTOMY W/ LYSIS ADHESIONS  12-21-2009   HX ENDOMETRIOSIS AND PELVIC PAIN  . TONSILLECTOMY  AS CHILD  . TOTAL KNEE ARTHROPLASTY Right 01/13/2014   Procedure: RIGHT TOTAL KNEE ARTHROPLASTY;  Surgeon: Shelda PalMatthew D Olin, MD;  [facility];  Service: Orthopedics;  Laterality: Right;  . TOTAL KNEE ARTHROPLASTY Left 12/13/2015   Procedure: LEFT TOTAL KNEE ARTHROPLASTY;  Surgeon: Ollen GrossFrank Aluisio, MD;  [facility];  Service: Orthopedics;  Laterality: Left;  . TOTAL KNEE REVISION Right 08/31/2014   Procedure: RIGHT TOTAL KNEE ARTHROPLASTY RESECTION;  Surgeon: Durene RomansMatthew Olin, MD;  [facility];  Service: Orthopedics;  Laterality: Right;  . TOTAL KNEE REVISION Right 01/09/2018   Procedure: RIGHT TOTAL KNEE REVISION;  Surgeon: Ollen GrossAluisio, Frank, MD;  [facility];  Service: Orthopedics;  Laterality: Right;  Adductor Block  . URETEROSCOPIC STONE EXTRACTIONS     X3     OB History   No obstetric history on file.     Family History  Problem Relation Age of Onset  . Constipation Mother   . Cancer Father   . Fibromyalgia Sister     Social History   Tobacco Use  . Smoking status: Never Smoker  . Smokeless tobacco: Never Used  Substance Use Topics  . Alcohol use: No  . Drug use: No    Home Medications Prior to Admission medications   Medication Sig Start Date End Date Taking? Authorizing Provider  albuterol (PROVENTIL HFA;VENTOLIN HFA) 108 (90 BASE) MCG/ACT inhaler Inhale 2 puffs into the lungs every 4  (four) hours as needed for wheezing or shortness of breath.     [provider]  ALPRAZolam Prudy Feeler(XANAX) 1 MG tablet Take 1 mg by mouth 3 (three) times daily as needed for anxiety.     [provider]  aspirin EC 325 MG EC tablet Take 1 tablet (325 mg total) by mouth 2 (two) times daily. Take one tablet (325 mg) Aspirin two times a day for three weeks following surgery. Then take one baby Aspirin (81 mg) once a day for three weeks. Then discontinue aspirin. 01/10/18   Edmisten, Kristie L, PA  Cholecalciferol (VITAMIN D3) 2000 units TABS Take 2,000 Units by mouth daily.    [provider]  diclofenac sodium (VOLTAREN) 1 % GEL Apply 1 application topically 4 (  four) times daily as needed (for pain).     [provider]  docusate sodium (COLACE) 100 MG capsule Take 1 capsule (100 mg total) by mouth 2 (two) times daily. Patient taking differently: Take 200 mg by mouth at bedtime as needed for moderate constipation.  09/01/14   Lanney Gins, PA-C  DULoxetine (CYMBALTA) 60 MG capsule Take 60 mg by mouth 2 (two) times daily.    [provider]  hydrochlorothiazide (HYDRODIURIL) 25 MG tablet Take 25 mg by mouth daily.     [provider]  hydrocortisone-pramoxine [facility]) rectal foam Place 1 applicator rectally 2 (two) times daily as needed for hemorrhoids.    [provider]  levothyroxine (SYNTHROID, LEVOTHROID) 125 MCG tablet Take 125 mcg by mouth daily before breakfast.    [provider]  lipase/protease/amylase (CREON) 36000 UNITS CPEP capsule Take 36,000 Units by mouth 3 (three) times daily with meals.     [provider]  loratadine (CLARITIN) 10 MG tablet Take 10 mg by mouth daily as needed for allergies.    [provider]  Meth-Hyo-M Bl-Na Phos-Ph Sal (URIBEL) 118 MG CAPS Take 118 mg by mouth 4 (four) times daily as needed (for UTI).     [provider]  methocarbamol (ROBAXIN) 500 MG tablet Take  1 tablet (500 mg total) by mouth every 6 (six) hours as needed for muscle spasms. 01/10/18   Edmisten, Lyn Hollingshead, PA  Misc Natural Products (TART CHERRY ADVANCED PO) Take 1 capsule by mouth 3 (three) times daily.    [provider]  nitrofurantoin, macrocrystal-monohydrate, (MACROBID) 100 MG capsule Take 100 mg by mouth daily.    [provider]  NUCYNTA ER 150 MG TB12 Take 150 mg by mouth every 12 (twelve) hours. 12/10/17   [provider]  ondansetron (ZOFRAN ODT) 4 MG disintegrating tablet Take 1 tablet (4 mg total) by mouth every 8 (eight) hours as needed for nausea or vomiting. Patient not taking: Reported on 12/27/2017 06/25/16   Mesner, Barbara Cower, MD  oxyCODONE (OXY IR/ROXICODONE) 5 MG immediate release tablet Take 1-2 tablets (5-10 mg total) by mouth every 6 (six) hours as needed for moderate pain (pain score 4-6). 01/10/18   Edmisten, Lyn Hollingshead, PA  polycarbophil (FIBERCON) 625 MG tablet Take 625 mg by mouth 3 (three) times daily.     [provider]  polyethylene glycol (MIRALAX / GLYCOLAX) packet Take 17 g by mouth daily as needed for moderate constipation.     [provider]  pregabalin (LYRICA) 300 MG capsule Take 300 mg by mouth 2 (two) times daily.    [provider]  promethazine (PHENERGAN) 25 MG tablet Take 1 tablet (25 mg total) by mouth 3 (three) times daily as needed for nausea. Patient taking differently: Take 25 mg by mouth every 8 (eight) hours as needed for nausea or vomiting.  09/01/14   Lanney Gins, PA-C  sodium chloride (OCEAN) 0.65 % SOLN nasal spray Place 1 spray into both nostrils daily as needed for congestion.     [provider]    Allergies    Deltasone [prednisone], Methylprednisolone sodium succ, Solu-medrol [methylprednisolone], Fentanyl, Septra ds [sulfamethoxazole-trimethoprim], Dilaudid [hydromorphone hcl], and Zolpidem tartrate  Review of Systems   Review of Systems  All other systems reviewed  and are negative.   Physical Exam Updated Vital Signs BP (!) 110/94   Pulse 75   Temp 98.8 F (37.1 C) (Oral)   Resp 15   SpO2 93%   Physical  Exam Vitals and nursing note reviewed. Exam conducted with a chaperone present.  Constitutional:      General: She is not in acute distress.    Appearance: Normal appearance. She is not ill-appearing.  HENT:     Head: Normocephalic and atraumatic.  Eyes:     General: No scleral icterus.    Conjunctiva/sclera: Conjunctivae normal.  Cardiovascular:     Rate and Rhythm: Normal rate and regular rhythm.     Pulses: Normal pulses.     Heart sounds: Normal heart sounds.  Pulmonary:     Effort: Pulmonary effort is normal. No respiratory distress.     Breath sounds: Normal breath sounds.  Abdominal:     General: Abdomen is flat. There is no distension.     Palpations: Abdomen is soft.     Tenderness: There is no abdominal tenderness. There is no guarding.  Musculoskeletal:     Right lower leg: No edema.     Left lower leg: No edema.  Skin:    General: Skin is dry.     Capillary Refill: Capillary refill takes less than 2 seconds.  Neurological:     Mental Status: She is alert and oriented to person, place, and time.     GCS: GCS eye subscore is 4. GCS verbal subscore is 5. GCS motor subscore is 6.  Psychiatric:        Mood and Affect: Mood normal.        Behavior: Behavior normal.        Thought Content: Thought content normal.     ED Results / Procedures / Treatments   Labs (all labs ordered are listed, but only abnormal results are displayed) Labs Reviewed  RESPIRATORY PANEL BY RT PCR (FLU A&B, COVID)    EKG None  Radiology No results found.  Procedures Procedures (including critical care time)  Medications Ordered in ED Medications - No data to display  ED Course  I have reviewed the triage vital signs and the nursing notes.  Pertinent labs & imaging results that were available during my care of the patient were  reviewed by me and considered in my medical decision making (see chart for details).    MDM Rules/Calculators/A&P                          Patient has been to the ED with complaints of mild headache and fatigue in the context of close COVID-19 exposure.  She is simply requesting testing here in the ED.  No evidence of meningismus.  She denies any fevers or chills at home.  She is well-appearing and in no acute distress.  Denies any shortness of breath or chest pain.  She is eating and drinking without difficulty.  We will obtain COVID-19 respiratory panel testing.  Do not need to keep her here in the ED to await results.  She is clinically well and exhibiting no increased work of breathing.  Stable for discharge home.  She does not require work note at this time.  She will follow up with her primary care provider regarding today's encounter and for ongoing evaluation and management.  Discussed this plan with patient and she agrees.  Strict ED return precautions discussed.  Patient voices understanding.  [name] was evaluated in Emergency Department on 01/09/2020 for the symptoms described in the history of present illness. She was evaluated in the context of the global COVID-19 pandemic, which necessitated consideration that the  patient might be at risk for infection with the SARS-CoV-2 virus that causes COVID-19. Institutional protocols and algorithms that pertain to the evaluation of patients at risk for COVID-19 are in a state of rapid change based on information released by regulatory bodies including the CDC and federal and state organizations. These policies and algorithms were followed during the patient's care in the ED.   Final Clinical Impression(s) / ED Diagnoses Final diagnoses:  Close exposure to COVID-19 virus    Rx / DC Orders ED Discharge Orders    None       Lorelee New, PA-C 01/09/20 1452    Virgina Norfolk, DO 01/09/20 1535

## 2020-01-26 DIAGNOSIS — M47817 Spondylosis without myelopathy or radiculopathy, lumbosacral region: Secondary | ICD-10-CM | POA: Diagnosis not present

## 2020-01-26 DIAGNOSIS — M47812 Spondylosis without myelopathy or radiculopathy, cervical region: Secondary | ICD-10-CM | POA: Diagnosis not present

## 2020-01-26 DIAGNOSIS — M25531 Pain in right wrist: Secondary | ICD-10-CM | POA: Diagnosis not present

## 2020-01-26 DIAGNOSIS — M961 Postlaminectomy syndrome, not elsewhere classified: Secondary | ICD-10-CM | POA: Diagnosis not present

## 2020-01-26 DIAGNOSIS — G894 Chronic pain syndrome: Secondary | ICD-10-CM | POA: Diagnosis not present

## 2020-01-27 DIAGNOSIS — M189 Osteoarthritis of first carpometacarpal joint, unspecified: Secondary | ICD-10-CM | POA: Diagnosis not present

## 2020-01-27 DIAGNOSIS — M654 Radial styloid tenosynovitis [de Quervain]: Secondary | ICD-10-CM | POA: Diagnosis not present

## 2020-02-27 DIAGNOSIS — M654 Radial styloid tenosynovitis [de Quervain]: Secondary | ICD-10-CM | POA: Diagnosis not present

## 2020-02-27 DIAGNOSIS — G5601 Carpal tunnel syndrome, right upper limb: Secondary | ICD-10-CM | POA: Diagnosis not present

## 2020-02-27 DIAGNOSIS — M1811 Unilateral primary osteoarthritis of first carpometacarpal joint, right hand: Secondary | ICD-10-CM | POA: Diagnosis not present

## 2020-03-18 DIAGNOSIS — J22 Unspecified acute lower respiratory infection: Secondary | ICD-10-CM | POA: Diagnosis not present

## 2020-03-19 DIAGNOSIS — Z03818 Encounter for observation for suspected exposure to other biological agents ruled out: Secondary | ICD-10-CM | POA: Diagnosis not present

## 2020-03-19 DIAGNOSIS — J22 Unspecified acute lower respiratory infection: Secondary | ICD-10-CM | POA: Diagnosis not present

## 2020-03-22 DIAGNOSIS — M47817 Spondylosis without myelopathy or radiculopathy, lumbosacral region: Secondary | ICD-10-CM | POA: Diagnosis not present

## 2020-03-22 DIAGNOSIS — M47812 Spondylosis without myelopathy or radiculopathy, cervical region: Secondary | ICD-10-CM | POA: Diagnosis not present

## 2020-03-22 DIAGNOSIS — G894 Chronic pain syndrome: Secondary | ICD-10-CM | POA: Diagnosis not present

## 2020-03-22 DIAGNOSIS — M961 Postlaminectomy syndrome, not elsewhere classified: Secondary | ICD-10-CM | POA: Diagnosis not present

## 2020-04-01 DIAGNOSIS — M1811 Unilateral primary osteoarthritis of first carpometacarpal joint, right hand: Secondary | ICD-10-CM | POA: Diagnosis not present

## 2020-04-01 DIAGNOSIS — M654 Radial styloid tenosynovitis [de Quervain]: Secondary | ICD-10-CM | POA: Diagnosis not present

## 2020-04-01 DIAGNOSIS — G5601 Carpal tunnel syndrome, right upper limb: Secondary | ICD-10-CM | POA: Diagnosis not present

## 2020-04-01 DIAGNOSIS — G8918 Other acute postprocedural pain: Secondary | ICD-10-CM | POA: Diagnosis not present

## 2020-04-14 DIAGNOSIS — M1811 Unilateral primary osteoarthritis of first carpometacarpal joint, right hand: Secondary | ICD-10-CM | POA: Diagnosis not present

## 2020-04-14 DIAGNOSIS — M25531 Pain in right wrist: Secondary | ICD-10-CM | POA: Diagnosis not present

## 2020-04-16 DIAGNOSIS — N3010 Interstitial cystitis (chronic) without hematuria: Secondary | ICD-10-CM | POA: Diagnosis not present

## 2020-04-16 DIAGNOSIS — N3592 Unspecified urethral stricture, female: Secondary | ICD-10-CM | POA: Diagnosis not present

## 2020-04-16 DIAGNOSIS — Z87442 Personal history of urinary calculi: Secondary | ICD-10-CM | POA: Diagnosis not present

## 2020-04-16 DIAGNOSIS — R109 Unspecified abdominal pain: Secondary | ICD-10-CM | POA: Diagnosis not present

## 2020-04-26 DIAGNOSIS — M25531 Pain in right wrist: Secondary | ICD-10-CM | POA: Diagnosis not present

## 2020-05-05 DIAGNOSIS — M25531 Pain in right wrist: Secondary | ICD-10-CM | POA: Diagnosis not present

## 2020-05-12 DIAGNOSIS — M25531 Pain in right wrist: Secondary | ICD-10-CM | POA: Diagnosis not present

## 2020-05-12 DIAGNOSIS — G5601 Carpal tunnel syndrome, right upper limb: Secondary | ICD-10-CM | POA: Diagnosis not present

## 2020-05-17 DIAGNOSIS — M961 Postlaminectomy syndrome, not elsewhere classified: Secondary | ICD-10-CM | POA: Diagnosis not present

## 2020-05-17 DIAGNOSIS — M47812 Spondylosis without myelopathy or radiculopathy, cervical region: Secondary | ICD-10-CM | POA: Diagnosis not present

## 2020-05-17 DIAGNOSIS — M47817 Spondylosis without myelopathy or radiculopathy, lumbosacral region: Secondary | ICD-10-CM | POA: Diagnosis not present

## 2020-05-17 DIAGNOSIS — G894 Chronic pain syndrome: Secondary | ICD-10-CM | POA: Diagnosis not present

## 2020-05-25 DIAGNOSIS — M25531 Pain in right wrist: Secondary | ICD-10-CM | POA: Diagnosis not present

## 2020-05-26 DIAGNOSIS — J329 Chronic sinusitis, unspecified: Secondary | ICD-10-CM | POA: Diagnosis not present

## 2020-05-26 DIAGNOSIS — R053 Chronic cough: Secondary | ICD-10-CM | POA: Diagnosis not present

## 2020-05-26 DIAGNOSIS — H6983 Other specified disorders of Eustachian tube, bilateral: Secondary | ICD-10-CM | POA: Diagnosis not present

## 2020-05-26 DIAGNOSIS — E039 Hypothyroidism, unspecified: Secondary | ICD-10-CM | POA: Diagnosis not present

## 2020-06-08 DIAGNOSIS — M25531 Pain in right wrist: Secondary | ICD-10-CM | POA: Diagnosis not present

## 2020-06-14 DIAGNOSIS — J329 Chronic sinusitis, unspecified: Secondary | ICD-10-CM | POA: Diagnosis not present

## 2020-06-14 DIAGNOSIS — H6983 Other specified disorders of Eustachian tube, bilateral: Secondary | ICD-10-CM | POA: Diagnosis not present

## 2020-06-15 ENCOUNTER — Other Ambulatory Visit: Payer: Self-pay | Admitting: Family Medicine

## 2020-06-15 DIAGNOSIS — J329 Chronic sinusitis, unspecified: Secondary | ICD-10-CM

## 2020-06-23 DIAGNOSIS — M25531 Pain in right wrist: Secondary | ICD-10-CM | POA: Diagnosis not present

## 2020-06-23 DIAGNOSIS — G5601 Carpal tunnel syndrome, right upper limb: Secondary | ICD-10-CM | POA: Diagnosis not present

## 2020-06-30 ENCOUNTER — Ambulatory Visit
Admission: RE | Admit: 2020-06-30 | Discharge: 2020-06-30 | Disposition: A | Discharge status: Home / Self Care | Payer: BC Managed Care – PPO | Source: Ambulatory Visit | Attending: Family Medicine | Admitting: Family Medicine

## 2020-06-30 ENCOUNTER — Encounter (INDEPENDENT_AMBULATORY_CARE_PROVIDER_SITE_OTHER): Payer: Self-pay

## 2020-06-30 DIAGNOSIS — J329 Chronic sinusitis, unspecified: Secondary | ICD-10-CM

## 2020-06-30 DIAGNOSIS — J0110 Acute frontal sinusitis, unspecified: Secondary | ICD-10-CM | POA: Diagnosis not present

## 2020-06-30 DIAGNOSIS — R42 Dizziness and giddiness: Secondary | ICD-10-CM | POA: Diagnosis not present

## 2020-06-30 DIAGNOSIS — J321 Chronic frontal sinusitis: Secondary | ICD-10-CM | POA: Diagnosis not present

## 2020-07-13 DIAGNOSIS — M961 Postlaminectomy syndrome, not elsewhere classified: Secondary | ICD-10-CM | POA: Diagnosis not present

## 2020-07-13 DIAGNOSIS — M47812 Spondylosis without myelopathy or radiculopathy, cervical region: Secondary | ICD-10-CM | POA: Diagnosis not present

## 2020-07-13 DIAGNOSIS — G894 Chronic pain syndrome: Secondary | ICD-10-CM | POA: Diagnosis not present

## 2020-07-13 DIAGNOSIS — M47817 Spondylosis without myelopathy or radiculopathy, lumbosacral region: Secondary | ICD-10-CM | POA: Diagnosis not present

## 2020-07-20 DIAGNOSIS — H903 Sensorineural hearing loss, bilateral: Secondary | ICD-10-CM | POA: Diagnosis not present

## 2020-07-20 DIAGNOSIS — J342 Deviated nasal septum: Secondary | ICD-10-CM | POA: Diagnosis not present

## 2020-07-20 DIAGNOSIS — R42 Dizziness and giddiness: Secondary | ICD-10-CM | POA: Diagnosis not present

## 2020-07-20 DIAGNOSIS — H9313 Tinnitus, bilateral: Secondary | ICD-10-CM | POA: Diagnosis not present

## 2020-09-07 DIAGNOSIS — M961 Postlaminectomy syndrome, not elsewhere classified: Secondary | ICD-10-CM | POA: Diagnosis not present

## 2020-09-07 DIAGNOSIS — M47817 Spondylosis without myelopathy or radiculopathy, lumbosacral region: Secondary | ICD-10-CM | POA: Diagnosis not present

## 2020-09-07 DIAGNOSIS — M47812 Spondylosis without myelopathy or radiculopathy, cervical region: Secondary | ICD-10-CM | POA: Diagnosis not present

## 2020-09-07 DIAGNOSIS — G894 Chronic pain syndrome: Secondary | ICD-10-CM | POA: Diagnosis not present

## 2020-09-10 DIAGNOSIS — E782 Mixed hyperlipidemia: Secondary | ICD-10-CM | POA: Diagnosis not present

## 2020-09-10 DIAGNOSIS — E039 Hypothyroidism, unspecified: Secondary | ICD-10-CM | POA: Diagnosis not present

## 2020-09-10 DIAGNOSIS — E559 Vitamin D deficiency, unspecified: Secondary | ICD-10-CM | POA: Diagnosis not present

## 2020-10-08 DIAGNOSIS — Z6835 Body mass index (BMI) 35.0-35.9, adult: Secondary | ICD-10-CM | POA: Diagnosis not present

## 2020-10-08 DIAGNOSIS — G4489 Other headache syndrome: Secondary | ICD-10-CM | POA: Diagnosis not present

## 2020-10-28 DIAGNOSIS — Z23 Encounter for immunization: Secondary | ICD-10-CM | POA: Diagnosis not present

## 2020-10-28 DIAGNOSIS — Z0000 Encounter for general adult medical examination without abnormal findings: Secondary | ICD-10-CM | POA: Diagnosis not present

## 2020-11-01 ENCOUNTER — Other Ambulatory Visit: Payer: Self-pay | Admitting: Family Medicine

## 2020-11-01 DIAGNOSIS — E2839 Other primary ovarian failure: Secondary | ICD-10-CM

## 2020-11-02 DIAGNOSIS — M961 Postlaminectomy syndrome, not elsewhere classified: Secondary | ICD-10-CM | POA: Diagnosis not present

## 2020-11-02 DIAGNOSIS — G894 Chronic pain syndrome: Secondary | ICD-10-CM | POA: Diagnosis not present

## 2020-11-02 DIAGNOSIS — M47817 Spondylosis without myelopathy or radiculopathy, lumbosacral region: Secondary | ICD-10-CM | POA: Diagnosis not present

## 2020-11-02 DIAGNOSIS — M47812 Spondylosis without myelopathy or radiculopathy, cervical region: Secondary | ICD-10-CM | POA: Diagnosis not present

## 2020-11-02 DIAGNOSIS — Z79891 Long term (current) use of opiate analgesic: Secondary | ICD-10-CM | POA: Diagnosis not present

## 2020-11-03 DIAGNOSIS — M47816 Spondylosis without myelopathy or radiculopathy, lumbar region: Secondary | ICD-10-CM | POA: Diagnosis not present

## 2020-11-30 ENCOUNTER — Other Ambulatory Visit: Payer: Self-pay | Admitting: Family Medicine

## 2020-11-30 DIAGNOSIS — Z1231 Encounter for screening mammogram for malignant neoplasm of breast: Secondary | ICD-10-CM

## 2020-12-15 DIAGNOSIS — U071 COVID-19: Secondary | ICD-10-CM | POA: Diagnosis not present

## 2020-12-23 DIAGNOSIS — J208 Acute bronchitis due to other specified organisms: Secondary | ICD-10-CM | POA: Diagnosis not present

## 2020-12-23 DIAGNOSIS — U071 COVID-19: Secondary | ICD-10-CM | POA: Diagnosis not present

## 2020-12-23 DIAGNOSIS — J988 Other specified respiratory disorders: Secondary | ICD-10-CM | POA: Diagnosis not present

## 2020-12-24 ENCOUNTER — Other Ambulatory Visit: Payer: Self-pay | Admitting: Family Medicine

## 2020-12-24 ENCOUNTER — Ambulatory Visit
Admission: RE | Admit: 2020-12-24 | Discharge: 2020-12-24 | Disposition: A | Discharge status: Home / Self Care | Payer: BC Managed Care – PPO | Source: Ambulatory Visit | Attending: Family Medicine | Admitting: Family Medicine

## 2020-12-24 ENCOUNTER — Other Ambulatory Visit: Payer: Self-pay

## 2020-12-24 DIAGNOSIS — J208 Acute bronchitis due to other specified organisms: Secondary | ICD-10-CM

## 2020-12-24 DIAGNOSIS — U071 COVID-19: Secondary | ICD-10-CM

## 2020-12-24 DIAGNOSIS — J988 Other specified respiratory disorders: Secondary | ICD-10-CM

## 2020-12-24 DIAGNOSIS — R059 Cough, unspecified: Secondary | ICD-10-CM | POA: Diagnosis not present

## 2021-01-03 DIAGNOSIS — M47817 Spondylosis without myelopathy or radiculopathy, lumbosacral region: Secondary | ICD-10-CM | POA: Diagnosis not present

## 2021-01-03 DIAGNOSIS — M47812 Spondylosis without myelopathy or radiculopathy, cervical region: Secondary | ICD-10-CM | POA: Diagnosis not present

## 2021-01-03 DIAGNOSIS — G894 Chronic pain syndrome: Secondary | ICD-10-CM | POA: Diagnosis not present

## 2021-01-03 DIAGNOSIS — M961 Postlaminectomy syndrome, not elsewhere classified: Secondary | ICD-10-CM | POA: Diagnosis not present

## 2021-01-05 ENCOUNTER — Ambulatory Visit: Payer: BC Managed Care – PPO

## 2021-01-10 DIAGNOSIS — J069 Acute upper respiratory infection, unspecified: Secondary | ICD-10-CM | POA: Diagnosis not present

## 2021-01-10 DIAGNOSIS — J209 Acute bronchitis, unspecified: Secondary | ICD-10-CM | POA: Diagnosis not present

## 2021-02-09 ENCOUNTER — Ambulatory Visit
Admission: RE | Admit: 2021-02-09 | Discharge: 2021-02-09 | Disposition: A | Discharge status: Home / Self Care | Payer: BC Managed Care – PPO | Source: Ambulatory Visit | Attending: Family Medicine | Admitting: Family Medicine

## 2021-02-09 ENCOUNTER — Other Ambulatory Visit: Payer: Self-pay | Admitting: Family Medicine

## 2021-02-09 DIAGNOSIS — N644 Mastodynia: Secondary | ICD-10-CM

## 2021-02-09 DIAGNOSIS — Z1231 Encounter for screening mammogram for malignant neoplasm of breast: Secondary | ICD-10-CM

## 2021-02-09 DIAGNOSIS — N6320 Unspecified lump in the left breast, unspecified quadrant: Secondary | ICD-10-CM

## 2021-02-11 ENCOUNTER — Encounter: Payer: Self-pay | Admitting: Internal Medicine

## 2021-02-11 ENCOUNTER — Ambulatory Visit: Payer: BC Managed Care – PPO | Admitting: Internal Medicine

## 2021-02-11 ENCOUNTER — Other Ambulatory Visit: Payer: Self-pay

## 2021-02-11 ENCOUNTER — Institutional Professional Consult (permissible substitution): Payer: BC Managed Care – PPO | Admitting: Internal Medicine

## 2021-02-11 VITALS — BP 130/70 | HR 86 | Temp 98.4°F | Ht 64.5 in | Wt 221.0 lb

## 2021-02-11 DIAGNOSIS — R0602 Shortness of breath: Secondary | ICD-10-CM

## 2021-02-11 DIAGNOSIS — K219 Gastro-esophageal reflux disease without esophagitis: Secondary | ICD-10-CM | POA: Diagnosis not present

## 2021-02-11 DIAGNOSIS — U071 COVID-19: Secondary | ICD-10-CM

## 2021-02-11 MED ORDER — FLUTICASONE FUROATE-VILANTEROL 200-25 MCG/ACT IN AEPB: 1.0000 | INHALATION_SPRAY | Freq: Every day | RESPIRATORY_TRACT | 5 refills | Status: DC

## 2021-02-11 NOTE — Patient Instructions (Addendum)
Please schedule follow up scheduled with myself in 2 months.  If my schedule is not open yet, we will contact you with a reminder closer to that time. Please call [phone] if you haven't heard from [place] a month before.   Before your next visit I would like you to have: Full set of PFTs - 1 hour, appt with me after.    Start Breo inhaler 1 puff once a day. Gargle after use.   Continue albuterol as needed.

## 2021-02-11 NOTE — Progress Notes (Signed)
[name]    [id number]    Sep 29, 1965  Primary Care Physician:McNeill, Toniann Fail, MD  Referring Physician: Gweneth Dimitri, MD [address],  [place] [address] Reason for Consultation: shortness of breath Date of Consultation: 02/11/2021  Chief complaint:   Chief Complaint  Patient presents with   Consult    Pt was diagnosed with Covid 12/15/20 and states she still has problems of SOB and an occ cough.     HPI: [name] is a 55 y.o. woman who presents for new patient evaluation of dyspnea. Notes worsening shortness of breath carrying her dog up the stairs. Also worse with breathing.   Had covid December 15 2020. She was treated with anti-viral treatment. She was also on an antibiotic for three weeks.   She is overall better but not where she'd like to be.  She has been prescribed albuterol inhaler which helps her breathing but does make her jittery. Using 3-4 times/day.   She has had bronchitis previously but it has been a few years. No childhood respiratory disease.   She saw Dr. Marchelle Gearing 5 years ago for cough.   Her cough gets worse with cold weather.   She notes worsening nasal congestion as well. Using astelin and dymista. This seems to be helping.  She is feeling a little anxious and nervous because she is having brain fog and got lost on her way to the appointment and had to reschedule.    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  Smoking history: never smoker, passive smoke exposure in childhood  Social History   Occupational History   Occupation: Unemployed  Tobacco Use   Smoking status: Never   Smokeless tobacco: Never  Substance and Sexual Activity   Alcohol use: No   Drug use: No   Sexual activity: Never    Relevant family history:  Family History  Problem Relation Age of Onset   Constipation Mother     Cancer Father    Fibromyalgia Sister     Past Medical History:  Diagnosis Date   Anemia    Anxiety    Bronchitis    hx of    Cervical pain (neck)    Chronic pancreatitis (HCC)    Closed fracture of base of fifth metatarsal bone of right foot at metaphyseal-diaphyseal junction    Closed fracture of left lateral malleolus 12/29/2017   [facility] at [facility] and was  prescribed bone growth stimulator device to use before she has knee surgery    Constipation    DDD (degenerative disc disease), cervical    Depression    GERD (gastroesophageal reflux disease)    History of kidney stones    History of pancreatitis 01-04-2011   Hypothyroidism    IBS (irritable bowel syndrome)    Nausea DUE TO PAIN FROM KIDNEY STONE   Pancreatic pseudocyst    Pneumonia    hx of pneumonia    PONV (postoperative nausea and vomiting)    Right flank pain    Right knee pain    Right ureteral stone    Swelling of right knee joint    Urinary tract infection    hx of     Past Surgical History:  Procedure Laterality Date   ABDOMINAL HYSTERECTOMY     ANAL RECTAL MANOMETRY N/A 09/04/2016   Procedure:  ANO RECTAL MANOMETRY;  Surgeon: Willis Modena, MD;  [facility];  Service: Endoscopy;  Laterality: N/A;   ANTERIOR LUMBAR FUSION  11-09-2010    L5 - S1   BACK SURGERY  2012   lower back surgery    CERVICAL FUSION  1999   C4 - 7   CERVICAL SPINE SURGERY  APRIL 2011   REMOVAL SCAR TISSUE   colonscopy      CYSTO/ RIGHT RETROGRADE PYELOGRAM/ RIGHT URETEROSCOPY AND STENT PLACEMENT  09-24-2009   HX RIGHT URETERAL CALCULUS   CYSTOSCOPY WITH URETEROSCOPY  12/20/2011   Procedure: CYSTOSCOPY WITH URETEROSCOPY;  Surgeon: Milford Cage, MD;  Location[facility];  Service: Urology;  Laterality: Right;   DIAGNOSTIC LAPAROSCOPY  1992   ENDOMETRIOSIS   EUS  03/01/2011   Procedure: UPPER ENDOSCOPIC ULTRASOUND (EUS) LINEAR;  Surgeon: Freddy Jaksch, MD;  [facility];  Service: Endoscopy;  Laterality: N/A;   FINE NEEDLE ASPIRATION  03/01/2011   Procedure: FINE NEEDLE ASPIRATION (FNA) LINEAR;  Surgeon: Freddy Jaksch, MD;  [facility];  Service: Endoscopy;  Laterality: N/A;   HERNIA REPAIR  AS CHILD   JOINT REPLACEMENT     KNEE ARTHROSCOPY  AUG 2013   RIGHT KNEE   LAPAROSCOPIC ASSISTED VAGINAL HYSTERECTOMY  1997   LAPAROSCOPIC CHOLECYSTECTOMY  01-09-2011   ROBOTIC-ASSISTED RIGHT SALPINGO-OOPHORECTOMY W/ LYSIS ADHESIONS  12-21-2009   HX ENDOMETRIOSIS AND PELVIC PAIN   TONSILLECTOMY  AS CHILD   TOTAL KNEE ARTHROPLASTY Right 01/13/2014   Procedure: RIGHT TOTAL KNEE ARTHROPLASTY;  Surgeon: Shelda Pal, MD;  [facility];  Service: Orthopedics;  Laterality: Right;   TOTAL KNEE ARTHROPLASTY Left 12/13/2015   Procedure: LEFT TOTAL KNEE ARTHROPLASTY;  Surgeon: Ollen Gross, MD;  [facility];  Service: Orthopedics;  Laterality: Left;   TOTAL KNEE REVISION Right 08/31/2014   Procedure: RIGHT TOTAL KNEE ARTHROPLASTY RESECTION;  Surgeon: Durene Romans, MD;  [facility];  Service: Orthopedics;  Laterality: Right;   TOTAL KNEE REVISION Right 01/09/2018   Procedure: RIGHT TOTAL KNEE REVISION;  Surgeon: Ollen Gross, MD;  [facility];  Service: Orthopedics;  Laterality: Right;  Adductor Block   URETEROSCOPIC STONE EXTRACTIONS     X3     Physical Exam: Blood pressure 130/70, pulse 86, temperature 98.4 F (36.9 C), temperature source Oral, height 5' 4.5" (1.638 m), weight 221 lb (100.2 kg), SpO2 95 %. Gen:      No acute distress ENT:  +cobblestoning oropharynx, no nasal polyps, mucus membranes moist Lungs:    No increased respiratory effort, symmetric chest wall excursion, clear to auscultation bilaterally, no wheezes or crackles CV:         Regular rate and rhythm; no murmurs, rubs, or gallops.  No pedal edema Abd:      + bowel sounds; soft, non-tender; no distension MSK: no acute synovitis of DIP or PIP joints, no  mechanics hands.  Skin:      Warm and dry; no rashes Neuro: normal speech, no focal facial asymmetry Psych: alert and oriented x3, anxious   Data Reviewed/Medical Decision Making:  Independent interpretation of tests: Imaging:  Review of patient's Chest xray October 2022 images revealed no acute cardiopulmonary process. The patient's images have been independently reviewed by me.    PFTs:  No flowsheet data found.  Labs:  Lab Results  Component Value Date   WBC 9.1 01/11/2018   HGB 10.7 (L) 01/11/2018   HCT 34.9 (L) 01/11/2018  MCV 84.3 01/11/2018   PLT 204 01/11/2018   Lab Results  Component Value Date   NA 135 01/11/2018   K 3.9 01/11/2018   CL 98 01/11/2018   CO2 29 01/11/2018     Immunization status:  Immunization History  Administered Date(s) Administered   Influenza Inj Mdck Quad Pf 04/05/2017   Influenza Split 04/05/2017, 01/13/2020   Influenza,inj,Quad PF,6+ Mos 03/27/2019   Influenza,inj,Quad PF,6-35 Mos 12/25/2017   Influenza,inj,quad, With Preservative 11/17/2015   Janssen (J&J) SARS-COV-2 Vaccination 06/12/2019, 01/13/2020   Tdap 10/14/2010, 10/28/2020     I reviewed prior external note(s) from PCP  I reviewed the result(s) of the labs and imaging as noted above.   I have ordered PFT  Assessment:  Shortness of breath Covid 19 infection GERD Chronic rhinitis  Plan/Recommendations: Possible reactive airways after covid. Plan to start Breo 1 puff once a day, continue prn albuterol Will obtain PFTs.   Continue flonase and astelin for rhinitis. Sleep with a humidifier.   Continue prilosec for GERD  We discussed disease management and progression at length today.    Return to Care: Return in about 6 weeks (around 03/25/2021).  Durel Salts, MD Pulmonary and Critical Care Medicine [facility] Office:[phone]  CC: Gweneth Dimitri, MD

## 2021-02-11 NOTE — Progress Notes (Signed)
The patient has been prescribed the inhaler Breo. Inhaler technique was demonstrated to patient. The patient subsequently demonstrated correct technique.   

## 2021-02-14 ENCOUNTER — Encounter: Payer: Self-pay | Admitting: Internal Medicine

## 2021-02-15 ENCOUNTER — Other Ambulatory Visit (HOSPITAL_COMMUNITY): Payer: Self-pay

## 2021-02-15 NOTE — Telephone Encounter (Signed)
We can try a one time sample of Breo to see if this helps her for a month. Can leave for her to pick up. Is she in the donut hole? All meds might be expensive for her. But let her know Virgel Bouquet should cost about $90.

## 2021-02-15 NOTE — Telephone Encounter (Signed)
Looks like we don't have any samples of anything with a steroid in it. Would let her know Virgel Bouquet should cost $90 but there's nothing we can do if she's in a coverage gap.    She can get airduo through [facility] at [facility] for about $40. That's the cheapest.

## 2021-02-15 NOTE — Telephone Encounter (Signed)
We do not have any samples of Breo.

## 2021-02-15 NOTE — Telephone Encounter (Signed)
Pharmacy please do benefits inquiry for [name], dulera, symbicort, airduo

## 2021-02-15 NOTE — Telephone Encounter (Signed)
Mychart message received from pt: [name] [facility] (supporting Charlott Holler, MD) 13 hours ago (9:06 PM)   the insurance has not approved the inhaler  prescription , and it is about 500 dollars  , the insurance is saying they need more info from you before they will cover , but i assume even if they cover the copay is going to be expensive ? what is your suggestion     Please advise on this for pt.

## 2021-02-16 ENCOUNTER — Emergency Department (HOSPITAL_COMMUNITY): Payer: BC Managed Care – PPO

## 2021-02-16 ENCOUNTER — Encounter (HOSPITAL_COMMUNITY): Payer: Self-pay | Admitting: Emergency Medicine

## 2021-02-16 ENCOUNTER — Encounter: Payer: Self-pay | Admitting: Emergency Medicine

## 2021-02-16 ENCOUNTER — Other Ambulatory Visit: Payer: Self-pay

## 2021-02-16 ENCOUNTER — Emergency Department (HOSPITAL_COMMUNITY)
Admission: EM | Admit: 2021-02-16 | Discharge: 2021-02-17 | Disposition: A | Discharge status: Home / Self Care | Payer: BC Managed Care – PPO | Attending: Emergency Medicine | Admitting: Emergency Medicine

## 2021-02-16 ENCOUNTER — Ambulatory Visit
Admission: EM | Admit: 2021-02-16 | Discharge: 2021-02-16 | Disposition: A | Discharge status: Other Acute Inpatient Hospital | Payer: BC Managed Care – PPO | Source: Home / Self Care

## 2021-02-16 DIAGNOSIS — R112 Nausea with vomiting, unspecified: Secondary | ICD-10-CM

## 2021-02-16 DIAGNOSIS — Z79899 Other long term (current) drug therapy: Secondary | ICD-10-CM | POA: Insufficient documentation

## 2021-02-16 DIAGNOSIS — Z96653 Presence of artificial knee joint, bilateral: Secondary | ICD-10-CM | POA: Diagnosis not present

## 2021-02-16 DIAGNOSIS — R1084 Generalized abdominal pain: Secondary | ICD-10-CM

## 2021-02-16 DIAGNOSIS — R1012 Left upper quadrant pain: Secondary | ICD-10-CM

## 2021-02-16 DIAGNOSIS — R1031 Right lower quadrant pain: Secondary | ICD-10-CM | POA: Diagnosis not present

## 2021-02-16 DIAGNOSIS — N200 Calculus of kidney: Secondary | ICD-10-CM | POA: Diagnosis not present

## 2021-02-16 DIAGNOSIS — R300 Dysuria: Secondary | ICD-10-CM | POA: Insufficient documentation

## 2021-02-16 DIAGNOSIS — R1030 Lower abdominal pain, unspecified: Secondary | ICD-10-CM | POA: Diagnosis not present

## 2021-02-16 DIAGNOSIS — E039 Hypothyroidism, unspecified: Secondary | ICD-10-CM | POA: Diagnosis not present

## 2021-02-16 DIAGNOSIS — R109 Unspecified abdominal pain: Secondary | ICD-10-CM | POA: Diagnosis not present

## 2021-02-16 LAB — CBC WITH DIFFERENTIAL/PLATELET
Abs Immature Granulocytes: 0.02 10*3/uL (ref 0.00–0.07)
Basophils Absolute: 0 10*3/uL (ref 0.0–0.1)
Basophils Relative: 1 %
Eosinophils Absolute: 0.4 10*3/uL (ref 0.0–0.5)
Eosinophils Relative: 6 %
HCT: 40.9 % (ref 36.0–46.0)
Hemoglobin: 12.5 g/dL (ref 12.0–15.0)
Immature Granulocytes: 0 %
Lymphocytes Relative: 42 %
Lymphs Abs: 2.6 10*3/uL (ref 0.7–4.0)
MCH: 26.2 pg (ref 26.0–34.0)
MCHC: 30.6 g/dL (ref 30.0–36.0)
MCV: 85.7 fL (ref 80.0–100.0)
Monocytes Absolute: 0.4 10*3/uL (ref 0.1–1.0)
Monocytes Relative: 6 %
Neutro Abs: 2.8 10*3/uL (ref 1.7–7.7)
Neutrophils Relative %: 45 %
Platelets: 270 10*3/uL (ref 150–400)
RBC: 4.77 MIL/uL (ref 3.87–5.11)
RDW: 14 % (ref 11.5–15.5)
WBC: 6.2 10*3/uL (ref 4.0–10.5)
nRBC: 0 % (ref 0.0–0.2)

## 2021-02-16 LAB — COMPREHENSIVE METABOLIC PANEL
ALT: 14 U/L (ref 0–44)
AST: 18 U/L (ref 15–41)
Albumin: 3.8 g/dL (ref 3.5–5.0)
Alkaline Phosphatase: 73 U/L (ref 38–126)
Anion gap: 9 (ref 5–15)
BUN: 15 mg/dL (ref 6–20)
CO2: 32 mmol/L (ref 22–32)
Calcium: 9.6 mg/dL (ref 8.9–10.3)
Chloride: 97 mmol/L — ABNORMAL LOW (ref 98–111)
Creatinine, Ser: 1 mg/dL (ref 0.44–1.00)
GFR, Estimated: >60 mL/min (ref >60)
Glucose, Bld: 117 mg/dL — ABNORMAL HIGH (ref 70–99)
Potassium: 3.1 mmol/L — ABNORMAL LOW (ref 3.5–5.1)
Sodium: 138 mmol/L (ref 135–145)
Total Bilirubin: 0.4 mg/dL (ref 0.3–1.2)
Total Protein: 6.6 g/dL (ref 6.5–8.1)

## 2021-02-16 LAB — URINALYSIS, ROUTINE W REFLEX MICROSCOPIC
Bilirubin Urine: NEGATIVE
Glucose, UA: NEGATIVE mg/dL
Hgb urine dipstick: NEGATIVE
Ketones, ur: NEGATIVE mg/dL
Nitrite: NEGATIVE
Protein, ur: NEGATIVE mg/dL
Specific Gravity, Urine: 1.015 (ref 1.005–1.030)
pH: 6.5 (ref 5.0–8.0)

## 2021-02-16 LAB — URINALYSIS, MICROSCOPIC (REFLEX): Bacteria, UA: NONE SEEN

## 2021-02-16 LAB — POCT URINALYSIS DIP (MANUAL ENTRY)
Glucose, UA: NEGATIVE mg/dL
Ketones, POC UA: NEGATIVE mg/dL
Leukocytes, UA: NEGATIVE
Nitrite, UA: NEGATIVE
Spec Grav, UA: 1.02 (ref 1.010–1.025)
Urobilinogen, UA: 0.2 E.U./dL
pH, UA: 7 (ref 5.0–8.0)

## 2021-02-16 LAB — LIPASE, BLOOD: Lipase: 29 U/L (ref 11–51)

## 2021-02-16 MED ORDER — OXYCODONE-ACETAMINOPHEN 5-325 MG PO TABS
1.0000 | ORAL_TABLET | Freq: Once | ORAL | Status: AC
  Administered 2021-02-16: 1 via ORAL
  Filled 2021-02-16: qty 1

## 2021-02-16 NOTE — ED Provider Notes (Signed)
Emergency Medicine Provider Triage Evaluation Note  [name] [facility] , a 55 y.o. female  was evaluated in triage.  Pt complains of diffuse lower abdominal pain and bilateral back pain for the past 4 days.  She also complains of nausea, vomiting, burning with urination and fever.  She went to urgent care today however they were concerned that she seemed confused and sent her to the ED for further evaluation.  Husband is at bedside.  States that she has been acting at baseline.  She does have a history of long COVID and has difficulties with remembering some questions at baseline.  Surgical history includes cholecystectomy as well as hysterectomy.  Review of Systems  Positive: + abd pain, back pain, nausea, vomiting, fever, dysuria Negative: - diarrhea  Physical Exam  BP (!) 144/100   Pulse 90   Temp 98.4 F (36.9 C) (Oral)   Resp 19   SpO2 95%  Gen:   Awake, no distress   Resp:  Normal effort  MSK:   Moves extremities without difficulty  Other:  Diffuse abd TTP however worse in RLQ with right CVA TTP  Medical Decision Making  Medically screening exam initiated at 5:47 PM.  Appropriate orders placed.  Zara Council Cormany was informed that the remainder of the evaluation will be completed by another provider, this initial triage assessment does not replace that evaluation, and the importance of remaining in the ED until their evaluation is complete.     Tanda Rockers, PA-C 02/16/21 1749    Gerhard Munch, MD 02/16/21 1800

## 2021-02-16 NOTE — Discharge Instructions (Signed)
Please go to the hospital as soon as you leave urgent care for further evaluation and management. 

## 2021-02-16 NOTE — ED Triage Notes (Addendum)
Dx with long covid in October. Took 3 weeks of antibiotics. Very fragmented with her history and complaint. Believes she has a UTI. Burning with urination. Has problems with chronic kidney stones, is on a medication from her urologist that turns her urine green. Noticed over the weekend that her urine was turning a darker color and she began to started having the lower abdominal pain and back pain. Began vomiting two days prior

## 2021-02-16 NOTE — ED Provider Notes (Signed)
[facility]    CSN: [id number] Arrival date & time: 02/16/21  1307      History   Chief Complaint Chief Complaint  Patient presents with   Dysuria    HPI [name] is a 55 y.o. female.   Patient is a poor historian and seems disoriented during exam, so patient's physical and history is difficult to complete.  Patient starts by saying that she had "long COVID" sometime in October and had to take 3 weeks worth of antibiotics.  Patient thought that she had yeast infection from antibiotics, although denies vaginal discharge.  Patient has also had some urinary burning.  Denies vaginal discharge, hematuria, pelvic pain.  Patient does endorse that she started having nausea, vomiting, abdominal pain, fever over the past 2 to 3 days.  Temp max at home was 101.  Abdominal pain is in the lower portion but also having some abdominal pain in the left upper quadrant.  Denies chest pain or shortness of breath.  She is also noticed that her urine is darker in color.  She reports that she takes Uribel as she is followed by urologist for interstitial cystitis, stone disease, urethral syndrome, recurrent bladder pain.  The Uribel turns her urine green, but patient reports that her urine has been a darker green in color.  She also having some bilateral back pain.   Dysuria  Past Medical History:  Diagnosis Date   Anemia    Anxiety    Bronchitis    hx of    Cervical pain (neck)    Chronic pancreatitis (HCC)    Closed fracture of base of fifth metatarsal bone of right foot at metaphyseal-diaphyseal junction    Closed fracture of left lateral malleolus 12/29/2017   saw podiatry at [facility] and was  prescribed bone growth stimulator device to use before she has knee surgery    Constipation    DDD (degenerative disc disease), cervical    Depression    GERD (gastroesophageal reflux disease)    History of kidney stones    History of pancreatitis 01-04-2011   Hypothyroidism     IBS (irritable bowel syndrome)    Nausea DUE TO PAIN FROM KIDNEY STONE   Pancreatic pseudocyst    Pneumonia    hx of pneumonia    PONV (postoperative nausea and vomiting)    Right flank pain    Right knee pain    Right ureteral stone    Swelling of right knee joint    Urinary tract infection    hx of     Patient Active Problem List   Diagnosis Date Noted   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   OA (osteoarthritis) of knee 12/13/2015   Obese 09/01/2014   S/P revision right TKA 08/31/2014   S/P revision of total knee 08/31/2014   S/P right TKA 01/13/2014   S/P knee replacement 01/13/2014   Acute bronchitis 04/28/2013   Chest pain 04/28/2013   Chronic cough 02/02/2013   Hypoxia in the setting of recent bronchitis and cough 06/07/2012   Toxic encephalopathy 06/07/2012   Leukocytosis 99991111   Metabolic encephalopathy 99991111   UTI (lower urinary tract infection) 07/27/2011   Hx of pancreatitis 07/27/2011   Anemia 07/27/2011   Hypothyroid 07/27/2011   Depression 07/27/2011   GERD (gastroesophageal reflux disease) 07/27/2011   Pancreatic pseudocyst 07/27/2011    Past Surgical History:  Procedure Laterality Date   ABDOMINAL HYSTERECTOMY     ANAL RECTAL MANOMETRY N/A 09/04/2016  Procedure: ANO RECTAL MANOMETRY;  Surgeon: Willis Modena, MD;  Location: [facility];  Service: Endoscopy;  Laterality: N/A;   ANTERIOR LUMBAR FUSION  11-09-2010    L5 - S1   BACK SURGERY  2012   lower back surgery    CERVICAL FUSION  1999   C4 - 7   CERVICAL SPINE SURGERY  APRIL 2011   REMOVAL SCAR TISSUE   colonscopy      CYSTO/ RIGHT RETROGRADE PYELOGRAM/ RIGHT URETEROSCOPY AND STENT PLACEMENT  09-24-2009   HX RIGHT URETERAL CALCULUS   CYSTOSCOPY WITH URETEROSCOPY  12/20/2011   Procedure: CYSTOSCOPY WITH URETEROSCOPY;  Surgeon: Milford Cage, MD;  Location: [facility];  Service: Urology;  Laterality: Right;   DIAGNOSTIC LAPAROSCOPY  1992    ENDOMETRIOSIS   EUS  03/01/2011   Procedure: UPPER ENDOSCOPIC ULTRASOUND (EUS) LINEAR;  Surgeon: Freddy Jaksch, MD;  Location: [facility];  Service: Endoscopy;  Laterality: N/A;   FINE NEEDLE ASPIRATION  03/01/2011   Procedure: FINE NEEDLE ASPIRATION (FNA) LINEAR;  Surgeon: Freddy Jaksch, MD;  Location: [facility];  Service: Endoscopy;  Laterality: N/A;   HERNIA REPAIR  AS CHILD   JOINT REPLACEMENT     KNEE ARTHROSCOPY  AUG 2013   RIGHT KNEE   LAPAROSCOPIC ASSISTED VAGINAL HYSTERECTOMY  1997   LAPAROSCOPIC CHOLECYSTECTOMY  01-09-2011   ROBOTIC-ASSISTED RIGHT SALPINGO-OOPHORECTOMY W/ LYSIS ADHESIONS  12-21-2009   HX ENDOMETRIOSIS AND PELVIC PAIN   TONSILLECTOMY  AS CHILD   TOTAL KNEE ARTHROPLASTY Right 01/13/2014   Procedure: RIGHT TOTAL KNEE ARTHROPLASTY;  Surgeon: Shelda Pal, MD;  Location: [facility];  Service: Orthopedics;  Laterality: Right;   TOTAL KNEE ARTHROPLASTY Left 12/13/2015   Procedure: LEFT TOTAL KNEE ARTHROPLASTY;  Surgeon: Ollen Gross, MD;  Location: [facility];  Service: Orthopedics;  Laterality: Left;   TOTAL KNEE REVISION Right 08/31/2014   Procedure: RIGHT TOTAL KNEE ARTHROPLASTY RESECTION;  Surgeon: Durene Romans, MD;  Location: [facility];  Service: Orthopedics;  Laterality: Right;   TOTAL KNEE REVISION Right 01/09/2018   Procedure: RIGHT TOTAL KNEE REVISION;  Surgeon: Ollen Gross, MD;  Location: [facility];  Service: Orthopedics;  Laterality: Right;  Adductor Block   URETEROSCOPIC STONE EXTRACTIONS     X3    OB History   No obstetric history on file.      Home Medications    Prior to Admission medications   Medication Sig Start Date End Date Taking? Authorizing Provider  albuterol (PROVENTIL HFA;VENTOLIN HFA) 108 (90 BASE) MCG/ACT inhaler Inhale 2 puffs into the lungs every 4 (four) hours as needed for wheezing or shortness of breath.     [provider]  ALPRAZolam Prudy Feeler) 1 MG tablet Take 1 mg by mouth 3 (three) times daily as needed for anxiety.      [provider]  azelastine (ASTELIN) 0.1 % nasal spray 1 puff in each nostril 05/26/20   [provider]  benzonatate (TESSALON) 200 MG capsule 1 capsule    [provider]  diclofenac sodium (VOLTAREN) 1 % GEL Apply 1 application topically 4 (four) times daily as needed (for pain).     [provider]  docusate sodium (COLACE) 100 MG capsule Take 1 capsule (100 mg total) by mouth 2 (two) times daily. Patient taking differently: Take 200 mg by mouth at bedtime as needed for moderate constipation. 09/01/14   Lanney Gins, PA-C  DULoxetine (CYMBALTA) 60 MG capsule Take 60 mg by mouth 2 (two) times daily.  [provider]  fluticasone furoate-vilanterol (BREO ELLIPTA) 200-25 MCG/ACT AEPB Inhale 1 puff into the lungs daily. 02/11/21   Charlott Holleresai, Nikita S, MD  hydrochlorothiazide (HYDRODIURIL) 25 MG tablet Take 25 mg by mouth daily.     [provider]  hydrocortisone-pramoxine [facility](PROCTOFOAM-HC) rectal foam Place 1 applicator rectally 2 (two) times daily as needed for hemorrhoids.    [provider]  ipratropium-albuterol (DUONEB) 0.5-2.5 (3) MG/3ML SOLN SMARTSIG:3 Milliliter(s) Via Nebulizer Every 6 Hours PRN 12/23/20   [provider]  levothyroxine (SYNTHROID, LEVOTHROID) 125 MCG tablet Take 125 mcg by mouth daily before breakfast.    [provider]  lipase/protease/amylase (CREON) 36000 UNITS CPEP capsule Take 36,000 Units by mouth 3 (three) times daily with meals.     [provider]  loratadine (CLARITIN) 10 MG tablet Take 10 mg by mouth daily as needed for allergies.    [provider]  Meth-Hyo-M Bl-Na Phos-Ph Sal (URIBEL) 118 MG CAPS Take 118 mg by mouth 4 (four) times daily as needed (for UTI).     [provider]  methocarbamol (ROBAXIN) 500 MG tablet Take 1 tablet (500 mg total) by mouth every 6 (six) hours as needed for muscle spasms. 01/10/18   Edmisten, Lyn HollingsheadKristie L, PA  nitrofurantoin,  macrocrystal-monohydrate, (MACROBID) 100 MG capsule Take 100 mg by mouth daily.    [provider]  NUCYNTA ER 150 MG TB12 Take 150 mg by mouth every 12 (twelve) hours. 12/10/17   [provider]  omeprazole (PRILOSEC) 20 MG capsule 1 capsule 30 minutes before morning meal 01/25/21   [provider]  oxyCODONE (OXY IR/ROXICODONE) 5 MG immediate release tablet Take 1-2 tablets (5-10 mg total) by mouth every 6 (six) hours as needed for moderate pain (pain score 4-6). 01/10/18   Edmisten, Lyn HollingsheadKristie L, PA  polycarbophil (FIBERCON) 625 MG tablet Take 625 mg by mouth 3 (three) times daily.     [provider]  pregabalin (LYRICA) 300 MG capsule Take 300 mg by mouth 2 (two) times daily.    [provider]  promethazine (PHENERGAN) 25 MG tablet Take 1 tablet (25 mg total) by mouth 3 (three) times daily as needed for nausea. Patient taking differently: Take 25 mg by mouth every 8 (eight) hours as needed for nausea or vomiting. 09/01/14   Lanney GinsBabish, Matthew, PA-C  sodium chloride (OCEAN) 0.65 % SOLN nasal spray Place 1 spray into both nostrils daily as needed for congestion.     [provider]    Family History Family History  Problem Relation Age of Onset   Constipation Mother    Cancer Father    Fibromyalgia Sister     Social History Social History   Tobacco Use   Smoking status: Never   Smokeless tobacco: Never  Substance Use Topics   Alcohol use: No   Drug use: No     Allergies   Deltasone [prednisone], Methylprednisolone sodium succ, Solu-medrol [methylprednisolone], Fentanyl, Septra ds [sulfamethoxazole-trimethoprim], Dilaudid [hydromorphone hcl], and Zolpidem tartrate   Review of Systems Review of Systems Per HPI  Physical Exam Triage Vital Signs ED Triage Vitals  Enc Vitals Group     BP 02/16/21 1432 125/86     Pulse Rate 02/16/21 1432 76     Resp 02/16/21 1432 16     Temp 02/16/21 1432 98 F (36.7 C)     Temp Source  02/16/21 1432 Oral     SpO2 02/16/21 1432 96 %     Weight --  Height --      Head Circumference --      Peak Flow --      Pain Score 02/16/21 1433 7     Pain Loc --      Pain Edu? --      Excl. in [place]? --    No data found.  Updated Vital Signs BP 125/86 (BP Location: Right Arm)   Pulse 76   Temp 98 F (36.7 C) (Oral)   Resp 16   SpO2 96%   Visual Acuity Right Eye Distance:   Left Eye Distance:   Bilateral Distance:    Right Eye Near:   Left Eye Near:    Bilateral Near:     Physical Exam Constitutional:      General: She is not in acute distress.    Appearance: Normal appearance. She is not toxic-appearing or diaphoretic.     Comments: Patient having difficulty remembering timing of symptoms and what symptoms she is having during history.  Patient appears a disoriented at times.  HENT:     Head: Normocephalic and atraumatic.  Eyes:     Extraocular Movements: Extraocular movements intact.     Conjunctiva/sclera: Conjunctivae normal.  Cardiovascular:     Rate and Rhythm: Normal rate.     Pulses: Normal pulses.     Heart sounds: Normal heart sounds.  Pulmonary:     Effort: Pulmonary effort is normal. No respiratory distress.     Breath sounds: Normal breath sounds.  Abdominal:     Palpations: Abdomen is soft.     Tenderness: There is abdominal tenderness in the right lower quadrant, suprapubic area, left upper quadrant and left lower quadrant. There is no right CVA tenderness, left CVA tenderness, guarding or rebound. Negative signs include Murphy's sign, Rovsing's sign, McBurney's sign, psoas sign and obturator sign.     Comments: Patient cringed and jumped when palpation to left upper quadrant was performed.   Skin:    General: Skin is warm and dry.     Coloration: Skin is pale.  Neurological:     General: No focal deficit present.     Mental Status: She is alert and oriented to person, place, and time. Mental status is at baseline.  Psychiatric:        Mood  and Affect: Mood normal.        Behavior: Behavior normal.        Thought Content: Thought content normal.        Judgment: Judgment normal.     UC Treatments / Results  Labs (all labs ordered are listed, but only abnormal results are displayed) Labs Reviewed  POCT URINALYSIS DIP (MANUAL ENTRY) - Abnormal; Notable for the following components:      Result Value   Color, UA green (*)    Bilirubin, UA moderate (*)    Blood, UA trace-intact (*)    Protein Ur, POC trace (*)    All other components within normal limits  URINE CULTURE    EKG   Radiology No results found.  Procedures Procedures (including critical care time)  Medications Ordered in UC Medications - No data to display  Initial Impression / Assessment and Plan / UC Course  I have reviewed the triage vital signs and the nursing notes.  Pertinent labs & imaging results that were available during my care of the patient were reviewed by me and considered in my medical decision making (see chart for details).     Unsure of  patient's baseline mental status, but patient does report that she has felt "confused" over the past 2 to 3 days.  Patient does appear disoriented, and patient's history is very jumbled.  Patient also appears to have severe abdominal pain in the left upper quadrant.  She also has some bilirubin noted in urine.  Urinalysis is not definitive for urinary tract infection.  It appears that patient's symptoms and mental status are too complex for management at urgent care.  Patient will need further evaluation and management at the hospital.  Advised patient that she would need to go to the hospital for further evaluation and management.  Patient was agreeable with plan.  Vital signs stable at discharge.  Patient called family member to transport her to the hospital. Urine culture discontinued due to patient being sent to hospital.  Final Clinical Impressions(s) / UC Diagnoses   Final diagnoses:  Dysuria   Nausea and vomiting, unspecified vomiting type  Left upper quadrant abdominal pain     Discharge Instructions      Please go to the hospital as soon as you leave urgent care for further evaluation and management.    ED Prescriptions   None    PDMP not reviewed this encounter.   Teodora Medici, [place] 02/16/21 [phone]

## 2021-02-16 NOTE — ED Triage Notes (Signed)
Pt c/o lower abdominal pain with nausea/vomiting and burning with urination that started Saturday. Hx kidney stones. Sent by UC for further evaluation.

## 2021-02-17 ENCOUNTER — Emergency Department (HOSPITAL_COMMUNITY): Payer: BC Managed Care – PPO

## 2021-02-17 DIAGNOSIS — R109 Unspecified abdominal pain: Secondary | ICD-10-CM | POA: Diagnosis not present

## 2021-02-17 DIAGNOSIS — N200 Calculus of kidney: Secondary | ICD-10-CM | POA: Diagnosis not present

## 2021-02-17 MED ORDER — OXYCODONE-ACETAMINOPHEN 5-325 MG PO TABS
1.0000 | ORAL_TABLET | Freq: Once | ORAL | Status: AC
  Administered 2021-02-17: 1 via ORAL
  Filled 2021-02-17: qty 1

## 2021-02-17 MED ORDER — ONDANSETRON HCL 4 MG/2ML IJ SOLN
4.0000 mg | Freq: Once | INTRAMUSCULAR | Status: AC
  Administered 2021-02-17: 4 mg via INTRAVENOUS
  Filled 2021-02-17: qty 2

## 2021-02-17 MED ORDER — IOHEXOL 300 MG/ML  SOLN
100.0000 mL | Freq: Once | INTRAMUSCULAR | Status: AC | PRN
  Administered 2021-02-17: 100 mL via INTRAVENOUS

## 2021-02-17 MED ORDER — ONDANSETRON 4 MG PO TBDP: 4.0000 mg | ORAL_TABLET | Freq: Three times a day (TID) | ORAL | 0 refills | Status: DC | PRN

## 2021-02-17 MED ORDER — MORPHINE SULFATE (PF) 4 MG/ML IV SOLN
4.0000 mg | Freq: Once | INTRAVENOUS | Status: AC
  Administered 2021-02-17: 4 mg via INTRAVENOUS
  Filled 2021-02-17: qty 1

## 2021-02-17 MED ORDER — SODIUM CHLORIDE 0.9 % IV BOLUS
500.0000 mL | Freq: Once | INTRAVENOUS | Status: AC
  Administered 2021-02-17: 500 mL via INTRAVENOUS

## 2021-02-17 NOTE — ED Provider Notes (Signed)
[facility] EMERGENCY DEPARTMENT Provider Note   CSN: [id number] Arrival date & time: 02/16/21  1659     History Chief Complaint  Patient presents with   Abdominal Pain    [name] is a 55 y.o. female.  Presenting to the ER with concern for abdominal pain.  She reports that she has been generally unwell ever since having COVID in the beginning of October, believes that she has developed long COVID.  However over the last few days started having worsening abdominal pain nausea and vomiting.  Pain primarily in lower abdomen, wraps around to her back.  Relatively constant.  Has not vomited recently but also has not tried to eat or drink anything since being in the ER.  Reports that she went to an urgent care and was told to come to ER for further evaluation.  Has had low-grade temperature.  Notably, past surgical history of cholecystectomy.  HPI     Past Medical History:  Diagnosis Date   Anemia    Anxiety    Bronchitis    hx of    Cervical pain (neck)    Chronic pancreatitis (HCC)    Closed fracture of base of fifth metatarsal bone of right foot at metaphyseal-diaphyseal junction    Closed fracture of left lateral malleolus 12/29/2017   [facility] and was  prescribed bone growth stimulator device to use before she has knee surgery    Constipation    DDD (degenerative disc disease), cervical    Depression    GERD (gastroesophageal reflux disease)    History of kidney stones    History of pancreatitis 01-04-2011   Hypothyroidism    IBS (irritable bowel syndrome)    Nausea DUE TO PAIN FROM KIDNEY STONE   Pancreatic pseudocyst    Pneumonia    hx of pneumonia    PONV (postoperative nausea and vomiting)    Right flank pain    Right knee pain    Right ureteral stone    Swelling of right knee joint    Urinary tract infection    hx of     Patient Active Problem List   Diagnosis Date Noted   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   OA (osteoarthritis) of knee 12/13/2015   Obese 09/01/2014   S/P revision right TKA 08/31/2014   S/P revision of total knee 08/31/2014   S/P right TKA 01/13/2014   S/P knee replacement 01/13/2014   Acute bronchitis 04/28/2013   Chest pain 04/28/2013   Chronic cough 02/02/2013   Hypoxia in the setting of recent bronchitis and cough 06/07/2012   Toxic encephalopathy 06/07/2012   Leukocytosis 99991111   Metabolic encephalopathy 99991111   UTI (lower urinary tract infection) 07/27/2011   Hx of pancreatitis 07/27/2011   Anemia 07/27/2011   Hypothyroid 07/27/2011   Depression 07/27/2011   GERD (gastroesophageal reflux disease) 07/27/2011   Pancreatic pseudocyst 07/27/2011    Past Surgical History:  Procedure Laterality Date   ABDOMINAL HYSTERECTOMY     ANAL RECTAL MANOMETRY N/A 09/04/2016   Procedure: ANO RECTAL MANOMETRY;  Surgeon: Arta Silence, MD;  Location: [facility];  Service: Endoscopy;  Laterality: N/A;   ANTERIOR LUMBAR FUSION  11-09-2010    L5 - S1   BACK SURGERY  2012   lower back surgery    CERVICAL FUSION  1999   C4 - 7   CERVICAL SPINE SURGERY  APRIL 2011   REMOVAL SCAR TISSUE   colonscopy  CYSTO/ RIGHT RETROGRADE PYELOGRAM/ RIGHT URETEROSCOPY AND STENT PLACEMENT  09-24-2009   HX RIGHT URETERAL CALCULUS   CYSTOSCOPY WITH URETEROSCOPY  12/20/2011   Procedure: CYSTOSCOPY WITH URETEROSCOPY;  Surgeon: Molli Hazard, MD;  Location[facility];  Service: Urology;  Laterality: Right;   DIAGNOSTIC LAPAROSCOPY  1992   ENDOMETRIOSIS   EUS  03/01/2011   Procedure: UPPER ENDOSCOPIC ULTRASOUND (EUS) LINEAR;  Surgeon: Landry Dyke, MD;  Location: [facility];  Service: Endoscopy;  Laterality: N/A;   FINE NEEDLE ASPIRATION  03/01/2011   Procedure: FINE NEEDLE ASPIRATION (FNA) LINEAR;  Surgeon: Landry Dyke, MD;  Location: [facility];  Service: Endoscopy;  Laterality: N/A;   HERNIA REPAIR  AS CHILD   JOINT  REPLACEMENT     KNEE ARTHROSCOPY  AUG 2013   RIGHT KNEE   LAPAROSCOPIC ASSISTED VAGINAL HYSTERECTOMY  1997   LAPAROSCOPIC CHOLECYSTECTOMY  01-09-2011   ROBOTIC-ASSISTED RIGHT SALPINGO-OOPHORECTOMY W/ LYSIS ADHESIONS  12-21-2009   HX ENDOMETRIOSIS AND PELVIC PAIN   TONSILLECTOMY  AS CHILD   TOTAL KNEE ARTHROPLASTY Right 01/13/2014   Procedure: RIGHT TOTAL KNEE ARTHROPLASTY;  Surgeon: Mauri Pole, MD;  Location: [facility];  Service: Orthopedics;  Laterality: Right;   TOTAL KNEE ARTHROPLASTY Left 12/13/2015   Procedure: LEFT TOTAL KNEE ARTHROPLASTY;  Surgeon: Gaynelle Arabian, MD;  Location: [facility];  Service: Orthopedics;  Laterality: Left;   TOTAL KNEE REVISION Right 08/31/2014   Procedure: RIGHT TOTAL KNEE ARTHROPLASTY RESECTION;  Surgeon: Paralee Cancel, MD;  Location: [facility];  Service: Orthopedics;  Laterality: Right;   TOTAL KNEE REVISION Right 01/09/2018   Procedure: RIGHT TOTAL KNEE REVISION;  Surgeon: Gaynelle Arabian, MD;  Location: [facility];  Service: Orthopedics;  Laterality: Right;  Adductor Block   URETEROSCOPIC STONE EXTRACTIONS     X3     OB History   No obstetric history on file.     Family History  Problem Relation Age of Onset   Constipation Mother    Cancer Father    Fibromyalgia Sister     Social History   Tobacco Use   Smoking status: Never   Smokeless tobacco: Never  Substance Use Topics   Alcohol use: No   Drug use: No    Home Medications Prior to Admission medications   Medication Sig Start Date End Date Taking? Authorizing Provider  ondansetron (ZOFRAN-ODT) 4 MG disintegrating tablet Take 1 tablet (4 mg total) by mouth every 8 (eight) hours as needed for nausea or vomiting. 02/17/21  Yes Lucrezia Starch, MD  albuterol (PROVENTIL HFA;VENTOLIN HFA) 108 (90 BASE) MCG/ACT inhaler Inhale 2 puffs into the lungs every 4 (four) hours as needed for wheezing or shortness of breath.     [provider]  ALPRAZolam Duanne Moron) 1 MG tablet Take 1 mg by mouth 3  (three) times daily as needed for anxiety.     [provider]  azelastine (ASTELIN) 0.1 % nasal spray 1 puff in each nostril 05/26/20   [provider]  benzonatate (TESSALON) 200 MG capsule 1 capsule    [provider]  diclofenac sodium (VOLTAREN) 1 % GEL Apply 1 application topically 4 (four) times daily as needed (for pain).     [provider]  docusate sodium (COLACE) 100 MG capsule Take 1 capsule (100 mg total) by mouth 2 (two) times daily. Patient taking differently: Take 200 mg by mouth at bedtime as needed for moderate constipation. 09/01/14   Danae Orleans, PA-C  DULoxetine (CYMBALTA) 60 MG  capsule Take 60 mg by mouth 2 (two) times daily.    [provider]  fluticasone furoate-vilanterol (BREO ELLIPTA) 200-25 MCG/ACT AEPB Inhale 1 puff into the lungs daily. 02/11/21   Spero Geralds, MD  hydrochlorothiazide (HYDRODIURIL) 25 MG tablet Take 25 mg by mouth daily.     [provider]  hydrocortisone-pramoxine [facility] rectal foam Place 1 applicator rectally 2 (two) times daily as needed for hemorrhoids.    [provider]  ipratropium-albuterol (DUONEB) 0.5-2.5 (3) MG/3ML SOLN SMARTSIG:3 Milliliter(s) Via Nebulizer Every 6 Hours PRN 12/23/20   [provider]  levothyroxine (SYNTHROID, LEVOTHROID) 125 MCG tablet Take 125 mcg by mouth daily before breakfast.    [provider]  lipase/protease/amylase (CREON) 36000 UNITS CPEP capsule Take 36,000 Units by mouth 3 (three) times daily with meals.     [provider]  loratadine (CLARITIN) 10 MG tablet Take 10 mg by mouth daily as needed for allergies.    [provider]  Meth-Hyo-M Bl-Na Phos-Ph Sal (URIBEL) 118 MG CAPS Take 118 mg by mouth 4 (four) times daily as needed (for UTI).     [provider]  methocarbamol (ROBAXIN) 500 MG tablet Take 1 tablet (500 mg total) by mouth every 6 (six) hours as needed for muscle spasms. 01/10/18    Edmisten, Ok Anis, PA  nitrofurantoin, macrocrystal-monohydrate, (MACROBID) 100 MG capsule Take 100 mg by mouth daily.    [provider]  NUCYNTA ER 150 MG TB12 Take 150 mg by mouth every 12 (twelve) hours. 12/10/17   [provider]  omeprazole (PRILOSEC) 20 MG capsule 1 capsule 30 minutes before morning meal 01/25/21   [provider]  oxyCODONE (OXY IR/ROXICODONE) 5 MG immediate release tablet Take 1-2 tablets (5-10 mg total) by mouth every 6 (six) hours as needed for moderate pain (pain score 4-6). 01/10/18   Edmisten, Ok Anis, PA  polycarbophil (FIBERCON) 625 MG tablet Take 625 mg by mouth 3 (three) times daily.     [provider]  pregabalin (LYRICA) 300 MG capsule Take 300 mg by mouth 2 (two) times daily.    [provider]  promethazine (PHENERGAN) 25 MG tablet Take 1 tablet (25 mg total) by mouth 3 (three) times daily as needed for nausea. Patient taking differently: Take 25 mg by mouth every 8 (eight) hours as needed for nausea or vomiting. 09/01/14   Danae Orleans, PA-C  sodium chloride (OCEAN) 0.65 % SOLN nasal spray Place 1 spray into both nostrils daily as needed for congestion.     [provider]    Allergies    Deltasone [prednisone], Methylprednisolone sodium succ, Solu-medrol [methylprednisolone], Fentanyl, Septra ds [sulfamethoxazole-trimethoprim], Dilaudid [hydromorphone hcl], and Zolpidem tartrate  Review of Systems   Review of Systems  Constitutional:  Positive for chills and fatigue. Negative for fever.  HENT:  Negative for ear pain and sore throat.   Eyes:  Negative for pain and visual disturbance.  Respiratory:  Negative for cough and shortness of breath.   Cardiovascular:  Negative for chest pain and palpitations.  Gastrointestinal:  Positive for abdominal pain, nausea and vomiting.  Genitourinary:  Negative for dysuria and hematuria.  Musculoskeletal:  Negative for arthralgias and back pain.  Skin:   Negative for color change and rash.  Neurological:  Negative for seizures and syncope.  All other systems reviewed and are negative.  Physical Exam Updated Vital Signs BP 108/76   Pulse 62   Temp 98 F (36.7 C) (Oral)   Resp  12   SpO2 100%   Physical Exam Vitals and nursing note reviewed.  Constitutional:      General: She is not in acute distress.    Appearance: She is well-developed.  HENT:     Head: Normocephalic and atraumatic.  Eyes:     Conjunctiva/sclera: Conjunctivae normal.  Cardiovascular:     Rate and Rhythm: Normal rate and regular rhythm.     Heart sounds: No murmur heard. Pulmonary:     Effort: Pulmonary effort is normal. No respiratory distress.     Breath sounds: Normal breath sounds.  Abdominal:     Palpations: Abdomen is soft.     Tenderness: There is abdominal tenderness in the right lower quadrant and left lower quadrant. There is no guarding or rebound.  Musculoskeletal:        General: No swelling.     Cervical back: Neck supple.  Skin:    General: Skin is warm and dry.     Capillary Refill: Capillary refill takes less than 2 seconds.  Neurological:     Mental Status: She is alert.  Psychiatric:        Mood and Affect: Mood normal.    ED Results / Procedures / Treatments   Labs (all labs ordered are listed, but only abnormal results are displayed) Labs Reviewed  COMPREHENSIVE METABOLIC PANEL - Abnormal; Notable for the following components:      Result Value   Potassium 3.1 (*)    Chloride 97 (*)    Glucose, Bld 117 (*)    All other components within normal limits  URINALYSIS, ROUTINE W REFLEX MICROSCOPIC - Abnormal; Notable for the following components:   Color, Urine GREEN (*)    Leukocytes,Ua TRACE (*)    All other components within normal limits  LIPASE, BLOOD  CBC WITH DIFFERENTIAL/PLATELET  URINALYSIS, MICROSCOPIC (REFLEX)    EKG None  Radiology CT Abdomen Pelvis W Contrast  Result Date: 02/17/2021 CLINICAL DATA:   Abdominal pain, acute, nonlocalized. Low back pain radiating to front of abdomen. EXAM: CT ABDOMEN AND PELVIS WITH CONTRAST TECHNIQUE: Multidetector CT imaging of the abdomen and pelvis was performed using the standard protocol following bolus administration of intravenous contrast. CONTRAST:  OMNIPAQUE IOHEXOL 300 MG/ML  SOLN COMPARISON:  06/25/2016. FINDINGS: Lower chest: No acute abnormality. Hepatobiliary: There is an 8 mm hypodensity in the left lobe of the liver, most likely representing a cyst. No biliary ductal dilatation. The gallbladder is surgically absent. Pancreas: Mild pancreatic atrophy. No pancreatic ductal dilatation or inflammatory changes. Spleen: Normal in size without focal abnormality. Adrenals/Urinary Tract: No adrenal nodule or mass. The kidneys enhance symmetrically. A punctate renal calculus is noted in the lower pole the left kidney. No ureteral calculus or obstructive uropathy bilaterally. The bladder is partially distended and otherwise within normal limits. Stomach/Bowel: No bowel obstruction, free air or pneumatosis. A normal appendix is seen in the right lower quadrant. There is fatty infiltration of the walls the rectosigmoid colon suggesting chronic inflammatory changes. The descending colon is minimally distended. A moderate amount of retained stool is present in the ascending and transverse colon. Vascular/Lymphatic: A nonspecific prominent lymph node are seen in the retroperitoneum measuring up to 1.3 cm in short axis diameter. Reproductive: Status post hysterectomy. No adnexal masses. Other: No free fluid in the pelvis. There are multifocal periumbilical fat containing abdominal wall hernias in the midline. Musculoskeletal: Degenerative changes are present in the thoracolumbar spine. Sclerosis is present at the sacroiliac joints bilaterally, compatible with sacroiliitis.  Anterior lumbar spinal fusion hardware and intervertebral disc spacer present at L5-S1. A disc  herniation is noted at L2-L3 with facet arthropathy resulting in moderate spinal canal and mild neural foraminal stenosis bilaterally. There is a disc herniation at L3-L4 with facet arthropathy resulting in moderate spinal canal and right neural foraminal stenosis. There is severe neural foraminal stenosis on the left. IMPRESSION: 1. No acute intra-abdominal process. 2. Left renal calculus. 3. Multilevel degenerative changes in the lumbar spine as detailed above. 4. Nonspecific prominent lymph nodes in the retroperitoneum, unchanged from 2018. Electronically Signed   By: Brett Fairy M.D.   On: 02/17/2021 03:54    Procedures Procedures   Medications Ordered in ED Medications  oxyCODONE-acetaminophen (PERCOCET/ROXICET) 5-325 MG per tablet 1 tablet (1 tablet Oral Given 02/16/21 2031)  oxyCODONE-acetaminophen (PERCOCET/ROXICET) 5-325 MG per tablet 1 tablet (1 tablet Oral Given 02/17/21 0221)  iohexol (OMNIPAQUE) 300 MG/ML solution 100 mL (100 mLs Intravenous Contrast Given 02/17/21 0335)  sodium chloride 0.9 % bolus 500 mL (0 mLs Intravenous Stopped 02/17/21 1010)  ondansetron (ZOFRAN) injection 4 mg (4 mg Intravenous Given 02/17/21 0933)  morphine 4 MG/ML injection 4 mg (4 mg Intravenous Given 02/17/21 0933)    ED Course  I have reviewed the triage vital signs and the nursing notes.  Pertinent labs & imaging results that were available during my care of the patient were reviewed by me and considered in my medical decision making (see chart for details).    MDM Rules/Calculators/A&P                           55 year old lady presents to ER with concern for lower abdominal pain with associated nausea and vomiting.  On exam she appears well in no distress.  Pain is currently mild.  CT scan negative for acute abdominal pathology.  Basic lab work grossly stable.  Normal renal function.  Slight hypokalemia.  Provided some symptomatic control with antiemetic, fluids and pain medicine.  Suspect most likely  viral in nature.  Given work-up, current appearance, believe she is appropriate for discharge and management in the outpatient setting.  Recommend recheck with primary doctor.     After the discussed management above, the patient was determined to be safe for discharge.  The patient was in agreement with this plan and all questions regarding their care were answered.  ED return precautions were discussed and the patient will return to the ED with any significant worsening of condition.    Final Clinical Impression(s) / ED Diagnoses Final diagnoses:  Generalized abdominal pain  Nausea and vomiting, unspecified vomiting type    Rx / DC Orders ED Discharge Orders          Ordered    ondansetron (ZOFRAN-ODT) 4 MG disintegrating tablet  Every 8 hours PRN        02/17/21 1014             Lucrezia Starch, MD 02/17/21 1630

## 2021-02-17 NOTE — Discharge Instructions (Signed)
Follow-up with primary doctor in the next couple days for recheck.  Drink plenty of fluids.  Come back to ER if you develop uncontrolled pain, vomiting, or other new concerning symptom.

## 2021-02-18 MED ORDER — FLUTICASONE FUROATE-VILANTEROL 200-25 MCG/ACT IN AEPB: 1.0000 | INHALATION_SPRAY | Freq: Every day | RESPIRATORY_TRACT | 3 refills | Status: DC

## 2021-02-19 LAB — URINE CULTURE: Culture: <10000 — AB

## 2021-02-25 DIAGNOSIS — M47817 Spondylosis without myelopathy or radiculopathy, lumbosacral region: Secondary | ICD-10-CM | POA: Diagnosis not present

## 2021-02-25 DIAGNOSIS — M47812 Spondylosis without myelopathy or radiculopathy, cervical region: Secondary | ICD-10-CM | POA: Diagnosis not present

## 2021-02-25 DIAGNOSIS — M961 Postlaminectomy syndrome, not elsewhere classified: Secondary | ICD-10-CM | POA: Diagnosis not present

## 2021-02-25 DIAGNOSIS — G894 Chronic pain syndrome: Secondary | ICD-10-CM | POA: Diagnosis not present

## 2021-03-25 ENCOUNTER — Ambulatory Visit
Admission: RE | Admit: 2021-03-25 | Discharge: 2021-03-25 | Disposition: A | Discharge status: Home / Self Care | Payer: BC Managed Care – PPO | Source: Ambulatory Visit | Attending: Family Medicine | Admitting: Family Medicine

## 2021-03-25 ENCOUNTER — Other Ambulatory Visit: Payer: Self-pay | Admitting: Family Medicine

## 2021-03-25 DIAGNOSIS — R928 Other abnormal and inconclusive findings on diagnostic imaging of breast: Secondary | ICD-10-CM | POA: Diagnosis not present

## 2021-03-25 DIAGNOSIS — N6320 Unspecified lump in the left breast, unspecified quadrant: Secondary | ICD-10-CM

## 2021-03-25 DIAGNOSIS — N644 Mastodynia: Secondary | ICD-10-CM | POA: Diagnosis not present

## 2021-03-30 DIAGNOSIS — E039 Hypothyroidism, unspecified: Secondary | ICD-10-CM | POA: Diagnosis not present

## 2021-04-14 ENCOUNTER — Encounter: Payer: Self-pay | Admitting: Internal Medicine

## 2021-04-14 ENCOUNTER — Ambulatory Visit (INDEPENDENT_AMBULATORY_CARE_PROVIDER_SITE_OTHER): Payer: BC Managed Care – PPO | Admitting: Internal Medicine

## 2021-04-14 ENCOUNTER — Other Ambulatory Visit: Payer: Self-pay

## 2021-04-14 VITALS — BP 124/80 | HR 87 | Temp 98.1°F | Ht 64.0 in | Wt 215.4 lb

## 2021-04-14 DIAGNOSIS — R0602 Shortness of breath: Secondary | ICD-10-CM

## 2021-04-14 DIAGNOSIS — U071 COVID-19: Secondary | ICD-10-CM

## 2021-04-14 DIAGNOSIS — J4530 Mild persistent asthma, uncomplicated: Secondary | ICD-10-CM

## 2021-04-14 LAB — PULMONARY FUNCTION TEST
DL/VA % pred: 112 %
DL/VA: 4.8 ml/min/mmHg/L
DLCO cor % pred: 112 %
DLCO cor: 23.16 ml/min/mmHg
DLCO unc % pred: 109 %
DLCO unc: 22.49 ml/min/mmHg
FEF 25-75 Post: 2.81 L/sec
FEF 25-75 Pre: 3.24 L/sec
FEF2575-%Change-Post: -13 %
FEF2575-%Pred-Post: 109 %
FEF2575-%Pred-Pre: 126 %
FEV1-%Change-Post: -3 %
FEV1-%Pred-Post: 96 %
FEV1-%Pred-Pre: 100 %
FEV1-Post: 2.59 L
FEV1-Pre: 2.69 L
FEV1FVC-%Change-Post: -1 %
FEV1FVC-%Pred-Pre: 108 %
FEV6-%Change-Post: 0 %
FEV6-%Pred-Post: 92 %
FEV6-%Pred-Pre: 92 %
FEV6-Post: 3.07 L
FEV6-Pre: 3.09 L
FEV6FVC-%Change-Post: 0 %
FEV6FVC-%Pred-Post: 102 %
FEV6FVC-%Pred-Pre: 103 %
FVC-%Change-Post: -1 %
FVC-%Pred-Post: 89 %
FVC-%Pred-Pre: 91 %
FVC-Post: 3.08 L
FVC-Pre: 3.14 L
Post FEV1/FVC ratio: 84 %
Post FEV6/FVC ratio: 100 %
Pre FEV1/FVC ratio: 86 %
Pre FEV6/FVC Ratio: 100 %
RV % pred: 101 %
RV: 1.91 L
TLC % pred: 103 %
TLC: 5.21 L

## 2021-04-14 NOTE — Progress Notes (Signed)
[name]    [id number]    23-Nov-1965  Primary Care Physician:McNeill, Toniann Fail, MD Date of Appointment: 04/14/2021 Established Patient Visit  Chief complaint:   Chief Complaint  Patient presents with   Follow-up    PFT review, SOB with activity     HPI: [name] is a 56 y.o. woman who presented with dyspnea on exertion and cough following covid infection.   Interval Updates: Here for follow up after PFTs and a trial of Breo inhaler. She notes an improvement in her breathing since starting this. No thrush.   She is using albuterol now less - about once a day.  Has lost 7 lbs intentionally through diet and exercise. Uses a recumbent bike.   I have reviewed the patient's family social and past medical history and updated as appropriate.   Past Medical History:  Diagnosis Date   Anemia    Anxiety    Bronchitis    hx of    Cervical pain (neck)    Chronic pancreatitis (HCC)    Closed fracture of base of fifth metatarsal bone of right foot at metaphyseal-diaphyseal junction    Closed fracture of left lateral malleolus 12/29/2017   saw [facility]iatry at [facility] and was  prescribed bone growth stimulator device to use before she has knee surgery    Constipation    DDD (degenerative disc disease), cervical    Depression    GERD (gastroesophageal reflux disease)    History of kidney stones    History of pancreatitis 01-04-2011   Hypothyroidism    IBS (irritable bowel syndrome)    Nausea DUE TO PAIN FROM KIDNEY STONE   Pancreatic pseudocyst    Pneumonia    hx of pneumonia    PONV (postoperative nausea and vomiting)    Right flank pain    Right knee pain    Right ureteral stone    Swelling of right knee joint    Urinary tract infection    hx of     Past Surgical History:  Procedure Laterality Date   ABDOMINAL HYSTERECTOMY     ANAL RECTAL MANOMETRY N/A 09/04/2016   Procedure: ANO RECTAL MANOMETRY;  Surgeon: Willis Modena, MD;   [facility];  Service: Endoscopy;  Laterality: N/A;   ANTERIOR LUMBAR FUSION  11-09-2010    L5 - S1   BACK SURGERY  2012   lower back surgery    CERVICAL FUSION  1999   C4 - 7   CERVICAL SPINE SURGERY  APRIL 2011   REMOVAL SCAR TISSUE   colonscopy      CYSTO/ RIGHT RETROGRADE PYELOGRAM/ RIGHT URETEROSCOPY AND STENT PLACEMENT  09-24-2009   HX RIGHT URETERAL CALCULUS   CYSTOSCOPY WITH URETEROSCOPY  12/20/2011   Procedure: CYSTOSCOPY WITH URETEROSCOPY;  Surgeon: Milford Cage, MD;  Location: [facility];  Service: Urology;  Laterality: Right;   DIAGNOSTIC LAPAROSCOPY  1992   ENDOMETRIOSIS   EUS  03/01/2011   Procedure: UPPER ENDOSCOPIC ULTRASOUND (EUS) LINEAR;  Surgeon: Freddy Jaksch, MD;  [facility];  Service: Endoscopy;  Laterality: N/A;   FINE NEEDLE ASPIRATION  03/01/2011   Procedure: FINE NEEDLE ASPIRATION (FNA) LINEAR;  Surgeon: Freddy Jaksch, MD;  [facility];  Service: Endoscopy;  Laterality: N/A;   HERNIA REPAIR  AS CHILD   JOINT REPLACEMENT     KNEE ARTHROSCOPY  AUG 2013   RIGHT KNEE   LAPAROSCOPIC ASSISTED VAGINAL HYSTERECTOMY  1997   LAPAROSCOPIC CHOLECYSTECTOMY  01-09-2011   ROBOTIC-ASSISTED RIGHT SALPINGO-OOPHORECTOMY W/ LYSIS ADHESIONS  12-21-2009   HX ENDOMETRIOSIS AND PELVIC PAIN   TONSILLECTOMY  AS CHILD   TOTAL KNEE ARTHROPLASTY Right 01/13/2014   Procedure: RIGHT TOTAL KNEE ARTHROPLASTY;  Surgeon: Shelda Pal, MD;  [facility];  Service: Orthopedics;  Laterality: Right;   TOTAL KNEE ARTHROPLASTY Left 12/13/2015   Procedure: LEFT TOTAL KNEE ARTHROPLASTY;  Surgeon: Ollen Gross, MD;  [facility];  Service: Orthopedics;  Laterality: Left;   TOTAL KNEE REVISION Right 08/31/2014   Procedure: RIGHT TOTAL KNEE ARTHROPLASTY RESECTION;  Surgeon: Durene Romans, MD;  [facility];  Service: Orthopedics;  Laterality: Right;   TOTAL KNEE REVISION Right 01/09/2018   Procedure: RIGHT TOTAL KNEE REVISION;   Surgeon: Ollen Gross, MD;  [facility];  Service: Orthopedics;  Laterality: Right;  Adductor Block   URETEROSCOPIC STONE EXTRACTIONS     X3    Family History  Problem Relation Age of Onset   Constipation Mother    Cancer Father    Fibromyalgia Sister     Social History   Occupational History   Occupation: Unemployed  Tobacco Use   Smoking status: Never   Smokeless tobacco: Never  Substance and Sexual Activity   Alcohol use: No   Drug use: No   Sexual activity: Never     Physical Exam: Blood pressure 124/80, pulse 87, temperature 98.1 F (36.7 C), temperature source Oral, height 5\' 4"  (1.626 m), weight 215 lb 6.4 oz (97.7 kg), SpO2 98 %.  Gen:      No acute distress ENT:  no nasal polyps, mucus membranes moist Lungs:    No increased respiratory effort, symmetric chest wall excursion, clear to auscultation bilaterally, no wheezes or crackles CV:         Regular rate and rhythm; no murmurs, rubs, or gallops.  No pedal edema   Data Reviewed: Imaging: I have personally reviewed the chest xray October 2022 - no acute cardiopulmonary process.   PFTs:  PFT Results Latest Ref Rng & Units 04/14/2021  FVC-Pre L 3.14  FVC-Predicted Pre % 91  FVC-Post L 3.08  FVC-Predicted Post % 89  Pre FEV1/FVC % % 86  Post FEV1/FCV % % 84  FEV1-Pre L 2.69  FEV1-Predicted Pre % 100  FEV1-Post L 2.59  DLCO uncorrected ml/min/mmHg 22.49  DLCO UNC% % 109  DLCO corrected ml/min/mmHg 23.16  DLCO COR %Predicted % 112  DLVA Predicted % 112  TLC L 5.21  TLC % Predicted % 103  RV % Predicted % 101   I have personally reviewed the patient's PFTs and show normal pulmonary function.   Labs:  Immunization status: Immunization History  Administered Date(s) Administered   Influenza Inj Mdck Quad Pf 04/05/2017   Influenza Split 04/05/2017, 01/13/2020   Influenza,inj,Quad PF,6+ Mos 03/27/2019   Influenza,inj,Quad PF,6-35 Mos 12/25/2017   Influenza,inj,quad, With Preservative  11/17/2015   Janssen (J&J) SARS-COV-2 Vaccination 06/12/2019, 01/13/2020   Tdap 10/14/2010, 10/28/2020    External Records Personally Reviewed:   Assessment:  Shortness of breath Chronic Rhinitis GERD   Plan/Recommendations: Continue Breo 1 puff once a day. Gargle after use.  Continue prn albuterol.  Suspect some of this is related to long covid symptoms, probable mild persistent asthma following this. Continue astelin, prilosec  Return to Care: Return in about 3 months (around 07/12/2021).   09/11/2021, MD Pulmonary and Critical Care Medicine [facility] Office:[phone]

## 2021-04-14 NOTE — Patient Instructions (Signed)
Please schedule follow up scheduled with myself in 3 months.  If my schedule is not open yet, we will contact you with a reminder closer to that time. Please call [phone] if you haven't heard from [place] a month before.   Keep taking Breo 1 puff once a day, gargle after use.  Continue albuterol as needed.

## 2021-04-14 NOTE — Progress Notes (Signed)
PFT done today. 

## 2021-04-26 DIAGNOSIS — M47817 Spondylosis without myelopathy or radiculopathy, lumbosacral region: Secondary | ICD-10-CM | POA: Diagnosis not present

## 2021-04-26 DIAGNOSIS — G894 Chronic pain syndrome: Secondary | ICD-10-CM | POA: Diagnosis not present

## 2021-04-26 DIAGNOSIS — M961 Postlaminectomy syndrome, not elsewhere classified: Secondary | ICD-10-CM | POA: Diagnosis not present

## 2021-04-26 DIAGNOSIS — M47812 Spondylosis without myelopathy or radiculopathy, cervical region: Secondary | ICD-10-CM | POA: Diagnosis not present

## 2021-05-18 ENCOUNTER — Other Ambulatory Visit: Payer: BC Managed Care – PPO

## 2021-06-08 DIAGNOSIS — K589 Irritable bowel syndrome without diarrhea: Secondary | ICD-10-CM | POA: Diagnosis not present

## 2021-06-08 DIAGNOSIS — K5900 Constipation, unspecified: Secondary | ICD-10-CM | POA: Diagnosis not present

## 2021-06-08 DIAGNOSIS — R109 Unspecified abdominal pain: Secondary | ICD-10-CM | POA: Diagnosis not present

## 2021-06-08 DIAGNOSIS — K861 Other chronic pancreatitis: Secondary | ICD-10-CM | POA: Diagnosis not present

## 2021-06-21 DIAGNOSIS — Z87442 Personal history of urinary calculi: Secondary | ICD-10-CM | POA: Diagnosis not present

## 2021-06-22 DIAGNOSIS — M47817 Spondylosis without myelopathy or radiculopathy, lumbosacral region: Secondary | ICD-10-CM | POA: Diagnosis not present

## 2021-06-22 DIAGNOSIS — G894 Chronic pain syndrome: Secondary | ICD-10-CM | POA: Diagnosis not present

## 2021-06-22 DIAGNOSIS — M47812 Spondylosis without myelopathy or radiculopathy, cervical region: Secondary | ICD-10-CM | POA: Diagnosis not present

## 2021-06-22 DIAGNOSIS — M961 Postlaminectomy syndrome, not elsewhere classified: Secondary | ICD-10-CM | POA: Diagnosis not present

## 2021-07-12 ENCOUNTER — Ambulatory Visit: Payer: BC Managed Care – PPO | Admitting: Internal Medicine

## 2021-08-23 DIAGNOSIS — M5459 Other low back pain: Secondary | ICD-10-CM | POA: Diagnosis not present

## 2021-08-23 DIAGNOSIS — M5451 Vertebrogenic low back pain: Secondary | ICD-10-CM | POA: Diagnosis not present

## 2021-08-30 DIAGNOSIS — Z79891 Long term (current) use of opiate analgesic: Secondary | ICD-10-CM | POA: Diagnosis not present

## 2021-08-30 DIAGNOSIS — M47812 Spondylosis without myelopathy or radiculopathy, cervical region: Secondary | ICD-10-CM | POA: Diagnosis not present

## 2021-08-30 DIAGNOSIS — M961 Postlaminectomy syndrome, not elsewhere classified: Secondary | ICD-10-CM | POA: Diagnosis not present

## 2021-08-30 DIAGNOSIS — M47817 Spondylosis without myelopathy or radiculopathy, lumbosacral region: Secondary | ICD-10-CM | POA: Diagnosis not present

## 2021-08-30 DIAGNOSIS — G894 Chronic pain syndrome: Secondary | ICD-10-CM | POA: Diagnosis not present

## 2021-09-07 DIAGNOSIS — M5459 Other low back pain: Secondary | ICD-10-CM | POA: Diagnosis not present

## 2021-09-21 DIAGNOSIS — M5451 Vertebrogenic low back pain: Secondary | ICD-10-CM | POA: Diagnosis not present

## 2021-11-02 DIAGNOSIS — G894 Chronic pain syndrome: Secondary | ICD-10-CM | POA: Diagnosis not present

## 2021-11-02 DIAGNOSIS — M47812 Spondylosis without myelopathy or radiculopathy, cervical region: Secondary | ICD-10-CM | POA: Diagnosis not present

## 2021-11-02 DIAGNOSIS — M47817 Spondylosis without myelopathy or radiculopathy, lumbosacral region: Secondary | ICD-10-CM | POA: Diagnosis not present

## 2021-11-02 DIAGNOSIS — M961 Postlaminectomy syndrome, not elsewhere classified: Secondary | ICD-10-CM | POA: Diagnosis not present

## 2021-11-04 DIAGNOSIS — Z23 Encounter for immunization: Secondary | ICD-10-CM | POA: Diagnosis not present

## 2021-11-04 DIAGNOSIS — E039 Hypothyroidism, unspecified: Secondary | ICD-10-CM | POA: Diagnosis not present

## 2021-11-04 DIAGNOSIS — M85851 Other specified disorders of bone density and structure, right thigh: Secondary | ICD-10-CM | POA: Diagnosis not present

## 2021-11-04 DIAGNOSIS — E782 Mixed hyperlipidemia: Secondary | ICD-10-CM | POA: Diagnosis not present

## 2021-11-04 DIAGNOSIS — Z0000 Encounter for general adult medical examination without abnormal findings: Secondary | ICD-10-CM | POA: Diagnosis not present

## 2021-11-04 DIAGNOSIS — R12 Heartburn: Secondary | ICD-10-CM | POA: Diagnosis not present

## 2021-11-04 DIAGNOSIS — J302 Other seasonal allergic rhinitis: Secondary | ICD-10-CM | POA: Diagnosis not present

## 2021-11-07 ENCOUNTER — Other Ambulatory Visit: Payer: Self-pay | Admitting: Family Medicine

## 2021-11-07 DIAGNOSIS — M85851 Other specified disorders of bone density and structure, right thigh: Secondary | ICD-10-CM

## 2021-11-09 DIAGNOSIS — M48061 Spinal stenosis, lumbar region without neurogenic claudication: Secondary | ICD-10-CM | POA: Diagnosis not present

## 2021-12-29 DIAGNOSIS — G894 Chronic pain syndrome: Secondary | ICD-10-CM | POA: Diagnosis not present

## 2021-12-29 DIAGNOSIS — M961 Postlaminectomy syndrome, not elsewhere classified: Secondary | ICD-10-CM | POA: Diagnosis not present

## 2021-12-29 DIAGNOSIS — M47812 Spondylosis without myelopathy or radiculopathy, cervical region: Secondary | ICD-10-CM | POA: Diagnosis not present

## 2021-12-29 DIAGNOSIS — M47817 Spondylosis without myelopathy or radiculopathy, lumbosacral region: Secondary | ICD-10-CM | POA: Diagnosis not present

## 2022-02-21 DIAGNOSIS — M47812 Spondylosis without myelopathy or radiculopathy, cervical region: Secondary | ICD-10-CM | POA: Diagnosis not present

## 2022-02-21 DIAGNOSIS — G894 Chronic pain syndrome: Secondary | ICD-10-CM | POA: Diagnosis not present

## 2022-02-21 DIAGNOSIS — M47817 Spondylosis without myelopathy or radiculopathy, lumbosacral region: Secondary | ICD-10-CM | POA: Diagnosis not present

## 2022-02-21 DIAGNOSIS — M961 Postlaminectomy syndrome, not elsewhere classified: Secondary | ICD-10-CM | POA: Diagnosis not present

## 2022-04-24 DIAGNOSIS — M47817 Spondylosis without myelopathy or radiculopathy, lumbosacral region: Secondary | ICD-10-CM | POA: Diagnosis not present

## 2022-04-24 DIAGNOSIS — G894 Chronic pain syndrome: Secondary | ICD-10-CM | POA: Diagnosis not present

## 2022-04-24 DIAGNOSIS — M47812 Spondylosis without myelopathy or radiculopathy, cervical region: Secondary | ICD-10-CM | POA: Diagnosis not present

## 2022-04-24 DIAGNOSIS — M961 Postlaminectomy syndrome, not elsewhere classified: Secondary | ICD-10-CM | POA: Diagnosis not present

## 2022-05-02 ENCOUNTER — Ambulatory Visit
Admission: RE | Admit: 2022-05-02 | Discharge: 2022-05-02 | Disposition: A | Discharge status: Home / Self Care | Payer: BC Managed Care – PPO | Source: Ambulatory Visit | Attending: Family Medicine | Admitting: Family Medicine

## 2022-05-02 DIAGNOSIS — M85851 Other specified disorders of bone density and structure, right thigh: Secondary | ICD-10-CM | POA: Diagnosis not present

## 2022-05-02 DIAGNOSIS — Z780 Asymptomatic menopausal state: Secondary | ICD-10-CM | POA: Diagnosis not present

## 2022-06-21 DIAGNOSIS — M961 Postlaminectomy syndrome, not elsewhere classified: Secondary | ICD-10-CM | POA: Diagnosis not present

## 2022-06-21 DIAGNOSIS — M47812 Spondylosis without myelopathy or radiculopathy, cervical region: Secondary | ICD-10-CM | POA: Diagnosis not present

## 2022-06-21 DIAGNOSIS — M47817 Spondylosis without myelopathy or radiculopathy, lumbosacral region: Secondary | ICD-10-CM | POA: Diagnosis not present

## 2022-06-21 DIAGNOSIS — G894 Chronic pain syndrome: Secondary | ICD-10-CM | POA: Diagnosis not present

## 2022-07-20 DIAGNOSIS — S199XXA Unspecified injury of neck, initial encounter: Secondary | ICD-10-CM | POA: Diagnosis not present

## 2022-07-20 DIAGNOSIS — M47816 Spondylosis without myelopathy or radiculopathy, lumbar region: Secondary | ICD-10-CM | POA: Diagnosis not present

## 2022-07-20 DIAGNOSIS — Y92413 State road as the place of occurrence of the external cause: Secondary | ICD-10-CM | POA: Diagnosis not present

## 2022-07-20 DIAGNOSIS — Z041 Encounter for examination and observation following transport accident: Secondary | ICD-10-CM | POA: Diagnosis not present

## 2022-07-20 DIAGNOSIS — Z888 Allergy status to other drugs, medicaments and biological substances status: Secondary | ICD-10-CM | POA: Diagnosis not present

## 2022-07-20 DIAGNOSIS — G8929 Other chronic pain: Secondary | ICD-10-CM | POA: Diagnosis not present

## 2022-07-20 DIAGNOSIS — I959 Hypotension, unspecified: Secondary | ICD-10-CM | POA: Diagnosis not present

## 2022-07-20 DIAGNOSIS — S161XXA Strain of muscle, fascia and tendon at neck level, initial encounter: Secondary | ICD-10-CM | POA: Diagnosis not present

## 2022-07-20 DIAGNOSIS — M5489 Other dorsalgia: Secondary | ICD-10-CM | POA: Diagnosis not present

## 2022-07-20 DIAGNOSIS — M542 Cervicalgia: Secondary | ICD-10-CM | POA: Diagnosis not present

## 2022-07-20 DIAGNOSIS — S3992XA Unspecified injury of lower back, initial encounter: Secondary | ICD-10-CM | POA: Diagnosis not present

## 2022-07-20 DIAGNOSIS — S0990XA Unspecified injury of head, initial encounter: Secondary | ICD-10-CM | POA: Diagnosis not present

## 2022-07-20 DIAGNOSIS — R519 Headache, unspecified: Secondary | ICD-10-CM | POA: Diagnosis not present

## 2022-07-20 DIAGNOSIS — S39012A Strain of muscle, fascia and tendon of lower back, initial encounter: Secondary | ICD-10-CM | POA: Diagnosis not present

## 2022-08-01 DIAGNOSIS — M5136 Other intervertebral disc degeneration, lumbar region: Secondary | ICD-10-CM | POA: Diagnosis not present

## 2022-08-01 DIAGNOSIS — M48061 Spinal stenosis, lumbar region without neurogenic claudication: Secondary | ICD-10-CM | POA: Diagnosis not present

## 2022-08-04 ENCOUNTER — Other Ambulatory Visit: Payer: Self-pay | Admitting: Neurosurgery

## 2022-08-04 DIAGNOSIS — M48061 Spinal stenosis, lumbar region without neurogenic claudication: Secondary | ICD-10-CM

## 2022-08-22 ENCOUNTER — Other Ambulatory Visit: Payer: BC Managed Care – PPO

## 2022-08-22 DIAGNOSIS — M47817 Spondylosis without myelopathy or radiculopathy, lumbosacral region: Secondary | ICD-10-CM | POA: Diagnosis not present

## 2022-08-22 DIAGNOSIS — M961 Postlaminectomy syndrome, not elsewhere classified: Secondary | ICD-10-CM | POA: Diagnosis not present

## 2022-08-22 DIAGNOSIS — G894 Chronic pain syndrome: Secondary | ICD-10-CM | POA: Diagnosis not present

## 2022-08-22 DIAGNOSIS — Z79891 Long term (current) use of opiate analgesic: Secondary | ICD-10-CM | POA: Diagnosis not present

## 2022-08-22 DIAGNOSIS — M47812 Spondylosis without myelopathy or radiculopathy, cervical region: Secondary | ICD-10-CM | POA: Diagnosis not present

## 2022-08-24 ENCOUNTER — Ambulatory Visit
Admission: RE | Admit: 2022-08-24 | Discharge: 2022-08-24 | Disposition: A | Discharge status: Home / Self Care | Payer: BC Managed Care – PPO | Source: Ambulatory Visit | Attending: Neurosurgery | Admitting: Neurosurgery

## 2022-08-24 DIAGNOSIS — M79605 Pain in left leg: Secondary | ICD-10-CM | POA: Diagnosis not present

## 2022-08-24 DIAGNOSIS — M48061 Spinal stenosis, lumbar region without neurogenic claudication: Secondary | ICD-10-CM

## 2022-08-24 DIAGNOSIS — M79604 Pain in right leg: Secondary | ICD-10-CM | POA: Diagnosis not present

## 2022-08-24 DIAGNOSIS — M9963 Osseous and subluxation stenosis of intervertebral foramina of lumbar region: Secondary | ICD-10-CM | POA: Diagnosis not present

## 2022-08-24 DIAGNOSIS — M545 Low back pain, unspecified: Secondary | ICD-10-CM | POA: Diagnosis not present

## 2022-08-24 MED ORDER — IOPAMIDOL (ISOVUE-M 200) INJECTION 41%
20.0000 mL | Freq: Once | INTRAMUSCULAR | Status: AC
  Administered 2022-08-24: 20 mL via INTRATHECAL

## 2022-08-24 MED ORDER — ONDANSETRON HCL 4 MG/2ML IJ SOLN: 4.0000 mg | Freq: Once | INTRAMUSCULAR | Status: DC | PRN

## 2022-08-24 MED ORDER — MEPERIDINE HCL 50 MG/ML IJ SOLN: 50.0000 mg | Freq: Once | INTRAMUSCULAR | Status: DC | PRN

## 2022-08-24 MED ORDER — ONDANSETRON HCL 4 MG PO TABS
4.0000 mg | ORAL_TABLET | Freq: Once | ORAL | Status: AC
  Administered 2022-08-24: 4 mg via ORAL

## 2022-08-24 MED ORDER — DIAZEPAM 5 MG PO TABS
10.0000 mg | ORAL_TABLET | Freq: Once | ORAL | Status: AC
  Administered 2022-08-24: 5 mg via ORAL

## 2022-08-24 NOTE — Discharge Instr - Other Info (Signed)
1055- pt reports nausea post myelogram procedure. MD Mosetta Putt notified. See [facility]

## 2022-08-24 NOTE — Discharge Instructions (Signed)

## 2022-08-31 DIAGNOSIS — M5136 Other intervertebral disc degeneration, lumbar region: Secondary | ICD-10-CM | POA: Diagnosis not present

## 2022-08-31 DIAGNOSIS — M48061 Spinal stenosis, lumbar region without neurogenic claudication: Secondary | ICD-10-CM | POA: Diagnosis not present

## 2022-09-27 ENCOUNTER — Other Ambulatory Visit: Payer: Self-pay | Admitting: Neurosurgery

## 2022-10-04 ENCOUNTER — Other Ambulatory Visit: Payer: Self-pay | Admitting: Neurosurgery

## 2022-10-17 DIAGNOSIS — M47812 Spondylosis without myelopathy or radiculopathy, cervical region: Secondary | ICD-10-CM | POA: Diagnosis not present

## 2022-10-17 DIAGNOSIS — M961 Postlaminectomy syndrome, not elsewhere classified: Secondary | ICD-10-CM | POA: Diagnosis not present

## 2022-10-17 DIAGNOSIS — G894 Chronic pain syndrome: Secondary | ICD-10-CM | POA: Diagnosis not present

## 2022-10-17 DIAGNOSIS — M47817 Spondylosis without myelopathy or radiculopathy, lumbosacral region: Secondary | ICD-10-CM | POA: Diagnosis not present

## 2022-10-18 NOTE — Pre-Procedure Instructions (Signed)
Surgical Instructions   Your procedure is scheduled on October 26, 2022. Report to [facility] Main Entrance "A" at 5:30 A.M., then check in with the Admitting office. Any questions or running late day of surgery: call [phone]  Questions prior to your surgery date: call [phone], Monday-Friday, 8am-4pm. If you experience any cold or flu symptoms such as cough, fever, chills, shortness of breath, etc. between now and your scheduled surgery, please notify us at the above number.     Remember:  Do not eat or drink after midnight the night before your surgery     Take these medicines the morning of surgery with A SIP OF WATER: ALPRAZolam (XANAX)  buPROPion (WELLBUTRIN XL)  cetirizine (ZYRTEC)  DULoxetine (CYMBALTA)  levothyroxine (SYNTHROID)  methocarbamol (ROBAXIN)  NUCYNTA  oxyCODONE (OXY IR/ROXICODONE)  pentosan polysulfate (ELMIRON)  pregabalin (LYRICA)  tamsulosin [facility])    May take these medicines IF NEEDED: albuterol (PROVENTIL HFA;VENTOLIN HFA) inhaler  benzonatate (TESSALON)  docusate sodium (COLACE)  ipratropium-albuterol (DUONEB) nebulizer omeprazole (PRILOSEC)  promethazine (PHENERGAN)    One week prior to surgery, STOP taking any Aspirin (unless otherwise instructed by your surgeon) Aleve, Naproxen, Ibuprofen, Motrin, Advil, Goody's, BC's, all herbal medications, fish oil, and non-prescription vitamins. This includes your medication: diclofenac sodium (VOLTAREN) GEL                      Do NOT Smoke (Tobacco/Vaping) for 24 hours prior to your procedure.  If you use a CPAP at night, you may bring your mask/headgear for your overnight stay.   You will be asked to remove any contacts, glasses, piercing's, hearing aid's, dentures/partials prior to surgery. Please bring cases for these items if needed.    Patients discharged the day of surgery will not be allowed to drive home, and someone needs to stay with them for 24 hours.  SURGICAL WAITING ROOM  VISITATION Patients may have no more than 2 support people in the waiting area - these visitors may rotate.   Pre-op nurse will coordinate an appropriate time for 1 ADULT support person, who may not rotate, to accompany patient in pre-op.  Children under the age of 70 must have an adult with them who is not the patient and must remain in the main waiting area with an adult.  If the patient needs to stay at the hospital during part of their recovery, the visitor guidelines for inpatient rooms apply.  Please refer to the [facility] website for the visitor guidelines for any additional information.   If you received a COVID test during your pre-op visit  it is requested that you wear a mask when out in public, stay away from anyone that may not be feeling well and notify your surgeon if you develop symptoms. If you have been in contact with anyone that has tested positive in the last 10 days please notify you surgeon.      Pre-operative 5 CHG Bathing Instructions   You can play a key role in reducing the risk of infection after surgery. Your skin needs to be as free of germs as possible. You can reduce the number of germs on your skin by washing with CHG (chlorhexidine gluconate) soap before surgery. CHG is an antiseptic soap that kills germs and continues to kill germs even after washing.   DO NOT use if you have an allergy to chlorhexidine/CHG or antibacterial soaps. If your skin becomes reddened or irritated, stop using the CHG and notify one of  our RNs at [phone].   Please shower with the CHG soap starting 4 days before surgery using the following schedule:     Please keep in mind the following:  DO NOT shave, including legs and underarms, starting the day of your first shower.   You may shave your face at any point before/day of surgery.  Place clean sheets on your bed the day you start using CHG soap. Use a clean washcloth (not used since being washed) for each shower. DO NOT  sleep with pets once you start using the CHG.   CHG Shower Instructions:  If you choose to wash your hair and private area, wash first with your normal shampoo/soap.  After you use shampoo/soap, rinse your hair and body thoroughly to remove shampoo/soap residue.  Turn the water OFF and apply about 3 tablespoons (45 ml) of CHG soap to a CLEAN washcloth.  Apply CHG soap ONLY FROM YOUR NECK DOWN TO YOUR TOES (washing for 3-5 minutes)  DO NOT use CHG soap on face, private areas, open wounds, or sores.  Pay special attention to the area where your surgery is being performed.  If you are having back surgery, having someone wash your back for you may be helpful. Wait 2 minutes after CHG soap is applied, then you may rinse off the CHG soap.  Pat dry with a clean towel  Put on clean clothes/pajamas   If you choose to wear lotion, please use ONLY the CHG-compatible lotions on the back of this paper.   Additional instructions for the day of surgery: DO NOT APPLY any lotions, deodorants, cologne, or perfumes.   Do not bring valuables to the hospital. [facility] is not responsible for any belongings/valuables. Do not wear nail polish, gel polish, artificial nails, or any other type of covering on natural nails (fingers and toes) Do not wear jewelry or makeup Put on clean/comfortable clothes.  Please brush your teeth.  Ask your nurse before applying any prescription medications to the skin.     CHG Compatible Lotions   Aveeno Moisturizing lotion  Cetaphil Moisturizing Cream  Cetaphil Moisturizing Lotion  Clairol Herbal Essence Moisturizing Lotion, Dry Skin  Clairol Herbal Essence Moisturizing Lotion, Extra Dry Skin  Clairol Herbal Essence Moisturizing Lotion, Normal Skin  Curel Age Defying Therapeutic Moisturizing Lotion with Alpha Hydroxy  Curel Extreme Care Body Lotion  Curel Soothing Hands Moisturizing Hand Lotion  Curel Therapeutic Moisturizing Cream, Fragrance-Free  Curel Therapeutic  Moisturizing Lotion, Fragrance-Free  Curel Therapeutic Moisturizing Lotion, Original Formula  Eucerin Daily Replenishing Lotion  Eucerin Dry Skin Therapy Plus Alpha Hydroxy Crme  Eucerin Dry Skin Therapy Plus Alpha Hydroxy Lotion  Eucerin Original Crme  Eucerin Original Lotion  Eucerin Plus Crme Eucerin Plus Lotion  Eucerin TriLipid Replenishing Lotion  Keri Anti-Bacterial Hand Lotion  Keri Deep Conditioning Original Lotion Dry Skin Formula Softly Scented  Keri Deep Conditioning Original Lotion, Fragrance Free Sensitive Skin Formula  Keri Lotion Fast Absorbing Fragrance Free Sensitive Skin Formula  Keri Lotion Fast Absorbing Softly Scented Dry Skin Formula  Keri Original Lotion  Keri Skin Renewal Lotion Keri Silky Smooth Lotion  Keri Silky Smooth Sensitive Skin Lotion  Nivea Body Creamy Conditioning Oil  Nivea Body Extra Enriched Teacher, adult education Moisturizing Lotion Nivea Crme  Nivea Skin Firming Lotion  NutraDerm 30 Skin Lotion  NutraDerm Skin Lotion  NutraDerm Therapeutic Skin Cream  NutraDerm Therapeutic Skin Lotion  ProShield Protective Hand Cream  Provon moisturizing lotion  Please read over the following fact sheets that you were given.

## 2022-10-19 ENCOUNTER — Encounter (HOSPITAL_COMMUNITY)
Admission: RE | Admit: 2022-10-19 | Discharge: 2022-10-19 | Disposition: A | Discharge status: Home / Self Care | Payer: BC Managed Care – PPO | Source: Ambulatory Visit | Attending: Neurosurgery | Admitting: Neurosurgery

## 2022-10-19 ENCOUNTER — Other Ambulatory Visit: Payer: Self-pay

## 2022-10-19 ENCOUNTER — Encounter (HOSPITAL_COMMUNITY): Payer: Self-pay

## 2022-10-19 VITALS — BP 116/68 | Temp 98.3°F | Resp 17 | Ht 64.5 in | Wt 180.3 lb

## 2022-10-19 DIAGNOSIS — Z01812 Encounter for preprocedural laboratory examination: Secondary | ICD-10-CM | POA: Insufficient documentation

## 2022-10-19 DIAGNOSIS — Z01818 Encounter for other preprocedural examination: Secondary | ICD-10-CM

## 2022-10-19 HISTORY — DX: Interstitial cystitis (chronic) without hematuria: N30.10

## 2022-10-19 LAB — COMPREHENSIVE METABOLIC PANEL
ALT: 15 U/L (ref 0–44)
AST: 19 U/L (ref 15–41)
Albumin: 3.5 g/dL (ref 3.5–5.0)
Alkaline Phosphatase: 51 U/L (ref 38–126)
Anion gap: 6 (ref 5–15)
BUN: 14 mg/dL (ref 6–20)
CO2: 32 mmol/L (ref 22–32)
Calcium: 8.8 mg/dL — ABNORMAL LOW (ref 8.9–10.3)
Chloride: 101 mmol/L (ref 98–111)
Creatinine, Ser: 0.88 mg/dL (ref 0.44–1.00)
GFR, Estimated: >60 mL/min (ref >60)
Glucose, Bld: 103 mg/dL — ABNORMAL HIGH (ref 70–99)
Potassium: 3.1 mmol/L — ABNORMAL LOW (ref 3.5–5.1)
Sodium: 139 mmol/L (ref 135–145)
Total Bilirubin: 0.5 mg/dL (ref 0.3–1.2)
Total Protein: 6 g/dL — ABNORMAL LOW (ref 6.5–8.1)

## 2022-10-19 LAB — SURGICAL PCR SCREEN
MRSA, PCR: NEGATIVE
Staphylococcus aureus: NEGATIVE

## 2022-10-19 LAB — CBC
HCT: 39.6 % (ref 36.0–46.0)
Hemoglobin: 12.4 g/dL (ref 12.0–15.0)
MCH: 26 pg (ref 26.0–34.0)
MCHC: 31.3 g/dL (ref 30.0–36.0)
MCV: 83 fL (ref 80.0–100.0)
Platelets: 245 10*3/uL (ref 150–400)
RBC: 4.77 MIL/uL (ref 3.87–5.11)
RDW: 13.2 % (ref 11.5–15.5)
WBC: 4.7 10*3/uL (ref 4.0–10.5)
nRBC: 0 % (ref 0.0–0.2)

## 2022-10-19 LAB — TYPE AND SCREEN
ABO/RH(D): B POS
Antibody Screen: NEGATIVE

## 2022-10-19 NOTE — Progress Notes (Signed)
PCP - Dr. Gweneth Dimitri Cardiologist - Denies  PPM/ICD - Denies Device Orders - n/a Rep Notified - n/a  Chest x-ray - Denies EKG - Denies Stress Test - Denies ECHO - Denies Cardiac Cath - Denies  Sleep Study - Denies CPAP - n/a  No DM  Last dose of GLP1 agonist- n/a   GLP1 instructions: n/a  Blood Thinner Instructions: n/a Aspirin Instructions: n/a  NPO after midnight  COVID TEST- n/a   Anesthesia review: No.   Patient denies shortness of breath, fever, cough and chest pain at PAT appointment. Pt denies any respiratory illness/infection in the last two months.   All instructions explained to the patient, with a verbal understanding of the material. Patient agrees to go over the instructions while at home for a better understanding. Patient also instructed to self quarantine after being tested for COVID-19. The opportunity to ask questions was provided.

## 2022-10-26 ENCOUNTER — Other Ambulatory Visit: Payer: Self-pay

## 2022-10-26 ENCOUNTER — Ambulatory Visit (HOSPITAL_COMMUNITY): Payer: BC Managed Care – PPO

## 2022-10-26 ENCOUNTER — Encounter (HOSPITAL_COMMUNITY)
Admission: RE | Disposition: A | Discharge status: Home / Self Care | Payer: Self-pay | Source: Home / Self Care | Attending: Neurosurgery

## 2022-10-26 ENCOUNTER — Encounter (HOSPITAL_COMMUNITY): Payer: Self-pay | Admitting: Neurosurgery

## 2022-10-26 ENCOUNTER — Ambulatory Visit (HOSPITAL_COMMUNITY): Payer: BC Managed Care – PPO | Admitting: Physician Assistant

## 2022-10-26 ENCOUNTER — Ambulatory Visit (HOSPITAL_COMMUNITY)
Admission: RE | Admit: 2022-10-26 | Discharge: 2022-10-27 | Disposition: A | Discharge status: Home / Self Care | Payer: BC Managed Care – PPO | Attending: Neurosurgery | Admitting: Neurosurgery

## 2022-10-26 DIAGNOSIS — Z9889 Other specified postprocedural states: Secondary | ICD-10-CM | POA: Diagnosis not present

## 2022-10-26 DIAGNOSIS — M4156 Other secondary scoliosis, lumbar region: Secondary | ICD-10-CM | POA: Insufficient documentation

## 2022-10-26 DIAGNOSIS — F419 Anxiety disorder, unspecified: Secondary | ICD-10-CM | POA: Diagnosis not present

## 2022-10-26 DIAGNOSIS — F32A Depression, unspecified: Secondary | ICD-10-CM | POA: Diagnosis not present

## 2022-10-26 DIAGNOSIS — M4801 Spinal stenosis, occipito-atlanto-axial region: Secondary | ICD-10-CM | POA: Diagnosis not present

## 2022-10-26 DIAGNOSIS — E039 Hypothyroidism, unspecified: Secondary | ICD-10-CM | POA: Diagnosis not present

## 2022-10-26 DIAGNOSIS — Z981 Arthrodesis status: Secondary | ICD-10-CM | POA: Insufficient documentation

## 2022-10-26 DIAGNOSIS — M5116 Intervertebral disc disorders with radiculopathy, lumbar region: Secondary | ICD-10-CM | POA: Insufficient documentation

## 2022-10-26 DIAGNOSIS — M48061 Spinal stenosis, lumbar region without neurogenic claudication: Secondary | ICD-10-CM | POA: Diagnosis not present

## 2022-10-26 DIAGNOSIS — M4186 Other forms of scoliosis, lumbar region: Secondary | ICD-10-CM | POA: Diagnosis not present

## 2022-10-26 DIAGNOSIS — M419 Scoliosis, unspecified: Secondary | ICD-10-CM | POA: Diagnosis present

## 2022-10-26 HISTORY — PX: ANTERIOR LAT LUMBAR FUSION: SHX1168

## 2022-10-26 SURGERY — ANTERIOR LATERAL LUMBAR FUSION 2 LEVELS
Anesthesia: General | Laterality: Left

## 2022-10-26 MED ORDER — BUPIVACAINE-EPINEPHRINE 0.5% -1:200000 IJ SOLN
INTRAMUSCULAR | Status: DC | PRN
  Administered 2022-10-26: 10 mL

## 2022-10-26 MED ORDER — MIDAZOLAM HCL 2 MG/2ML IJ SOLN
INTRAMUSCULAR | Status: AC
  Filled 2022-10-26: qty 2

## 2022-10-26 MED ORDER — ONDANSETRON HCL 4 MG/2ML IJ SOLN
INTRAMUSCULAR | Status: DC | PRN
  Administered 2022-10-26: 8 mg via INTRAVENOUS

## 2022-10-26 MED ORDER — ACETAMINOPHEN 500 MG PO TABS
1000.0000 mg | ORAL_TABLET | Freq: Four times a day (QID) | ORAL | Status: AC
  Administered 2022-10-26 – 2022-10-27 (×4): 1000 mg via ORAL
  Filled 2022-10-26 (×4): qty 2

## 2022-10-26 MED ORDER — CEFAZOLIN SODIUM-DEXTROSE 2-4 GM/100ML-% IV SOLN
2.0000 g | Freq: Three times a day (TID) | INTRAVENOUS | Status: AC
  Administered 2022-10-26 (×2): 2 g via INTRAVENOUS
  Filled 2022-10-26 (×2): qty 100

## 2022-10-26 MED ORDER — FENTANYL CITRATE (PF) 100 MCG/2ML IJ SOLN
INTRAMUSCULAR | Status: AC
  Filled 2022-10-26: qty 2

## 2022-10-26 MED ORDER — LACTATED RINGERS IV SOLN: INTRAVENOUS | Status: DC

## 2022-10-26 MED ORDER — METHOCARBAMOL 500 MG PO TABS
500.0000 mg | ORAL_TABLET | Freq: Four times a day (QID) | ORAL | Status: DC | PRN
  Administered 2022-10-26 – 2022-10-27 (×2): 500 mg via ORAL
  Filled 2022-10-26 (×2): qty 1

## 2022-10-26 MED ORDER — ACETAMINOPHEN 650 MG RE SUPP: 650.0000 mg | RECTAL | Status: DC | PRN

## 2022-10-26 MED ORDER — PHENYLEPHRINE HCL-NACL 20-0.9 MG/250ML-% IV SOLN
INTRAVENOUS | Status: DC | PRN
  Administered 2022-10-26: 30 ug/min via INTRAVENOUS

## 2022-10-26 MED ORDER — HYDROCHLOROTHIAZIDE 25 MG PO TABS
25.0000 mg | ORAL_TABLET | Freq: Every day | ORAL | Status: DC
  Administered 2022-10-26: 25 mg via ORAL
  Filled 2022-10-26: qty 1

## 2022-10-26 MED ORDER — SUCCINYLCHOLINE CHLORIDE 200 MG/10ML IV SOSY
PREFILLED_SYRINGE | INTRAVENOUS | Status: DC | PRN
  Administered 2022-10-26: 200 mg via INTRAVENOUS

## 2022-10-26 MED ORDER — ONDANSETRON HCL 4 MG/2ML IJ SOLN
INTRAMUSCULAR | Status: AC
  Filled 2022-10-26: qty 2

## 2022-10-26 MED ORDER — BISACODYL 10 MG RE SUPP: 10.0000 mg | Freq: Every day | RECTAL | Status: DC | PRN

## 2022-10-26 MED ORDER — DOCUSATE SODIUM 100 MG PO CAPS
100.0000 mg | ORAL_CAPSULE | Freq: Two times a day (BID) | ORAL | Status: DC
  Administered 2022-10-26 – 2022-10-27 (×3): 100 mg via ORAL
  Filled 2022-10-26 (×3): qty 1

## 2022-10-26 MED ORDER — SODIUM CHLORIDE 0.9% FLUSH: 3.0000 mL | INTRAVENOUS | Status: DC | PRN

## 2022-10-26 MED ORDER — PENTOSAN POLYSULFATE SODIUM 100 MG PO CAPS
100.0000 mg | ORAL_CAPSULE | Freq: Three times a day (TID) | ORAL | Status: DC
  Administered 2022-10-26 – 2022-10-27 (×3): 100 mg via ORAL
  Filled 2022-10-26 (×5): qty 1

## 2022-10-26 MED ORDER — BUPIVACAINE-EPINEPHRINE (PF) 0.5% -1:200000 IJ SOLN
INTRAMUSCULAR | Status: AC
  Filled 2022-10-26: qty 30

## 2022-10-26 MED ORDER — MORPHINE SULFATE (PF) 4 MG/ML IV SOLN
4.0000 mg | INTRAVENOUS | Status: DC | PRN
  Administered 2022-10-26 (×2): 4 mg via INTRAVENOUS
  Filled 2022-10-26 (×2): qty 1

## 2022-10-26 MED ORDER — SODIUM CHLORIDE 0.9 % IV SOLN: 250.0000 mL | INTRAVENOUS | Status: DC

## 2022-10-26 MED ORDER — GLYCOPYRROLATE PF 0.2 MG/ML IJ SOSY
PREFILLED_SYRINGE | INTRAMUSCULAR | Status: AC
  Filled 2022-10-26: qty 1

## 2022-10-26 MED ORDER — FENTANYL CITRATE (PF) 100 MCG/2ML IJ SOLN
25.0000 ug | INTRAMUSCULAR | Status: DC | PRN
  Administered 2022-10-26: 25 ug via INTRAVENOUS
  Administered 2022-10-26: 50 ug via INTRAVENOUS
  Administered 2022-10-26: 25 ug via INTRAVENOUS
  Administered 2022-10-26: 50 ug via INTRAVENOUS

## 2022-10-26 MED ORDER — SODIUM CHLORIDE 0.9% FLUSH
3.0000 mL | Freq: Two times a day (BID) | INTRAVENOUS | Status: DC
  Administered 2022-10-26: 3 mL via INTRAVENOUS

## 2022-10-26 MED ORDER — PROPOFOL 500 MG/50ML IV EMUL
INTRAVENOUS | Status: DC | PRN
  Administered 2022-10-26: 30 ug/kg/min via INTRAVENOUS

## 2022-10-26 MED ORDER — PROPOFOL 10 MG/ML IV BOLUS
INTRAVENOUS | Status: DC | PRN
Start: 2022-10-26 — End: 2022-10-26
  Administered 2022-10-26: 120 mg via INTRAVENOUS

## 2022-10-26 MED ORDER — METHOCARBAMOL 500 MG PO TABS
1000.0000 mg | ORAL_TABLET | Freq: Once | ORAL | Status: AC
  Administered 2022-10-26: 1000 mg via ORAL

## 2022-10-26 MED ORDER — PROPOFOL 10 MG/ML IV BOLUS
INTRAVENOUS | Status: AC
  Filled 2022-10-26: qty 20

## 2022-10-26 MED ORDER — ROCURONIUM BROMIDE 10 MG/ML (PF) SYRINGE
PREFILLED_SYRINGE | INTRAVENOUS | Status: AC
  Filled 2022-10-26: qty 10

## 2022-10-26 MED ORDER — TAPENTADOL HCL ER 150 MG PO TB12: 150.0000 mg | ORAL_TABLET | Freq: Two times a day (BID) | ORAL | Status: DC

## 2022-10-26 MED ORDER — BUPROPION HCL ER (XL) 300 MG PO TB24
300.0000 mg | ORAL_TABLET | Freq: Every morning | ORAL | Status: DC
  Administered 2022-10-27: 300 mg via ORAL
  Filled 2022-10-26: qty 1

## 2022-10-26 MED ORDER — 0.9 % SODIUM CHLORIDE (POUR BTL) OPTIME
TOPICAL | Status: DC | PRN
  Administered 2022-10-26: 1000 mL

## 2022-10-26 MED ORDER — DEXAMETHASONE SODIUM PHOSPHATE 10 MG/ML IJ SOLN
INTRAMUSCULAR | Status: AC
  Filled 2022-10-26: qty 1

## 2022-10-26 MED ORDER — CHLORHEXIDINE GLUCONATE CLOTH 2 % EX PADS: 6.0000 | MEDICATED_PAD | Freq: Once | CUTANEOUS | Status: DC

## 2022-10-26 MED ORDER — LORATADINE 10 MG PO TABS
10.0000 mg | ORAL_TABLET | Freq: Every day | ORAL | Status: DC
  Administered 2022-10-27: 10 mg via ORAL
  Filled 2022-10-26: qty 1

## 2022-10-26 MED ORDER — CHLORHEXIDINE GLUCONATE 0.12 % MT SOLN: 15.0000 mL | Freq: Once | OROMUCOSAL | Status: AC

## 2022-10-26 MED ORDER — PHENOL 1.4 % MT LIQD: 1.0000 | OROMUCOSAL | Status: DC | PRN

## 2022-10-26 MED ORDER — EPHEDRINE 5 MG/ML INJ
INTRAVENOUS | Status: AC
  Filled 2022-10-26: qty 5

## 2022-10-26 MED ORDER — CHLORHEXIDINE GLUCONATE 0.12 % MT SOLN
OROMUCOSAL | Status: AC
  Administered 2022-10-26: 15 mL via OROMUCOSAL
  Filled 2022-10-26: qty 15

## 2022-10-26 MED ORDER — ACETAMINOPHEN 10 MG/ML IV SOLN
INTRAVENOUS | Status: AC
  Filled 2022-10-26: qty 100

## 2022-10-26 MED ORDER — PANTOPRAZOLE SODIUM 40 MG PO TBEC
40.0000 mg | DELAYED_RELEASE_TABLET | Freq: Every day | ORAL | Status: DC
  Administered 2022-10-26 – 2022-10-27 (×2): 40 mg via ORAL
  Filled 2022-10-26 (×2): qty 1

## 2022-10-26 MED ORDER — ALBUMIN HUMAN 5 % IV SOLN: INTRAVENOUS | Status: DC | PRN

## 2022-10-26 MED ORDER — PHENYLEPHRINE 80 MCG/ML (10ML) SYRINGE FOR IV PUSH (FOR BLOOD PRESSURE SUPPORT)
PREFILLED_SYRINGE | INTRAVENOUS | Status: AC
  Filled 2022-10-26: qty 10

## 2022-10-26 MED ORDER — ONDANSETRON HCL 4 MG/2ML IJ SOLN
4.0000 mg | Freq: Once | INTRAMUSCULAR | Status: AC | PRN
  Administered 2022-10-26: 4 mg via INTRAVENOUS

## 2022-10-26 MED ORDER — VASOPRESSIN 20 UNIT/ML IV SOLN
INTRAVENOUS | Status: AC
  Filled 2022-10-26: qty 1

## 2022-10-26 MED ORDER — ALPRAZOLAM 0.5 MG PO TABS
1.0000 mg | ORAL_TABLET | Freq: Three times a day (TID) | ORAL | Status: DC
  Administered 2022-10-26 – 2022-10-27 (×3): 1 mg via ORAL
  Filled 2022-10-26 (×3): qty 2

## 2022-10-26 MED ORDER — CEFAZOLIN SODIUM-DEXTROSE 2-4 GM/100ML-% IV SOLN
2.0000 g | INTRAVENOUS | Status: AC
  Administered 2022-10-26: 2 g via INTRAVENOUS

## 2022-10-26 MED ORDER — METHOCARBAMOL 500 MG PO TABS
ORAL_TABLET | ORAL | Status: AC
  Filled 2022-10-26: qty 2

## 2022-10-26 MED ORDER — MENTHOL 3 MG MT LOZG: 1.0000 | LOZENGE | OROMUCOSAL | Status: DC | PRN

## 2022-10-26 MED ORDER — DULOXETINE HCL 30 MG PO CPEP
60.0000 mg | ORAL_CAPSULE | Freq: Two times a day (BID) | ORAL | Status: DC
  Administered 2022-10-26 – 2022-10-27 (×2): 60 mg via ORAL
  Filled 2022-10-26 (×2): qty 2

## 2022-10-26 MED ORDER — ORAL CARE MOUTH RINSE: 15.0000 mL | Freq: Once | OROMUCOSAL | Status: AC

## 2022-10-26 MED ORDER — OXYCODONE HCL 5 MG PO TABS: 5.0000 mg | ORAL_TABLET | ORAL | Status: DC | PRN

## 2022-10-26 MED ORDER — GLYCOPYRROLATE 0.2 MG/ML IJ SOLN
INTRAMUSCULAR | Status: DC | PRN
  Administered 2022-10-26: .2 mg via INTRAVENOUS

## 2022-10-26 MED ORDER — KETAMINE HCL 10 MG/ML IJ SOLN
INTRAMUSCULAR | Status: DC | PRN
Start: 2022-10-26 — End: 2022-10-26
  Administered 2022-10-26: 50 mg via INTRAVENOUS

## 2022-10-26 MED ORDER — ONDANSETRON HCL 4 MG/2ML IJ SOLN
4.0000 mg | Freq: Four times a day (QID) | INTRAMUSCULAR | Status: DC | PRN
  Administered 2022-10-26: 4 mg via INTRAVENOUS
  Filled 2022-10-26: qty 2

## 2022-10-26 MED ORDER — LIDOCAINE 2% (20 MG/ML) 5 ML SYRINGE
INTRAMUSCULAR | Status: AC
  Filled 2022-10-26: qty 5

## 2022-10-26 MED ORDER — FLUTICASONE PROPIONATE 50 MCG/ACT NA SUSP
1.0000 | Freq: Two times a day (BID) | NASAL | Status: DC
  Administered 2022-10-26: 1 via NASAL
  Filled 2022-10-26: qty 16

## 2022-10-26 MED ORDER — PROMETHAZINE HCL 25 MG PO TABS
25.0000 mg | ORAL_TABLET | Freq: Three times a day (TID) | ORAL | Status: DC | PRN
  Administered 2022-10-26 – 2022-10-27 (×2): 25 mg via ORAL
  Filled 2022-10-26 (×4): qty 1

## 2022-10-26 MED ORDER — NITROFURANTOIN MONOHYD MACRO 100 MG PO CAPS
100.0000 mg | ORAL_CAPSULE | Freq: Every day | ORAL | Status: DC
  Administered 2022-10-26: 100 mg via ORAL
  Filled 2022-10-26 (×2): qty 1

## 2022-10-26 MED ORDER — PANCRELIPASE (LIP-PROT-AMYL) 36000-114000 UNITS PO CPEP
36000.0000 [IU] | ORAL_CAPSULE | Freq: Three times a day (TID) | ORAL | Status: DC
  Administered 2022-10-26 – 2022-10-27 (×2): 36000 [IU] via ORAL
  Filled 2022-10-26 (×4): qty 1

## 2022-10-26 MED ORDER — IPRATROPIUM-ALBUTEROL 0.5-2.5 (3) MG/3ML IN SOLN: 3.0000 mL | Freq: Four times a day (QID) | RESPIRATORY_TRACT | Status: DC | PRN

## 2022-10-26 MED ORDER — ACETAMINOPHEN 10 MG/ML IV SOLN
INTRAVENOUS | Status: DC | PRN
  Administered 2022-10-26: 1000 mg via INTRAVENOUS

## 2022-10-26 MED ORDER — KETAMINE HCL 50 MG/5ML IJ SOSY
PREFILLED_SYRINGE | INTRAMUSCULAR | Status: AC
  Filled 2022-10-26: qty 5

## 2022-10-26 MED ORDER — EPHEDRINE SULFATE-NACL 50-0.9 MG/10ML-% IV SOSY
PREFILLED_SYRINGE | INTRAVENOUS | Status: DC | PRN
  Administered 2022-10-26: 10 mg via INTRAVENOUS
  Administered 2022-10-26: 15 mg via INTRAVENOUS

## 2022-10-26 MED ORDER — PREGABALIN 75 MG PO CAPS
150.0000 mg | ORAL_CAPSULE | Freq: Two times a day (BID) | ORAL | Status: DC
  Administered 2022-10-26 – 2022-10-27 (×2): 150 mg via ORAL
  Filled 2022-10-26 (×2): qty 2

## 2022-10-26 MED ORDER — SUCCINYLCHOLINE CHLORIDE 200 MG/10ML IV SOSY
PREFILLED_SYRINGE | INTRAVENOUS | Status: AC
  Filled 2022-10-26: qty 10

## 2022-10-26 MED ORDER — LEVOTHYROXINE SODIUM 137 MCG PO TABS
137.0000 ug | ORAL_TABLET | Freq: Every day | ORAL | Status: DC
  Administered 2022-10-27: 137 ug via ORAL
  Filled 2022-10-26: qty 1

## 2022-10-26 MED ORDER — ACETAMINOPHEN 325 MG PO TABS: 650.0000 mg | ORAL_TABLET | ORAL | Status: DC | PRN

## 2022-10-26 MED ORDER — PROPOFOL 1000 MG/100ML IV EMUL
INTRAVENOUS | Status: AC
  Filled 2022-10-26: qty 100

## 2022-10-26 MED ORDER — CEFAZOLIN SODIUM-DEXTROSE 2-4 GM/100ML-% IV SOLN
INTRAVENOUS | Status: AC
  Filled 2022-10-26: qty 100

## 2022-10-26 MED ORDER — ACETAMINOPHEN 10 MG/ML IV SOLN: 1000.0000 mg | Freq: Once | INTRAVENOUS | Status: DC | PRN

## 2022-10-26 MED ORDER — OXYCODONE HCL 5 MG PO TABS
ORAL_TABLET | ORAL | Status: AC
  Filled 2022-10-26: qty 1

## 2022-10-26 MED ORDER — LIDOCAINE 2% (20 MG/ML) 5 ML SYRINGE
INTRAMUSCULAR | Status: DC | PRN
  Administered 2022-10-26: 100 mg via INTRAVENOUS

## 2022-10-26 MED ORDER — ALBUTEROL SULFATE (2.5 MG/3ML) 0.083% IN NEBU: 3.0000 mL | INHALATION_SOLUTION | RESPIRATORY_TRACT | Status: DC | PRN

## 2022-10-26 MED ORDER — PHENYLEPHRINE 80 MCG/ML (10ML) SYRINGE FOR IV PUSH (FOR BLOOD PRESSURE SUPPORT)
PREFILLED_SYRINGE | INTRAVENOUS | Status: DC | PRN
  Administered 2022-10-26 (×2): 160 ug via INTRAVENOUS
  Administered 2022-10-26: 80 ug via INTRAVENOUS

## 2022-10-26 MED ORDER — OXYCODONE HCL 5 MG/5ML PO SOLN: 5.0000 mg | Freq: Once | ORAL | Status: AC | PRN

## 2022-10-26 MED ORDER — OXYCODONE HCL 5 MG PO TABS
10.0000 mg | ORAL_TABLET | ORAL | Status: DC | PRN
  Administered 2022-10-26 – 2022-10-27 (×5): 10 mg via ORAL
  Filled 2022-10-26 (×5): qty 2

## 2022-10-26 MED ORDER — OXYCODONE HCL 5 MG PO TABS
5.0000 mg | ORAL_TABLET | Freq: Once | ORAL | Status: AC | PRN
  Administered 2022-10-26: 5 mg via ORAL

## 2022-10-26 MED ORDER — MIDAZOLAM HCL 2 MG/2ML IJ SOLN
INTRAMUSCULAR | Status: DC | PRN
  Administered 2022-10-26: 2 mg via INTRAVENOUS

## 2022-10-26 MED ORDER — ONDANSETRON HCL 4 MG PO TABS: 4.0000 mg | ORAL_TABLET | Freq: Four times a day (QID) | ORAL | Status: DC | PRN

## 2022-10-26 SURGICAL SUPPLY — 46 items
ADH SKN CLS APL DERMABOND .7 (GAUZE/BANDAGES/DRESSINGS) ×1
APL SKNCLS STERI-STRIP NONHPOA (GAUZE/BANDAGES/DRESSINGS) ×1
BAG COUNTER SPONGE SURGICOUNT (BAG) ×1 IMPLANT
BAG SPNG CNTER NS LX DISP (BAG) ×1
BENZOIN TINCTURE PRP APPL 2/3 (GAUZE/BANDAGES/DRESSINGS) IMPLANT
BLADE CLIPPER SURG (BLADE) IMPLANT
BONE MATRIX OSTEOCEL PRO LRG (Bone Implant) IMPLANT
BUR PRECISION FLUTE 6.0 (BURR) ×1 IMPLANT
DERMABOND ADVANCED .7 DNX12 (GAUZE/BANDAGES/DRESSINGS) IMPLANT
DRAPE C-ARM 42X72 X-RAY (DRAPES) ×1 IMPLANT
DRAPE C-ARMOR (DRAPES) ×1 IMPLANT
DRAPE LAPAROTOMY 100X72X124 (DRAPES) ×1 IMPLANT
DRSG OPSITE POSTOP 4X6 (GAUZE/BANDAGES/DRESSINGS) IMPLANT
DURAPREP 26ML APPLICATOR (WOUND CARE) ×1 IMPLANT
ELECT REM PT RETURN 9FT ADLT (ELECTROSURGICAL) ×1
ELECTRODE REM PT RTRN 9FT ADLT (ELECTROSURGICAL) ×1 IMPLANT
GAUZE 4X4 16PLY ~~LOC~~+RFID DBL (SPONGE) IMPLANT
GLOVE BIO SURGEON STRL SZ8 (GLOVE) ×1 IMPLANT
GLOVE BIO SURGEON STRL SZ8.5 (GLOVE) ×1 IMPLANT
GLOVE EXAM NITRILE XL STR (GLOVE) IMPLANT
GOWN STRL REUS W/ TWL LRG LVL3 (GOWN DISPOSABLE) IMPLANT
GOWN STRL REUS W/ TWL XL LVL3 (GOWN DISPOSABLE) IMPLANT
GOWN STRL REUS W/TWL LRG LVL3 (GOWN DISPOSABLE)
GOWN STRL REUS W/TWL XL LVL3 (GOWN DISPOSABLE)
KIT BASIN OR (CUSTOM PROCEDURE TRAY) ×1 IMPLANT
KIT DILATOR XLIF 5 (KITS) IMPLANT
KIT SURGICAL ACCESS MAXCESS 4 (KITS) IMPLANT
KIT TURNOVER KIT B (KITS) ×1 IMPLANT
MODULE NVM5 NEXT GEN EMG (NEUROSURGERY SUPPLIES) IMPLANT
NDL HYPO 25X1 1.5 SAFETY (NEEDLE) ×1 IMPLANT
NEEDLE HYPO 25X1 1.5 SAFETY (NEEDLE) ×1 IMPLANT
NS IRRIG 1000ML POUR BTL (IV SOLUTION) ×1 IMPLANT
PACK LAMINECTOMY NEURO (CUSTOM PROCEDURE TRAY) ×1 IMPLANT
PUTTY DBM INSTAFILL CART 5CC (Putty) IMPLANT
SPACER RISE-L 18X50 7-14MM (Spacer) IMPLANT
SPONGE T-LAP 4X18 ~~LOC~~+RFID (SPONGE) IMPLANT
STRIP CLOSURE SKIN 1/2X4 (GAUZE/BANDAGES/DRESSINGS) IMPLANT
SUT VIC AB 1 CT1 18XBRD ANBCTR (SUTURE) ×1 IMPLANT
SUT VIC AB 1 CT1 8-18 (SUTURE) ×1
SUT VIC AB 2-0 CP2 18 (SUTURE) ×1 IMPLANT
SUT VIC AB 3-0 SH 8-18 (SUTURE) ×1 IMPLANT
TIP CONICAL INSTAFILL (ORTHOPEDIC DISPOSABLE SUPPLIES) IMPLANT
TOWEL GREEN STERILE (TOWEL DISPOSABLE) ×1 IMPLANT
TOWEL GREEN STERILE FF (TOWEL DISPOSABLE) ×1 IMPLANT
TRAY FOLEY MTR SLVR 16FR STAT (SET/KITS/TRAYS/PACK) ×1 IMPLANT
WATER STERILE IRR 1000ML POUR (IV SOLUTION) ×1 IMPLANT

## 2022-10-26 NOTE — Transfer of Care (Signed)
Immediate Anesthesia Transfer of Care Note  Patient: [name]  Procedure(s) Performed: ANTERIOR LATERAL LUMBAR INTERBODY FUSION, INTERBODY PROSTHESIS,INSTRUMENTATION, LUMBAR TWO-THREE, LUMBAR THREE-FOUR (Left)  Patient Location: PACU  Anesthesia Type:General  Level of Consciousness: drowsy  Airway & Oxygen Therapy: Patient Spontanous Breathing and Patient connected to nasal cannula oxygen  Post-op Assessment: Report given to RN and Post -op Vital signs reviewed and stable  Post vital signs: Reviewed and stable  Last Vitals:  Vitals Value Taken Time  BP 101/51 10/26/22 1118  Temp 36.8 C 10/26/22 1118  Pulse 80 10/26/22 1123  Resp 17 10/26/22 1123  SpO2 96 % 10/26/22 1123  Vitals shown include unfiled device data.  Last Pain:  Vitals:   10/26/22 1118  PainSc: 0-No pain         Complications: No notable events documented.

## 2022-10-26 NOTE — Anesthesia Preprocedure Evaluation (Signed)
Anesthesia Evaluation  Patient identified by MRN, date of birth, ID band Patient awake    Reviewed: Allergy & Precautions, NPO status , Patient's Chart, lab work & pertinent test results, reviewed documented beta blocker date and time   History of Anesthesia Complications (+) PONV and history of anesthetic complications  Airway Mallampati: II  TM Distance: >3 FB Neck ROM: Full    Dental no notable dental hx.    Pulmonary pneumonia, neg COPD, neg PE   breath sounds clear to auscultation       Cardiovascular (-) hypertension(-) angina (-) CAD, (-) Past MI, (-) Cardiac Stents, (-) CABG and (-) CHF (-) dysrhythmias  Rhythm:Regular Rate:Normal     Neuro/Psych neg Seizures PSYCHIATRIC DISORDERS Anxiety Depression       GI/Hepatic ,GERD  ,,(+) neg Cirrhosis        Endo/Other  Hypothyroidism    Renal/GU Renal disease     Musculoskeletal  (+) Arthritis ,    Abdominal   Peds  Hematology  (+) Blood dyscrasia, anemia   Anesthesia Other Findings   Reproductive/Obstetrics                              Anesthesia Physical Anesthesia Plan  ASA: 2  Anesthesia Plan: General   Post-op Pain Management:    Induction: Intravenous  PONV Risk Score and Plan: TIVA  Airway Management Planned: Oral ETT  Additional Equipment:   Intra-op Plan:   Post-operative Plan: Extubation in OR  Informed Consent: I have reviewed the patients History and Physical, chart, labs and discussed the procedure including the risks, benefits and alternatives for the proposed anesthesia with the patient or authorized representative who has indicated his/her understanding and acceptance.     Dental advisory given  Plan Discussed with: CRNA  Anesthesia Plan Comments:          Anesthesia Quick Evaluation

## 2022-10-26 NOTE — H&P (Signed)
Subjective: The patient is a 57 year old white female whose had a previous lumbar fusion.  She developed recurrent back and right greater left leg pain consistent with lumbar radiculopathy.  She has failed medical management.  She was worked up with a lumbar myelo CT which demonstrated this degeneration, stenosis, etc. most prominent at L2-3 and L3-4.  I discussed the various treatment.  She has decided proceed with surgery.  Past Medical History:  Diagnosis Date   Anemia    Anxiety    Bronchitis    hx of    Cervical pain (neck)    Chronic pancreatitis (HCC)    Closed fracture of base of fifth metatarsal bone of right foot at metaphyseal-diaphyseal junction    Closed fracture of left lateral malleolus 12/29/2017   saw podiatry at [facility] and was  prescribed bone growth stimulator device to use before she has knee surgery    Constipation    DDD (degenerative disc disease), cervical    Depression    GERD (gastroesophageal reflux disease)    History of kidney stones    Pt takes Hctz to assist with preventing   History of pancreatitis 01/04/2011   Hypothyroidism    IBS (irritable bowel syndrome)    Interstitial cystitis    Nausea DUE TO PAIN FROM KIDNEY STONE   Pancreatic pseudocyst    Pneumonia    hx of COVID pneumonia   PONV (postoperative nausea and vomiting)    Right flank pain    Right knee pain    Right ureteral stone    Swelling of right knee joint    Urinary tract infection    hx of     Past Surgical History:  Procedure Laterality Date   ANAL RECTAL MANOMETRY N/A 09/04/2016   Procedure: ANO RECTAL MANOMETRY;  Surgeon: Willis Modena, MD;  Location: [facility];  Service: Endoscopy;  Laterality: N/A;   ANTERIOR LUMBAR FUSION  11/09/2010   L5 - S1   BACK SURGERY  03/13/2010   lower back surgery    CERVICAL FUSION  03/13/1997   C4 - 7   CERVICAL SPINE SURGERY  06/11/2009   REMOVAL SCAR TISSUE   colonscopy      CYSTO/ RIGHT RETROGRADE PYELOGRAM/ RIGHT  URETEROSCOPY AND STENT PLACEMENT  09/24/2009   HX RIGHT URETERAL CALCULUS   CYSTOSCOPY WITH URETEROSCOPY  12/20/2011   Procedure: CYSTOSCOPY WITH URETEROSCOPY;  Surgeon: Milford Cage, MD;  Location: [facility]  Service: Urology;  Laterality: Right;   DIAGNOSTIC LAPAROSCOPY  03/13/1990   ENDOMETRIOSIS   EUS  03/01/2011   Procedure: UPPER ENDOSCOPIC ULTRASOUND (EUS) LINEAR;  Surgeon: Freddy Jaksch, MD;  Location: [facility];  Service: Endoscopy;  Laterality: N/A;   FINE NEEDLE ASPIRATION  03/01/2011   Procedure: FINE NEEDLE ASPIRATION (FNA) LINEAR;  Surgeon: Freddy Jaksch, MD;  Location: [facility];  Service: Endoscopy;  Laterality: N/A;   HERNIA REPAIR  AS CHILD   JOINT REPLACEMENT     KNEE ARTHROSCOPY  10/12/2011   RIGHT KNEE   LAPAROSCOPIC ASSISTED VAGINAL HYSTERECTOMY  03/14/1995   LAPAROSCOPIC CHOLECYSTECTOMY  01/09/2011   ROBOTIC-ASSISTED RIGHT SALPINGO-OOPHORECTOMY W/ LYSIS ADHESIONS  12/21/2009   HX ENDOMETRIOSIS AND PELVIC PAIN   TONSILLECTOMY  AS CHILD   TOTAL KNEE ARTHROPLASTY Right 01/13/2014   Procedure: RIGHT TOTAL KNEE ARTHROPLASTY;  Surgeon: Shelda Pal, MD;  Location: [facility];  Service: Orthopedics;  Laterality: Right;   TOTAL KNEE ARTHROPLASTY Left 12/13/2015   Procedure: LEFT TOTAL KNEE ARTHROPLASTY;  Surgeon: Ollen Gross, MD;  Location: [facility];  Service: Orthopedics;  Laterality: Left;   TOTAL KNEE REVISION Right 08/31/2014   Procedure: RIGHT TOTAL KNEE ARTHROPLASTY RESECTION;  Surgeon: Durene Romans, MD;  Location: [facility];  Service: Orthopedics;  Laterality: Right;   TOTAL KNEE REVISION Right 01/09/2018   Procedure: RIGHT TOTAL KNEE REVISION;  Surgeon: Ollen Gross, MD;  Location: [facility];  Service: Orthopedics;  Laterality: Right;  Adductor Block   URETEROSCOPIC STONE EXTRACTIONS     X3    Allergies  Allergen Reactions   Deltasone [Prednisone] Other (See Comments)    Puts her into a coma. Was admitted to ICU for it.    Methylprednisolone Sodium Succ Shortness Of Breath and Other (See Comments)    Sob, Ams    Solu-Medrol [Methylprednisolone] Shortness Of Breath and Other (See Comments)    Sob, Ams   Corticosteroids    Fentanyl Other (See Comments)    Pt prefers not to have this drug. "Makes her a different person"   Septra Ds [Sulfamethoxazole-Trimethoprim] Other (See Comments)    Unknown   Dilaudid [Hydromorphone Hcl] Other (See Comments)    Hallucinations   Zolpidem Tartrate Other (See Comments)    hallucinations    Social History   Tobacco Use   Smoking status: Never   Smokeless tobacco: Never  Substance Use Topics   Alcohol use: No    Family History  Problem Relation Age of Onset   Constipation Mother    Cancer Father    Fibromyalgia Sister    Prior to Admission medications   Medication Sig Start Date End Date Taking? Authorizing Provider  albuterol (PROVENTIL HFA;VENTOLIN HFA) 108 (90 BASE) MCG/ACT inhaler Inhale 2 puffs into the lungs every 4 (four) hours as needed for wheezing or shortness of breath.    Yes [provider]  ALPRAZolam Prudy Feeler) 1 MG tablet Take 1 mg by mouth 3 (three) times daily.   Yes [provider]  buPROPion (WELLBUTRIN XL) 300 MG 24 hr tablet Take 300 mg by mouth every morning.   Yes [provider]  cetirizine (ZYRTEC) 10 MG tablet Take 10 mg by mouth every morning.   Yes [provider]  docusate sodium (COLACE) 100 MG capsule Take 1 capsule (100 mg total) by mouth 2 (two) times daily. Patient taking differently: Take 200 mg by mouth at bedtime as needed for moderate constipation. 09/01/14  Yes Babish, Molli Hazard, PA-C  doxylamine, Sleep, (UNISOM) 25 MG tablet Take 25 mg by mouth at bedtime.   Yes [provider]  DULoxetine (CYMBALTA) 60 MG capsule Take 60 mg by mouth 2 (two) times daily.   Yes [provider]  fluticasone (FLONASE) 50 MCG/ACT nasal spray Place 1 spray into both nostrils 2 (two) times daily.   Yes  [provider]  hydrochlorothiazide (HYDRODIURIL) 25 MG tablet Take 25 mg by mouth daily.    Yes [provider]  levothyroxine (SYNTHROID) 137 MCG tablet Take 137 mcg by mouth daily before breakfast.   Yes [provider]  lipase/protease/amylase (CREON) 36000 UNITS CPEP capsule Take 36,000 Units by mouth 3 (three) times daily before meals.   Yes [provider]  Meth-Hyo-M Bl-Na Phos-Ph Sal (URIBEL) 118 MG CAPS Take 118 mg by mouth every 6 (six) hours as needed (for UTI).   Yes [provider]  methocarbamol (ROBAXIN) 500 MG tablet Take 1 tablet (500 mg total) by mouth every 6 (six) hours as needed for muscle spasms. Patient taking differently: Take  500 mg by mouth 4 (four) times daily. 01/10/18  Yes Edmisten, Kristie L, PA  nitrofurantoin, macrocrystal-monohydrate, (MACROBID) 100 MG capsule Take 100 mg by mouth at bedtime.   Yes [provider]  NUCYNTA ER 150 MG TB12 Take 150 mg by mouth every 12 (twelve) hours. 12/10/17  Yes [provider]  Nutritional Supplements (ESTROVEN PO) Take 1 tablet by mouth daily. Mood   Yes [provider]  omeprazole (PRILOSEC) 20 MG capsule Take 20 mg by mouth daily as needed (Heartburn). 01/25/21  Yes [provider]  oxyCODONE (OXY IR/ROXICODONE) 5 MG immediate release tablet Take 1-2 tablets (5-10 mg total) by mouth every 6 (six) hours as needed for moderate pain (pain score 4-6). Patient taking differently: Take 10 mg by mouth 3 (three) times daily. 01/10/18  Yes Edmisten, Kristie L, PA  pentosan polysulfate (ELMIRON) 100 MG capsule Take 100 mg by mouth 3 (three) times daily.   Yes [provider]  polycarbophil (FIBERCON) 625 MG tablet Take 625 mg by mouth 3 (three) times daily as needed for mild constipation or moderate constipation.   Yes [provider]  pregabalin (LYRICA) 150 MG capsule Take 150 mg by mouth every 12 (twelve) hours.   Yes [provider]  promethazine (PHENERGAN) 25 MG tablet Take 1 tablet (25 mg total) by mouth 3 (three) times daily as needed for nausea. Patient taking differently: Take 25 mg by mouth every 4 (four) hours as needed for nausea or vomiting. 09/01/14  Yes Babish, Molli Hazard, PA-C  tamsulosin (FLOMAX) 0.4 MG CAPS capsule Take 0.4 mg by mouth daily.   Yes [provider]  benzonatate (TESSALON) 200 MG capsule Take 200 mg by mouth daily as needed for cough.    [provider]  diclofenac sodium (VOLTAREN) 1 % GEL Apply 1 application topically 4 (four) times daily as needed (for pain).     [provider]  hydrocortisone-pramoxine [facility]) rectal foam Place 1 applicator rectally 2 (two) times daily as needed for hemorrhoids.    [provider]  ipratropium-albuterol (DUONEB) 0.5-2.5 (3) MG/3ML SOLN SMARTSIG:3 Milliliter(s) Via Nebulizer Every 6 Hours PRN 12/23/20   [provider]     Review of Systems  Positive ROS: As above  All other systems have been reviewed and were otherwise negative with the exception of those mentioned in the HPI and as above.  Objective: Vital signs in last 24 hours: Temp:  [98.5 F (36.9 C)] 98.5 F (36.9 C) (08/15 0553) Pulse Rate:  [71] 71 (08/15 0553) Resp:  [18] 18 (08/15 0553) BP: (115)/(85) 115/85 (08/15 0553) SpO2:  [94 %] 94 % (08/15 0553) Weight:  [79.4 kg] 79.4 kg (08/15 0553) Estimated body mass index is 29.57 kg/m as calculated from the following:   Height as of this encounter: 5' 4.5" (1.638 m).   Weight as of this encounter: 79.4 kg.   General Appearance: Alert Head: Normocephalic, without obvious abnormality, atraumatic Eyes: PERRL, conjunctiva/corneas clear, EOM's intact,    Ears: Normal  Throat: Normal  Neck: Supple, Back: unremarkable Lungs: Clear to auscultation bilaterally, respirations unlabored Heart: Regular rate and rhythm, no murmur, rub or gallop Abdomen: Soft, non-tender Extremities:  Extremities normal, atraumatic, no cyanosis or edema Skin: unremarkable  NEUROLOGIC:   Mental status: alert and oriented,Motor Exam - grossly normal Sensory Exam - grossly normal Reflexes:  Coordination - grossly normal Gait - grossly normal Balance - grossly normal Cranial Nerves: I: smell Not tested  II: visual acuity  OS: Normal  OD: Normal   II: visual fields Full to confrontation  II: pupils Equal, round, reactive to light  III,VII: ptosis None  III,IV,VI: extraocular muscles  Full ROM  V: mastication Normal  V: facial light touch sensation  Normal  V,VII: corneal reflex  Present  VII: facial muscle function - upper  Normal  VII: facial muscle function - lower Normal  VIII: hearing Not tested  IX: soft palate elevation  Normal  IX,X: gag reflex Present  XI: trapezius strength  5/5  XI: sternocleidomastoid strength 5/5  XI: neck flexion strength  5/5  XII: tongue strength  Normal    Data Review Lab Results  Component Value Date   WBC 4.7 10/19/2022   HGB 12.4 10/19/2022   HCT 39.6 10/19/2022   MCV 83.0 10/19/2022   PLT 245 10/19/2022   Lab Results  Component Value Date   NA 139 10/19/2022   K 3.1 (L) 10/19/2022   CL 101 10/19/2022   CO2 32 10/19/2022   BUN 14 10/19/2022   CREATININE 0.88 10/19/2022   GLUCOSE 103 (H) 10/19/2022   Lab Results  Component Value Date   INR 0.92 01/04/2018    Assessment/Plan: Lumbar degenerative disease, lumbar spinal stenosis, lumbar radiculopathy, degenerative scoliosis: I discussed the situation with the patient.  I reviewed her imaging studies with her and pointed out the abnormalities.  We have discussed the various treatment options including surgery.  I have described the surgical treatment option of a anterolateral L2-3 and L3-4 instrumentation and fusion.  I have shown her surgical models.  We have discussed the risk, benefits, alternatives, expected postop course, and likelihood of achieving goals with surgery.  The  patient's questions.  She has decided proceed with surgery.   Cristi Loron 10/26/2022 7:24 AM

## 2022-10-26 NOTE — Progress Notes (Signed)
Orthopedic Tech Progress Note Patient Details:  Sairah Gamez [facility] 20-Jun-1965 [id number]  Ortho Devices Type of Ortho Device: Lumbar corsett Ortho Device/Splint Location: on pt/low back Ortho Device/Splint Interventions: Ordered, Application, Adjustment   Post Interventions Patient Tolerated: Well Instructions Provided: Care of device, Adjustment of device  Dominga Mcduffie Carmine Savoy 10/26/2022, 4:37 PM

## 2022-10-26 NOTE — Op Note (Signed)
Brief history: The patient is a 57 year old white female whose had a previous L5-S1 fusion.  She has developed persistent back and leg pain.  She failed medical management.  She was worked up with a lumbar myelo CT and lumbar x-rays which demonstrated a degenerative scoliosis, degenerative disease, foraminal stenosis, etc.  I discussed the various treatment options with her.  She has decided proceed with surgery.  Preop diagnosis: Lumbar degenerative disease, lumbar foraminal stenosis, lumbar radiculopathy, lumbago, lumbar scoliosis  Postop diagnosis: The same  Procedure: Left L2-3 and L3-4 anterior lateral interbody fusion with morselized allograft bone graft extender  insertion of interbody expandable prosthesis (globus) with neuro monitoring  Surgeon: Dr. Delma Officer  Assistant: Hildred Priest, NP  Anesthesia: General Tracheal  Estimated blood loss: 100 cc  Specimens: None  Drains: None  Complications: None  Description of procedure: The patient was brought to the operating room by the anesthesia team.  General endotracheal anesthesia was induced.  The neuromonitoring leads were placed.  The patient was placed in the lateral position with her left side up.  She was appropriately padded.  The bed was flexed.  The patient's left flank was then prepared with Betadine scrub and Betadine solution.  Sterile drapes were applied.  I then injected the area to be incised with Marcaine with epinephrine solution.  I made an incision in between the L2-3 and L3-4 interspace in the patient's left flank and another incision more posteriorly.  I used finger dissection to dissected into the retroperitoneal space and to the lateral aspect of the spine.  Under both AP and lateral fluoroscopy I placed a K wire in the L3-4 disc base.  We then placed sequential dilators over the K wire being that the dilators were free.  We stimulated dilators and 360 degrees and noted no significant neural stimulation.  We  then placed the retractor over the disc space and osteophytes and shim into the disc space.  I then incised the lateral aspect of the left L3-4 disc base.  It was quite collapsed.  There was quite a bit of vacuum disc phenomenon.  I used the Cobb elevators to scrape the endplates and break the contralateral osteophyte free.  I used the curettes and the rasp to cleared the vertebral endplates of soft tissue/disc.  We then inserted the trial.  We removed the trial.  I then inserted a globus expandable interbody prosthesis into the L3-4 disc space under both AP and lateral fluoroscopy.  I expanded the interbody prosthesis.  We filled in the anterior of the interbody prosthesis with bone graft.  This completed the fusion L3-4.  We then repeated this procedure in analogous fashion at L2-3, inserting another expandable prosthesis at L2-3.  We then obtained hemostasis with bipolar cautery.  We removed the retractor.  We then reapproximated patient's lumbar fascia with interrupted #1 Vicryl suture.  We reapproximated the subcutaneous tissue in both incisions with 2-0 Vicryl suture.  We reapproximated the skin at both incisions with Steri-Strips and benzoin.  The wound was then coated with bacitracin ointment.  A sterile dressing was applied.  The drapes were removed.  By report all sponge, instrument, and needle counts were correct at the end of this case.

## 2022-10-26 NOTE — Anesthesia Procedure Notes (Addendum)
Procedure Name: Intubation Date/Time: 10/26/2022 7:52 AM  Performed by: Camillia Herter, CRNAPre-anesthesia Checklist: Patient identified, Emergency Drugs available, Suction available and Patient being monitored Patient Re-evaluated:Patient Re-evaluated prior to induction Oxygen Delivery Method: Circle System Utilized Preoxygenation: Pre-oxygenation with 100% oxygen Induction Type: IV induction Ventilation: Mask ventilation without difficulty Laryngoscope Size: Miller and 2 Grade View: Grade I Tube type: Oral Tube size: 7.0 mm Number of attempts: 1 Airway Equipment and Method: Stylet and Oral airway Placement Confirmation: ETT inserted through vocal cords under direct vision, positive ETCO2 and breath sounds checked- equal and bilateral Secured at: 21 cm Tube secured with: Tape Dental Injury: Teeth and Oropharynx as per pre-operative assessment  Comments: Soft bite block placed

## 2022-10-27 ENCOUNTER — Encounter (HOSPITAL_COMMUNITY): Payer: Self-pay | Admitting: Neurosurgery

## 2022-10-27 DIAGNOSIS — Z981 Arthrodesis status: Secondary | ICD-10-CM | POA: Diagnosis not present

## 2022-10-27 DIAGNOSIS — M48061 Spinal stenosis, lumbar region without neurogenic claudication: Secondary | ICD-10-CM | POA: Diagnosis not present

## 2022-10-27 DIAGNOSIS — M5116 Intervertebral disc disorders with radiculopathy, lumbar region: Secondary | ICD-10-CM | POA: Diagnosis not present

## 2022-10-27 DIAGNOSIS — F32A Depression, unspecified: Secondary | ICD-10-CM | POA: Diagnosis not present

## 2022-10-27 DIAGNOSIS — M4156 Other secondary scoliosis, lumbar region: Secondary | ICD-10-CM | POA: Diagnosis not present

## 2022-10-27 DIAGNOSIS — F419 Anxiety disorder, unspecified: Secondary | ICD-10-CM | POA: Diagnosis not present

## 2022-10-27 DIAGNOSIS — E039 Hypothyroidism, unspecified: Secondary | ICD-10-CM | POA: Diagnosis not present

## 2022-10-27 MED ORDER — METHOCARBAMOL 500 MG PO TABS: 500.0000 mg | ORAL_TABLET | Freq: Four times a day (QID) | ORAL | 0 refills | Status: DC | PRN

## 2022-10-27 MED ORDER — OXYCODONE-ACETAMINOPHEN 5-325 MG PO TABS: 1.0000 | ORAL_TABLET | ORAL | Status: DC | PRN

## 2022-10-27 MED ORDER — OXYCODONE-ACETAMINOPHEN 5-325 MG PO TABS: 1.0000 | ORAL_TABLET | ORAL | 0 refills | Status: DC | PRN

## 2022-10-27 NOTE — Anesthesia Postprocedure Evaluation (Signed)
Anesthesia Post Note  Patient: Kearra Mckenna Virts  Procedure(s) Performed: ANTERIOR LATERAL LUMBAR INTERBODY FUSION, INTERBODY PROSTHESIS,INSTRUMENTATION, LUMBAR TWO-THREE, LUMBAR THREE-FOUR (Left)     Patient location during evaluation: PACU Anesthesia Type: General Level of consciousness: awake and alert Pain management: pain level controlled Vital Signs Assessment: post-procedure vital signs reviewed and stable Respiratory status: spontaneous breathing, nonlabored ventilation, respiratory function stable and patient connected to nasal cannula oxygen Cardiovascular status: blood pressure returned to baseline and stable Postop Assessment: no apparent nausea or vomiting Anesthetic complications: no   No notable events documented.  Last Vitals:  Vitals:   10/27/22 0532 10/27/22 0816  BP: 98/68 95/67  Pulse: 71 73  Resp: 18 20  Temp: 36.9 C 36.7 C  SpO2: 96% 95%    Last Pain:  Vitals:   10/27/22 0816  TempSrc: Oral  PainSc:                  Mariann Barter

## 2022-10-27 NOTE — Plan of Care (Signed)

## 2022-10-27 NOTE — Discharge Summary (Signed)
Physician Discharge Summary  Patient ID: [name] MRN: [id number] DOB/AGE: Apr 11, 1965 57 y.o.  Admit date: 10/26/2022 Discharge date: 10/27/2022  Admission Diagnoses: Lumbar degenerative scoliosis, lumbar stenosis, lumbago, lumbar degenerative disease,   Discharge Diagnoses: The same Principal Problem:   Lumbar scoliosis   Discharged Condition: good  Hospital Course: I performed a left L2-3 and L3-4 anterior lumbar interbody fusion with interbody prosthesis on the patient on 10/26/2022.  The surgery went well.  The patient's postop course was unremarkable.  On postoperative day 1 she felt well and requested discharge home.  The patient was given verbal and written discharge instructions.  All her questions were answered.  Consults: PT, care management Significant Diagnostic Studies: None Treatments: Left L2-3 and L3-4 anterior lateral lumbar interbody fusion with a bioprosthesis Discharge Exam: Blood pressure 98/68, pulse 71, temperature 98.5 F (36.9 C), temperature source Oral, resp. rate 18, height 5' 4.5" (1.638 m), weight 79.4 kg, SpO2 96%. The patient is alert and pleasant.  Her strength is normal.  She looks well.  Disposition: Home  Discharge Instructions     Call MD for:  difficulty breathing, headache or visual disturbances   Complete by: As directed    Call MD for:  extreme fatigue   Complete by: As directed    Call MD for:  hives   Complete by: As directed    Call MD for:  persistant dizziness or light-headedness   Complete by: As directed    Call MD for:  persistant nausea and vomiting   Complete by: As directed    Call MD for:  redness, tenderness, or signs of infection (pain, swelling, redness, odor or green/yellow discharge around incision site)   Complete by: As directed    Call MD for:  severe uncontrolled pain   Complete by: As directed    Call MD for:  temperature >100.4   Complete by: As directed    Diet - low sodium heart healthy    Complete by: As directed    Discharge instructions   Complete by: As directed    Call [phone] for a followup appointment. Take a stool softener while you are using pain medications.   Driving Restrictions   Complete by: As directed    Do not drive for 2 weeks.   Increase activity slowly   Complete by: As directed    Lifting restrictions   Complete by: As directed    Do not lift more than 5 pounds. No excessive bending or twisting.   May shower / Bathe   Complete by: As directed    Remove the dressing for 3 days after surgery.  You may shower, but leave the incision alone.   Remove dressing in 48 hours   Complete by: As directed       Allergies as of 10/27/2022       Reactions   Deltasone [prednisone] Other (See Comments)   Puts her into a coma. Was admitted to ICU for it.   Methylprednisolone Sodium Succ Shortness Of Breath, Other (See Comments)   Sob, Ams   Solu-medrol [methylprednisolone] Shortness Of Breath, Other (See Comments)   Sob, Ams   Corticosteroids    Fentanyl Other (See Comments)   Pt prefers not to have this drug. "Makes her a different person"   Septra Ds [sulfamethoxazole-trimethoprim] Other (See Comments)   Unknown   Dilaudid [hydromorphone Hcl] Other (See Comments)   Hallucinations   Zolpidem Tartrate Other (See Comments)   hallucinations  Medication List     STOP taking these medications    Nucynta ER 150 MG Tb12 Generic drug: Tapentadol HCl   oxyCODONE 5 MG immediate release tablet Commonly known as: Oxy IR/ROXICODONE       TAKE these medications    albuterol 108 (90 Base) MCG/ACT inhaler Commonly known as: VENTOLIN HFA Inhale 2 puffs into the lungs every 4 (four) hours as needed for wheezing or shortness of breath.   ALPRAZolam 1 MG tablet Commonly known as: XANAX Take 1 mg by mouth 3 (three) times daily.   benzonatate 200 MG capsule Commonly known as: TESSALON Take 200 mg by mouth daily as needed for cough.    buPROPion 300 MG 24 hr tablet Commonly known as: WELLBUTRIN XL Take 300 mg by mouth every morning.   cetirizine 10 MG tablet Commonly known as: ZYRTEC Take 10 mg by mouth every morning.   Creon 36000 UNITS Cpep capsule Generic drug: lipase/protease/amylase Take 36,000 Units by mouth 3 (three) times daily before meals.   diclofenac sodium 1 % Gel Commonly known as: VOLTAREN Apply 1 application topically 4 (four) times daily as needed (for pain).   docusate sodium 100 MG capsule Commonly known as: COLACE Take 1 capsule (100 mg total) by mouth 2 (two) times daily. What changed:  how much to take when to take this reasons to take this   doxylamine (Sleep) 25 MG tablet Commonly known as: UNISOM Take 25 mg by mouth at bedtime.   DULoxetine 60 MG capsule Commonly known as: CYMBALTA Take 60 mg by mouth 2 (two) times daily.   ESTROVEN PO Take 1 tablet by mouth daily. Mood   fluticasone 50 MCG/ACT nasal spray Commonly known as: FLONASE Place 1 spray into both nostrils 2 (two) times daily.   hydrochlorothiazide 25 MG tablet Commonly known as: HYDRODIURIL Take 25 mg by mouth daily.   hydrocortisone-pramoxine rectal foam Commonly known as: PROCTOFOAM-HC Place 1 applicator rectally 2 (two) times daily as needed for hemorrhoids.   ipratropium-albuterol 0.5-2.5 (3) MG/3ML Soln Commonly known as: DUONEB SMARTSIG:3 Milliliter(s) Via Nebulizer Every 6 Hours PRN   levothyroxine 137 MCG tablet Commonly known as: SYNTHROID Take 137 mcg by mouth daily before breakfast.   methocarbamol 500 MG tablet Commonly known as: ROBAXIN Take 1 tablet (500 mg total) by mouth every 6 (six) hours as needed for muscle spasms. What changed:  when to take this reasons to take this   nitrofurantoin (macrocrystal-monohydrate) 100 MG capsule Commonly known as: MACROBID Take 100 mg by mouth at bedtime.   omeprazole 20 MG capsule Commonly known as: PRILOSEC Take 20 mg by mouth daily as needed  (Heartburn).   oxyCODONE-acetaminophen 5-325 MG tablet Commonly known as: PERCOCET/ROXICET Take 1-2 tablets by mouth every 4 (four) hours as needed for moderate pain.   pentosan polysulfate 100 MG capsule Commonly known as: ELMIRON Take 100 mg by mouth 3 (three) times daily.   polycarbophil 625 MG tablet Commonly known as: FIBERCON Take 625 mg by mouth 3 (three) times daily as needed for mild constipation or moderate constipation.   pregabalin 150 MG capsule Commonly known as: LYRICA Take 150 mg by mouth every 12 (twelve) hours.   promethazine 25 MG tablet Commonly known as: PHENERGAN Take 1 tablet (25 mg total) by mouth 3 (three) times daily as needed for nausea. What changed:  when to take this reasons to take this   tamsulosin 0.4 MG Caps capsule Commonly known as: FLOMAX Take 0.4 mg by mouth daily.  Uribel 118 MG Caps Take 118 mg by mouth every 6 (six) hours as needed (for UTI).         Signed: Cristi Loron 10/27/2022, 6:44 AM

## 2022-10-27 NOTE — Progress Notes (Signed)
Explained discharge instructions to patient. Reviewed follow up appointment and next medication administration times. Also reviewed education. Patient verbalized having an understanding for instructions given. All belongings are in the patient's possession to include walker. IV was removed by night shift RN.  No other needs verbalized. Transported downstairs for discharge.

## 2022-10-27 NOTE — Evaluation (Signed)
Physical Therapy Evaluation  Patient Details Name: [name] MRN: [id number] DOB: 18-Feb-1966 Today's Date: 10/27/2022  History of Present Illness  Pt is a 57 y/o female who presents s/p L2-L4 ALIF on 10/26/2022. PMH significant for prior spinal surgery x4 (cervical and lumbar), chronic pancreatitis, hypothyroidism, B TKR with revision of R knee x2.  Clinical Impression  Pt admitted with above diagnosis. At the time of PT eval, pt was able to demonstrate transfers and ambulation with gross CGA to supervision for safety, with 1 instance of LOB requiring min assist to recover. Pt was educated on precautions, brace application/wearing schedule, appropriate activity progression, and car transfer. Pt currently with functional limitations due to the deficits listed below (see PT Problem List). Pt will benefit from skilled PT to increase their independence and safety with mobility to allow discharge to the venue listed below.          If plan is discharge home, recommend the following: A little help with walking and/or transfers;A little help with bathing/dressing/bathroom;Assistance with cooking/housework;Help with stairs or ramp for entrance   Can travel by private vehicle        Equipment Recommendations Rolling walker (2 wheels)  Recommendations for Other Services       Functional Status Assessment Patient has had a recent decline in their functional status and demonstrates the ability to make significant improvements in function in a reasonable and predictable amount of time.     Precautions / Restrictions Precautions Precautions: Fall;Back Precaution Booklet Issued: Yes (comment) Precaution Comments: Reviewed handout and pt was cued for precautions during functional mobility. Required Braces or Orthoses: Spinal Brace Spinal Brace: Lumbar corset;Applied in sitting position Restrictions Weight Bearing Restrictions: No      Mobility  Bed Mobility Overal bed mobility:  Needs Assistance Bed Mobility: Rolling, Sidelying to Sit Rolling: Contact guard assist Sidelying to sit: Contact guard assist       General bed mobility comments: HOB flat and rails lowered to simulate home environment. Hands on guarding throughout for optimal log roll technique.    Transfers Overall transfer level: Needs assistance Equipment used: None Transfers: Sit to/from Stand Sit to Stand: Contact guard assist           General transfer comment: Close guard for safety as pt powered up to full stand. Mildly unsteady but no assist required.    Ambulation/Gait Ambulation/Gait assistance: Supervision, Min assist Gait Distance (Feet): 400 Feet Assistive device: None Gait Pattern/deviations: Step-through pattern, Decreased stride length, Trunk flexed, Staggering right Gait velocity: Decreased Gait velocity interpretation: <1.31 ft/sec, indicative of household ambulator   General Gait Details: Pt grossly at a supervision level without an AD. While ambulating however, pt attempted to turn her head to the R to look at therapist while continuing to walk forwards and pt lost her balance and staggered to the right, requiring min assist to recover and prevent fall.  Stairs Stairs: Yes Stairs assistance: Contact guard assist Stair Management: One rail Left, Step to pattern, Forwards Number of Stairs: 4 General stair comments: VC's for sequencing and general safety.  Wheelchair Mobility     Tilt Bed    Modified Rankin (Stroke Patients Only)       Balance Overall balance assessment: Needs assistance Sitting-balance support: Feet supported, No upper extremity supported Sitting balance-Leahy Scale: Fair     Standing balance support: No upper extremity supported, During functional activity Standing balance-Leahy Scale: Poor Standing balance comment: with balance challenges such as head turns  Pertinent Vitals/Pain Pain  Assessment Pain Assessment: Faces Faces Pain Scale: Hurts even more Pain Location: back Pain Descriptors / Indicators: Operative site guarding, Sore Pain Intervention(s): Monitored during session, Limited activity within patient's tolerance, Repositioned    Home Living Family/patient expects to be discharged to:: Private residence Living Arrangements: Spouse/significant other Available Help at Discharge: Family;Available 24 hours/day Type of Home: House Home Access: Stairs to enter Entrance Stairs-Rails: Left Entrance Stairs-Number of Steps: 4   Home Layout: One level Home Equipment: Agricultural consultant (2 wheels);Shower seat;Adaptive equipment      Prior Function Prior Level of Function : Independent/Modified Independent                     Extremity/Trunk Assessment   Upper Extremity Assessment Upper Extremity Assessment: Overall WFL for tasks assessed    Lower Extremity Assessment Lower Extremity Assessment: Generalized weakness (Mild; consistent with pre-op diagnosis.)    Cervical / Trunk Assessment Cervical / Trunk Assessment: Back Surgery  Communication   Communication Communication: No apparent difficulties  Cognition Arousal: Alert Behavior During Therapy: WFL for tasks assessed/performed Overall Cognitive Status: Within Functional Limits for tasks assessed                                          General Comments      Exercises     Assessment/Plan    PT Assessment Patient needs continued PT services  PT Problem List Decreased strength;Decreased activity tolerance;Decreased balance;Decreased mobility;Decreased knowledge of use of DME;Decreased safety awareness;Decreased knowledge of precautions;Pain       PT Treatment Interventions DME instruction;Gait training;Stair training;Functional mobility training;Therapeutic exercise;Therapeutic activities;Balance training;Patient/family education    PT Goals (Current goals can be found in  the Care Plan section)  Acute Rehab PT Goals Patient Stated Goal: Home today PT Goal Formulation: With patient/family Time For Goal Achievement: 11/03/22 Potential to Achieve Goals: Good    Frequency Min 1X/week     Co-evaluation               AM-PAC PT "6 Clicks" Mobility  Outcome Measure Help needed turning from your back to your side while in a flat bed without using bedrails?: A Little Help needed moving from lying on your back to sitting on the side of a flat bed without using bedrails?: A Little Help needed moving to and from a bed to a chair (including a wheelchair)?: A Little Help needed standing up from a chair using your arms (e.g., wheelchair or bedside chair)?: A Little Help needed to walk in hospital room?: A Little Help needed climbing 3-5 steps with a railing? : A Little 6 Click Score: 18    End of Session Equipment Utilized During Treatment: Gait belt;Back brace Activity Tolerance: Patient tolerated treatment well Patient left: in chair;with call bell/phone within reach;with family/visitor present Nurse Communication: Mobility status PT Visit Diagnosis: Unsteadiness on feet (R26.81);Pain Pain - part of body:  (back)    Time: 7829-5621 PT Time Calculation (min) (ACUTE ONLY): 28 min   Charges:   PT Evaluation $PT Eval Low Complexity: 1 Low PT Treatments $Gait Training: 8-22 mins PT General Charges $$ ACUTE PT VISIT: 1 Visit         Conni Slipper, PT, DPT Acute Rehabilitation Services Secure Chat Preferred Office: [phone]   Marylynn Pearson 10/27/2022, 11:41 AM

## 2022-11-16 DIAGNOSIS — M48061 Spinal stenosis, lumbar region without neurogenic claudication: Secondary | ICD-10-CM | POA: Diagnosis not present

## 2022-12-14 DIAGNOSIS — M47812 Spondylosis without myelopathy or radiculopathy, cervical region: Secondary | ICD-10-CM | POA: Diagnosis not present

## 2022-12-14 DIAGNOSIS — M47817 Spondylosis without myelopathy or radiculopathy, lumbosacral region: Secondary | ICD-10-CM | POA: Diagnosis not present

## 2022-12-14 DIAGNOSIS — G894 Chronic pain syndrome: Secondary | ICD-10-CM | POA: Diagnosis not present

## 2022-12-14 DIAGNOSIS — M961 Postlaminectomy syndrome, not elsewhere classified: Secondary | ICD-10-CM | POA: Diagnosis not present

## 2023-02-11 DIAGNOSIS — R300 Dysuria: Secondary | ICD-10-CM | POA: Diagnosis not present

## 2023-02-11 DIAGNOSIS — M549 Dorsalgia, unspecified: Secondary | ICD-10-CM | POA: Diagnosis not present

## 2023-02-13 DIAGNOSIS — M48061 Spinal stenosis, lumbar region without neurogenic claudication: Secondary | ICD-10-CM | POA: Diagnosis not present

## 2023-02-15 DIAGNOSIS — M47817 Spondylosis without myelopathy or radiculopathy, lumbosacral region: Secondary | ICD-10-CM | POA: Diagnosis not present

## 2023-02-15 DIAGNOSIS — G894 Chronic pain syndrome: Secondary | ICD-10-CM | POA: Diagnosis not present

## 2023-02-15 DIAGNOSIS — M47812 Spondylosis without myelopathy or radiculopathy, cervical region: Secondary | ICD-10-CM | POA: Diagnosis not present

## 2023-02-15 DIAGNOSIS — M961 Postlaminectomy syndrome, not elsewhere classified: Secondary | ICD-10-CM | POA: Diagnosis not present

## 2023-02-21 DIAGNOSIS — N3010 Interstitial cystitis (chronic) without hematuria: Secondary | ICD-10-CM | POA: Diagnosis not present

## 2023-02-21 DIAGNOSIS — Z87442 Personal history of urinary calculi: Secondary | ICD-10-CM | POA: Diagnosis not present

## 2023-04-05 ENCOUNTER — Other Ambulatory Visit: Payer: Self-pay | Admitting: Neurosurgery

## 2023-04-19 DIAGNOSIS — M47817 Spondylosis without myelopathy or radiculopathy, lumbosacral region: Secondary | ICD-10-CM | POA: Diagnosis not present

## 2023-04-19 DIAGNOSIS — G894 Chronic pain syndrome: Secondary | ICD-10-CM | POA: Diagnosis not present

## 2023-04-19 DIAGNOSIS — M961 Postlaminectomy syndrome, not elsewhere classified: Secondary | ICD-10-CM | POA: Diagnosis not present

## 2023-04-19 DIAGNOSIS — M47812 Spondylosis without myelopathy or radiculopathy, cervical region: Secondary | ICD-10-CM | POA: Diagnosis not present

## 2023-04-20 ENCOUNTER — Other Ambulatory Visit: Payer: Self-pay | Admitting: Neurosurgery

## 2023-05-07 NOTE — Progress Notes (Signed)
 Surgical Instructions   Your procedure is scheduled on Monday, May 14, 2023. Report to [facility] Main Entrance "A" at 5:30 A.M., then check in with the Admitting office. Any questions or running late day of surgery: call [phone]  Questions prior to your surgery date: call [phone], Monday-Friday, 8am-4pm. If you experience any cold or flu symptoms such as cough, fever, chills, shortness of breath, etc. between now and your scheduled surgery, please notify us at the above number.     Remember:  Do not eat or drink after midnight the night before your surgery    Take these medicines the morning of surgery with A SIP OF WATER   ALPRAZolam (XANAX)  buPROPion (WELLBUTRIN XL)  DULoxetine (CYMBALTA)  fluticasone (FLONASE)  levothyroxine (SYNTHROID)  loratadine (CLARITIN) NUCYNTA   omeprazole (PRILOSEC)  Oxycodone  pregabalin (LYRICA)  tamsulosin [facility])    May take these medicines IF NEEDED: albuterol (PROVENTIL HFA;VENTOLIN HFA) inhaler - bring with you on day of surgery.  ipratropium-albuterol (DUONEB)  promethazine (PHENERGAN)    One week prior to surgery, STOP taking any Aspirin (unless otherwise instructed by your surgeon) Aleve, Naproxen, Ibuprofen, Motrin, Advil, Goody's, BC's, all herbal medications, fish oil, and non-prescription vitamins. This includes your diclofenac sodium (VOLTAREN) gel.                      Do NOT Smoke (Tobacco/Vaping) for 24 hours prior to your procedure.  If you use a CPAP at night, you may bring your mask/headgear for your overnight stay.   You will be asked to remove any contacts, glasses, piercing's, hearing aid's, dentures/partials prior to surgery. Please bring cases for these items if needed.    Patients discharged the day of surgery will not be allowed to drive home, and someone needs to stay with them for 24 hours.  SURGICAL WAITING ROOM VISITATION Patients may have no more than 2 support people in the waiting area - these  visitors may rotate.   Pre-op nurse will coordinate an appropriate time for 1 ADULT support person, who may not rotate, to accompany patient in pre-op.  Children under the age of 55 must have an adult with them who is not the patient and must remain in the main waiting area with an adult.  If the patient needs to stay at the hospital during part of their recovery, the visitor guidelines for inpatient rooms apply.  Please refer to the [facility] website for the visitor guidelines for any additional information.   If you received a COVID test during your pre-op visit  it is requested that you wear a mask when out in public, stay away from anyone that may not be feeling well and notify your surgeon if you develop symptoms. If you have been in contact with anyone that has tested positive in the last 10 days please notify you surgeon.      Pre-operative 5 CHG Bathing Instructions   You can play a key role in reducing the risk of infection after surgery. Your skin needs to be as free of germs as possible. You can reduce the number of germs on your skin by washing with CHG (chlorhexidine gluconate) soap before surgery. CHG is an antiseptic soap that kills germs and continues to kill germs even after washing.   DO NOT use if you have an allergy to chlorhexidine/CHG or antibacterial soaps. If your skin becomes reddened or irritated, stop using the CHG and notify one of our RNs at [phone].  Please shower with the CHG soap starting 4 days before surgery using the following schedule:     Please keep in mind the following:  DO NOT shave, including legs and underarms, starting the day of your first shower.   Place clean sheets on your bed the day you start using CHG soap. Use a clean washcloth (not used since being washed) for each shower. DO NOT sleep with pets once you start using the CHG.   CHG Shower Instructions:  Wash your face and private area with normal soap. If you choose to wash  your hair, wash first with your normal shampoo.  After you use shampoo/soap, rinse your hair and body thoroughly to remove shampoo/soap residue.  Turn the water OFF and apply about 3 tablespoons (45 ml) of CHG soap to a CLEAN washcloth.  Apply CHG soap ONLY FROM YOUR NECK DOWN TO YOUR TOES (washing for 3-5 minutes)  DO NOT use CHG soap on face, private areas, open wounds, or sores.  Pay special attention to the area where your surgery is being performed.  If you are having back surgery, having someone wash your back for you may be helpful. Wait 2 minutes after CHG soap is applied, then you may rinse off the CHG soap.  Pat dry with a clean towel  Put on clean clothes/pajamas   If you choose to wear lotion, please use ONLY the CHG-compatible lotions that are listed below.  Additional instructions for the day of surgery: DO NOT APPLY any lotions, deodorants, or perfumes.   Do not bring valuables to the hospital. [facility] is not responsible for any belongings/valuables. Do not wear nail polish, gel polish, artificial nails, or any other type of covering on natural nails (fingers and toes) Do not wear jewelry or makeup Put on clean/comfortable clothes.  Please brush your teeth.  Ask your nurse before applying any prescription medications to the skin.     CHG Compatible Lotions   Aveeno Moisturizing lotion  Cetaphil Moisturizing Cream  Cetaphil Moisturizing Lotion  Clairol Herbal Essence Moisturizing Lotion, Dry Skin  Clairol Herbal Essence Moisturizing Lotion, Extra Dry Skin  Clairol Herbal Essence Moisturizing Lotion, Normal Skin  Curel Age Defying Therapeutic Moisturizing Lotion with Alpha Hydroxy  Curel Extreme Care Body Lotion  Curel Soothing Hands Moisturizing Hand Lotion  Curel Therapeutic Moisturizing Cream, Fragrance-Free  Curel Therapeutic Moisturizing Lotion, Fragrance-Free  Curel Therapeutic Moisturizing Lotion, Original Formula  Eucerin Daily Replenishing Lotion   Eucerin Dry Skin Therapy Plus Alpha Hydroxy Crme  Eucerin Dry Skin Therapy Plus Alpha Hydroxy Lotion  Eucerin Original Crme  Eucerin Original Lotion  Eucerin Plus Crme Eucerin Plus Lotion  Eucerin TriLipid Replenishing Lotion  Keri Anti-Bacterial Hand Lotion  Keri Deep Conditioning Original Lotion Dry Skin Formula Softly Scented  Keri Deep Conditioning Original Lotion, Fragrance Free Sensitive Skin Formula  Keri Lotion Fast Absorbing Fragrance Free Sensitive Skin Formula  Keri Lotion Fast Absorbing Softly Scented Dry Skin Formula  Keri Original Lotion  Keri Skin Renewal Lotion Keri Silky Smooth Lotion  Keri Silky Smooth Sensitive Skin Lotion  Nivea Body Creamy Conditioning Oil  Nivea Body Extra Enriched Lotion  Nivea Body Original Lotion  Nivea Body Sheer Moisturizing Lotion Nivea Crme  Nivea Skin Firming Lotion  NutraDerm 30 Skin Lotion  NutraDerm Skin Lotion  NutraDerm Therapeutic Skin Cream  NutraDerm Therapeutic Skin Lotion  ProShield Protective Hand Cream  Provon moisturizing lotion  Please read over the following fact sheets that you were given.

## 2023-05-08 ENCOUNTER — Encounter (HOSPITAL_COMMUNITY)
Admission: RE | Admit: 2023-05-08 | Discharge: 2023-05-08 | Disposition: A | Discharge status: Home / Self Care | Payer: BC Managed Care – PPO | Source: Ambulatory Visit | Attending: Neurosurgery | Admitting: Neurosurgery

## 2023-05-08 ENCOUNTER — Other Ambulatory Visit: Payer: Self-pay

## 2023-05-08 ENCOUNTER — Encounter (HOSPITAL_COMMUNITY): Payer: Self-pay

## 2023-05-08 VITALS — BP 118/77 | HR 86 | Temp 98.5°F | Resp 17 | Ht 64.5 in | Wt 185.0 lb

## 2023-05-08 DIAGNOSIS — Z8719 Personal history of other diseases of the digestive system: Secondary | ICD-10-CM | POA: Diagnosis not present

## 2023-05-08 DIAGNOSIS — Z01812 Encounter for preprocedural laboratory examination: Secondary | ICD-10-CM | POA: Insufficient documentation

## 2023-05-08 DIAGNOSIS — Z01818 Encounter for other preprocedural examination: Secondary | ICD-10-CM

## 2023-05-08 HISTORY — DX: Dyspnea, unspecified: R06.00

## 2023-05-08 LAB — CBC
HCT: 42 % (ref 36.0–46.0)
Hemoglobin: 13.6 g/dL (ref 12.0–15.0)
MCH: 26.9 pg (ref 26.0–34.0)
MCHC: 32.4 g/dL (ref 30.0–36.0)
MCV: 83.2 fL (ref 80.0–100.0)
Platelets: 276 10*3/uL (ref 150–400)
RBC: 5.05 MIL/uL (ref 3.87–5.11)
RDW: 13.5 % (ref 11.5–15.5)
WBC: 7.2 10*3/uL (ref 4.0–10.5)
nRBC: 0 % (ref 0.0–0.2)

## 2023-05-08 LAB — COMPREHENSIVE METABOLIC PANEL
ALT: 12 U/L (ref 0–44)
AST: 12 U/L — ABNORMAL LOW (ref 15–41)
Albumin: 3.9 g/dL (ref 3.5–5.0)
Alkaline Phosphatase: 57 U/L (ref 38–126)
Anion gap: 5 (ref 5–15)
BUN: 25 mg/dL — ABNORMAL HIGH (ref 6–20)
CO2: 28 mmol/L (ref 22–32)
Calcium: 9.2 mg/dL (ref 8.9–10.3)
Chloride: 102 mmol/L (ref 98–111)
Creatinine, Ser: 0.89 mg/dL (ref 0.44–1.00)
GFR, Estimated: >60 mL/min (ref >60)
Glucose, Bld: 99 mg/dL (ref 70–99)
Potassium: 3.5 mmol/L (ref 3.5–5.1)
Sodium: 135 mmol/L (ref 135–145)
Total Bilirubin: 0.6 mg/dL (ref 0.0–1.2)
Total Protein: 6.9 g/dL (ref 6.5–8.1)

## 2023-05-08 LAB — TYPE AND SCREEN
ABO/RH(D): B POS
Antibody Screen: NEGATIVE

## 2023-05-08 LAB — SURGICAL PCR SCREEN
MRSA, PCR: NEGATIVE
Staphylococcus aureus: NEGATIVE

## 2023-05-08 NOTE — Progress Notes (Signed)
 PCP - Gweneth Dimitri, MD Cardiologist - denies Gastroenterologist- Willis Modena, MD Urologist- Tommie Ard, MD  PPM/ICD - denies Device Orders - n/a Rep Notified - n/a  Chest x-ray - denies EKG - denies; n/a Stress Test - denies ECHO - denies Cardiac Cath - denies  Sleep Study - denies CPAP - n/a  Fasting Blood Sugar - no DM Checks Blood Sugar _____ times a day  Last dose of GLP1 agonist-  n/a GLP1 instructions: n/a  Blood Thinner Instructions: n/a Aspirin Instructions: n/a  ERAS Protcol -no; NPO PRE-SURGERY Ensure or G2- no  COVID TEST- n/a   Anesthesia review: no  Patient denies shortness of breath, fever, cough and chest pain at PAT appointment   All instructions explained to the patient, with a verbal understanding of the material. Patient agrees to go over the instructions while at home for a better understanding. Patient also instructed to self quarantine after being tested for COVID-19. The opportunity to ask questions was provided.

## 2023-05-11 DIAGNOSIS — M48061 Spinal stenosis, lumbar region without neurogenic claudication: Secondary | ICD-10-CM | POA: Diagnosis not present

## 2023-05-13 NOTE — Anesthesia Preprocedure Evaluation (Signed)
 Anesthesia Evaluation  Patient identified by MRN, date of birth, ID band Patient awake    Reviewed: Allergy & Precautions, NPO status , Patient's Chart, lab work & pertinent test results  History of Anesthesia Complications (+) PONV and history of anesthetic complications  Airway Mallampati: III  TM Distance: >3 FB Neck ROM: Full    Dental  (+) Teeth Intact, Dental Advisory Given   Pulmonary neg pulmonary ROS   Pulmonary exam normal breath sounds clear to auscultation       Cardiovascular Exercise Tolerance: Good negative cardio ROS Normal cardiovascular exam Rhythm:Regular Rate:Normal     Neuro/Psych  PSYCHIATRIC DISORDERS Anxiety Depression     SPNAL STENOSIS, LUMBAR REGION WITHOUT NEUROGENIC CLAUDICATION    GI/Hepatic ,GERD  ,,Chronic pancreatitis   Endo/Other  Hypothyroidism  Obesity   Renal/GU negative Renal ROS Bladder dysfunction      Musculoskeletal  (+) Arthritis ,    Abdominal   Peds  Hematology negative hematology ROS (+)   Anesthesia Other Findings   Reproductive/Obstetrics                             Anesthesia Physical Anesthesia Plan  ASA: 2  Anesthesia Plan: General   Post-op Pain Management: Tylenol PO (pre-op)*   Induction: Intravenous  PONV Risk Score and Plan: 4 or greater and Midazolam, Scopolamine patch - Pre-op, TIVA, Dexamethasone and Ondansetron  Airway Management Planned: Oral ETT  Additional Equipment:   Intra-op Plan:   Post-operative Plan: Extubation in OR  Informed Consent: I have reviewed the patients History and Physical, chart, labs and discussed the procedure including the risks, benefits and alternatives for the proposed anesthesia with the patient or authorized representative who has indicated his/her understanding and acceptance.     Dental advisory given  Plan Discussed with: CRNA  Anesthesia Plan Comments: (2nd PIV after  induction )        Anesthesia Quick Evaluation

## 2023-05-14 ENCOUNTER — Encounter (HOSPITAL_COMMUNITY)
Admission: RE | Disposition: A | Discharge status: Home / Self Care | Payer: Self-pay | Source: Home / Self Care | Attending: Neurosurgery

## 2023-05-14 ENCOUNTER — Other Ambulatory Visit: Payer: Self-pay

## 2023-05-14 ENCOUNTER — Inpatient Hospital Stay (HOSPITAL_COMMUNITY): Payer: Self-pay | Admitting: Anesthesiology

## 2023-05-14 ENCOUNTER — Inpatient Hospital Stay (HOSPITAL_COMMUNITY)

## 2023-05-14 ENCOUNTER — Encounter (HOSPITAL_COMMUNITY): Payer: Self-pay | Admitting: Neurosurgery

## 2023-05-14 ENCOUNTER — Inpatient Hospital Stay (HOSPITAL_COMMUNITY)
Admission: RE | Admit: 2023-05-14 | Discharge: 2023-05-15 | DRG: 448 | Disposition: A | Discharge status: Home / Self Care | Payer: BC Managed Care – PPO | Attending: Neurosurgery | Admitting: Neurosurgery

## 2023-05-14 DIAGNOSIS — M48061 Spinal stenosis, lumbar region without neurogenic claudication: Secondary | ICD-10-CM | POA: Diagnosis present

## 2023-05-14 DIAGNOSIS — Z79890 Hormone replacement therapy (postmenopausal): Secondary | ICD-10-CM

## 2023-05-14 DIAGNOSIS — M51369 Other intervertebral disc degeneration, lumbar region without mention of lumbar back pain or lower extremity pain: Principal | ICD-10-CM | POA: Diagnosis present

## 2023-05-14 DIAGNOSIS — F419 Anxiety disorder, unspecified: Secondary | ICD-10-CM | POA: Diagnosis present

## 2023-05-14 DIAGNOSIS — Z6831 Body mass index (BMI) 31.0-31.9, adult: Secondary | ICD-10-CM

## 2023-05-14 DIAGNOSIS — Z90721 Acquired absence of ovaries, unilateral: Secondary | ICD-10-CM

## 2023-05-14 DIAGNOSIS — Z981 Arthrodesis status: Secondary | ICD-10-CM | POA: Diagnosis not present

## 2023-05-14 DIAGNOSIS — M51362 Other intervertebral disc degeneration, lumbar region with discogenic back pain and lower extremity pain: Principal | ICD-10-CM | POA: Diagnosis present

## 2023-05-14 DIAGNOSIS — M5136 Other intervertebral disc degeneration, lumbar region with discogenic back pain only: Secondary | ICD-10-CM | POA: Diagnosis not present

## 2023-05-14 DIAGNOSIS — Z79899 Other long term (current) drug therapy: Secondary | ICD-10-CM | POA: Diagnosis not present

## 2023-05-14 DIAGNOSIS — K219 Gastro-esophageal reflux disease without esophagitis: Secondary | ICD-10-CM | POA: Diagnosis present

## 2023-05-14 DIAGNOSIS — Z9079 Acquired absence of other genital organ(s): Secondary | ICD-10-CM | POA: Diagnosis not present

## 2023-05-14 DIAGNOSIS — Z888 Allergy status to other drugs, medicaments and biological substances status: Secondary | ICD-10-CM

## 2023-05-14 DIAGNOSIS — Z87442 Personal history of urinary calculi: Secondary | ICD-10-CM | POA: Diagnosis not present

## 2023-05-14 DIAGNOSIS — Z96653 Presence of artificial knee joint, bilateral: Secondary | ICD-10-CM | POA: Diagnosis not present

## 2023-05-14 DIAGNOSIS — K861 Other chronic pancreatitis: Secondary | ICD-10-CM | POA: Diagnosis not present

## 2023-05-14 DIAGNOSIS — E669 Obesity, unspecified: Secondary | ICD-10-CM | POA: Diagnosis not present

## 2023-05-14 DIAGNOSIS — E039 Hypothyroidism, unspecified: Secondary | ICD-10-CM | POA: Diagnosis not present

## 2023-05-14 DIAGNOSIS — Z8616 Personal history of COVID-19: Secondary | ICD-10-CM | POA: Diagnosis not present

## 2023-05-14 DIAGNOSIS — G8929 Other chronic pain: Secondary | ICD-10-CM | POA: Diagnosis not present

## 2023-05-14 DIAGNOSIS — Z90710 Acquired absence of both cervix and uterus: Secondary | ICD-10-CM

## 2023-05-14 DIAGNOSIS — Z9689 Presence of other specified functional implants: Secondary | ICD-10-CM | POA: Diagnosis not present

## 2023-05-14 DIAGNOSIS — F32A Depression, unspecified: Secondary | ICD-10-CM | POA: Diagnosis present

## 2023-05-14 HISTORY — PX: LUMBAR PERCUTANEOUS PEDICLE SCREW 2 LEVEL: SHX5561

## 2023-05-14 SURGERY — LUMBAR PERCUTANEOUS PEDICLE SCREW 2 LEVEL
Anesthesia: General | Site: Spine Lumbar

## 2023-05-14 MED ORDER — BUPROPION HCL ER (XL) 300 MG PO TB24
300.0000 mg | ORAL_TABLET | Freq: Every morning | ORAL | Status: DC
Start: 2023-05-15 — End: 2023-05-15

## 2023-05-14 MED ORDER — MIDAZOLAM HCL 2 MG/2ML IJ SOLN
INTRAMUSCULAR | Status: AC
  Filled 2023-05-14: qty 2

## 2023-05-14 MED ORDER — PROPOFOL 10 MG/ML IV BOLUS
INTRAVENOUS | Status: DC | PRN
  Administered 2023-05-14: 140 mg via INTRAVENOUS

## 2023-05-14 MED ORDER — MENTHOL 3 MG MT LOZG: 1.0000 | LOZENGE | OROMUCOSAL | Status: DC | PRN

## 2023-05-14 MED ORDER — PROPOFOL 1000 MG/100ML IV EMUL
INTRAVENOUS | Status: AC
  Filled 2023-05-14: qty 100

## 2023-05-14 MED ORDER — PROMETHAZINE HCL 25 MG PO TABS
25.0000 mg | ORAL_TABLET | Freq: Three times a day (TID) | ORAL | Status: DC | PRN
  Administered 2023-05-14 – 2023-05-15 (×2): 25 mg via ORAL
  Filled 2023-05-14 (×2): qty 1

## 2023-05-14 MED ORDER — PROMETHAZINE (PHENERGAN) 6.25MG IN NS 50ML IVPB
6.2500 mg | Freq: Once | INTRAVENOUS | Status: DC
  Filled 2023-05-14: qty 50

## 2023-05-14 MED ORDER — THROMBIN 5000 UNITS EX SOLR: OROMUCOSAL | Status: DC | PRN

## 2023-05-14 MED ORDER — IPRATROPIUM-ALBUTEROL 0.5-2.5 (3) MG/3ML IN SOLN: 3.0000 mL | Freq: Four times a day (QID) | RESPIRATORY_TRACT | Status: DC | PRN

## 2023-05-14 MED ORDER — PHENYLEPHRINE 80 MCG/ML (10ML) SYRINGE FOR IV PUSH (FOR BLOOD PRESSURE SUPPORT)
PREFILLED_SYRINGE | INTRAVENOUS | Status: AC
  Filled 2023-05-14: qty 10

## 2023-05-14 MED ORDER — PANTOPRAZOLE SODIUM 40 MG PO TBEC
40.0000 mg | DELAYED_RELEASE_TABLET | Freq: Every day | ORAL | Status: DC
  Administered 2023-05-14: 40 mg via ORAL
  Filled 2023-05-14: qty 1

## 2023-05-14 MED ORDER — NITROFURANTOIN MONOHYD MACRO 100 MG PO CAPS
100.0000 mg | ORAL_CAPSULE | Freq: Every evening | ORAL | Status: DC
  Administered 2023-05-14: 100 mg via ORAL
  Filled 2023-05-14: qty 1

## 2023-05-14 MED ORDER — PHENOL 1.4 % MT LIQD: 1.0000 | OROMUCOSAL | Status: DC | PRN

## 2023-05-14 MED ORDER — KETAMINE HCL 50 MG/5ML IJ SOSY
PREFILLED_SYRINGE | INTRAMUSCULAR | Status: AC
  Filled 2023-05-14: qty 5

## 2023-05-14 MED ORDER — CHLORHEXIDINE GLUCONATE CLOTH 2 % EX PADS: 6.0000 | MEDICATED_PAD | Freq: Once | CUTANEOUS | Status: DC

## 2023-05-14 MED ORDER — ONDANSETRON HCL 4 MG/2ML IJ SOLN
INTRAMUSCULAR | Status: DC | PRN
  Administered 2023-05-14: 4 mg via INTRAVENOUS

## 2023-05-14 MED ORDER — SUGAMMADEX SODIUM 200 MG/2ML IV SOLN
INTRAVENOUS | Status: AC
  Filled 2023-05-14: qty 2

## 2023-05-14 MED ORDER — LIDOCAINE 2% (20 MG/ML) 5 ML SYRINGE
INTRAMUSCULAR | Status: AC
  Filled 2023-05-14: qty 5

## 2023-05-14 MED ORDER — GLYCOPYRROLATE PF 0.2 MG/ML IJ SOSY
PREFILLED_SYRINGE | INTRAMUSCULAR | Status: AC
  Filled 2023-05-14: qty 2

## 2023-05-14 MED ORDER — PHENYLEPHRINE 80 MCG/ML (10ML) SYRINGE FOR IV PUSH (FOR BLOOD PRESSURE SUPPORT)
PREFILLED_SYRINGE | INTRAVENOUS | Status: DC | PRN
  Administered 2023-05-14: 160 ug via INTRAVENOUS

## 2023-05-14 MED ORDER — MORPHINE SULFATE (PF) 2 MG/ML IV SOLN
2.0000 mg | INTRAVENOUS | Status: DC | PRN
  Administered 2023-05-14 (×2): 4 mg via INTRAVENOUS
  Filled 2023-05-14 (×2): qty 2

## 2023-05-14 MED ORDER — CEFAZOLIN SODIUM-DEXTROSE 2-4 GM/100ML-% IV SOLN
2.0000 g | INTRAVENOUS | Status: AC
  Administered 2023-05-14: 2 g via INTRAVENOUS
  Filled 2023-05-14: qty 100

## 2023-05-14 MED ORDER — AMISULPRIDE (ANTIEMETIC) 5 MG/2ML IV SOLN
INTRAVENOUS | Status: AC
Start: 2023-05-14 — End: 2023-05-15
  Filled 2023-05-14: qty 4

## 2023-05-14 MED ORDER — ORAL CARE MOUTH RINSE: 15.0000 mL | Freq: Once | OROMUCOSAL | Status: AC

## 2023-05-14 MED ORDER — EPHEDRINE 5 MG/ML INJ
INTRAVENOUS | Status: AC
  Filled 2023-05-14: qty 5

## 2023-05-14 MED ORDER — PROPOFOL 10 MG/ML IV BOLUS
INTRAVENOUS | Status: AC
  Filled 2023-05-14: qty 20

## 2023-05-14 MED ORDER — SODIUM CHLORIDE 0.9% FLUSH
3.0000 mL | Freq: Two times a day (BID) | INTRAVENOUS | Status: DC
Start: 2023-05-14 — End: 2023-05-15
  Administered 2023-05-14 (×2): 3 mL via INTRAVENOUS

## 2023-05-14 MED ORDER — FLUTICASONE PROPIONATE 50 MCG/ACT NA SUSP
1.0000 | Freq: Two times a day (BID) | NASAL | Status: DC
  Filled 2023-05-14: qty 16

## 2023-05-14 MED ORDER — HYDROMORPHONE HCL 1 MG/ML IJ SOLN
INTRAMUSCULAR | Status: AC
Start: 2023-05-14 — End: 2023-05-15
  Filled 2023-05-14: qty 1

## 2023-05-14 MED ORDER — ONDANSETRON HCL 4 MG/2ML IJ SOLN
INTRAMUSCULAR | Status: AC
  Filled 2023-05-14: qty 2

## 2023-05-14 MED ORDER — BUPIVACAINE-EPINEPHRINE (PF) 0.5% -1:200000 IJ SOLN
INTRAMUSCULAR | Status: AC
  Filled 2023-05-14: qty 30

## 2023-05-14 MED ORDER — ACETAMINOPHEN 500 MG PO TABS
1000.0000 mg | ORAL_TABLET | Freq: Once | ORAL | Status: AC
  Administered 2023-05-14: 1000 mg via ORAL
  Filled 2023-05-14: qty 2

## 2023-05-14 MED ORDER — HYDROMORPHONE HCL 1 MG/ML IJ SOLN
INTRAMUSCULAR | Status: AC
  Filled 2023-05-14: qty 1

## 2023-05-14 MED ORDER — MAGNESIUM SULFATE 50 % IJ SOLN
INTRAMUSCULAR | Status: DC | PRN
  Administered 2023-05-14: 2 g via INTRAVENOUS

## 2023-05-14 MED ORDER — HYDROMORPHONE HCL 1 MG/ML IJ SOLN
0.2500 mg | INTRAMUSCULAR | Status: DC | PRN
  Administered 2023-05-14 (×4): 0.5 mg via INTRAVENOUS

## 2023-05-14 MED ORDER — URIBEL 118 MG PO CAPS: 118.0000 mg | ORAL_CAPSULE | Freq: Four times a day (QID) | ORAL | Status: DC

## 2023-05-14 MED ORDER — DULOXETINE HCL 60 MG PO CPEP: 120.0000 mg | ORAL_CAPSULE | Freq: Every day | ORAL | Status: DC

## 2023-05-14 MED ORDER — ALBUTEROL SULFATE HFA 108 (90 BASE) MCG/ACT IN AERS: 1.0000 | INHALATION_SPRAY | RESPIRATORY_TRACT | Status: DC | PRN

## 2023-05-14 MED ORDER — SENNA 8.6 MG PO TABS
1.0000 | ORAL_TABLET | Freq: Two times a day (BID) | ORAL | Status: DC | PRN
  Administered 2023-05-14: 8.6 mg via ORAL
  Filled 2023-05-14: qty 1

## 2023-05-14 MED ORDER — ALPRAZOLAM 0.5 MG PO TABS
1.0000 mg | ORAL_TABLET | Freq: Three times a day (TID) | ORAL | Status: DC
  Administered 2023-05-14 (×2): 1 mg via ORAL
  Filled 2023-05-14 (×2): qty 2

## 2023-05-14 MED ORDER — KETAMINE HCL 10 MG/ML IJ SOLN
INTRAMUSCULAR | Status: DC | PRN
  Administered 2023-05-14: 40 mg via INTRAVENOUS
  Administered 2023-05-14: 10 mg via INTRAVENOUS

## 2023-05-14 MED ORDER — PHENYLEPHRINE HCL-NACL 20-0.9 MG/250ML-% IV SOLN
INTRAVENOUS | Status: DC | PRN
  Administered 2023-05-14: 50 ug/min via INTRAVENOUS

## 2023-05-14 MED ORDER — BISACODYL 10 MG RE SUPP: 10.0000 mg | Freq: Every day | RECTAL | Status: DC | PRN

## 2023-05-14 MED ORDER — EPHEDRINE SULFATE-NACL 50-0.9 MG/10ML-% IV SOSY
PREFILLED_SYRINGE | INTRAVENOUS | Status: DC | PRN
  Administered 2023-05-14 (×2): 10 mg via INTRAVENOUS
  Administered 2023-05-14 (×2): 5 mg via INTRAVENOUS

## 2023-05-14 MED ORDER — BUPIVACAINE-EPINEPHRINE (PF) 0.5% -1:200000 IJ SOLN
INTRAMUSCULAR | Status: DC | PRN
  Administered 2023-05-14 (×2): 10 mL

## 2023-05-14 MED ORDER — SUGAMMADEX SODIUM 200 MG/2ML IV SOLN
INTRAVENOUS | Status: DC | PRN
  Administered 2023-05-14: 350 mg via INTRAVENOUS

## 2023-05-14 MED ORDER — GLYCOPYRROLATE PF 0.2 MG/ML IJ SOSY
PREFILLED_SYRINGE | INTRAMUSCULAR | Status: DC | PRN
  Administered 2023-05-14 (×2): .2 mg via INTRAVENOUS

## 2023-05-14 MED ORDER — METHADONE HCL IV SYRINGE 10 MG/ML FOR CABG
10.0000 mg | Freq: Once | INTRAMUSCULAR | Status: AC
  Administered 2023-05-14: 10 mg via INTRAVENOUS
  Filled 2023-05-14: qty 1

## 2023-05-14 MED ORDER — ACETAMINOPHEN 10 MG/ML IV SOLN: 1000.0000 mg | Freq: Once | INTRAVENOUS | Status: AC

## 2023-05-14 MED ORDER — PROPOFOL 500 MG/50ML IV EMUL
INTRAVENOUS | Status: DC | PRN
  Administered 2023-05-14: 90 ug/kg/min via INTRAVENOUS

## 2023-05-14 MED ORDER — CYCLOBENZAPRINE HCL 10 MG PO TABS
10.0000 mg | ORAL_TABLET | Freq: Three times a day (TID) | ORAL | Status: DC | PRN
  Administered 2023-05-14 – 2023-05-15 (×2): 10 mg via ORAL
  Filled 2023-05-14 (×2): qty 1

## 2023-05-14 MED ORDER — CEFAZOLIN SODIUM-DEXTROSE 2-4 GM/100ML-% IV SOLN
2.0000 g | Freq: Three times a day (TID) | INTRAVENOUS | Status: AC
  Administered 2023-05-14 (×2): 2 g via INTRAVENOUS
  Filled 2023-05-14 (×2): qty 100

## 2023-05-14 MED ORDER — MIDAZOLAM HCL 2 MG/2ML IJ SOLN
INTRAMUSCULAR | Status: DC | PRN
  Administered 2023-05-14: 2 mg via INTRAVENOUS

## 2023-05-14 MED ORDER — ACETAMINOPHEN 325 MG PO TABS: 650.0000 mg | ORAL_TABLET | ORAL | Status: DC | PRN

## 2023-05-14 MED ORDER — ROCURONIUM BROMIDE 10 MG/ML (PF) SYRINGE
PREFILLED_SYRINGE | INTRAVENOUS | Status: DC | PRN
  Administered 2023-05-14: 70 mg via INTRAVENOUS
  Administered 2023-05-14: 25 mg via INTRAVENOUS
  Administered 2023-05-14: 15 mg via INTRAVENOUS
  Administered 2023-05-14: 30 mg via INTRAVENOUS

## 2023-05-14 MED ORDER — PANCRELIPASE (LIP-PROT-AMYL) 36000-114000 UNITS PO CPEP
72000.0000 [IU] | ORAL_CAPSULE | Freq: Three times a day (TID) | ORAL | Status: DC
  Administered 2023-05-14 – 2023-05-15 (×2): 72000 [IU] via ORAL
  Filled 2023-05-14 (×3): qty 2

## 2023-05-14 MED ORDER — 0.9 % SODIUM CHLORIDE (POUR BTL) OPTIME
TOPICAL | Status: DC | PRN
  Administered 2023-05-14: 1000 mL

## 2023-05-14 MED ORDER — BACITRACIN ZINC 500 UNIT/GM EX OINT
TOPICAL_OINTMENT | CUTANEOUS | Status: AC
  Filled 2023-05-14: qty 28.35

## 2023-05-14 MED ORDER — LACTATED RINGERS IV SOLN: INTRAVENOUS | Status: DC

## 2023-05-14 MED ORDER — TAMSULOSIN HCL 0.4 MG PO CAPS
0.4000 mg | ORAL_CAPSULE | Freq: Every day | ORAL | Status: DC
  Administered 2023-05-14: 0.4 mg via ORAL
  Filled 2023-05-14: qty 1

## 2023-05-14 MED ORDER — SCOPOLAMINE 1 MG/3DAYS TD PT72
1.0000 | MEDICATED_PATCH | Freq: Once | TRANSDERMAL | Status: DC
  Administered 2023-05-14: 1.5 mg via TRANSDERMAL
  Filled 2023-05-14: qty 1

## 2023-05-14 MED ORDER — BUPIVACAINE LIPOSOME 1.3 % IJ SUSP
INTRAMUSCULAR | Status: AC
Start: 2023-05-14 — End: ?
  Filled 2023-05-14: qty 20

## 2023-05-14 MED ORDER — THROMBIN 5000 UNITS EX KIT
PACK | CUTANEOUS | Status: AC
  Filled 2023-05-14: qty 1

## 2023-05-14 MED ORDER — SODIUM CHLORIDE 0.9% FLUSH: 3.0000 mL | INTRAVENOUS | Status: DC | PRN

## 2023-05-14 MED ORDER — OXYCODONE HCL 5 MG PO TABS
15.0000 mg | ORAL_TABLET | ORAL | Status: DC | PRN
  Administered 2023-05-14 – 2023-05-15 (×5): 15 mg via ORAL
  Filled 2023-05-14 (×5): qty 3

## 2023-05-14 MED ORDER — ROCURONIUM BROMIDE 10 MG/ML (PF) SYRINGE
PREFILLED_SYRINGE | INTRAVENOUS | Status: AC
Start: 2023-05-14 — End: ?
  Filled 2023-05-14: qty 10

## 2023-05-14 MED ORDER — ROCURONIUM BROMIDE 10 MG/ML (PF) SYRINGE
PREFILLED_SYRINGE | INTRAVENOUS | Status: AC
  Filled 2023-05-14: qty 10

## 2023-05-14 MED ORDER — SODIUM CHLORIDE 0.9 % IV SOLN
250.0000 mL | INTRAVENOUS | Status: DC
  Administered 2023-05-14: 250 mL via INTRAVENOUS

## 2023-05-14 MED ORDER — ACETAMINOPHEN 650 MG RE SUPP: 650.0000 mg | RECTAL | Status: DC | PRN

## 2023-05-14 MED ORDER — ACETAMINOPHEN 500 MG PO TABS
1000.0000 mg | ORAL_TABLET | Freq: Four times a day (QID) | ORAL | Status: DC
  Administered 2023-05-14 – 2023-05-15 (×3): 1000 mg via ORAL
  Filled 2023-05-14 (×3): qty 2

## 2023-05-14 MED ORDER — GABAPENTIN 300 MG PO CAPS
300.0000 mg | ORAL_CAPSULE | Freq: Once | ORAL | Status: AC
  Administered 2023-05-14: 300 mg via ORAL
  Filled 2023-05-14: qty 1

## 2023-05-14 MED ORDER — LEVOTHYROXINE SODIUM 137 MCG PO TABS
137.0000 ug | ORAL_TABLET | Freq: Every day | ORAL | Status: DC
  Administered 2023-05-15: 137 ug via ORAL
  Filled 2023-05-14: qty 1

## 2023-05-14 MED ORDER — ONDANSETRON HCL 4 MG/2ML IJ SOLN
4.0000 mg | Freq: Once | INTRAMUSCULAR | Status: AC | PRN
  Administered 2023-05-14: 4 mg via INTRAVENOUS

## 2023-05-14 MED ORDER — FENTANYL CITRATE (PF) 250 MCG/5ML IJ SOLN
INTRAMUSCULAR | Status: AC
  Filled 2023-05-14: qty 5

## 2023-05-14 MED ORDER — DOCUSATE SODIUM 100 MG PO CAPS
100.0000 mg | ORAL_CAPSULE | Freq: Two times a day (BID) | ORAL | Status: DC
  Administered 2023-05-14: 100 mg via ORAL
  Filled 2023-05-14: qty 1

## 2023-05-14 MED ORDER — CHLORHEXIDINE GLUCONATE 0.12 % MT SOLN
15.0000 mL | Freq: Once | OROMUCOSAL | Status: AC
  Administered 2023-05-14: 15 mL via OROMUCOSAL
  Filled 2023-05-14: qty 15

## 2023-05-14 MED ORDER — PREGABALIN 75 MG PO CAPS
150.0000 mg | ORAL_CAPSULE | Freq: Two times a day (BID) | ORAL | Status: DC
  Administered 2023-05-14: 150 mg via ORAL
  Filled 2023-05-14: qty 2

## 2023-05-14 MED ORDER — ACETAMINOPHEN 10 MG/ML IV SOLN
INTRAVENOUS | Status: AC
  Administered 2023-05-14: 1000 mg via INTRAVENOUS
  Filled 2023-05-14: qty 100

## 2023-05-14 MED ORDER — HYDROCHLOROTHIAZIDE 25 MG PO TABS
25.0000 mg | ORAL_TABLET | Freq: Every day | ORAL | Status: DC
  Administered 2023-05-14: 25 mg via ORAL
  Filled 2023-05-14: qty 1

## 2023-05-14 MED ORDER — ONDANSETRON HCL 4 MG PO TABS: 4.0000 mg | ORAL_TABLET | Freq: Four times a day (QID) | ORAL | Status: DC | PRN

## 2023-05-14 MED ORDER — LORATADINE 10 MG PO TABS
10.0000 mg | ORAL_TABLET | Freq: Every day | ORAL | Status: DC
  Administered 2023-05-14: 10 mg via ORAL
  Filled 2023-05-14: qty 1

## 2023-05-14 MED ORDER — DULOXETINE HCL 60 MG PO CPEP
60.0000 mg | ORAL_CAPSULE | Freq: Two times a day (BID) | ORAL | Status: DC
  Administered 2023-05-14: 60 mg via ORAL
  Filled 2023-05-14: qty 1

## 2023-05-14 MED ORDER — ONDANSETRON HCL 4 MG/2ML IJ SOLN: 4.0000 mg | Freq: Four times a day (QID) | INTRAMUSCULAR | Status: DC | PRN

## 2023-05-14 MED ORDER — AMISULPRIDE (ANTIEMETIC) 5 MG/2ML IV SOLN
10.0000 mg | Freq: Once | INTRAVENOUS | Status: AC | PRN
  Administered 2023-05-14: 10 mg via INTRAVENOUS

## 2023-05-14 MED ORDER — LIDOCAINE 2% (20 MG/ML) 5 ML SYRINGE
INTRAMUSCULAR | Status: DC | PRN
  Administered 2023-05-14: 100 mg via INTRAVENOUS

## 2023-05-14 SURGICAL SUPPLY — 68 items
BAG COUNTER SPONGE SURGICOUNT (BAG) ×2 IMPLANT
BASKET BONE COLLECTION (BASKET) ×2 IMPLANT
BENZOIN TINCTURE PRP APPL 2/3 (GAUZE/BANDAGES/DRESSINGS) ×2 IMPLANT
BLADE CLIPPER SURG (BLADE) IMPLANT
BUR 14 MATCH 3 (BUR) IMPLANT
BUR MATCHSTICK NEURO 3.0 LAGG (BURR) ×2 IMPLANT
BUR MR8 14 BALL 5 (BUR) IMPLANT
BUR PRECISION FLUTE 6.0 (BURR) ×2 IMPLANT
BURR 14 MATCH 3 (BUR) IMPLANT
BURR MR8 14 BALL 5 (BUR) IMPLANT
CANISTER SUCT 3000ML PPV (MISCELLANEOUS) ×2 IMPLANT
CLEANSER WND VASHE INSTL 34OZ (WOUND CARE) ×2 IMPLANT
CNTNR URN SCR LID CUP LEK RST (MISCELLANEOUS) ×2 IMPLANT
COVER BACK TABLE 60X90IN (DRAPES) ×2 IMPLANT
COVERAGE SUPPORT O-ARM STEALTH (MISCELLANEOUS) ×2 IMPLANT
DRAPE C-ARM 42X72 X-RAY (DRAPES) ×4 IMPLANT
DRAPE HALF SHEET 40X57 (DRAPES) ×2 IMPLANT
DRAPE LAPAROTOMY 100X72X124 (DRAPES) ×2 IMPLANT
DRAPE SHEET LG 3/4 BI-LAMINATE (DRAPES) ×8 IMPLANT
DRAPE SURG 17X23 STRL (DRAPES) ×2 IMPLANT
DRSG OPSITE POSTOP 3X4 (GAUZE/BANDAGES/DRESSINGS) IMPLANT
DRSG OPSITE POSTOP 4X6 (GAUZE/BANDAGES/DRESSINGS) ×2 IMPLANT
ELECT BLADE 4.0 EZ CLEAN MEGAD (MISCELLANEOUS) ×2 IMPLANT
ELECT REM PT RETURN 9FT ADLT (ELECTROSURGICAL) ×2 IMPLANT
ELECTRODE BLDE 4.0 EZ CLN MEGD (MISCELLANEOUS) ×2 IMPLANT
ELECTRODE REM PT RTRN 9FT ADLT (ELECTROSURGICAL) ×2 IMPLANT
FEE COVERAGE SUPPORT O-ARM (MISCELLANEOUS) ×2 IMPLANT
GAUZE 4X4 16PLY ~~LOC~~+RFID DBL (SPONGE) ×2 IMPLANT
GLOVE BIO SURGEON STRL SZ 6 (GLOVE) ×2 IMPLANT
GLOVE BIO SURGEON STRL SZ8 (GLOVE) ×6 IMPLANT
GLOVE BIO SURGEON STRL SZ8.5 (GLOVE) ×6 IMPLANT
GLOVE BIOGEL PI IND STRL 6.5 (GLOVE) ×2 IMPLANT
GLOVE EXAM NITRILE XL STR (GLOVE) IMPLANT
GOWN STRL REUS W/ TWL LRG LVL3 (GOWN DISPOSABLE) ×2 IMPLANT
GOWN STRL REUS W/ TWL XL LVL3 (GOWN DISPOSABLE) ×4 IMPLANT
GOWN STRL REUS W/TWL 2XL LVL3 (GOWN DISPOSABLE) IMPLANT
HEMOSTAT POWDER KIT SURGIFOAM (HEMOSTASIS) ×2 IMPLANT
KIT BASIN OR (CUSTOM PROCEDURE TRAY) ×2 IMPLANT
KIT GRAFTMAG DEL NEURO DISP (NEUROSURGERY SUPPLIES) IMPLANT
KIT POSITION SURG JACKSON T1 (MISCELLANEOUS) ×2 IMPLANT
KIT TURNOVER KIT B (KITS) ×2 IMPLANT
MARKER SPHERE PSV REFLC NDI (MISCELLANEOUS) ×10 IMPLANT
NDL HYPO 21X1.5 SAFETY (NEEDLE) ×2 IMPLANT
NDL HYPO 22X1.5 SAFETY MO (MISCELLANEOUS) ×2 IMPLANT
NEEDLE HYPO 21X1.5 SAFETY (NEEDLE) ×2 IMPLANT
NEEDLE HYPO 22X1.5 SAFETY MO (MISCELLANEOUS) ×2 IMPLANT
NS IRRIG 1000ML POUR BTL (IV SOLUTION) ×2 IMPLANT
PACK LAMINECTOMY NEURO (CUSTOM PROCEDURE TRAY) ×2 IMPLANT
PAD ARMBOARD 7.5X6 YLW CONV (MISCELLANEOUS) ×6 IMPLANT
PATTIES SURGICAL .5 X1 (DISPOSABLE) IMPLANT
PIN BONE FIX 100 (PIN) IMPLANT
PUTTY DBM 10CC CALC GRAN (Putty) IMPLANT
ROD RELINE MAS LORD 5.5X75MM (Rod) IMPLANT
ROD RELINE TI MAS LD 5.5X65 (Rod) IMPLANT
SCREW LOCK RELINE 5.5 TULIP (Screw) IMPLANT
SCREW RELINE RED 6.5X50MM POLY (Screw) IMPLANT
SPIKE FLUID TRANSFER (MISCELLANEOUS) ×2 IMPLANT
SPONGE NEURO XRAY DETECT 1X3 (DISPOSABLE) IMPLANT
SPONGE SURGIFOAM ABS GEL 100 (HEMOSTASIS) IMPLANT
SPONGE T-LAP 4X18 ~~LOC~~+RFID (SPONGE) IMPLANT
STRIP CLOSURE SKIN 1/2X4 (GAUZE/BANDAGES/DRESSINGS) ×2 IMPLANT
SUT VIC AB 1 CT1 18XBRD ANBCTR (SUTURE) ×4 IMPLANT
SUT VIC AB 2-0 CP2 18 (SUTURE) ×4 IMPLANT
SYR 20ML LL LF (SYRINGE) IMPLANT
TOWEL GREEN STERILE (TOWEL DISPOSABLE) ×2 IMPLANT
TOWEL GREEN STERILE FF (TOWEL DISPOSABLE) ×2 IMPLANT
TRAY FOLEY MTR SLVR 16FR STAT (SET/KITS/TRAYS/PACK) ×2 IMPLANT
WATER STERILE IRR 1000ML POUR (IV SOLUTION) ×2 IMPLANT

## 2023-05-14 NOTE — H&P (Signed)
 Subjective: The patient is a 58 year old white female whose had several back surgeries.  I performed an L2-3 and L3-4 XLIF on her.  She has had some persistent back pain.  We discussed a posterior instrumentation and fusion.  She has decided proceed with surgery.  Past Medical History:  Diagnosis Date   Anemia    Anxiety    Bronchitis    hx of    Cervical pain (neck)    Chronic pancreatitis (HCC)    Closed fracture of base of fifth metatarsal bone of right foot at metaphyseal-diaphyseal junction    Closed fracture of left lateral malleolus 12/29/2017   saw podiatry at [facility] and was  prescribed bone growth stimulator device to use before she has knee surgery    Constipation    DDD (degenerative disc disease), cervical    Depression    Dyspnea    GERD (gastroesophageal reflux disease)    History of kidney stones    Pt takes Hctz to assist with preventing   History of pancreatitis 01/04/2011   Hypothyroidism    IBS (irritable bowel syndrome)    Interstitial cystitis    Nausea DUE TO PAIN FROM KIDNEY STONE   Pancreatic pseudocyst    Pneumonia    hx of COVID pneumonia   PONV (postoperative nausea and vomiting)    Right flank pain    Right knee pain    Right ureteral stone    Swelling of right knee joint    Urinary tract infection    hx of     Past Surgical History:  Procedure Laterality Date   ANAL RECTAL MANOMETRY N/A 09/04/2016   Procedure: ANO RECTAL MANOMETRY;  Surgeon: Willis Modena, MD;  Location: [facility];  Service: Endoscopy;  Laterality: N/A;   ANTERIOR LAT LUMBAR FUSION Left 10/26/2022   Procedure: ANTERIOR LATERAL LUMBAR INTERBODY FUSION, INTERBODY PROSTHESIS,INSTRUMENTATION, LUMBAR TWO-THREE, LUMBAR THREE-FOUR;  Surgeon: Tressie Stalker, MD;  Location: [facility] OR;  Service: Neurosurgery;  Laterality: Left;  3C   ANTERIOR LUMBAR FUSION  11/09/2010   L5 - S1   BACK SURGERY  03/13/2010   lower back surgery    CERVICAL FUSION  03/13/1997   C4 - 7   CERVICAL  SPINE SURGERY  06/11/2009   REMOVAL SCAR TISSUE   colonscopy      CYSTO/ RIGHT RETROGRADE PYELOGRAM/ RIGHT URETEROSCOPY AND STENT PLACEMENT  09/24/2009   HX RIGHT URETERAL CALCULUS   CYSTOSCOPY WITH URETEROSCOPY  12/20/2011   Procedure: CYSTOSCOPY WITH URETEROSCOPY;  Surgeon: Milford Cage, MD;  Location: [facility];  Service: Urology;  Laterality: Right;   DIAGNOSTIC LAPAROSCOPY  03/13/1990   ENDOMETRIOSIS   EUS  03/01/2011   Procedure: UPPER ENDOSCOPIC ULTRASOUND (EUS) LINEAR;  Surgeon: Freddy Jaksch, MD;  Location: [facility];  Service: Endoscopy;  Laterality: N/A;   FINE NEEDLE ASPIRATION  03/01/2011   Procedure: FINE NEEDLE ASPIRATION (FNA) LINEAR;  Surgeon: Freddy Jaksch, MD;  Location: [facility];  Service: Endoscopy;  Laterality: N/A;   HERNIA REPAIR  AS CHILD   JOINT REPLACEMENT     KNEE ARTHROSCOPY  10/12/2011   RIGHT KNEE   LAPAROSCOPIC ASSISTED VAGINAL HYSTERECTOMY  03/14/1995   LAPAROSCOPIC CHOLECYSTECTOMY  01/09/2011   ROBOTIC-ASSISTED RIGHT SALPINGO-OOPHORECTOMY W/ LYSIS ADHESIONS  12/21/2009   HX ENDOMETRIOSIS AND PELVIC PAIN   TONSILLECTOMY  AS CHILD   TOTAL KNEE ARTHROPLASTY Right 01/13/2014   Procedure: RIGHT TOTAL KNEE ARTHROPLASTY;  Surgeon: Shelda Pal, MD;  Location: [facility];  Service: Orthopedics;  Laterality: Right;   TOTAL KNEE ARTHROPLASTY Left 12/13/2015   Procedure: LEFT TOTAL KNEE ARTHROPLASTY;  Surgeon: Ollen Gross, MD;  Location: [facility];  Service: Orthopedics;  Laterality: Left;   TOTAL KNEE REVISION Right 08/31/2014   Procedure: RIGHT TOTAL KNEE ARTHROPLASTY RESECTION;  Surgeon: Durene Romans, MD;  Location: [facility];  Service: Orthopedics;  Laterality: Right;   TOTAL KNEE REVISION Right 01/09/2018   Procedure: RIGHT TOTAL KNEE REVISION;  Surgeon: Ollen Gross, MD;  Location: [facility];  Service: Orthopedics;  Laterality: Right;  Adductor Block   URETEROSCOPIC STONE EXTRACTIONS     X3    Allergies  Allergen Reactions    Deltasone [Prednisone] Other (See Comments)    Puts her into a coma. Was admitted to ICU for it.   Fentanyl Nausea And Vomiting and Other (See Comments)    Pt prefers not to have this drug. "Makes her a different person"  Makes PT vomit (severe)   Methylprednisolone Sodium Succ Shortness Of Breath and Other (See Comments)    Sob, Ams    Solu-Medrol [Methylprednisolone] Shortness Of Breath and Other (See Comments)    Sob, Ams   Corticosteroids    Septra Ds [Sulfamethoxazole-Trimethoprim] Other (See Comments)    Unknown   Dilaudid [Hydromorphone Hcl] Other (See Comments)    Hallucinations   Zolpidem Tartrate Other (See Comments)    hallucinations    Social History   Tobacco Use   Smoking status: Never   Smokeless tobacco: Never  Substance Use Topics   Alcohol use: No    Family History  Problem Relation Age of Onset   Constipation Mother    Cancer Father    Fibromyalgia Sister    Prior to Admission medications   Medication Sig Start Date End Date Taking? Authorizing Provider  albuterol (PROVENTIL HFA;VENTOLIN HFA) 108 (90 BASE) MCG/ACT inhaler Inhale 1 puff into the lungs every 4 (four) hours as needed for wheezing or shortness of breath.   Yes [provider]  ALPRAZolam Prudy Feeler) 1 MG tablet Take 1 mg by mouth 3 (three) times daily.   Yes [provider]  azelastine (ASTELIN) 0.1 % nasal spray Place 1 spray into both nostrils 2 (two) times daily as needed for rhinitis. Use in each nostril as directed   Yes [provider]  buPROPion (WELLBUTRIN XL) 300 MG 24 hr tablet Take 300 mg by mouth every morning.   Yes [provider]  diclofenac sodium (VOLTAREN) 1 % GEL Apply 1 application topically 4 (four) times daily as needed (for pain).    Yes [provider]  DULoxetine (CYMBALTA) 60 MG capsule Take 120 mg by mouth daily.   Yes [provider]  fluticasone (FLONASE) 50 MCG/ACT nasal spray Place 1 spray into both nostrils 2  (two) times daily.   Yes [provider]  hydrochlorothiazide (HYDRODIURIL) 25 MG tablet Take 25 mg by mouth daily.    Yes [provider]  hydrocortisone-pramoxine (PROCTOFOAM-HC) rectal foam Place 1 applicator rectally 2 (two) times daily as needed for hemorrhoids.   Yes [provider]  levothyroxine (SYNTHROID) 137 MCG tablet Take 137 mcg by mouth daily before breakfast.   Yes [provider]  lipase/protease/amylase (CREON) 36000 UNITS CPEP capsule Take 36,000-72,000 Units by mouth See admin instructions. 72,000 units with meals, and 36,000 units with snacks   Yes [provider]  loratadine (CLARITIN) 10 MG tablet Take 10 mg by mouth daily.   Yes [provider]  Meth-Hyo-M Salley Hews  Phos-Ph Sal (URIBEL) 118 MG CAPS Take 118 mg by mouth 4 (four) times daily.   Yes [provider]  nitrofurantoin, macrocrystal-monohydrate, (MACROBID) 100 MG capsule Take 100 mg by mouth every evening.   Yes [provider]  NUCYNTA ER 150 MG TB12 Take 150 mg by mouth 2 (two) times daily. 04/30/23  Yes [provider]  omeprazole (PRILOSEC) 20 MG capsule Take 20 mg by mouth 2 (two) times daily before a meal. 01/25/21  Yes [provider]  Oxycodone HCl 10 MG TABS Take 10 mg by mouth 3 (three) times daily. 04/19/23  Yes [provider]  pregabalin (LYRICA) 150 MG capsule Take 150 mg by mouth 2 (two) times daily.   Yes [provider]  promethazine (PHENERGAN) 25 MG tablet Take 1 tablet (25 mg total) by mouth 3 (three) times daily as needed for nausea. 09/01/14  Yes Babish, Molli Hazard, PA-C  tamsulosin (FLOMAX) 0.4 MG CAPS capsule Take 0.4 mg by mouth daily.   Yes [provider]  ipratropium-albuterol (DUONEB) 0.5-2.5 (3) MG/3ML SOLN Take 3 mLs by nebulization every 6 (six) hours as needed (SOB/Wheezing). 12/23/20   [provider]     Review of Systems  Positive ROS: As above  All other systems  have been reviewed and were otherwise negative with the exception of those mentioned in the HPI and as above.  Objective: Vital signs in last 24 hours: Temp:  [98.1 F (36.7 C)] (P) 98.1 F (36.7 C) (03/03 0555) Pulse Rate:  [73] (P) 73 (03/03 0555) Resp:  [18] (P) 18 (03/03 0555) SpO2:  [95 %] (P) 95 % (03/03 0555) Weight:  [83.9 kg] 83.9 kg (03/03 0630) Estimated body mass index is 31.26 kg/m as calculated from the following:   Height as of this encounter: 5' 4.5" (1.638 m).   Weight as of this encounter: 83.9 kg.   General Appearance: Alert, obese Head: Normocephalic, without obvious abnormality, atraumatic Eyes: PERRL, conjunctiva/corneas clear, EOM's intact,    Ears: Normal  Throat: Normal  Neck: Supple, Back: Her flank incisions are well-healed. Lungs: Clear to auscultation bilaterally, respirations unlabored Heart: Regular rate and rhythm, no murmur, rub or gallop Abdomen: Soft, non-tender Extremities: Extremities normal, atraumatic, no cyanosis or edema Skin: unremarkable  NEUROLOGIC:   Mental status: alert and oriented,Motor Exam - grossly normal Sensory Exam - grossly normal Reflexes:  Coordination - grossly normal Gait - grossly normal Balance - grossly normal Cranial Nerves: I: smell Not tested  II: visual acuity  OS: Normal  OD: Normal   II: visual fields Full to confrontation  II: pupils Equal, round, reactive to light  III,VII: ptosis None  III,IV,VI: extraocular muscles  Full ROM  V: mastication Normal  V: facial light touch sensation  Normal  V,VII: corneal reflex  Present  VII: facial muscle function - upper  Normal  VII: facial muscle function - lower Normal  VIII: hearing Not tested  IX: soft palate elevation  Normal  IX,X: gag reflex Present  XI: trapezius strength  5/5  XI: sternocleidomastoid strength 5/5  XI: neck flexion strength  5/5  XII: tongue strength  Normal    Data Review Lab Results  Component Value Date   WBC 7.2  05/08/2023   HGB 13.6 05/08/2023   HCT 42.0 05/08/2023   MCV 83.2 05/08/2023   PLT 276 05/08/2023   Lab Results  Component Value Date   NA 135 05/08/2023   K 3.5 05/08/2023   CL 102 05/08/2023   CO2  28 05/08/2023   BUN 25 (H) 05/08/2023   CREATININE 0.89 05/08/2023   GLUCOSE 99 05/08/2023   Lab Results  Component Value Date   INR 0.92 01/04/2018    Assessment/Plan: L2-3 and L3-4 disc generation, lumbago: I have discussed the situation with the patient and her husband.  I reviewed her imaging studies with him and pointed out the abnormalities.  We have discussed the various treatment options including surgery.  I have described the surgical treatment option of a posterior L2-3 and L3-4 instrumentation and fusion.  I have shown her surgical models.  I have given her surgical pamphlet.  We have discussed the risk, benefits, alternatives, expected postoperative course, and likelihood of achieving our goals with surgery.  I have answered all the patient's questions.  She has decided proceed with surgery.   Cristi Loron 05/14/2023 7:33 AM

## 2023-05-14 NOTE — Transfer of Care (Signed)
 Immediate Anesthesia Transfer of Care Note  Patient: [name]  Procedure(s) Performed: PERCUTANEOUS LUMBAR TWO-THREE, LUMBAR THREE-FOUR INSTRUMENTATION AND FUSION (Spine Lumbar) Application of O-Arm  Patient Location: PACU  Anesthesia Type:General  Level of Consciousness: awake, drowsy, patient cooperative, and responds to stimulation  Airway & Oxygen Therapy: Patient Spontanous Breathing and Patient connected to face mask oxygen  Post-op Assessment: Report given to RN and Post -op Vital signs reviewed and stable  Post vital signs: Reviewed and stable  Last Vitals:  Vitals Value Taken Time  BP 113/64 05/14/23 1148  Temp    Pulse 71 05/14/23 1150  Resp 11 05/14/23 1150  SpO2 98 % 05/14/23 1150  Vitals shown include unfiled device data.  Last Pain:  Vitals:   05/14/23 0625  TempSrc:   PainSc: 0-No pain      Patients Stated Pain Goal: 0 (05/14/23 1610)  Complications: No notable events documented.

## 2023-05-14 NOTE — Op Note (Signed)
 Brief history: The patient is a 58 year old white female who has had chronic back pain and previous surgeries.  Her most recent surgery was an L2-3 and L3-4 XLIF.  She has had persistent back pain.  We discussed that posterior instrumentation and fusion.  She is decided proceed with the surgery.  Preoperative diagnosis: Lumbar degenerative disc disease,  lumbago;   Postoperative diagnosis: The same  Procedure: posterior segmental instrumentation from L2 to L4 with globus titanium pedicle screws and rods; posterior lateral arthrodesis at L2-3 and L3-4 with local morselized autograft bone and Zimmer DBM application of spinal navigation/O-arm.  Surgeon: Dr. Delma Officer  Asst.: Hildred Priest, NP  Anesthesia: Gen. endotracheal  Estimated blood loss: 125 cc  Drains: None  Complications: None  Description of procedure: The patient was brought to the operating room by the anesthesia team. General endotracheal anesthesia was induced. The patient was turned to the prone position on the Wilson frame. The patient's lumbosacral region was then prepared with Betadine scrub and Betadine solution. Sterile drapes were applied.  I then injected the area to be incised with Marcaine with epinephrine solution. I then used the scalpel to make an incision over the right iliac crest.  I used electrocautery to expose the iliac crest.  I inserted the reference frame into the patient's iliac crest.  We then obtained intraoperative CT scan.  I used a scalpel to make incisions over the patient's bilateral L2, L3 and L4 pedicles.  Then used the O-arm navigation to place a series of dilators over the right L2 pedicle.  I inserted the tractor/tube holder.  Using O-arm navigation I used a drill to make a small pilot hole over the L2 pedicle.  I then cannulated the pedicle navigation with the bone probe.  I removed the bone probe and probed inside the track and rule out cortical breaches.  I tapped the pedicle with his 5.5  mm tap and inserted a 6.5 x 50 mm pedicle screw into the right L2 pedicle.  We got good bony purchase.  I then repeated this procedure in analogous fashion placing a 6.5 x 50 mm pedicle screw into the bilateral L2-L3 and L4 pedicles.  We then obtained other intraoperative CT scan which demonstrated good position of the pedicle screws.  We then connected the unilateral pedicle screws with a rod which we were with the caps.  This completed the instrumentation from L2-L4 bilaterally.   We now turned our attention to the posterior lateral arthrodesis at L2-3 and L3-4. We used the high-speed drill to decorticate the facets at L2-3 and L3-4. We then applied a combination of local morselized autograft bone and Zimmer DBM over these decorticated posterior lateral structures. This completed the posterior lateral arthrodesis at L2-3 and L3-4.  We then obtained hemostasis using bipolar electrocautery. We irrigated the wound out with vashe solution.  We reapproximated patient's thoracolumbar fascia with interrupted #1 Vicryl suture. We reapproximated patient's subcutaneous tissue with interrupted 2-0 Vicryl suture. The reapproximated patient's skin with Steri-Strips and benzoin. The wound was then coated with bacitracin ointment. A sterile dressing was applied. The drapes were removed. The patient was subsequently returned to the supine position where they were extubated by the anesthesia team. He was then transported to the post anesthesia care unit in stable condition. All sponge instrument and needle counts were reportedly correct at the end of this case.

## 2023-05-14 NOTE — Progress Notes (Signed)
 Orthopedic Tech Progress Note Patient Details:  Shi Grose [facility] 01-31-1966 [id number]  Patient has BACK BRACE  PER PACU RN   Patient ID: Yeimy Brabant, female   DOB: 17-May-1965, 58 y.o.   MRN: [id number]  Donald Pore 05/14/2023, 1:10 PM

## 2023-05-14 NOTE — Plan of Care (Signed)

## 2023-05-14 NOTE — Anesthesia Procedure Notes (Signed)
 Procedure Name: Intubation Date/Time: 05/14/2023 7:50 AM  Performed by: Ayesha Rumpf, CRNAPre-anesthesia Checklist: Patient identified, Emergency Drugs available, Suction available and Patient being monitored Patient Re-evaluated:Patient Re-evaluated prior to induction Oxygen Delivery Method: Circle System Utilized Preoxygenation: Pre-oxygenation with 100% oxygen Induction Type: IV induction Ventilation: Mask ventilation without difficulty Laryngoscope Size: Mac and 3 Tube type: Oral Tube size: 7.0 mm Number of attempts: 1 Airway Equipment and Method: Stylet and Oral airway Placement Confirmation: ETT inserted through vocal cords under direct vision, positive ETCO2 and breath sounds checked- equal and bilateral Secured at: 21 cm Tube secured with: Tape Dental Injury: Teeth and Oropharynx as per pre-operative assessment

## 2023-05-15 ENCOUNTER — Encounter (HOSPITAL_COMMUNITY): Payer: Self-pay | Admitting: Neurosurgery

## 2023-05-15 MED ORDER — HYDROXYZINE HCL 50 MG/ML IM SOLN
50.0000 mg | Freq: Four times a day (QID) | INTRAMUSCULAR | Status: DC | PRN
  Administered 2023-05-15: 50 mg via INTRAMUSCULAR
  Filled 2023-05-15: qty 1

## 2023-05-15 MED ORDER — OXYCODONE HCL 15 MG PO TABS: 15.0000 mg | ORAL_TABLET | ORAL | 0 refills | Status: AC | PRN

## 2023-05-15 MED ORDER — CYCLOBENZAPRINE HCL 10 MG PO TABS: 10.0000 mg | ORAL_TABLET | Freq: Three times a day (TID) | ORAL | 0 refills | Status: AC | PRN

## 2023-05-15 MED ORDER — DOCUSATE SODIUM 100 MG PO CAPS: 100.0000 mg | ORAL_CAPSULE | Freq: Two times a day (BID) | ORAL | 0 refills | Status: AC

## 2023-05-15 NOTE — Anesthesia Postprocedure Evaluation (Signed)
 Anesthesia Post Note  Patient: [name]  Procedure(s) Performed: PERCUTANEOUS LUMBAR TWO-THREE, LUMBAR THREE-FOUR INSTRUMENTATION AND FUSION (Spine Lumbar) Application of O-Arm     Patient location during evaluation: PACU Anesthesia Type: General Level of consciousness: awake and alert Pain management: pain level controlled Vital Signs Assessment: post-procedure vital signs reviewed and stable Respiratory status: spontaneous breathing, nonlabored ventilation and respiratory function stable Cardiovascular status: blood pressure returned to baseline and stable Postop Assessment: no apparent nausea or vomiting Anesthetic complications: no   No notable events documented.  Last Vitals:    Last Pain:                 Collene Schlichter

## 2023-05-15 NOTE — Evaluation (Signed)
 Occupational Therapy Evaluation Patient Details Name: [name] MRN: [id number] DOB: [birth year] Today's Date: 05/15/2023   History of Present Illness   Pt is a 58 y/o female who presents s/p posterior segmental instrumentation L2-L4 with pedicle screws and rods; posterior lateral arthrodesis at L2-L4 with local morselized autograft bone on 05/14/2023. 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     Clinical Impressions Pt evaluated s/p admission list above. At baseline, pt lives at home with her husband and was MOD I to complete all ADL/IADLs and functional mobility. Pt was limited by deficits listed below. Pt educated on compensatory strategies, precautions, tub/shower transfer, and safe use of DME during functional mobility. Overall, pt required up to MIN A for all aspects of functional mobility tasks. Pt unable to achieve figure 4 to complete lower body ADLs. Based on evaluation, pt will require up to MOD A for ADLs. Pt husband present and reports he will assist with ADLs as needed and transfers in/out of tub/shower. Pt does not have acute OT needs. Recommends pt discharge home with family support.      If plan is discharge home, recommend the following:   A little help with walking and/or transfers;A little help with bathing/dressing/bathroom;Assistance with cooking/housework;Assist for transportation;Help with stairs or ramp for entrance     Functional Status Assessment   Patient has had a recent decline in their functional status and demonstrates the ability to make significant improvements in function in a reasonable and predictable amount of time.     Equipment Recommendations   None recommended by OT     Recommendations for Other Services         Precautions/Restrictions   Precautions Precautions: Fall;Back Precaution Booklet Issued: Yes (comment) Recall of  Precautions/Restrictions: Intact Precaution/Restrictions Comments: Reviewed precautions and handout provided by PT Required Braces or Orthoses: Spinal Brace Spinal Brace: Lumbar corset;Applied in sitting position Restrictions Weight Bearing Restrictions Per Provider Order: No     Mobility Bed Mobility Overal bed mobility: Needs Assistance Bed Mobility: Rolling, Sidelying to Sit, Sit to Sidelying Rolling: Contact guard assist Sidelying to sit: Contact guard assist     Sit to sidelying: Min assist General bed mobility comments: Verbal cues provided for sequencing of log roll technique.    Transfers Overall transfer level: Needs assistance Equipment used: Rolling walker (2 wheels) Transfers: Sit to/from Stand Sit to Stand: Contact guard assist           General transfer comment: good carryover for B hand placement from PT evaluation. CGA provided for safety      Balance Overall balance assessment: Needs assistance Sitting-balance support: Bilateral upper extremity supported, Feet supported Sitting balance-Leahy Scale: Fair Sitting balance - Comments: static sitting EOB   Standing balance support: Single extremity supported, During functional activity, Reliant on assistive device for balance Standing balance-Leahy Scale: Poor Standing balance comment: Pt engaged in clothing management while standing unilaterally at RW                           ADL either performed or assessed with clinical judgement   ADL Overall ADL's : Needs assistance/impaired Eating/Feeding: Independent   Grooming: Standing;Cueing for safety;Contact guard assist   Upper Body Bathing: Supervision/ safety;Sitting   Lower Body Bathing: Minimal assistance;Sit to/from stand;Sitting/lateral leans;Adhering to back precautions   Upper Body Dressing : Supervision/safety;Sitting   Lower Body Dressing: Moderate assistance;Sit to/from  stand;Sitting/lateral leans   Toilet Transfer: Contact  guard assist;Ambulation;Regular Toilet;Rolling walker (2 wheels)   Toileting- Clothing Manipulation and Hygiene: Minimal assistance;Sitting/lateral lean;Sit to/from stand   Tub/ Shower Transfer: Ambulation;Rolling walker (2 wheels);Contact guard assist Tub/Shower Transfer Details (indicate cue type and reason): simulated stepping into tub/shower using RW. CGA for safety and verbal cues for sequencing. Functional mobility during ADLs: Contact guard assist;Rolling walker (2 wheels) General ADL Comments: CGA and verbal cues for sequencing provided for safety when performing functional transfers to commode and EOB. Pt unable to achieve figure 4 to complete lower body dressing tasks. Husband assisted during eval. Verbal cues provided to adhere to back precautions when performing clothing management task.     Vision Baseline Vision/History: 0 No visual deficits Ability to See in Adequate Light: 0 Adequate Patient Visual Report: No change from baseline Vision Assessment?: No apparent visual deficits     Perception Perception: Within Functional Limits       Praxis Praxis: Not tested       Pertinent Vitals/Pain Pain Assessment Pain Assessment: Faces Faces Pain Scale: Hurts even more Pain Location: Incision site, groin (bilaterally) Pain Descriptors / Indicators: Operative site guarding, Sore Pain Intervention(s): Monitored during session, Limited activity within patient's tolerance     Extremity/Trunk Assessment Upper Extremity Assessment Upper Extremity Assessment: Overall WFL for tasks assessed   Lower Extremity Assessment Lower Extremity Assessment: Defer to PT evaluation   Cervical / Trunk Assessment Cervical / Trunk Assessment: Back Surgery   Communication Communication Communication: No apparent difficulties   Cognition Arousal: Alert Behavior During Therapy: WFL for tasks assessed/performed Cognition: No apparent impairments                                Following commands: Intact       Cueing  General Comments   Cueing Techniques: Verbal cues;Tactile cues  VSS on RA   Exercises     Shoulder Instructions      Home Living Family/patient expects to be discharged to:: Private residence Living Arrangements: Spouse/significant other Available Help at Discharge: Family;Available 24 hours/day Type of Home: House Home Access: Stairs to enter [employer] of Steps: 5 Entrance Stairs-Rails: Left Home Layout: One level     Bathroom Shower/Tub: Producer, television/film/video: Standard Bathroom Accessibility: Yes   Home Equipment: Shower seat;Adaptive equipment;Rollator (4 wheels);Rolling Walker (2 wheels) Adaptive Equipment: Reacher        Prior Functioning/Environment Prior Level of Function : Independent/Modified Independent                    OT Problem List: Decreased activity tolerance;Impaired balance (sitting and/or standing);Decreased safety awareness;Decreased knowledge of use of DME or AE;Decreased knowledge of precautions   OT Treatment/Interventions:        OT Goals(Current goals can be found in the care plan section)   Acute Rehab OT Goals Patient Stated Goal: to go home OT Goal Formulation: With patient Time For Goal Achievement: 05/15/23 Potential to Achieve Goals: Good   OT Frequency:       Co-evaluation              AM-PAC OT "6 Clicks" Daily Activity     Outcome Measure Help from another person eating meals?: None Help from another person taking care of personal grooming?: A Little Help from another person toileting, which includes using toliet, bedpan, or urinal?: A Little Help from another person bathing (including washing,  rinsing, drying)?: A Little Help from another person to put on and taking off regular upper body clothing?: A Little Help from another person to put on and taking off regular lower body clothing?: A Lot 6 Click Score: 18   End of Session  Equipment Utilized During Treatment: Gait belt;Rolling walker (2 wheels) Nurse Communication: Mobility status  Activity Tolerance: Patient tolerated treatment well Patient left: in bed;with call bell/phone within reach;with family/visitor present  OT Visit Diagnosis: Unsteadiness on feet (R26.81);Other abnormalities of gait and mobility (R26.89);Muscle weakness (generalized) (M62.81)                Time: 1610-9604 OT Time Calculation (min): 24 min Charges:     Kevan Ny, Darliss Cheney 05/15/2023, 11:30 AM

## 2023-05-15 NOTE — Discharge Summary (Signed)
 Physician Discharge Summary  Patient ID: [name] MRN: [id number] DOB/AGE: 1965-03-20 58 y.o.  Admit date: 05/14/2023 Discharge date: 05/15/2023  Admission Diagnoses: Lumbago, lumbar degenerative disease  Discharge Diagnoses: The same Principal Problem:   Lumbar degenerative disc disease   Discharged Condition: good  Hospital Course: I performed a posterior L2-3 and L3-4 instrumentation and fusion on patient on 05/14/2023.  The surgery went well.  The patient's postoperative course was unremarkable.  On postoperative day #1 the patient requested discharge home.  The patient, and her sister-in-law, were given verbal and written discharge instructions.  All their questions were answered.  Consults: PT, care management Significant Diagnostic Studies: None Treatments: L2-3 and L3-4 posterior instrumentation and fusion Discharge Exam: Blood pressure 119/67, pulse 79, temperature 98.6 F (37 C), temperature source Oral, resp. rate 20, height 5' 4.5" (1.638 m), weight 83.9 kg, SpO2 100%. The patient is alert and pleasant.  She looks well.  Her strength is normal.  Disposition: Home  Discharge Instructions     Call MD for:  difficulty breathing, headache or visual disturbances   Complete by: As directed    Call MD for:  extreme fatigue   Complete by: As directed    Call MD for:  hives   Complete by: As directed    Call MD for:  persistant dizziness or light-headedness   Complete by: As directed    Call MD for:  persistant nausea and vomiting   Complete by: As directed    Call MD for:  redness, tenderness, or signs of infection (pain, swelling, redness, odor or green/yellow discharge around incision site)   Complete by: As directed    Call MD for:  severe uncontrolled pain   Complete by: As directed    Call MD for:  temperature >100.4   Complete by: As directed    Diet - low sodium heart healthy   Complete by: As directed    Discharge instructions   Complete by: As  directed    Call [phone] for a followup appointment. Take a stool softener while you are using pain medications.   Driving Restrictions   Complete by: As directed    Do not drive for 2 weeks.   Increase activity slowly   Complete by: As directed    Lifting restrictions   Complete by: As directed    Do not lift more than 5 pounds. No excessive bending or twisting.   May shower / Bathe   Complete by: As directed    Remove the dressing for 3 days after surgery.  You may shower, but leave the incision alone.   Remove dressing in 48 hours   Complete by: As directed       Allergies as of 05/15/2023       Reactions   Deltasone [prednisone] Other (See Comments)   Puts her into a coma. Was admitted to ICU for it.   Fentanyl Nausea And Vomiting, Other (See Comments)   Pt prefers not to have this drug. "Makes her a different person" Makes PT vomit (severe)   Methylprednisolone Sodium Succ Shortness Of Breath, Other (See Comments)   Sob, Ams   Solu-medrol [methylprednisolone] Shortness Of Breath, Other (See Comments)   Sob, Ams   Corticosteroids    Septra Ds [sulfamethoxazole-trimethoprim] Other (See Comments)   Unknown   Dilaudid [hydromorphone Hcl] Other (See Comments)   Hallucinations   Zolpidem Tartrate Other (See Comments)   hallucinations        Medication List  STOP taking these medications    ALPRAZolam 1 MG tablet Commonly known as: XANAX       TAKE these medications    albuterol 108 (90 Base) MCG/ACT inhaler Commonly known as: VENTOLIN HFA Inhale 1 puff into the lungs every 4 (four) hours as needed for wheezing or shortness of breath.   azelastine 0.1 % nasal spray Commonly known as: ASTELIN Place 1 spray into both nostrils 2 (two) times daily as needed for rhinitis. Use in each nostril as directed   buPROPion 300 MG 24 hr tablet Commonly known as: WELLBUTRIN XL Take 300 mg by mouth every morning.   Creon 36000 UNITS Cpep capsule Generic drug:  lipase/protease/amylase Take 36,000-72,000 Units by mouth See admin instructions. 72,000 units with meals, and 36,000 units with snacks   cyclobenzaprine 10 MG tablet Commonly known as: FLEXERIL Take 1 tablet (10 mg total) by mouth 3 (three) times daily as needed for muscle spasms.   diclofenac sodium 1 % Gel Commonly known as: VOLTAREN Apply 1 application topically 4 (four) times daily as needed (for pain).   docusate sodium 100 MG capsule Commonly known as: COLACE Take 1 capsule (100 mg total) by mouth 2 (two) times daily.   DULoxetine 60 MG capsule Commonly known as: CYMBALTA Take 60 mg by mouth 2 (two) times daily.   fluticasone 50 MCG/ACT nasal spray Commonly known as: FLONASE Place 1 spray into both nostrils 2 (two) times daily.   hydrochlorothiazide 25 MG tablet Commonly known as: HYDRODIURIL Take 25 mg by mouth daily.   hydrocortisone-pramoxine rectal foam Commonly known as: PROCTOFOAM-HC Place 1 applicator rectally 2 (two) times daily as needed for hemorrhoids.   ipratropium-albuterol 0.5-2.5 (3) MG/3ML Soln Commonly known as: DUONEB Take 3 mLs by nebulization every 6 (six) hours as needed (SOB/Wheezing).   levothyroxine 137 MCG tablet Commonly known as: SYNTHROID Take 137 mcg by mouth daily before breakfast.   loratadine 10 MG tablet Commonly known as: CLARITIN Take 10 mg by mouth daily.   nitrofurantoin (macrocrystal-monohydrate) 100 MG capsule Commonly known as: MACROBID Take 100 mg by mouth every evening.   Nucynta ER 150 MG Tb12 Generic drug: Tapentadol HCl Take 150 mg by mouth 2 (two) times daily.   omeprazole 20 MG capsule Commonly known as: PRILOSEC Take 20 mg by mouth 2 (two) times daily before a meal.   oxyCODONE 15 MG immediate release tablet Commonly known as: ROXICODONE Take 1 tablet (15 mg total) by mouth every 4 (four) hours as needed (moderate pain). What changed:  medication strength how much to take when to take this reasons to  take this   pregabalin 150 MG capsule Commonly known as: LYRICA Take 150 mg by mouth 2 (two) times daily.   promethazine 25 MG tablet Commonly known as: PHENERGAN Take 1 tablet (25 mg total) by mouth 3 (three) times daily as needed for nausea.   tamsulosin 0.4 MG Caps capsule Commonly known as: FLOMAX Take 0.4 mg by mouth daily.   Uribel 118 MG Caps Take 118 mg by mouth 4 (four) times daily.         Signed: Cristi Loron 05/15/2023, 7:53 AM

## 2023-05-15 NOTE — Evaluation (Signed)
 Physical Therapy Evaluation  Patient Details Name: [name] MRN: [id number] DOB: 1966/01/31 Today's Date: 05/15/2023  History of Present Illness  Pt is a 58 y/o female who presents s/p posterior segmental instrumentation L2-L4 with pedicle screws and rods; posterior lateral arthrodesis at L2-L4 with local morselized autograft bone on 05/14/2023. 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   Clinical Impression  Pt admitted with above diagnosis. At the time of PT eval, pt was able to demonstrate transfers and ambulation with gross min assist to CGA and RW for support. Pt was educated on precautions, brace application/wearing schedule, appropriate activity progression, and car transfer. Pt currently with functional limitations due to the deficits listed below (see PT Problem List). Pt will benefit from skilled PT to increase their independence and safety with mobility to allow discharge to the venue listed below.          If plan is discharge home, recommend the following: A little help with walking and/or transfers;A little help with bathing/dressing/bathroom;Assistance with cooking/housework;Assist for transportation;Help with stairs or ramp for entrance   Can travel by private vehicle        Equipment Recommendations Rolling walker (2 wheels)  Recommendations for Other Services       Functional Status Assessment Patient has had a recent decline in their functional status and demonstrates the ability to make significant improvements in function in a reasonable and predictable amount of time.     Precautions / Restrictions Precautions Precautions: Fall;Back Precaution Booklet Issued: Yes (comment) Recall of Precautions/Restrictions: Intact Precaution/Restrictions Comments: Reviewed handout and pt was cued for precautions during functional mobility. Required Braces or Orthoses:  Spinal Brace Spinal Brace: Lumbar corset;Applied in sitting position Restrictions Weight Bearing Restrictions Per Provider Order: No      Mobility  Bed Mobility Overal bed mobility: Needs Assistance Bed Mobility: Rolling, Sidelying to Sit Rolling: Min assist Sidelying to sit: Min assist       General bed mobility comments: Hands on assist to guide pt through log roll technique. HOB flat and rails lowered to simulate home environment.    Transfers Overall transfer level: Needs assistance Equipment used: Rolling walker (2 wheels) Transfers: Sit to/from Stand Sit to Stand: Contact guard assist           General transfer comment: VC's for wide BOS and hand placement on seated surface for safety. No assist required but hands on guarding provided for safety.    Ambulation/Gait Ambulation/Gait assistance: Contact guard assist Gait Distance (Feet): 250 Feet Assistive device: Rolling walker (2 wheels) Gait Pattern/deviations: Step-through pattern, Decreased stride length, Trunk flexed Gait velocity: Decreased Gait velocity interpretation: <1.31 ft/sec, indicative of household ambulator   General Gait Details: VC's for improved posture, closer walker proximity and forward gaze. No assist required but hands on throughout for safety. Pt moving slow and guarded.  Stairs Stairs: Yes Stairs assistance: Contact guard assist Stair Management: Step to pattern, Forwards Number of Stairs: 5 General stair comments: VC's for sequencing and general safety.  Wheelchair Mobility     Tilt Bed    Modified Rankin (Stroke Patients Only)       Balance Overall balance assessment: Needs assistance Sitting-balance support: Feet supported, No upper extremity supported Sitting balance-Leahy Scale: Fair     Standing balance support: During functional activity, Reliant on assistive device for balance, Bilateral upper extremity supported Standing balance-Leahy Scale: Poor  Pertinent Vitals/Pain Pain Assessment Pain Assessment: Faces Faces Pain Scale: Hurts even more Pain Location: Incision site, groin (bilaterally) Pain Descriptors / Indicators: Operative site guarding, Sore Pain Intervention(s): Limited activity within patient's tolerance, Monitored during session, Repositioned    Home Living Family/patient expects to be discharged to:: Private residence Living Arrangements: Spouse/significant other Available Help at Discharge: Family;Available 24 hours/day Type of Home: House Home Access: Stairs to enter Entrance Stairs-Rails: Left (Pt reports railing needs to be fixed) Entrance Stairs-Number of Steps: 5   Home Layout: One level Home Equipment: Shower seat;Adaptive equipment;Rollator (4 wheels)      Prior Function Prior Level of Function : Independent/Modified Independent                     Extremity/Trunk Assessment   Upper Extremity Assessment Upper Extremity Assessment: Defer to OT evaluation    Lower Extremity Assessment Lower Extremity Assessment: Generalized weakness (Mild; consistent with pre-op diagnosis)    Cervical / Trunk Assessment Cervical / Trunk Assessment: Back Surgery  Communication   Communication Communication: No apparent difficulties    Cognition Arousal: Alert Behavior During Therapy: WFL for tasks assessed/performed   PT - Cognitive impairments: No apparent impairments                         Following commands: Intact       Cueing Cueing Techniques: Verbal cues, Gestural cues     General Comments      Exercises     Assessment/Plan    PT Assessment Patient needs continued PT services  PT Problem List Decreased strength;Decreased activity tolerance;Decreased balance;Decreased mobility;Decreased knowledge of use of DME;Decreased safety awareness;Decreased knowledge of precautions;Pain       PT Treatment Interventions Gait training;DME  instruction;Stair training;Functional mobility training;Therapeutic activities;Therapeutic exercise;Balance training;Patient/family education    PT Goals (Current goals can be found in the Care Plan section)  Acute Rehab PT Goals Patient Stated Goal: Home at d/c PT Goal Formulation: With patient/family Time For Goal Achievement: 05/22/23 Potential to Achieve Goals: Good    Frequency Min 5X/week     Co-evaluation               AM-PAC PT "6 Clicks" Mobility  Outcome Measure Help needed turning from your back to your side while in a flat bed without using bedrails?: A Little Help needed moving from lying on your back to sitting on the side of a flat bed without using bedrails?: A Little Help needed moving to and from a bed to a chair (including a wheelchair)?: A Little Help needed standing up from a chair using your arms (e.g., wheelchair or bedside chair)?: A Little Help needed to walk in hospital room?: A Little Help needed climbing 3-5 steps with a railing? : A Little 6 Click Score: 18    End of Session Equipment Utilized During Treatment: Back brace Activity Tolerance: Patient tolerated treatment well Patient left: in bed;with call bell/phone within reach Nurse Communication: Mobility status PT Visit Diagnosis: Unsteadiness on feet (R26.81);Pain Pain - part of body:  (back)    Time: 9528-4132 PT Time Calculation (min) (ACUTE ONLY): 36 min   Charges:   PT Evaluation $PT Eval Low Complexity: 1 Low PT Treatments $Gait Training: 8-22 mins PT General Charges $$ ACUTE PT VISIT: 1 Visit         Conni Slipper, PT, DPT Acute Rehabilitation Services Secure Chat Preferred Office: [phone]   Marylynn Pearson 05/15/2023, 10:44 AM

## 2023-05-15 NOTE — Progress Notes (Signed)
 Patient in no acute distress nor complaints of pain nor discomfort; incision on back is clean, dry and intact; No c/o pain at this time. Room was checked and accounted for all patient's belongings; discharge instructions concerning her medications, incision care, follow up appointment and when to call the doctor as needed were all discussed with patient by RN and she expressed understanding on the instructions given.

## 2023-05-15 NOTE — Plan of Care (Signed)

## 2023-06-05 DIAGNOSIS — M48061 Spinal stenosis, lumbar region without neurogenic claudication: Secondary | ICD-10-CM | POA: Diagnosis not present

## 2023-06-14 DIAGNOSIS — M47817 Spondylosis without myelopathy or radiculopathy, lumbosacral region: Secondary | ICD-10-CM | POA: Diagnosis not present

## 2023-06-14 DIAGNOSIS — G894 Chronic pain syndrome: Secondary | ICD-10-CM | POA: Diagnosis not present

## 2023-06-14 DIAGNOSIS — M47812 Spondylosis without myelopathy or radiculopathy, cervical region: Secondary | ICD-10-CM | POA: Diagnosis not present

## 2023-06-14 DIAGNOSIS — M961 Postlaminectomy syndrome, not elsewhere classified: Secondary | ICD-10-CM | POA: Diagnosis not present

## 2023-07-19 DIAGNOSIS — K5900 Constipation, unspecified: Secondary | ICD-10-CM | POA: Diagnosis not present

## 2023-07-19 DIAGNOSIS — R109 Unspecified abdominal pain: Secondary | ICD-10-CM | POA: Diagnosis not present

## 2023-08-27 DIAGNOSIS — Z79891 Long term (current) use of opiate analgesic: Secondary | ICD-10-CM | POA: Diagnosis not present

## 2023-08-27 DIAGNOSIS — M961 Postlaminectomy syndrome, not elsewhere classified: Secondary | ICD-10-CM | POA: Diagnosis not present

## 2023-08-27 DIAGNOSIS — M47812 Spondylosis without myelopathy or radiculopathy, cervical region: Secondary | ICD-10-CM | POA: Diagnosis not present

## 2023-08-27 DIAGNOSIS — G894 Chronic pain syndrome: Secondary | ICD-10-CM | POA: Diagnosis not present

## 2023-08-27 DIAGNOSIS — M47817 Spondylosis without myelopathy or radiculopathy, lumbosacral region: Secondary | ICD-10-CM | POA: Diagnosis not present

## 2023-09-04 DIAGNOSIS — M48061 Spinal stenosis, lumbar region without neurogenic claudication: Secondary | ICD-10-CM | POA: Diagnosis not present

## 2023-09-04 DIAGNOSIS — Z6832 Body mass index (BMI) 32.0-32.9, adult: Secondary | ICD-10-CM | POA: Diagnosis not present

## 2023-10-04 DIAGNOSIS — J302 Other seasonal allergic rhinitis: Secondary | ICD-10-CM | POA: Diagnosis not present

## 2023-10-04 DIAGNOSIS — E782 Mixed hyperlipidemia: Secondary | ICD-10-CM | POA: Diagnosis not present

## 2023-10-04 DIAGNOSIS — J4520 Mild intermittent asthma, uncomplicated: Secondary | ICD-10-CM | POA: Diagnosis not present

## 2023-10-04 DIAGNOSIS — E039 Hypothyroidism, unspecified: Secondary | ICD-10-CM | POA: Diagnosis not present

## 2023-10-04 DIAGNOSIS — F331 Major depressive disorder, recurrent, moderate: Secondary | ICD-10-CM | POA: Diagnosis not present

## 2023-10-04 DIAGNOSIS — Z0000 Encounter for general adult medical examination without abnormal findings: Secondary | ICD-10-CM | POA: Diagnosis not present

## 2023-10-04 DIAGNOSIS — Z79899 Other long term (current) drug therapy: Secondary | ICD-10-CM | POA: Diagnosis not present

## 2023-10-04 DIAGNOSIS — M85851 Other specified disorders of bone density and structure, right thigh: Secondary | ICD-10-CM | POA: Diagnosis not present

## 2023-10-23 DIAGNOSIS — M47817 Spondylosis without myelopathy or radiculopathy, lumbosacral region: Secondary | ICD-10-CM | POA: Diagnosis not present

## 2023-10-23 DIAGNOSIS — M961 Postlaminectomy syndrome, not elsewhere classified: Secondary | ICD-10-CM | POA: Diagnosis not present

## 2023-10-23 DIAGNOSIS — G894 Chronic pain syndrome: Secondary | ICD-10-CM | POA: Diagnosis not present

## 2023-10-23 DIAGNOSIS — M47812 Spondylosis without myelopathy or radiculopathy, cervical region: Secondary | ICD-10-CM | POA: Diagnosis not present

## 2023-12-06 DIAGNOSIS — M961 Postlaminectomy syndrome, not elsewhere classified: Secondary | ICD-10-CM | POA: Diagnosis not present

## 2023-12-06 DIAGNOSIS — M47812 Spondylosis without myelopathy or radiculopathy, cervical region: Secondary | ICD-10-CM | POA: Diagnosis not present

## 2023-12-06 DIAGNOSIS — M47817 Spondylosis without myelopathy or radiculopathy, lumbosacral region: Secondary | ICD-10-CM | POA: Diagnosis not present

## 2023-12-06 DIAGNOSIS — G894 Chronic pain syndrome: Secondary | ICD-10-CM | POA: Diagnosis not present

## 2024-01-31 DIAGNOSIS — M47812 Spondylosis without myelopathy or radiculopathy, cervical region: Secondary | ICD-10-CM | POA: Diagnosis not present

## 2024-01-31 DIAGNOSIS — M47817 Spondylosis without myelopathy or radiculopathy, lumbosacral region: Secondary | ICD-10-CM | POA: Diagnosis not present

## 2024-01-31 DIAGNOSIS — M961 Postlaminectomy syndrome, not elsewhere classified: Secondary | ICD-10-CM | POA: Diagnosis not present

## 2024-01-31 DIAGNOSIS — G894 Chronic pain syndrome: Secondary | ICD-10-CM | POA: Diagnosis not present

## 2024-02-19 DIAGNOSIS — Z1231 Encounter for screening mammogram for malignant neoplasm of breast: Secondary | ICD-10-CM | POA: Diagnosis not present

## 2024-02-22 DIAGNOSIS — M48061 Spinal stenosis, lumbar region without neurogenic claudication: Secondary | ICD-10-CM | POA: Diagnosis not present
